# Patient Record
Sex: Female | Born: 1950 | Race: White | Hispanic: No | Marital: Married | Smoking: Former smoker
Health system: Southern US, Community
[De-identification: ages and names within clinical notes are randomized; demographics above are authoritative.]

## PROBLEM LIST (undated history)

## (undated) DIAGNOSIS — G43909 Migraine, unspecified, not intractable, without status migrainosus: Secondary | ICD-10-CM

## (undated) DIAGNOSIS — I10 Essential (primary) hypertension: Secondary | ICD-10-CM

## (undated) DIAGNOSIS — K219 Gastro-esophageal reflux disease without esophagitis: Secondary | ICD-10-CM

## (undated) DIAGNOSIS — J45909 Unspecified asthma, uncomplicated: Secondary | ICD-10-CM

## (undated) DIAGNOSIS — D229 Melanocytic nevi, unspecified: Secondary | ICD-10-CM

## (undated) DIAGNOSIS — F32A Depression, unspecified: Secondary | ICD-10-CM

## (undated) DIAGNOSIS — M1990 Unspecified osteoarthritis, unspecified site: Secondary | ICD-10-CM

## (undated) DIAGNOSIS — G4700 Insomnia, unspecified: Secondary | ICD-10-CM

## (undated) HISTORY — PX: BREAST SURGERY: SHX581

## (undated) HISTORY — DX: Essential (primary) hypertension: I10

## (undated) HISTORY — PX: BACK SURGERY: SHX140

## (undated) HISTORY — PX: ABDOMINAL HYSTERECTOMY: SHX81

## (undated) HISTORY — DX: Insomnia, unspecified: G47.00

## (undated) HISTORY — PX: NECK SURGERY: SHX720

---
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
## 1998-05-16 ENCOUNTER — Ambulatory Visit (HOSPITAL_COMMUNITY): Admission: RE | Admit: 1998-05-16 | Discharge: 1998-05-16 | Payer: Self-pay | Admitting: Obstetrics and Gynecology

## 1998-11-14 ENCOUNTER — Other Ambulatory Visit: Admission: RE | Admit: 1998-11-14 | Discharge: 1998-11-14 | Payer: Self-pay | Admitting: General Surgery

## 1999-03-15 ENCOUNTER — Encounter: Payer: Self-pay | Admitting: General Surgery

## 1999-03-15 ENCOUNTER — Ambulatory Visit (HOSPITAL_COMMUNITY): Admission: RE | Admit: 1999-03-15 | Discharge: 1999-03-15 | Payer: Self-pay | Admitting: General Surgery

## 2000-01-14 ENCOUNTER — Other Ambulatory Visit: Admission: RE | Admit: 2000-01-14 | Discharge: 2000-01-14 | Payer: Self-pay | Admitting: Obstetrics and Gynecology

## 2000-05-15 ENCOUNTER — Ambulatory Visit (HOSPITAL_COMMUNITY): Admission: RE | Admit: 2000-05-15 | Discharge: 2000-05-15 | Payer: Self-pay | Admitting: Obstetrics and Gynecology

## 2000-05-15 ENCOUNTER — Encounter: Payer: Self-pay | Admitting: Obstetrics and Gynecology

## 2000-07-02 ENCOUNTER — Emergency Department (HOSPITAL_COMMUNITY): Admission: EM | Admit: 2000-07-02 | Discharge: 2000-07-02 | Payer: Self-pay | Admitting: Emergency Medicine

## 2000-07-02 ENCOUNTER — Encounter: Payer: Self-pay | Admitting: Emergency Medicine

## 2000-08-04 HISTORY — PX: REDUCTION MAMMAPLASTY: SUR839

## 2001-06-10 ENCOUNTER — Ambulatory Visit (HOSPITAL_COMMUNITY): Admission: RE | Admit: 2001-06-10 | Discharge: 2001-06-10 | Payer: Self-pay | Admitting: Obstetrics and Gynecology

## 2001-06-10 ENCOUNTER — Encounter: Payer: Self-pay | Admitting: Obstetrics and Gynecology

## 2002-05-11 ENCOUNTER — Other Ambulatory Visit: Admission: RE | Admit: 2002-05-11 | Discharge: 2002-05-11 | Payer: Self-pay | Admitting: Obstetrics and Gynecology

## 2002-06-15 ENCOUNTER — Ambulatory Visit (HOSPITAL_COMMUNITY): Admission: RE | Admit: 2002-06-15 | Discharge: 2002-06-15 | Payer: Self-pay | Admitting: Obstetrics and Gynecology

## 2002-06-15 ENCOUNTER — Encounter: Payer: Self-pay | Admitting: Obstetrics and Gynecology

## 2003-04-21 ENCOUNTER — Ambulatory Visit (HOSPITAL_BASED_OUTPATIENT_CLINIC_OR_DEPARTMENT_OTHER): Admission: RE | Admit: 2003-04-21 | Discharge: 2003-04-21 | Payer: Self-pay | Admitting: *Deleted

## 2003-04-21 ENCOUNTER — Encounter (INDEPENDENT_AMBULATORY_CARE_PROVIDER_SITE_OTHER): Payer: Self-pay | Admitting: Specialist

## 2003-06-21 ENCOUNTER — Ambulatory Visit (HOSPITAL_COMMUNITY): Admission: RE | Admit: 2003-06-21 | Discharge: 2003-06-21 | Payer: Self-pay | Admitting: Obstetrics and Gynecology

## 2003-10-04 ENCOUNTER — Emergency Department (HOSPITAL_COMMUNITY): Admission: EM | Admit: 2003-10-04 | Discharge: 2003-10-04 | Payer: Self-pay | Admitting: Emergency Medicine

## 2003-10-21 ENCOUNTER — Emergency Department (HOSPITAL_COMMUNITY): Admission: EM | Admit: 2003-10-21 | Discharge: 2003-10-22 | Payer: Self-pay | Admitting: *Deleted

## 2004-08-12 ENCOUNTER — Other Ambulatory Visit: Admission: RE | Admit: 2004-08-12 | Discharge: 2004-08-12 | Payer: Self-pay | Admitting: Obstetrics and Gynecology

## 2006-06-16 ENCOUNTER — Encounter: Admission: RE | Admit: 2006-06-16 | Discharge: 2006-06-16 | Payer: Self-pay | Admitting: Obstetrics and Gynecology

## 2007-03-04 ENCOUNTER — Encounter: Admission: RE | Admit: 2007-03-04 | Discharge: 2007-03-04 | Payer: Self-pay | Admitting: Internal Medicine

## 2007-07-12 ENCOUNTER — Ambulatory Visit (HOSPITAL_BASED_OUTPATIENT_CLINIC_OR_DEPARTMENT_OTHER): Admission: RE | Admit: 2007-07-12 | Discharge: 2007-07-13 | Payer: Self-pay | Admitting: Specialist

## 2007-07-12 ENCOUNTER — Encounter (INDEPENDENT_AMBULATORY_CARE_PROVIDER_SITE_OTHER): Payer: Self-pay | Admitting: Specialist

## 2008-07-18 ENCOUNTER — Encounter: Admission: RE | Admit: 2008-07-18 | Discharge: 2008-07-18 | Payer: Self-pay | Admitting: Surgery

## 2008-07-19 ENCOUNTER — Ambulatory Visit (HOSPITAL_BASED_OUTPATIENT_CLINIC_OR_DEPARTMENT_OTHER): Admission: RE | Admit: 2008-07-19 | Discharge: 2008-07-19 | Payer: Self-pay | Admitting: Surgery

## 2008-07-19 ENCOUNTER — Encounter (INDEPENDENT_AMBULATORY_CARE_PROVIDER_SITE_OTHER): Payer: Self-pay | Admitting: Surgery

## 2009-05-15 ENCOUNTER — Ambulatory Visit (HOSPITAL_COMMUNITY): Admission: RE | Admit: 2009-05-15 | Discharge: 2009-05-15 | Payer: Self-pay | Admitting: Obstetrics and Gynecology

## 2009-10-21 ENCOUNTER — Emergency Department (HOSPITAL_COMMUNITY): Admission: EM | Admit: 2009-10-21 | Discharge: 2009-10-21 | Payer: Self-pay | Admitting: Emergency Medicine

## 2009-11-19 DIAGNOSIS — D229 Melanocytic nevi, unspecified: Secondary | ICD-10-CM

## 2009-11-19 HISTORY — DX: Melanocytic nevi, unspecified: D22.9

## 2010-03-14 ENCOUNTER — Encounter (INDEPENDENT_AMBULATORY_CARE_PROVIDER_SITE_OTHER): Payer: Self-pay | Admitting: *Deleted

## 2010-05-08 ENCOUNTER — Emergency Department (HOSPITAL_BASED_OUTPATIENT_CLINIC_OR_DEPARTMENT_OTHER): Admission: EM | Admit: 2010-05-08 | Discharge: 2010-05-08 | Payer: Self-pay | Admitting: Emergency Medicine

## 2010-05-08 ENCOUNTER — Ambulatory Visit: Payer: Self-pay | Admitting: Diagnostic Radiology

## 2010-05-30 ENCOUNTER — Encounter: Admission: RE | Admit: 2010-05-30 | Discharge: 2010-05-30 | Payer: Self-pay | Admitting: Internal Medicine

## 2010-06-03 ENCOUNTER — Encounter
Admission: RE | Admit: 2010-06-03 | Discharge: 2010-07-18 | Payer: Self-pay | Source: Home / Self Care | Attending: Internal Medicine | Admitting: Internal Medicine

## 2010-07-12 ENCOUNTER — Ambulatory Visit (HOSPITAL_COMMUNITY)
Admission: RE | Admit: 2010-07-12 | Discharge: 2010-07-12 | Payer: Self-pay | Source: Home / Self Care | Attending: Obstetrics and Gynecology | Admitting: Obstetrics and Gynecology

## 2010-08-24 ENCOUNTER — Encounter: Payer: Self-pay | Admitting: Obstetrics and Gynecology

## 2010-09-05 NOTE — Letter (Signed)
Summary: Colonoscopy Letter  [facility]  [address] [place], [place] [address]   Phone: [phone]  Fax: [phone]      March 14, 2010 MRN: [id number]   CASEE KNEPP  [address] [place], [place]  [address]   Dear Ms. Oguin ,   According to your medical record, it is time for you to schedule a Colonoscopy. The American Cancer Society recommends this procedure as a method to detect early colon cancer. Patients with a family history of colon cancer, or a personal history of colon polyps or inflammatory bowel disease are at increased risk.  This letter has beeen generated based on the recommendations made at the time of your procedure. If you feel that in your particular situation this may no longer apply, please contact our office.  Please call our office at [phone] to schedule this appointment or to update your records at your earliest convenience.  Thank you for cooperating with [place] to provide you with the very best care possible.   Sincerely,  Judie Petit T. Russella Dar, M.D.  [facility] [phone]

## 2010-10-28 ENCOUNTER — Other Ambulatory Visit (HOSPITAL_BASED_OUTPATIENT_CLINIC_OR_DEPARTMENT_OTHER): Payer: Self-pay | Admitting: Internal Medicine

## 2010-10-28 DIAGNOSIS — M25562 Pain in left knee: Secondary | ICD-10-CM

## 2010-10-30 ENCOUNTER — Ambulatory Visit (HOSPITAL_BASED_OUTPATIENT_CLINIC_OR_DEPARTMENT_OTHER)
Admission: RE | Admit: 2010-10-30 | Discharge: 2010-10-30 | Disposition: A | Discharge status: Home / Self Care | Payer: BC Managed Care – PPO | Source: Ambulatory Visit | Attending: Internal Medicine | Admitting: Internal Medicine

## 2010-10-30 DIAGNOSIS — M25569 Pain in unspecified knee: Secondary | ICD-10-CM

## 2010-10-30 DIAGNOSIS — M25562 Pain in left knee: Secondary | ICD-10-CM

## 2010-12-17 NOTE — Op Note (Signed)
NAME:  Kristina Mclaughlin, Kristina Mclaughlin              ACCOUNT NO.:  [id number]   MEDICAL RECORD NO.:  [id number]          PATIENT TYPE:  AMB   LOCATION:  DSC                          FACILITY:  [facility]   PHYSICIAN:  Earvin Hansen L. Trues[place], M.D.DATE OF BIRTH:  10-23-1950   DATE OF PROCEDURE:  07/12/2007  DATE OF DISCHARGE:                               OPERATIVE REPORT   INDICATIONS FOR PROCEDURE:  This 60 year old lady with severe  macromastia, back and shoulder pain, secondary to large pendulous  breasts.  A history of inter-triginous changes under both breast areas.  The right breast seems to be larger. She has more symptoms on that side  as well.  Pitting of both shoulders.  The patient also demonstrates  accessory breast tissue on the right and left side, the right side  greater than the left, in the high axillary areas, as well as the  latissimus dorsi regions, causing increased chaffing and discomfort.   PROCEDURES PLANNED:  Bilateral breast reductions using the inferior  pedicle technique and excision of accessory breast tissue.   ANESTHESIA:  General anesthesia.   SURGEON:  Yaakov Guthrie. Shon Hough, M.D.   DESCRIPTION OF PROCEDURE:  Preoperatively the patient was set up and  drawn for the inferior pedicle reduction mammoplasty.  Remarking the  nipple/areolar complex to back to 22 cm from the suprasternal notch.  She measured over 36 on the right side and only 34 on the left.  She  underwent general anesthesia and was intubated orally.  Prep was done to  the chest, breasts and abdominal areas using Hibiclens, soap and  solution, and walled off with sterile towels and drapes, so as to make a  sterile field.  The areas were injected with Xylocaine 0.25%, 1:400,000  concentration, a total of 150 mL per side.  This was allowed to set up.  The wounds were scored with a #15 blade and then the skin over the  inferior pedicle was de-epithelialized with #20 blades.  Medial and  lateral fatty dermal pedicles  were incised down to the underlying  pectoralis major fascia.  Hemostasis maintained again with the Bovie  unit on coagulation.  A new key hole area was also debulked and also  accessory breast tissue and lateral breast tissue was removed sharply  using a Bovie unit on coagulation.  Hemostasis again was maintained with  the same apparatus.  More accessory breast tissue was taken deep in the  axillary area and the latissimus dorsi  region.  Irrigation was done  after proper hemostasis.  The flaps were transposed and stayed with #3-0  Prolene.  The subcutaneous closure was done with #3-0 Monocryl x2  layers.  Then the skin edges were reapproximated with running  subcuticular stitches of #3-0 Monocryl and #5-0 Monocryl throughout the  inverted T.  At the end of the procedure the nipple/areolar complex was  re-examined and showed good suppleness and excellent return and  symmetry.  The wounds were cleansed.  Steri-Strips and a soft dressing  were applied, 4 x 4's, ABDs and Hypafix tape.   She was then taken to the recovery room in  excellent condition.  The  estimated blood loss was less than 150 mL.  Complications:  None.      Yaakov Guthrie. Shon Hough, M.D.  Electronically Signed     GLT/MEDQ  D:  07/12/2007  T:  07/12/2007  Job:  [id number]

## 2010-12-17 NOTE — Op Note (Signed)
NAME:  Kristina Mclaughlin, Kristina Mclaughlin              ACCOUNT NO.:  [id number]   MEDICAL RECORD NO.:  [id number]          PATIENT TYPE:  AMB   LOCATION:  DSC                          FACILITY:  [facility]   PHYSICIAN:  Thomas A. Cornett, M.D.DATE OF BIRTH:  09-Sep-1950   DATE OF PROCEDURE:  07/19/2008  DATE OF DISCHARGE:                               OPERATIVE REPORT   PREOPERATIVE DIAGNOSIS:  1. Mass, left mid back.  2. Mass, right lower leg.   POSTOPERATIVE DIAGNOSIS:  1. Lipoma, left back measuring 6 x 6 cm.  2. Lipoma, right leg measuring 3 x 4 cm.   PROCEDURE:  1. Excision of mass, left back.  2. Excision of mass, right leg.   SURGEON:  Maisie Fus A. Cornett, MD   ANESTHESIA:  General endotracheal anesthesia with 0.25% Sensorcaine  local.   ESTIMATED BLOOD LOSS:  20 mL.   SPECIMEN:  Tissue as stated above.   DRAINS:  None.   INDICATIONS FOR PROCEDURE:  The patient is a 60 year old female with a  history of lipoma.  She has had multiple lipoma removed, but now has one  on her left mid back, which is quite large and uncomfortable and one on  her right leg just below her knee over the medial aspect of the right  lower extremity.  Both are causing discomfort and she wished to have  them removed based on that.  She presents today for that.   DESCRIPTION OF PROCEDURE:  The patient was brought to the operating room  where she was placed supine initially.  She was intubated and placed  prone to get to the back lesion.  The left back was then prepped and  draped in sterile fashion after putting the patient prone with  appropriate padding.  She received appropriate preop antibiotics.  An  incision was made over the left mid back.  The mass actually was bigger  than what it felt on physical examination.  It was a large fatty lobular  mass and all the way down to the musculature of the left upper back.  Roughly, a 6 x 6 cm mass was removed which had the appearance of a  simple lipoma.  This was taken  all the way down to the fascia of the  latissimus dorsi muscle.  This was passed off the field.  The wound was  irrigated and found to be hemostatic.  I used a deep layer of 3-0 Vicryl  to attach the skin down to the muscle.  A 4-0 Monocryl was then used to  close the skin in a subcuticular fashion.  Dermabond was applied.  This  was allowed to dry.  She was then placed supine and placed on a  stretcher.  The right lower extremity was then appropriately padded, and  the lesion that was on the medial aspect of her right lower leg just  approximately halfway between her knee and ankle was identified.  This  was prepped and draped in a sterile fashion.  This was much smaller and  felt more to be 3 x 3 cm.  A vertical incision over it  was placed.  We  immediately got into a lobular fatty mass consisting of lipoma and I  removed this.  There, I felt around it, I felt no other mass.  Her fat  had a lobular feel to it around this, but there was no discrete mass or  encapsulated mass consistent with lipoma besides the area we took out.  This came out in a piecemeal fashion.  I did not encounter the saphenous  vein here or the saphenous nerve.  We then irrigated this area out and  closed it with a 3-0 Vicryl and subsequently 4-0 Monocryl.  Dermabond  was applied.  The specimens were sent to pathology but appeared to be  simple lipomas.  The patient was then awakened, final counts were found  to be correct, and she was then taken to recovery in satisfactory  condition.      Thomas A. Cornett, M.D.  Electronically Signed     TAC/MEDQ  D:  07/19/2008  T:  07/19/2008  Job:  [id number]

## 2010-12-20 NOTE — Op Note (Signed)
NAME:  Kristina Mclaughlin, Kristina Mclaughlin                        ACCOUNT NO.:  [id number]   MEDICAL RECORD NO.:  [id number]                   PATIENT TYPE:  AMB   LOCATION:  DSC                                  FACILITY:  [facility]   PHYSICIAN:  Kathy Breach, M.D.                   DATE OF BIRTH:  1951/05/25   DATE OF PROCEDURE:  04/21/2003  DATE OF DISCHARGE:                                 OPERATIVE REPORT   PREOPERATIVE DIAGNOSES:  1. Severe deviation of the nasal septum.  2. Compensatory hyperplasia of the inferior turbinates.   PROCEDURE:  1. Nasoseptoplasty.  2. Bilateral submucous resection of the inferior turbinates.   POSTOPERATIVE DIAGNOSES:  1. Severe deviation of the nasal septum.  2. Compensatory hyperplasia of the inferior turbinates.   SURGEON:  Kathy Breach, M.D.   ANESTHESIA:  General orotracheal.   DESCRIPTION OF PROCEDURE:  With the patient under general orotracheal  anesthesia, his nose was prepped and draped in a sterile fashion. Nasal  block anesthesia was applied with 4% Xylocaine/ephedrine solution on olive-  tipped probes applied to the sphenopalatine and anterior ethmoid nerve areas  bilaterally. Cotton pledgets soaked with a similar solution inserted along  the middle and inferior turbinates.  The columnella, nasal septum, and later  in the procedure, inferior turbinates infiltrated with 1% Xylocaine with  1:100,000 epinephrine for further vasoconstrictive effort.   Examination of the patient's nose after decongestant revealed 4+ deflection  of the maxillary crest inferior quadrilateral cartilage to the left  obstructing the visualization of the inferior turbinate extending into the  vomer ridge spur on the left further posteriorly. There was marked  thickening and widening evident of the junction of the quadrilateral  cartilage with the perpendicular plate of the ethmoid bilaterally. The  patient had marked compensatory hyperplasia of the right middle and inferior  turbinates.   A caudal incision was made inside the left nasal vestibule. Mucosal flaps  were elevated off the left side of the cartilaginous bony septum with some  difficulty in duplication. The cartilage was incised anterior to the  junction with the perpendicular ethmoid plate because of marked deviation  and mucosal flaps elevated on the opposite right side of all the nasal  septum except for the anteroseptal strut of the quadrilateral cartilage. The  posterior superior quadrilateral cartilage along with the deflected and very  widened perpendicular plate of the ethmoid was removed and inferiorly the  posterior aspect of the quadrilateral cartilage deflection and dislocated  off the maxillary crest was removed.   The sharply deflected maxillary crest to the left was taken down and  removed, and the vomer ridge spur to the left further posteriorly was  removed. This brought the nasal septum into a near midline position. The  left turbinates could then be visualized after straightening the marked  deflection and the posterior two-thirds of the inferior turbinate on the  left side were hyperplastic as well as a very large hyperplastic prominent  middle turbinate.   A stab incision was made over the anterior aspect of the left inferior  turbinate and a superior based mucosal flap elevated off the turbinate bone,  the prominence of turbinate bone more posteriorly, the lower half along with  a patched free margin and inferior meatal mucosa was excised with angled  scissors. Suction cautery was used to obtain complete hemostasis along the  bony mucosal margins. Two-thirds of the posterior extension were removed and  ablated with cautery as well.   A similar procedure was done on the right side. The remaining inferior  turbinate bones were out-fractured bilaterally. The very prominent middle  turbinates were gently out-fractured bilaterally. This gave good visible  airway bilaterally. The  incision was then closed with interrupted 4-0  chromic catgut sutures. The nose was packed with Vaseline gauze impregnated  with bacitracin ointment bilaterally. The patient's nasopharynx and oral  cavity were suctioned clear. There was no active bleeding in the  nasopharynx. Blood loss during the procedure was 100 mL as measured in the  suction cannister.   The patient tolerated the procedure well and was taken to the recovery room  in stable general condition.                                               Kathy Breach, M.D.    Venia Minks  D:  04/21/2003  T:  04/22/2003  Job:  [id number]

## 2011-04-15 ENCOUNTER — Other Ambulatory Visit (HOSPITAL_COMMUNITY): Payer: Self-pay | Admitting: Obstetrics and Gynecology

## 2011-04-15 DIAGNOSIS — Z1231 Encounter for screening mammogram for malignant neoplasm of breast: Secondary | ICD-10-CM

## 2011-05-09 LAB — BASIC METABOLIC PANEL
BUN: 14 mg/dL (ref 6–23)
CO2: 29 mEq/L (ref 19–32)
Calcium: 9.8 mg/dL (ref 8.4–10.5)
Chloride: 103 mEq/L (ref 96–112)
Creatinine, Ser: 0.72 mg/dL (ref 0.4–1.2)
GFR calc Af Amer: >60 mL/min (ref >60)
GFR calc non Af Amer: >60 mL/min (ref >60)
Glucose, Bld: 107 mg/dL — ABNORMAL HIGH (ref 70–99)
Potassium: 4 mEq/L (ref 3.5–5.1)
Sodium: 139 mEq/L (ref 135–145)

## 2011-05-09 LAB — DIFFERENTIAL
Basophils Absolute: 0 10*3/uL (ref 0.0–0.1)
Basophils Relative: 1 % (ref 0–1)
Eosinophils Absolute: 0.2 10*3/uL (ref 0.0–0.7)
Eosinophils Relative: 3 % (ref 0–5)
Lymphocytes Relative: 49 % — ABNORMAL HIGH (ref 12–46)
Lymphs Abs: 3 10*3/uL (ref 0.7–4.0)
Monocytes Absolute: 0.5 10*3/uL (ref 0.1–1.0)
Monocytes Relative: 9 % (ref 3–12)
Neutro Abs: 2.4 10*3/uL (ref 1.7–7.7)
Neutrophils Relative %: 39 % — ABNORMAL LOW (ref 43–77)

## 2011-05-09 LAB — CBC
HCT: 40.1 % (ref 36.0–46.0)
Hemoglobin: 13.7 g/dL (ref 12.0–15.0)
MCHC: 34.1 g/dL (ref 30.0–36.0)
MCV: 87.8 fL (ref 78.0–100.0)
Platelets: 188 10*3/uL (ref 150–400)
RBC: 4.57 MIL/uL (ref 3.87–5.11)
RDW: 13.8 % (ref 11.5–15.5)
WBC: 6.1 10*3/uL (ref 4.0–10.5)

## 2011-05-12 LAB — POCT HEMOGLOBIN-HEMACUE
Hemoglobin: 15.3 — ABNORMAL HIGH
Operator id: 12362

## 2011-05-12 LAB — BASIC METABOLIC PANEL
BUN: 16
CO2: 25
Calcium: 9.4
Chloride: 105
Creatinine, Ser: 0.93
GFR calc Af Amer: >60
GFR calc non Af Amer: >60
Glucose, Bld: 116 — ABNORMAL HIGH
Potassium: 3.8
Sodium: 137

## 2011-07-21 ENCOUNTER — Ambulatory Visit (HOSPITAL_COMMUNITY)
Admission: RE | Admit: 2011-07-21 | Discharge: 2011-07-21 | Disposition: A | Discharge status: Home / Self Care | Payer: BC Managed Care – PPO | Source: Ambulatory Visit | Attending: Obstetrics and Gynecology | Admitting: Obstetrics and Gynecology

## 2011-07-21 DIAGNOSIS — Z1231 Encounter for screening mammogram for malignant neoplasm of breast: Secondary | ICD-10-CM

## 2012-04-07 ENCOUNTER — Encounter: Payer: Self-pay | Admitting: Gastroenterology

## 2012-06-28 ENCOUNTER — Other Ambulatory Visit (HOSPITAL_COMMUNITY): Payer: Self-pay | Admitting: Obstetrics and Gynecology

## 2012-06-28 DIAGNOSIS — Z1231 Encounter for screening mammogram for malignant neoplasm of breast: Secondary | ICD-10-CM

## 2012-07-15 ENCOUNTER — Encounter (INDEPENDENT_AMBULATORY_CARE_PROVIDER_SITE_OTHER): Payer: Self-pay | Admitting: General Surgery

## 2012-07-20 ENCOUNTER — Encounter (INDEPENDENT_AMBULATORY_CARE_PROVIDER_SITE_OTHER): Payer: Self-pay | Admitting: General Surgery

## 2012-07-22 ENCOUNTER — Encounter (INDEPENDENT_AMBULATORY_CARE_PROVIDER_SITE_OTHER): Payer: Self-pay | Admitting: General Surgery

## 2012-07-22 ENCOUNTER — Ambulatory Visit (INDEPENDENT_AMBULATORY_CARE_PROVIDER_SITE_OTHER): Payer: BC Managed Care – PPO | Admitting: General Surgery

## 2012-07-22 VITALS — BP 124/78 | HR 72 | Temp 97.3°F | Resp 16 | Ht 69.5 in | Wt 226.2 lb

## 2012-07-22 DIAGNOSIS — K648 Other hemorrhoids: Secondary | ICD-10-CM

## 2012-07-22 DIAGNOSIS — K644 Residual hemorrhoidal skin tags: Secondary | ICD-10-CM

## 2012-07-22 NOTE — Patient Instructions (Signed)
Call our office to schedule surgery [phone]  GETTING TO GOOD BOWEL HEALTH. Irregular bowel habits such as constipation and diarrhea can lead to many problems over time.  Having one soft bowel movement a day is the most important way to prevent further problems.  The anorectal canal is designed to handle stretching and feces to safely manage our ability to get rid of solid waste (feces, poop, stool) out of our body.  BUT, hard constipated stools can act like ripping concrete bricks and diarrhea can be a burning fire to this very sensitive area of our body, causing inflamed hemorrhoids, anal fissures, increasing risk is perirectal abscesses, abdominal pain/bloating, an making irritable bowel worse.     The goal: ONE SOFT BOWEL MOVEMENT A DAY!  To have soft, regular bowel movements:    Drink at least 8 tall glasses of water a day.     Take plenty of fiber.  Fiber is the undigested part of plant food that passes into the colon, acting s "natures broom" to encourage bowel motility and movement.  Fiber can absorb and hold large amounts of water. This results in a larger, bulkier stool, which is soft and easier to pass. Work gradually over several weeks up to 6 servings a day of fiber (25g a day even more if needed) in the form of: o Vegetables -- Root (potatoes, carrots, turnips), leafy green (lettuce, salad greens, celery, spinach), or cooked high residue (cabbage, broccoli, etc) o Fruit -- Fresh (unpeeled skin & pulp), Dried (prunes, apricots, cherries, etc ),  or stewed ( applesauce)  o Whole grain breads, pasta, etc (whole wheat)  o Bran cereals    Bulking Agents -- This type of water-retaining fiber generally is easily obtained each day by one of the following:  o Psyllium bran -- The psyllium plant is remarkable because its ground seeds can retain so much water. This product is available as Metamucil, Konsyl, Effersyllium, Per Diem Fiber, or the less expensive generic preparation in drug and health  food stores. Although labeled a laxative, it really is not a laxative.  o Methylcellulose -- This is another fiber derived from wood which also retains water. It is available as Citrucel. o Polyethylene Glycol - and "artificial" fiber commonly called Miralax or Glycolax.  It is helpful for people with gassy or bloated feelings with regular fiber o Flax Seed - a less gassy fiber than psyllium   No reading or other relaxing activity while on the toilet. If bowel movements take longer than 5 minutes, you are too constipated   AVOID CONSTIPATION.  High fiber and water intake usually takes care of this.  Sometimes a laxative is needed to stimulate more frequent bowel movements, but    Laxatives are not a good long-term solution as it can wear the colon out. o Osmotics (Milk of Magnesia, Fleets phosphosoda, Magnesium citrate, MiraLax, GoLytely) are safer than  o Stimulants (Senokot, Castor Oil, Dulcolax, Ex Lax)    o Do not take laxatives for more than 7days in a row.    IF SEVERELY CONSTIPATED, try a Bowel Retraining Program: o Do not use laxatives.  o Eat a diet high in roughage, such as bran cereals and leafy vegetables.  o Drink six (6) ounces of prune or apricot juice each morning.  o Eat two (2) large servings of stewed fruit each day.  o Take one (1) heaping tablespoon of a psyllium-based bulking agent twice a day. Use sugar-free sweetener when possible to avoid excessive calories.  o Eat a normal breakfast.  o Set aside 15 minutes after breakfast to sit on the toilet, but do not strain to have a bowel movement.  o If you do not have a bowel movement by the third day, use an enema and repeat the above steps.    Controlling diarrhea o Switch to liquids and simpler foods for a few days to avoid stressing your intestines further. o Avoid dairy products (especially milk & ice cream) for a short time.  The intestines often can lose the ability to digest lactose when stressed. o Avoid foods that  cause gassiness or bloating.  Typical foods include beans and other legumes, cabbage, broccoli, and dairy foods.  Every person has some sensitivity to other foods, so listen to our body and avoid those foods that trigger problems for you. o Adding fiber (Citrucel, Metamucil, psyllium, Miralax) gradually can help thicken stools by absorbing excess fluid and retrain the intestines to act more normally.  Slowly increase the dose over a few weeks.  Too much fiber too soon can backfire and cause cramping & bloating. o Probiotics (such as active yogurt, Align, etc) may help repopulate the intestines and colon with normal bacteria and calm down a sensitive digestive tract.  Most studies show it to be of mild help, though, and such products can be costly. o Medicines:   Bismuth subsalicylate (ex. Kayopectate, Pepto Bismol) every 30 minutes for up to 6 doses can help control diarrhea.  Avoid if pregnant.   Loperamide (Immodium) can slow down diarrhea.  Start with two tablets (4mg  total) first and then try one tablet every 6 hours.  Avoid if you are having fevers or severe pain.  If you are not better or start feeling worse, stop all medicines and call your doctor for advice o Call your doctor if you are getting worse or not better.  Sometimes further testing (cultures, endoscopy, X-ray studies, bloodwork, etc) may be needed to help diagnose and treat the cause of the diarrhea. o

## 2012-07-22 NOTE — Progress Notes (Signed)
Patient ID: Kristina Mclaughlin, female   DOB: October 27, 1950, 61 y.o.   MRN: [id number]  Chief Complaint  Patient presents with  . Hemorrhoids    HPI Kristina Mclaughlin is a 61 y.o. female.   HPI 61 yo WF referred by Dr Bosie Clos for evaluation of hemorrhoids. The patient states that she has had problems on and off with hemorrhoids for several years however over the past couple months she has been having more frequent problems. She states that her problems mainly consists of bleeding as well as painful hemorrhoids. She states that she will have bouts of hemorrhoidal flares. She states that she generally has a bowel movement at least every 2 days. She sits on the commode for 5-10 minutes at a time. She does do some straining. She drinks about 6 glasses of water a day. She does not take any supplemental fiber. She denies any incontinence. She states that about twice a month to 3 times a month and she has a bowel movement it'll feel like a hot poker. She will have bleeding on the toilet tissue. She had a colonoscopy around 2009 which she reports was normal. She has tried sitz baths, preparation H., and Tucks pads without much relief. She states that the left side seems to be more problematic than the right. She states that the hemorrhoid on the left will generally come out but it'll spontaneously reduce. No past medical history on file.  Past Surgical History  Procedure Date  . Breast surgery     biopsy x2; reduction  . Abdominal hysterectomy   . Back surgery     Family History  Problem Relation Age of Onset  . Bladder Cancer Mother     Social History History  Substance Use Topics  . Smoking status: Former Games developer  . Smokeless tobacco: Not on file  . Alcohol Use: No    Allergies  Allergen Reactions  . Quinolones Hives and Shortness Of Breath    Current Outpatient Prescriptions  Medication Sig Dispense Refill  . bisoprolol (ZEBETA) 5 MG tablet       . Calcium Carbonate-Vitamin D (CALCIUM +  D PO) Take by mouth.      . DEXILANT 60 MG capsule       . fish oil-omega-3 fatty acids 1000 MG capsule Take 2 g by mouth daily.      . Multiple Vitamin (MULTI-VITAMIN PO) Take by mouth.      Marland Kitchen NEXIUM 40 MG capsule       . Probiotic Product (PROBIOTIC DAILY PO) Take by mouth.      . temazepam (RESTORIL) 15 MG capsule       . valsartan-hydrochlorothiazide (DIOVAN-HCT) 320-25 MG per tablet         Review of Systems Review of Systems  Constitutional: Negative for fever, chills and unexpected weight change.  HENT: Negative for hearing loss, congestion, sore throat, trouble swallowing and voice change.   Eyes: Negative for visual disturbance.  Respiratory: Negative for cough and wheezing.   Cardiovascular: Negative for chest pain, palpitations and leg swelling.       Denies CP, SOB, PND  Gastrointestinal: Negative for nausea, abdominal pain, diarrhea, abdominal distention and anal bleeding.  Genitourinary: Negative for hematuria, vaginal bleeding and difficulty urinating.  Musculoskeletal: Negative for arthralgias.  Skin: Negative for rash and wound.  Neurological: Negative for seizures, syncope and headaches.  Hematological: Negative for adenopathy. Does not bruise/bleed easily.  Psychiatric/Behavioral: Negative for confusion.    Blood pressure 124/78, pulse  72, temperature 97.3 F (36.3 C), resp. rate 16, height 5' 9.5" (1.765 m), weight 226 lb 3.2 oz (102.604 kg).  Physical Exam Physical Exam  Vitals reviewed. Constitutional: She is oriented to person, place, and time. She appears well-developed and well-nourished. No distress.       overweight  HENT:  Head: Normocephalic and atraumatic.  Right Ear: External ear normal.  Left Ear: External ear normal.  Eyes: Conjunctivae normal are normal. No scleral icterus.  Neck: Neck supple. No tracheal deviation present. No thyromegaly present.       Well healed Kocher incision  Cardiovascular: Normal rate, regular rhythm and normal heart  sounds.   Pulmonary/Chest: Effort normal and breath sounds normal. No respiratory distress. She has no wheezes.  Abdominal: Soft. Bowel sounds are normal. She exhibits no distension. There is no tenderness.  Genitourinary: Rectal exam shows external hemorrhoid and internal hemorrhoid. Rectal exam shows no fissure and anal tone normal.       Left and right lateral int/ext hemorrhoids. Anoscopy - small rt ant int hemorrhoid; left/righ lateral int/ext hemorrhoid.   Musculoskeletal: She exhibits no edema.       Lower back vertical incision  Neurological: She is alert and oriented to person, place, and time. She exhibits normal muscle tone.  Skin: Skin is warm and dry. No rash noted. She is not diaphoretic. No erythema.  Psychiatric: She has a normal mood and affect. Her behavior is normal. Judgment and thought content normal.    Data Reviewed None available  Assessment    2 column internal/external hemorrhoids Right anterior internal hemorrhoid    Plan    We discussed the etiology of hemorrhoids. The patient was given educational material as well as diagrams. We discussed nonoperative and operative management of hemorrhoidal disease.  We discussed the importance of having a daily soft bowel movement and avoiding constipation. We also discussed good bowel habits such as not reading in the bathroom, not straining, and drinking 6-8 glasses of water per day. We also discussed the importance of a high fiber diet. We discussed foods that were high in fiber as well as fiber supplements. We discussed the importance of trying to get 25-30 g of fiber per day in their diet. We discussed the need to start with a low dose of fiber and then gradually increasing their daily fiber dose over several weeks in order to avoid bloating and cramping.  We then discussed different surgical techniques for hemorrhoids, specifically hemorrhoidal banding and excisional hemorrhoidectomy.  PLAN:  Exam under anesthesia,  excisional hemorrhoidectomy, possible hemorrhoidal banding  I discussed the procedure in detail.  The patient was given Agricultural engineer.  We discussed the risks and benefits of surgery including, but not limited to bleeding, infection, blood clot formation, anesthesia risk, urinary retention, hemorrhoid recurrence, injury to the sphincters resulting in incontinence, and the rare possibility of anal canal narrowing. I explained that the likelihood of improvement of their symptoms is good  We discussed the typical postoperative course.  I stressed the importance of not becoming constipated after surgery.  The patient was encouraged to limit pain medication if possible as this increases the likelihood of becoming constipated. The patient was advised to take stool softners & drink 8-10 glasses of non-carbonated, non-alcoholic beverages per day and to eat a high fiber diet.  I also encouraged soaking in a water warm bath for 15 minutes at a time several times a day and after a bowel movement.  The patient was advised to take  laxatives such as milk of magnesia or Miralax if no bowel movement three days after surgery.  The patient was advised to expect some blood tinged drainage as well as some blood in their bowel movements.   Mary Sella. Andrey Campanile, MD, FACS General, Bariatric, & Minimally Invasive Surgery [facility] Surgery, [place]         [facility] M 07/22/2012, 11:07 AM

## 2012-07-23 ENCOUNTER — Ambulatory Visit (HOSPITAL_COMMUNITY): Payer: BC Managed Care – PPO

## 2012-07-30 ENCOUNTER — Ambulatory Visit (HOSPITAL_COMMUNITY)
Admission: RE | Admit: 2012-07-30 | Discharge: 2012-07-30 | Disposition: A | Discharge status: Home / Self Care | Payer: BC Managed Care – PPO | Source: Ambulatory Visit | Attending: Obstetrics and Gynecology | Admitting: Obstetrics and Gynecology

## 2012-07-30 DIAGNOSIS — Z1231 Encounter for screening mammogram for malignant neoplasm of breast: Secondary | ICD-10-CM

## 2012-08-25 ENCOUNTER — Encounter: Payer: Self-pay | Admitting: Family Medicine

## 2012-08-27 ENCOUNTER — Other Ambulatory Visit (INDEPENDENT_AMBULATORY_CARE_PROVIDER_SITE_OTHER): Payer: Self-pay | Admitting: General Surgery

## 2012-08-27 DIAGNOSIS — K644 Residual hemorrhoidal skin tags: Secondary | ICD-10-CM

## 2012-08-27 DIAGNOSIS — K648 Other hemorrhoids: Secondary | ICD-10-CM

## 2012-08-27 HISTORY — PX: EXAMINATION UNDER ANESTHESIA: SHX1540

## 2012-09-03 ENCOUNTER — Telehealth (INDEPENDENT_AMBULATORY_CARE_PROVIDER_SITE_OTHER): Payer: Self-pay

## 2012-09-03 NOTE — Telephone Encounter (Signed)
Pt called wanting to know how long she can expect to have bloody spotting and soreness with BM. Pt advised it can take 2-3 weeks for spotting with BM to resolve. Pt advised soreness will gradually improve but is to also be expected for at least 2-3 weeks maybe longer. Pt encouraged to drink plenty of water and keep stools soft and regular. Pt to call with concerns.

## 2012-09-06 ENCOUNTER — Telehealth (INDEPENDENT_AMBULATORY_CARE_PROVIDER_SITE_OTHER): Payer: Self-pay | Admitting: General Surgery

## 2012-09-06 ENCOUNTER — Encounter (INDEPENDENT_AMBULATORY_CARE_PROVIDER_SITE_OTHER): Payer: Self-pay | Admitting: General Surgery

## 2012-09-06 NOTE — Telephone Encounter (Signed)
Pt called to request a RTW note.  Letter written and FAXd to Attn:  Merton Border at [phone].  Confirmation received.

## 2012-09-07 ENCOUNTER — Telehealth (INDEPENDENT_AMBULATORY_CARE_PROVIDER_SITE_OTHER): Payer: Self-pay | Admitting: General Surgery

## 2012-09-07 NOTE — Telephone Encounter (Signed)
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

## 2012-09-07 NOTE — Telephone Encounter (Signed)
I advised patient usually about 3 weeks after surgery she can go back- starting with no heavy lifting. Patient asked to have a note faxed for four weeks. Note faxed to patient's gym. Confirmation received. She will call with any other questions.

## 2012-09-13 ENCOUNTER — Telehealth (INDEPENDENT_AMBULATORY_CARE_PROVIDER_SITE_OTHER): Payer: Self-pay | Admitting: General Surgery

## 2012-09-13 NOTE — Telephone Encounter (Signed)
Pt called to report she had bleeding today for the first time since her surgery on her hems.  She also has returned to work and finding it painful to sit, even on a pillow.  Reassured her that recovery from hem surgery takes a long time and to continue to use the warm tub soaks as often as she can.  She understands and will comply.

## 2012-09-23 ENCOUNTER — Encounter (INDEPENDENT_AMBULATORY_CARE_PROVIDER_SITE_OTHER): Payer: Self-pay | Admitting: General Surgery

## 2012-09-23 ENCOUNTER — Ambulatory Visit (INDEPENDENT_AMBULATORY_CARE_PROVIDER_SITE_OTHER): Payer: BC Managed Care – PPO | Admitting: General Surgery

## 2012-09-23 VITALS — BP 130/82 | HR 72 | Resp 16 | Ht 69.0 in | Wt 228.0 lb

## 2012-09-23 DIAGNOSIS — Z09 Encounter for follow-up examination after completed treatment for conditions other than malignant neoplasm: Secondary | ICD-10-CM

## 2012-09-23 NOTE — Patient Instructions (Signed)
Continue taking the fiber - you can experiment with changing it to just twice a day You can continue to take the stool softner I would just take the Miralax as needed (for times when you get constipated)

## 2012-09-23 NOTE — Progress Notes (Signed)
Subjective:     Patient ID: Kristina Mclaughlin, female   DOB: [birth year], 62 y.o.   MRN: [id number]  HPI 62 year old Caucasian female comes in today for her first postoperative appointment after undergoing exam under anesthesia, excision of a large left-sided internal-external hemorrhoid as well as excision of a right lateral internal-external hemorrhoid on January 24. She states that the first 2 days after surgery she has had pain. She had her first bowel movement 2 days after surgery. Once the long-acting local wore off she started to have some discomfort in her anal area. Her postoperative regimen included Metamucil 3 times a day, daily stool softener, as well as daily MiraLAX. She states that she has been having daily bowel movements. She did have an episode of significant pain in her rectal area when she went back to work around February 10. However she states that she no longer has any pain. She denies any bleeding. She denies any dysuria. He states that she's very happy with her outcome. She states that it no longer feels like a hot poker when she has a bowel movement like she did preoperatively  Review of Systems     Objective:   Physical Exam BP 130/82  Pulse 72  Resp 16  Ht 5\' 9"  (1.753 m)  Wt 228 lb (103.42 kg)  BMI 33.65 kg/m2 Alert, no apparent distress Rectal-visual inspection only, healing left lateral and right lateral incisions. There is no cellulitis or induration. There is no edema of surrounding hemorrhoidal tissue. Digital rectal exam and anoscopy was deferred due to recent surgery    Assessment:     Status post exam under anesthesia, excision of large left lateral internal/external hemorrhoid as well as right lateral internal-external hemorrhoid January 24     Plan:     Overall I think she is doing great. I am very happy with her progress. I encouraged her to continue on the Metamucil indefinitely. She was encouraged to drink plenty of water on daily basis. I told her she  could continue to stool softener if she likes. I recommended that she only use the MiraLAX for times that she becomes constipated. Followup in 6-8 weeks for repeat exam  Mary Sella. Andrey Campanile, MD, FACS General, Bariatric, & Minimally Invasive Surgery [facility] Surgery, [place]

## 2012-11-11 ENCOUNTER — Encounter (INDEPENDENT_AMBULATORY_CARE_PROVIDER_SITE_OTHER): Payer: BC Managed Care – PPO | Admitting: General Surgery

## 2012-11-24 ENCOUNTER — Encounter (INDEPENDENT_AMBULATORY_CARE_PROVIDER_SITE_OTHER): Payer: Self-pay | Admitting: General Surgery

## 2012-11-24 ENCOUNTER — Ambulatory Visit (INDEPENDENT_AMBULATORY_CARE_PROVIDER_SITE_OTHER): Payer: BC Managed Care – PPO | Admitting: General Surgery

## 2012-11-24 VITALS — BP 110/70 | HR 60 | Resp 16 | Ht 68.5 in | Wt 229.0 lb

## 2012-11-24 DIAGNOSIS — Z09 Encounter for follow-up examination after completed treatment for conditions other than malignant neoplasm: Secondary | ICD-10-CM

## 2012-11-24 NOTE — Patient Instructions (Signed)
Keep up the good work with the water and metamucil Avoid harsh soaps to rectal area Avoid washclothes to that area Use wet wipes to clean after a bowel movement

## 2012-11-24 NOTE — Progress Notes (Signed)
Subjective:     Patient ID: Kristina Mclaughlin, female   DOB: [birth year], 62 y.o.   MRN: [id number]  HPI 62 year old Caucasian female comes in for followup after undergoing exam under anesthesia, open excision of left lateral internal-external hemorrhoid as well as open excision of right lateral internal-external hemorrhoid on January 24. I last saw her in the office on February 20. Since that time she states that she has been doing really well. She reports daily bowel movements. She is still taking Metamucil on a daily basis. She denies any bleeding. She states occasionally she will have some soreness on the left side. Generally it'll occur at the start of a bowel movement. She denies any incontinence. The itching she had has resolved  Review of Systems     Objective:   Physical Exam BP 110/70  Pulse 60  Resp 16  Ht 5' 8.5" (1.74 m)  Wt 229 lb (103.874 kg)  BMI 34.31 kg/m2 Alert, nad Rectal - very small non-thrombosed left ant and rt ant ext hemorrhoidal tissue. Left lateral incision almost healed. Open for about 1cm long x 1mm wide and very shallow. DRE and anoscopy deferred    Assessment:     S/p EUA, left and right open int/ext hemorrhoidectomy     Plan:     Overall I am very pleased with how she is doing. I encouraged her to continue with the Metamucil and drinking plenty water forever. Her incision is almost completely healed on the left. The right side has completely healed. We discussed avoiding using toilet paper and using wet wipes instead. I also encouraged her to avoid using a washcloth in the shower. We also discussed using a mild soap such as Dove. F/u 8 weeks.   Mary Sella. Andrey Campanile, MD, FACS General, Bariatric, & Minimally Invasive Surgery [facility] Surgery, [place]

## 2012-12-15 ENCOUNTER — Other Ambulatory Visit (INDEPENDENT_AMBULATORY_CARE_PROVIDER_SITE_OTHER): Payer: Self-pay | Admitting: Otolaryngology

## 2012-12-15 DIAGNOSIS — H903 Sensorineural hearing loss, bilateral: Secondary | ICD-10-CM

## 2012-12-15 DIAGNOSIS — H905 Unspecified sensorineural hearing loss: Secondary | ICD-10-CM

## 2012-12-18 ENCOUNTER — Ambulatory Visit
Admission: RE | Admit: 2012-12-18 | Discharge: 2012-12-18 | Disposition: A | Discharge status: Home / Self Care | Payer: BC Managed Care – PPO | Source: Ambulatory Visit | Attending: Otolaryngology | Admitting: Otolaryngology

## 2012-12-18 DIAGNOSIS — H903 Sensorineural hearing loss, bilateral: Secondary | ICD-10-CM

## 2012-12-18 DIAGNOSIS — H905 Unspecified sensorineural hearing loss: Secondary | ICD-10-CM

## 2012-12-18 MED ORDER — GADOBENATE DIMEGLUMINE 529 MG/ML IV SOLN
20.0000 mL | Freq: Once | INTRAVENOUS | Status: AC | PRN
  Administered 2012-12-18: 20 mL via INTRAVENOUS

## 2012-12-20 ENCOUNTER — Other Ambulatory Visit: Payer: BC Managed Care – PPO

## 2012-12-23 ENCOUNTER — Other Ambulatory Visit: Payer: BC Managed Care – PPO

## 2013-01-26 ENCOUNTER — Encounter (INDEPENDENT_AMBULATORY_CARE_PROVIDER_SITE_OTHER): Payer: BC Managed Care – PPO | Admitting: General Surgery

## 2013-02-23 ENCOUNTER — Encounter (INDEPENDENT_AMBULATORY_CARE_PROVIDER_SITE_OTHER): Payer: Self-pay | Admitting: General Surgery

## 2013-02-23 ENCOUNTER — Ambulatory Visit (INDEPENDENT_AMBULATORY_CARE_PROVIDER_SITE_OTHER): Payer: BC Managed Care – PPO | Admitting: General Surgery

## 2013-02-23 VITALS — BP 132/76 | HR 60 | Resp 14 | Ht 69.0 in | Wt 222.2 lb

## 2013-02-23 DIAGNOSIS — Z09 Encounter for follow-up examination after completed treatment for conditions other than malignant neoplasm: Secondary | ICD-10-CM

## 2013-02-23 NOTE — Patient Instructions (Signed)
Continued to drink 6-8 glasses of water a day and takes supplemental Metamucil Use wet wipes after having a bowel movement

## 2013-02-23 NOTE — Progress Notes (Signed)
Subjective:     Patient ID: Kristina Mclaughlin, female   DOB: [birth year], 62 y.o.   MRN: [id number]  HPI 62 year old Caucasian female comes in for followup after undergoing exam under anesthesia, open excision of left lateral internal-external hemorrhoid as well as open excision of right lateral internal-external hemorrhoid on January 24. I last saw her in the office on April 24. Since that time she states that she has been doing really well. She reports daily bowel movements. She is still taking Metamucil on a daily basis. She denies any bleeding. She states occasionally she will have some itching. . She denies any incontinence. T  PMHx, PSHx, SOCHx, FAMHx, ALL reviewed and unchanged  Review of Systems 8 point review of systems performed and negative except for history of present illness    Objective:   Physical Exam BP 132/76  Pulse 60  Resp 14  Ht 5\' 9"  (1.753 m)  Wt 222 lb 3.2 oz (100.789 kg)  BMI 32.8 kg/m2 Alert, nad Nonfocal she moves all extremities No rash, no jaundice, edema Rectal - Visual inspection reveals healed incisions both on the left and right side. There is a small amount of redundant nonthrombosed, noninflamed external hemorrhoidal tissue. There is a little bit of skin excoriation but nothing significant. Rectal tone is good. Anoscopy demonstrates well-healed incisions both the left and right lateral aspects of the anal canal. No significant hemorrhoidal burden    Assessment:     S/p EUA, left and right open int/ext hemorrhoidectomy     Plan:     Overall I am very pleased with how she is doing. I encouraged her to continue with the Metamucil and drinking plenty water forever.We discussed avoiding using toilet paper and using wet wipes instead. I also encouraged her to avoid using a washcloth in the shower. We also discussed using a mild soap such as Dove. F/u prn.   Mary Sella. Andrey Campanile, MD, FACS General, Bariatric, & Minimally Invasive Surgery [facility] Surgery,  [place]

## 2013-08-01 ENCOUNTER — Other Ambulatory Visit (HOSPITAL_COMMUNITY): Payer: Self-pay | Admitting: Nurse Practitioner

## 2013-08-01 DIAGNOSIS — Z1231 Encounter for screening mammogram for malignant neoplasm of breast: Secondary | ICD-10-CM

## 2013-08-02 ENCOUNTER — Ambulatory Visit (HOSPITAL_COMMUNITY): Payer: BC Managed Care – PPO

## 2013-08-02 ENCOUNTER — Encounter: Payer: Self-pay | Admitting: Family Medicine

## 2013-08-09 ENCOUNTER — Encounter: Payer: Self-pay | Admitting: Family Medicine

## 2014-03-02 ENCOUNTER — Encounter: Payer: Self-pay | Admitting: Family Medicine

## 2014-05-01 ENCOUNTER — Telehealth: Payer: Self-pay | Admitting: Nurse Practitioner

## 2014-05-01 NOTE — Telephone Encounter (Signed)
appt offered for in the am but patient unable to come at time offered

## 2014-05-22 ENCOUNTER — Ambulatory Visit (INDEPENDENT_AMBULATORY_CARE_PROVIDER_SITE_OTHER): Payer: BC Managed Care – PPO | Admitting: Family Medicine

## 2014-05-22 ENCOUNTER — Encounter (INDEPENDENT_AMBULATORY_CARE_PROVIDER_SITE_OTHER): Payer: Self-pay

## 2014-05-22 ENCOUNTER — Encounter: Payer: Self-pay | Admitting: Family Medicine

## 2014-05-22 VITALS — BP 124/72 | HR 87 | Temp 99.0°F | Ht 69.0 in | Wt 226.0 lb

## 2014-05-22 DIAGNOSIS — J029 Acute pharyngitis, unspecified: Secondary | ICD-10-CM

## 2014-05-22 DIAGNOSIS — R52 Pain, unspecified: Secondary | ICD-10-CM

## 2014-05-22 DIAGNOSIS — R509 Fever, unspecified: Secondary | ICD-10-CM

## 2014-05-22 DIAGNOSIS — J069 Acute upper respiratory infection, unspecified: Secondary | ICD-10-CM

## 2014-05-22 DIAGNOSIS — R05 Cough: Secondary | ICD-10-CM

## 2014-05-22 DIAGNOSIS — R059 Cough, unspecified: Secondary | ICD-10-CM

## 2014-05-22 LAB — POCT RAPID STREP A (OFFICE): Rapid Strep A Screen: NEGATIVE

## 2014-05-22 LAB — POCT INFLUENZA A/B
Influenza A, POC: NEGATIVE
Influenza B, POC: NEGATIVE

## 2014-05-22 MED ORDER — AZITHROMYCIN 250 MG PO TABS: ORAL_TABLET | ORAL | Status: DC

## 2014-05-22 NOTE — Progress Notes (Signed)
   Subjective:    Patient ID: Kristina Mclaughlin, female    DOB: 1950-12-20, 63 y.o.   MRN: [id number]  HPI This 63 y.o. female presents for evaluation of URI sx's for 2 days.   Review of Systems No chest pain, SOB, HA, dizziness, vision change, N/V, diarrhea, constipation, dysuria, urinary urgency or frequency, myalgias, arthralgias or rash.     Objective:   Physical Exam Vital signs noted  Well developed well nourished female.  HEENT - Head atraumatic Normocephalic                Eyes - PERRLA, Conjuctiva - clear Sclera- Clear EOMI                Ears - EAC's Wnl TM's Wnl Gross Hearing WNL                Throat - oropharanx wnl Respiratory - Lungs CTA bilateral Cardiac - RRR S1 and S2 without murmur GI - Abdomen soft Nontender and bowel sounds active x 4 Extremities - No edema. Neuro - Grossly intact.       Assessment & Plan:  Cough - Plan: POCT Influenza A/B, azithromycin (ZITHROMAX) 250 MG tablet  Other specified fever - Plan: POCT Influenza A/B, POCT rapid strep A, azithromycin (ZITHROMAX) 250 MG tablet  Sore throat - Plan: POCT rapid strep A, azithromycin (ZITHROMAX) 250 MG tablet  Body aches - Plan: POCT Influenza A/B, POCT rapid strep A, azithromycin (ZITHROMAX) 250 MG tablet  URI (upper respiratory infection)  Lysbeth Penner FNP

## 2014-05-23 ENCOUNTER — Telehealth: Payer: Self-pay | Admitting: Family Medicine

## 2014-05-23 NOTE — Telephone Encounter (Signed)
Please advise 

## 2014-05-24 NOTE — Telephone Encounter (Signed)
Tests were negative.  Antibiotic given. Had a low grade fever.  Drink lots of fluid, take meds, if viral will have to work its course.  Can return to work if not feverish and feels well enough.  Call our office or return if worse or no improvements.

## 2014-05-26 NOTE — Telephone Encounter (Signed)
Follow up if not better. 

## 2014-05-26 NOTE — Telephone Encounter (Signed)
On the mend now, does not need a work note

## 2014-08-07 ENCOUNTER — Ambulatory Visit (INDEPENDENT_AMBULATORY_CARE_PROVIDER_SITE_OTHER): Payer: BLUE CROSS/BLUE SHIELD

## 2014-08-07 ENCOUNTER — Encounter: Payer: Self-pay | Admitting: Family Medicine

## 2014-08-07 ENCOUNTER — Ambulatory Visit (INDEPENDENT_AMBULATORY_CARE_PROVIDER_SITE_OTHER): Payer: BLUE CROSS/BLUE SHIELD | Admitting: Family Medicine

## 2014-08-07 VITALS — Temp 98.6°F | Ht 69.0 in | Wt 228.8 lb

## 2014-08-07 DIAGNOSIS — R059 Cough, unspecified: Secondary | ICD-10-CM

## 2014-08-07 DIAGNOSIS — R05 Cough: Secondary | ICD-10-CM

## 2014-08-07 DIAGNOSIS — J206 Acute bronchitis due to rhinovirus: Secondary | ICD-10-CM

## 2014-08-07 MED ORDER — ALBUTEROL SULFATE HFA 108 (90 BASE) MCG/ACT IN AERS: 2.0000 | INHALATION_SPRAY | Freq: Four times a day (QID) | RESPIRATORY_TRACT | Status: DC | PRN

## 2014-08-07 MED ORDER — METHYLPREDNISOLONE (PAK) 4 MG PO TABS: ORAL_TABLET | ORAL | Status: DC

## 2014-08-07 MED ORDER — AMOXICILLIN 875 MG PO TABS: 875.0000 mg | ORAL_TABLET | Freq: Two times a day (BID) | ORAL | Status: DC

## 2014-08-07 MED ORDER — HYDROCODONE-HOMATROPINE 5-1.5 MG/5ML PO SYRP: 5.0000 mL | ORAL_SOLUTION | Freq: Three times a day (TID) | ORAL | Status: DC | PRN

## 2014-08-07 MED ORDER — METHYLPREDNISOLONE ACETATE 80 MG/ML IJ SUSP
80.0000 mg | Freq: Once | INTRAMUSCULAR | Status: AC
  Administered 2014-08-07: 80 mg via INTRAMUSCULAR

## 2014-08-07 MED ORDER — LEVALBUTEROL HCL 1.25 MG/3ML IN NEBU
1.2500 mg | INHALATION_SOLUTION | Freq: Once | RESPIRATORY_TRACT | Status: AC
  Administered 2014-08-07: 1.25 mg via RESPIRATORY_TRACT

## 2014-08-07 MED ORDER — LEVALBUTEROL HCL 1.25 MG/0.5ML IN NEBU
1.2500 mg | INHALATION_SOLUTION | Freq: Once | RESPIRATORY_TRACT | Status: DC
Start: 2014-08-07 — End: 2014-10-10

## 2014-08-07 NOTE — Progress Notes (Signed)
   Subjective:    Patient ID: Kristina Mclaughlin, female    DOB: [birth year], 64 y.o.   MRN: [id number]  HPI Patient is here with c/o persistent cough.  She has been sick for November.  She states she has difficulty sleeping at night because the cough is so bad.  Review of Systems  Constitutional: Negative for fever.  HENT: Negative for ear pain.   Eyes: Negative for discharge.  Respiratory: Negative for cough.   Cardiovascular: Negative for chest pain.  Gastrointestinal: Negative for abdominal distention.  Endocrine: Negative for polyuria.  Genitourinary: Negative for difficulty urinating.  Musculoskeletal: Negative for gait problem and neck pain.  Skin: Negative for color change and rash.  Neurological: Negative for speech difficulty and headaches.  Psychiatric/Behavioral: Negative for agitation.      Objective:    Temp(Src) 98.6 F (37 C) (Oral)  Ht 5\' 9"  (1.753 m)  Wt 228 lb 12.8 oz (103.783 kg)  BMI 33.77 kg/m2 Physical Exam  Constitutional: She is oriented to person, place, and time. She appears well-developed and well-nourished.  HENT:  Head: Normocephalic and atraumatic.  Mouth/Throat: Oropharynx is clear and moist.  Eyes: Pupils are equal, round, and reactive to light.  Neck: Normal range of motion. Neck supple.  Cardiovascular: Normal rate and regular rhythm.   No murmur heard. Pulmonary/Chest: Effort normal and breath sounds normal.  Abdominal: Soft. Bowel sounds are normal. There is no tenderness.  Neurological: She is alert and oriented to person, place, and time.  Skin: Skin is warm and dry.  Psychiatric: She has a normal mood and affect.          Assessment & Plan:     ICD-9-CM ICD-10-CM   1. Cough 786.2 R05 DG Chest 2 View     methylPREDNIsolone (MEDROL DOSPACK) 4 MG tablet     levalbuterol (XOPENEX) nebulizer solution 1.25 mg     methylPREDNISolone acetate (DEPO-MEDROL) injection 80 mg     amoxicillin (AMOXIL) 875 MG tablet     albuterol  (PROVENTIL HFA;VENTOLIN HFA) 108 (90 BASE) MCG/ACT inhaler     HYDROcodone-homatropine (HYCODAN) 5-1.5 MG/5ML syrup     DISCONTINUED: albuterol (PROVENTIL HFA;VENTOLIN HFA) 108 (90 BASE) MCG/ACT inhaler  2. Acute bronchitis due to Rhinovirus 466.0 J20.6 methylPREDNIsolone (MEDROL DOSPACK) 4 MG tablet   079.3  levalbuterol (XOPENEX) nebulizer solution 1.25 mg     methylPREDNISolone acetate (DEPO-MEDROL) injection 80 mg     amoxicillin (AMOXIL) 875 MG tablet     albuterol (PROVENTIL HFA;VENTOLIN HFA) 108 (90 BASE) MCG/ACT inhaler     HYDROcodone-homatropine (HYCODAN) 5-1.5 MG/5ML syrup     DISCONTINUED: albuterol (PROVENTIL HFA;VENTOLIN HFA) 108 (90 BASE) MCG/ACT inhaler   Push po fluids, rest, tylenol and motrin otc prn as directed for fever, arthralgias, and myalgias.  Follow up prn if sx's continue or persist.  No Follow-up on file.  Lysbeth Penner FNP

## 2014-08-07 NOTE — Addendum Note (Signed)
Addended by: Marin Olp on: 08/07/2014 03:29 PM   Modules accepted: Orders

## 2014-08-12 ENCOUNTER — Encounter: Payer: Self-pay | Admitting: Family Medicine

## 2014-08-30 ENCOUNTER — Ambulatory Visit (INDEPENDENT_AMBULATORY_CARE_PROVIDER_SITE_OTHER): Payer: BLUE CROSS/BLUE SHIELD | Admitting: Family Medicine

## 2014-08-30 ENCOUNTER — Encounter: Payer: Self-pay | Admitting: Family Medicine

## 2014-08-30 VITALS — BP 149/83 | HR 58 | Temp 97.3°F | Ht 69.0 in | Wt 227.2 lb

## 2014-08-30 DIAGNOSIS — R05 Cough: Secondary | ICD-10-CM

## 2014-08-30 DIAGNOSIS — S29011A Strain of muscle and tendon of front wall of thorax, initial encounter: Secondary | ICD-10-CM

## 2014-08-30 DIAGNOSIS — R059 Cough, unspecified: Secondary | ICD-10-CM

## 2014-08-30 DIAGNOSIS — J206 Acute bronchitis due to rhinovirus: Secondary | ICD-10-CM

## 2014-08-30 MED ORDER — AMOXICILLIN 875 MG PO TABS: 875.0000 mg | ORAL_TABLET | Freq: Two times a day (BID) | ORAL | Status: DC

## 2014-08-30 MED ORDER — HYDROCODONE-ACETAMINOPHEN 5-325 MG PO TABS: 1.0000 | ORAL_TABLET | Freq: Four times a day (QID) | ORAL | Status: DC | PRN

## 2014-08-30 MED ORDER — NAPROXEN 500 MG PO TABS: 500.0000 mg | ORAL_TABLET | Freq: Two times a day (BID) | ORAL | Status: DC

## 2014-08-30 NOTE — Progress Notes (Signed)
   Subjective:    Patient ID: Kristina Mclaughlin, female    DOB: 1951/06/02, 64 y.o.   MRN: [id number]  HPI Patient is here for c/o left intercostal and chest wall pain after coughing yesterday and pulling a muscle in her left posterior chest according to patient. She is having URI sx's again and is coughing up green again.  She was seen a few weeks ago for URI sx's.  Review of Systems  Constitutional: Negative for fever.  HENT: Negative for ear pain.   Eyes: Negative for discharge.  Respiratory: Negative for cough.   Cardiovascular: Negative for chest pain.  Gastrointestinal: Negative for abdominal distention.  Endocrine: Negative for polyuria.  Genitourinary: Negative for difficulty urinating.  Musculoskeletal: Negative for gait problem and neck pain.  Skin: Negative for color change and rash.  Neurological: Negative for speech difficulty and headaches.  Psychiatric/Behavioral: Negative for agitation.       Objective:    BP 149/83 mmHg  Pulse 58  Temp(Src) 97.3 F (36.3 C) (Oral)  Ht 5\' 9"  (1.753 m)  Wt 227 lb 3.2 oz (103.057 kg)  BMI 33.54 kg/m2 Physical Exam  Constitutional: She is oriented to person, place, and time. She appears well-developed and well-nourished.  HENT:  Head: Normocephalic and atraumatic.  Mouth/Throat: Oropharynx is clear and moist.  Eyes: Pupils are equal, round, and reactive to light.  Neck: Normal range of motion. Neck supple.  Cardiovascular: Normal rate and regular rhythm.   No murmur heard. Pulmonary/Chest: Effort normal and breath sounds normal.  Abdominal: Soft. Bowel sounds are normal. There is no tenderness.  Neurological: She is alert and oriented to person, place, and time.  Skin: Skin is warm and dry.  Psychiatric: She has a normal mood and affect.          Assessment & Plan:     ICD-9-CM ICD-10-CM   1. Chest wall muscle strain, initial encounter 848.8 S29.011A naproxen (NAPROSYN) 500 MG tablet     HYDROcodone-acetaminophen  (NORCO) 5-325 MG per tablet  2. Cough 786.2 R05 amoxicillin (AMOXIL) 875 MG tablet  3. Acute bronchitis due to Rhinovirus 466.0 J20.6 amoxicillin (AMOXIL) 875 MG tablet   079.3       Return if symptoms worsen or fail to improve.  Lysbeth Penner FNP

## 2014-09-23 ENCOUNTER — Other Ambulatory Visit: Payer: Self-pay | Admitting: Family Medicine

## 2014-09-28 ENCOUNTER — Ambulatory Visit (INDEPENDENT_AMBULATORY_CARE_PROVIDER_SITE_OTHER): Payer: BLUE CROSS/BLUE SHIELD | Admitting: Family Medicine

## 2014-09-28 ENCOUNTER — Encounter: Payer: Self-pay | Admitting: Family Medicine

## 2014-09-28 VITALS — BP 185/84 | HR 55 | Temp 97.9°F | Wt 237.0 lb

## 2014-09-28 DIAGNOSIS — R52 Pain, unspecified: Secondary | ICD-10-CM

## 2014-09-28 DIAGNOSIS — R05 Cough: Secondary | ICD-10-CM

## 2014-09-28 DIAGNOSIS — J40 Bronchitis, not specified as acute or chronic: Secondary | ICD-10-CM

## 2014-09-28 DIAGNOSIS — R509 Fever, unspecified: Secondary | ICD-10-CM

## 2014-09-28 DIAGNOSIS — J069 Acute upper respiratory infection, unspecified: Secondary | ICD-10-CM

## 2014-09-28 DIAGNOSIS — R059 Cough, unspecified: Secondary | ICD-10-CM

## 2014-09-28 DIAGNOSIS — J209 Acute bronchitis, unspecified: Secondary | ICD-10-CM

## 2014-09-28 LAB — POCT INFLUENZA A/B
Influenza A, POC: NEGATIVE
Influenza B, POC: NEGATIVE

## 2014-09-28 MED ORDER — AMOXICILLIN-POT CLAVULANATE 875-125 MG PO TABS
1.0000 | ORAL_TABLET | Freq: Two times a day (BID) | ORAL | Status: DC
Start: 2014-09-28 — End: 2014-10-10

## 2014-09-28 NOTE — Patient Instructions (Signed)
Please drink plenty of fluids Use nasal saline gel and mist----also keep the house as cool as possible and use a cool mist humidifier in your bedroom at nighttime Take Mucinex maximum strength, blue and white in color, 1 twice daily over the counter with a large glass of water Take antibiotic as directed with food and avoid caffeine

## 2014-09-28 NOTE — Progress Notes (Signed)
   Subjective:    Patient ID: Kristina Mclaughlin, female    DOB: 1950-09-18, 64 y.o.   MRN: [id number]  HPI  Patient is here today for cough, congestion, fever, chills, body aches and headache that started on Tuesday.  She states that her cough is productive, thick green. The patient has had a previous bout of this cough and congestion back in November and this one has been going on for about 1-1/2 weeks. She thinks she picked this up from people at work. She is coughing up green sputum and feels like she's been running a low-grade fever at times.         Review of Systems  Constitutional: Positive for fever and chills.  HENT: Positive for congestion and postnasal drip.   Respiratory: Positive for cough (prod, green).   Musculoskeletal: Positive for myalgias.       Objective:   Physical Exam  Constitutional: She is oriented to person, place, and time. She appears well-developed and well-nourished.  HENT:  Right Ear: External ear normal.  Left Ear: External ear normal.  Nose: Nose normal.  Mouth/Throat: Oropharynx is clear and moist.  Eyes: Conjunctivae and EOM are normal. Pupils are equal, round, and reactive to light. Right eye exhibits no discharge. Left eye exhibits no discharge. No scleral icterus.  Neck: Normal range of motion. Neck supple. No thyromegaly present.  Cardiovascular: Normal rate, regular rhythm and normal heart sounds.   No murmur heard. Pulmonary/Chest: Effort normal. She has wheezes. She has no rales.  Dry irritated cough  Abdominal: Bowel sounds are normal. She exhibits no mass.  Musculoskeletal: Normal range of motion.  Lymphadenopathy:    She has no cervical adenopathy.  Neurological: She is alert and oriented to person, place, and time.  Skin: Skin is warm and dry. No rash noted.  Psychiatric: She has a normal mood and affect. Her behavior is normal. Judgment and thought content normal.  Nursing note and vitals reviewed.   BP 185/84 mmHg  Pulse 55   Temp(Src) 97.9 F (36.6 C) (Oral)  Wt 237 lb (107.502 kg)  Results for orders placed or performed in visit on 09/28/14  POCT Influenza A/B  Result Value Ref Range   Influenza A, POC Negative    Influenza B, POC Negative           Assessment & Plan:  1. Cough -Use Mucinex as directed - POCT Influenza A/B  2. Other specified fever -Take Tylenol for aches pains and fever - POCT Influenza A/B  3. Body aches -Take Tylenol and drink plenty of fluids - POCT Influenza A/B  4. Bronchitis with bronchospasm -Take the antibiotic as directed and Mucinex for cough and congestion - amoxicillin-clavulanate (AUGMENTIN) 875-125 MG per tablet; Take 1 tablet by mouth 2 (two) times daily.  Dispense: 20 tablet; Refill: 0  5. URI, acute -Nasal saline and nasal gel - amoxicillin-clavulanate (AUGMENTIN) 875-125 MG per tablet; Take 1 tablet by mouth 2 (two) times daily.  Dispense: 20 tablet; Refill: 0  Patient Instructions  Please drink plenty of fluids Use nasal saline gel and mist----also keep the house as cool as possible and use a cool mist humidifier in your bedroom at nighttime Take Mucinex maximum strength, blue and white in color, 1 twice daily over the counter with a large glass of water Take antibiotic as directed with food and avoid caffeine    Arrie Senate MD

## 2014-09-29 ENCOUNTER — Telehealth: Payer: Self-pay | Admitting: Family Medicine

## 2014-09-29 ENCOUNTER — Other Ambulatory Visit: Payer: BLUE CROSS/BLUE SHIELD

## 2014-09-29 LAB — POCT CBC
Granulocyte percent: 69.6 %G (ref 37–80)
HCT, POC: 45.3 % (ref 37.7–47.9)
Hemoglobin: 14.1 g/dL (ref 12.2–16.2)
Lymph, poc: 1.8 (ref 0.6–3.4)
MCH, POC: 27.8 pg (ref 27–31.2)
MCHC: 31.2 g/dL — AB (ref 31.8–35.4)
MCV: 89.3 fL (ref 80–97)
MPV: 8.1 fL (ref 0–99.8)
POC Granulocyte: 4.5 (ref 2–6.9)
POC LYMPH PERCENT: 27.9 %L (ref 10–50)
Platelet Count, POC: 220 10*3/uL (ref 142–424)
RBC: 5.07 M/uL (ref 4.04–5.48)
RDW, POC: 13.2 %
WBC: 6.4 10*3/uL (ref 4.6–10.2)

## 2014-09-29 MED ORDER — HYDROCOD POLST-CHLORPHEN POLST 10-8 MG/5ML PO LQCR: 5.0000 mL | Freq: Every evening | ORAL | Status: DC | PRN

## 2014-09-29 NOTE — Progress Notes (Signed)
Lab draw for 09-28-14

## 2014-09-29 NOTE — Addendum Note (Signed)
Addended by: Zannie Cove on: 09/29/2014 05:22 PM   Modules accepted: Orders

## 2014-09-29 NOTE — Telephone Encounter (Signed)
Pt notified to take Mucinex Verbalizes understanding

## 2014-10-02 ENCOUNTER — Encounter: Payer: Self-pay | Admitting: *Deleted

## 2014-10-02 ENCOUNTER — Other Ambulatory Visit: Payer: Self-pay | Admitting: *Deleted

## 2014-10-02 MED ORDER — PREDNISONE 10 MG PO TABS: ORAL_TABLET | ORAL | Status: DC

## 2014-10-05 ENCOUNTER — Telehealth: Payer: Self-pay | Admitting: Family Medicine

## 2014-10-05 DIAGNOSIS — R059 Cough, unspecified: Secondary | ICD-10-CM

## 2014-10-05 DIAGNOSIS — R05 Cough: Secondary | ICD-10-CM

## 2014-10-05 NOTE — Telephone Encounter (Signed)
Referral placed.

## 2014-10-05 NOTE — Telephone Encounter (Signed)
Please have patient see Dr. Luan Pulling today or tomorrow about this persistent problem that we have not been able to help

## 2014-10-05 NOTE — Telephone Encounter (Signed)
Patient continues to have productive cough and nasal congestion. Last seen 1 week ago.  She is taking Augmentin, Prednisone, Mucinex, Tussionex, and albuterol inhaler.   She is coughing to the point of gagging and it is affecting her sleep. Do you have any recommendations?

## 2014-10-06 ENCOUNTER — Emergency Department (HOSPITAL_BASED_OUTPATIENT_CLINIC_OR_DEPARTMENT_OTHER)
Admission: EM | Admit: 2014-10-06 | Discharge: 2014-10-06 | Disposition: A | Discharge status: Home / Self Care | Payer: BLUE CROSS/BLUE SHIELD | Attending: Emergency Medicine | Admitting: Emergency Medicine

## 2014-10-06 ENCOUNTER — Encounter (HOSPITAL_BASED_OUTPATIENT_CLINIC_OR_DEPARTMENT_OTHER): Payer: Self-pay

## 2014-10-06 ENCOUNTER — Emergency Department (HOSPITAL_BASED_OUTPATIENT_CLINIC_OR_DEPARTMENT_OTHER): Payer: BLUE CROSS/BLUE SHIELD

## 2014-10-06 DIAGNOSIS — J3489 Other specified disorders of nose and nasal sinuses: Secondary | ICD-10-CM | POA: Insufficient documentation

## 2014-10-06 DIAGNOSIS — I10 Essential (primary) hypertension: Secondary | ICD-10-CM | POA: Diagnosis not present

## 2014-10-06 DIAGNOSIS — S299XXA Unspecified injury of thorax, initial encounter: Secondary | ICD-10-CM | POA: Diagnosis present

## 2014-10-06 DIAGNOSIS — Z79899 Other long term (current) drug therapy: Secondary | ICD-10-CM | POA: Diagnosis not present

## 2014-10-06 DIAGNOSIS — Y9289 Other specified places as the place of occurrence of the external cause: Secondary | ICD-10-CM | POA: Insufficient documentation

## 2014-10-06 DIAGNOSIS — R5383 Other fatigue: Secondary | ICD-10-CM | POA: Diagnosis not present

## 2014-10-06 DIAGNOSIS — Y9389 Activity, other specified: Secondary | ICD-10-CM | POA: Diagnosis not present

## 2014-10-06 DIAGNOSIS — Z791 Long term (current) use of non-steroidal anti-inflammatories (NSAID): Secondary | ICD-10-CM | POA: Diagnosis not present

## 2014-10-06 DIAGNOSIS — Y998 Other external cause status: Secondary | ICD-10-CM | POA: Insufficient documentation

## 2014-10-06 DIAGNOSIS — X58XXXA Exposure to other specified factors, initial encounter: Secondary | ICD-10-CM | POA: Diagnosis not present

## 2014-10-06 DIAGNOSIS — R05 Cough: Secondary | ICD-10-CM | POA: Insufficient documentation

## 2014-10-06 DIAGNOSIS — S2232XA Fracture of one rib, left side, initial encounter for closed fracture: Secondary | ICD-10-CM | POA: Insufficient documentation

## 2014-10-06 DIAGNOSIS — Z7952 Long term (current) use of systemic steroids: Secondary | ICD-10-CM | POA: Insufficient documentation

## 2014-10-06 MED ORDER — OXYCODONE-ACETAMINOPHEN 5-325 MG PO TABS
1.0000 | ORAL_TABLET | ORAL | Status: DC | PRN
Start: 2014-10-06 — End: 2014-10-10

## 2014-10-06 NOTE — Discharge Instructions (Signed)

## 2014-10-06 NOTE — ED Notes (Signed)
Patient transported to X-ray via wheelchair per tech.

## 2014-10-06 NOTE — ED Provider Notes (Signed)
CSN: [id number]     Arrival date & time 10/06/14  1255 History   First MD Initiated Contact with Patient 10/06/14 1503     Chief Complaint  Patient presents with  . Back Pain  . Cough     (Consider location/radiation/quality/duration/timing/severity/associated sxs/prior Treatment) HPI Comments: Patient complains of rib pain. She's had intermittent cough and cold symptoms since November 2015. She states her most recent cough is been going on for the last 2-3 weeks. She's currently on Augmentin as well as a prednisone taper and Tussionex cough syrup. She states she felt like her cough is improving but this morning had a coughing episode and felt a pop in her left upper back. This is the same area where she fractured her rib in January during a coughing spell. She feels the same type of pain that she had then. The pain is sharp and worse with movement or deep breathing. She denies any shortness of breath. Her cough is productive of yellow sputum. She denies any fevers. She denies any leg pain or swelling.  Patient is a 64 y.o. female presenting with back pain and cough.  Back Pain Associated symptoms: no abdominal pain, no chest pain, no fever, no headaches, no numbness and no weakness   Cough Associated symptoms: rhinorrhea   Associated symptoms: no chest pain, no chills, no diaphoresis, no fever, no headaches, no rash and no shortness of breath     Past Medical History  Diagnosis Date  . Hypertension    Past Surgical History  Procedure Laterality Date  . Breast surgery      biopsy x2; reduction  . Abdominal hysterectomy    . Back surgery    . Examination under anesthesia  08/27/2012   Family History  Problem Relation Age of Onset  . Bladder Cancer Mother    History  Substance Use Topics  . Smoking status: Former Research scientist (life sciences)  . Smokeless tobacco: Not on file  . Alcohol Use: No   OB History    No data available     Review of Systems  Constitutional: Positive for fatigue.  Negative for fever, chills and diaphoresis.  HENT: Positive for congestion and rhinorrhea. Negative for sneezing.   Eyes: Negative.   Respiratory: Positive for cough. Negative for chest tightness and shortness of breath.   Cardiovascular: Negative for chest pain and leg swelling.  Gastrointestinal: Negative for nausea, vomiting, abdominal pain, diarrhea and blood in stool.  Genitourinary: Negative for frequency, hematuria, flank pain and difficulty urinating.  Musculoskeletal: Positive for back pain. Negative for arthralgias.  Skin: Negative for rash.  Neurological: Negative for dizziness, speech difficulty, weakness, numbness and headaches.      Allergies  Quinolones  Home Medications   Prior to Admission medications   Medication Sig Start Date End Date Taking? Authorizing Provider  amoxicillin-clavulanate (AUGMENTIN) 875-125 MG per tablet Take 1 tablet by mouth 2 (two) times daily. 09/28/14   Chipper Herb, MD  Calcium Carbonate-Vitamin D (CALCIUM + D PO) Take by mouth.    Historical Provider, MD  fish oil-omega-3 fatty acids 1000 MG capsule Take 2 g by mouth daily.    Historical Provider, MD  Multiple Vitamin (MULTI-VITAMIN PO) Take by mouth.    Historical Provider, MD  naproxen (NAPROSYN) 500 MG tablet Take 1 tablet (500 mg total) by mouth 2 (two) times daily with a meal. 08/30/14   Lysbeth Penner, FNP  omeprazole (PRILOSEC) 40 MG capsule Take 40 mg by mouth daily.    Historical  Provider, MD  oxyCODONE-acetaminophen (PERCOCET) 5-325 MG per tablet Take 1-2 tablets by mouth every 4 (four) hours as needed. 10/06/14   Malvin Johns, MD  predniSONE (DELTASONE) 10 MG tablet TAPER- 1 tab by mouth four times daily for 2 days, 1 tab by mouth three times daily for 2 days, 1 tab by mouth twice a day for 2 days, then 1 tab by mouth daily for 2 days, then stop. 10/02/14   Chipper Herb, MD  PROAIR HFA 108 (90 BASE) MCG/ACT inhaler INHALE 2 PUFFS INTO THE LUNGS EVERY 6 (SIX) HOURS AS NEEDED FOR  WHEEZING OR SHORTNESS OF BREATH. 09/25/14   Chipper Herb, MD  Probiotic Product (PROBIOTIC DAILY PO) Take by mouth.    Historical Provider, MD  temazepam (RESTORIL) 15 MG capsule  06/09/12   Historical Provider, MD  valsartan-hydrochlorothiazide (DIOVAN-HCT) 320-25 MG per tablet  07/14/12   Historical Provider, MD   BP 161/73 mmHg  Pulse 56  Temp(Src) 98.2 F (36.8 C) (Oral)  Resp 16  Ht 5\' 9"  (1.753 m)  Wt 220 lb (99.791 kg)  BMI 32.47 kg/m2  SpO2 96% Physical Exam  Constitutional: She is oriented to person, place, and time. She appears well-developed and well-nourished.  HENT:  Head: Normocephalic and atraumatic.  Eyes: Pupils are equal, round, and reactive to light.  Neck: Normal range of motion. Neck supple.  Cardiovascular: Normal rate, regular rhythm and normal heart sounds.   Pulmonary/Chest: Effort normal and breath sounds normal. No respiratory distress. She has no wheezes. She has no rales. She exhibits tenderness (positive tenderness to the left  upper back along the posterior ribs just inferior to the scapula. no crepitus or deformity is noted).  Abdominal: Soft. Bowel sounds are normal. There is no tenderness. There is no rebound and no guarding.  Musculoskeletal: Normal range of motion. She exhibits no edema.  No calf tenderness  Lymphadenopathy:    She has no cervical adenopathy.  Neurological: She is alert and oriented to person, place, and time.  Skin: Skin is warm and dry. No rash noted.  Psychiatric: She has a normal mood and affect.    ED Course  Procedures (including critical care time) Labs Review Labs Reviewed - No data to display  Imaging Review Dg Chest 2 View  10/06/2014   CLINICAL DATA:  Cough congestion for a week.  Left rib pain.  EXAM: CHEST  2 VIEW  COMPARISON:  August 07, 2014.  FINDINGS: The heart size and mediastinal contours are within normal limits. Both lungs are clear. Small hiatal hernia is noted. No pneumothorax or pleural effusion is  noted. 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  IMPRESSION: Small hiatal hernia.  No acute cardiopulmonary abnormality seen.   Electronically Signed   By: Marijo Conception, M.D.   On: 10/06/2014 13:50     EKG Interpretation None      MDM   Final diagnoses:  Closed rib fracture, left, initial encounter    Patient has no evidence of pneumonia. There is no evidence of pneumothorax. Her symptoms are consistent with a rib fracture. She was given prescription for Percocet for pain management. She also has Naprosyn to use at home. I advised her not to use the Tussionex while she is using the Percocet for pain. She will make a follow-up appointment with her primary care physician. I advised her return here for symptoms worsen.    Malvin Johns, MD 10/06/14 [phone]

## 2014-10-06 NOTE — ED Notes (Signed)
Pt reports recently has had cough/congestion, bronchitis.  States today was coughing and felt something "pop", states hx of broken rib in January when sick with same.  Same area which is hurting now.  Denies sob but reports pain with deep inspiration.

## 2014-10-06 NOTE — ED Notes (Signed)
Pt c/o cough and "asthmatic bronchitis" diagnosis Nov/Dec 2015.  Pt has been on several rounds of antibiotics and prednisone and sent home with inhaler after initial diagnosis.  Pt states cough is not worsening, but not improving either.  Pt states she coughed so hard this morning, she "felt a pop" in her left lower side/ribs area.  Pt states extreme pain in left lower ribs since cough.

## 2014-10-09 ENCOUNTER — Telehealth: Payer: Self-pay | Admitting: Family Medicine

## 2014-10-09 NOTE — Telephone Encounter (Signed)
Triage can you work out something , it does not have to be with moore, - we did a acute visit and she was to f/u with pulmonary.

## 2014-10-09 NOTE — Telephone Encounter (Signed)
Appointment given for 3/8 with Sabra Heck.

## 2014-10-10 ENCOUNTER — Telehealth: Payer: Self-pay | Admitting: Family Medicine

## 2014-10-10 ENCOUNTER — Encounter: Payer: Self-pay | Admitting: Family Medicine

## 2014-10-10 ENCOUNTER — Ambulatory Visit (INDEPENDENT_AMBULATORY_CARE_PROVIDER_SITE_OTHER): Payer: BLUE CROSS/BLUE SHIELD

## 2014-10-10 ENCOUNTER — Ambulatory Visit (INDEPENDENT_AMBULATORY_CARE_PROVIDER_SITE_OTHER): Payer: BLUE CROSS/BLUE SHIELD | Admitting: Family Medicine

## 2014-10-10 VITALS — BP 143/86 | HR 69 | Temp 97.9°F | Ht 69.0 in | Wt 225.0 lb

## 2014-10-10 DIAGNOSIS — R0781 Pleurodynia: Secondary | ICD-10-CM | POA: Diagnosis not present

## 2014-10-10 MED ORDER — OXYCODONE-ACETAMINOPHEN 5-325 MG PO TABS: 1.0000 | ORAL_TABLET | Freq: Three times a day (TID) | ORAL | Status: DC | PRN

## 2014-10-10 NOTE — Progress Notes (Signed)
   Subjective:    Patient ID: Kristina Mclaughlin, female    DOB: Oct 02, 1950, 64 y.o.   MRN: [id number]  HPI 64 year old female here to follow-up emergency room visit for possible rib fracture. She was actually told by the physician emergency room that she had a rib fracture but x-ray report generated that day does not comment on any rib fracture. She was given oxycodone for cough and pain.  There are no active problems to display for this patient.  Outpatient Encounter Prescriptions as of 10/10/2014  Medication Sig  . Calcium Carbonate-Vitamin D (CALCIUM + D PO) Take by mouth.  . fish oil-omega-3 fatty acids 1000 MG capsule Take 2 g by mouth daily.  Marland Kitchen guaiFENesin (MUCINEX) 600 MG 12 hr tablet Take 600 mg by mouth 2 (two) times daily.  . Multiple Vitamin (MULTI-VITAMIN PO) Take by mouth.  Marland Kitchen omeprazole (PRILOSEC) 40 MG capsule Take 40 mg by mouth daily.  Marland Kitchen oxyCODONE-acetaminophen (PERCOCET) 5-325 MG per tablet Take 1-2 tablets by mouth every 4 (four) hours as needed.  Marland Kitchen PROAIR HFA 108 (90 BASE) MCG/ACT inhaler INHALE 2 PUFFS INTO THE LUNGS EVERY 6 (SIX) HOURS AS NEEDED FOR WHEEZING OR SHORTNESS OF BREATH.  . Probiotic Product (PROBIOTIC DAILY PO) Take by mouth.  . temazepam (RESTORIL) 15 MG capsule   . valsartan-hydrochlorothiazide (DIOVAN-HCT) 320-25 MG per tablet   . [DISCONTINUED] amoxicillin-clavulanate (AUGMENTIN) 875-125 MG per tablet Take 1 tablet by mouth 2 (two) times daily.  . [DISCONTINUED] naproxen (NAPROSYN) 500 MG tablet Take 1 tablet (500 mg total) by mouth 2 (two) times daily with a meal.  . [DISCONTINUED] predniSONE (DELTASONE) 10 MG tablet TAPER- 1 tab by mouth four times daily for 2 days, 1 tab by mouth three times daily for 2 days, 1 tab by mouth twice a day for 2 days, then 1 tab by mouth daily for 2 days, then stop.  . [DISCONTINUED] levalbuterol (XOPENEX) nebulizer solution 1.25 mg       Review of Systems  Respiratory: Positive for cough.   Cardiovascular: Positive for  chest pain.       Pain is left posterior       Objective:   Physical Exam  Constitutional: She appears well-developed.  Pulmonary/Chest: Effort normal and breath sounds normal. She exhibits tenderness.    BP 143/86 mmHg  Pulse 69  Temp(Src) 97.9 F (36.6 C) (Oral)  Ht 5\' 9"  (1.753 m)  Wt 225 lb (102.059 kg)  BMI 33.21 kg/m2      Assessment & Plan:  1. Rib pain on left side Clinically, patient would seem to have a rib fracture but this cannot be confirmed by x-ray. There was no trauma on the cough. I believe this is should probably be diagnosis chest wall pain related to excessive coughing  Wardell Honour MD - DG Ribs Unilateral W/Chest Left; Future

## 2014-10-10 NOTE — Addendum Note (Signed)
Addended by: Ilean China on: 10/10/2014 11:22 AM   Modules accepted: Orders

## 2014-10-10 NOTE — Telephone Encounter (Signed)
Pt given appt with Dr.Moore 3/15 at 11:45, pt had originally been seen by Dr.Moore.

## 2014-10-13 ENCOUNTER — Other Ambulatory Visit: Payer: Self-pay | Admitting: Family Medicine

## 2014-10-13 DIAGNOSIS — Z0289 Encounter for other administrative examinations: Secondary | ICD-10-CM

## 2014-10-13 NOTE — Telephone Encounter (Signed)
Shouldn't need more antibiotics- cough may last several weeks- just treat cough with OTC cough meds and cough drops

## 2014-10-16 ENCOUNTER — Telehealth: Payer: Self-pay | Admitting: *Deleted

## 2014-10-16 NOTE — Telephone Encounter (Signed)
Woke up this morning with facial swelling and states that her face feels weird.  Denies throat or tongue swelling. She isn't having any more difficulty breathing than normal. She did take a new type of Mucinex before bed last night. No new foods.  She is at home by herself and hasn't taken anything for the swelling. Suggested Benadryl 50mg . Advised that this will make her drowsy. There are no available appointments today. Advised to seek care at Urgent Care or to call 911 if she develops any difficulty breathing or tongue/throat swelling.  Patient stated understanding and agreement to plan.

## 2014-10-16 NOTE — Telephone Encounter (Signed)
Detailed message left for patient.

## 2014-10-17 ENCOUNTER — Ambulatory Visit (INDEPENDENT_AMBULATORY_CARE_PROVIDER_SITE_OTHER): Payer: BLUE CROSS/BLUE SHIELD | Admitting: Family Medicine

## 2014-10-17 ENCOUNTER — Telehealth: Payer: Self-pay | Admitting: *Deleted

## 2014-10-17 ENCOUNTER — Encounter: Payer: Self-pay | Admitting: Family Medicine

## 2014-10-17 VITALS — BP 137/85 | HR 63 | Temp 98.4°F | Ht 69.0 in | Wt 227.0 lb

## 2014-10-17 DIAGNOSIS — R05 Cough: Secondary | ICD-10-CM | POA: Diagnosis not present

## 2014-10-17 DIAGNOSIS — J40 Bronchitis, not specified as acute or chronic: Secondary | ICD-10-CM

## 2014-10-17 DIAGNOSIS — R059 Cough, unspecified: Secondary | ICD-10-CM

## 2014-10-17 DIAGNOSIS — J209 Acute bronchitis, unspecified: Secondary | ICD-10-CM

## 2014-10-17 NOTE — Patient Instructions (Signed)
The patient should continue with drinking lots of fluids and keeping the house as cool as possible She should use a cool mist humidifier regularly She should remain out of work until she sees the pulmonologist which we hope we can get an earlier appointment this week She can try plain Mucinex maximum strength, blue and white in color one twice daily for cough and congestion with a large glass of water She is currently on prednisone for an allergic reaction and she should continue and finish this.

## 2014-10-17 NOTE — Progress Notes (Signed)
Subjective:    Patient ID: Kristina Mclaughlin, female    DOB: Jun 07, 1951, 64 y.o.   MRN: [id number]  HPI Patient here today for follow up from cough and congestion. She also was seen yesterday at [facility] urgent for allergic reaction to possibly [employer] Mucinex. There she was given prednisone. Patient has been to the office on several occasions and continues to have cough and congestion and side pain. She subsequently went to the emergency room and it was thought she had some fractured ribs. This was not observed on the chest x-ray we did. She has a future appointment with a pulmonologist. But continues to have problems with cough and congestion. She doesn't have an appointment with the pulmonary specialist until April 6.        There are no active problems to display for this patient.  Outpatient Encounter Prescriptions as of 10/17/2014  Medication Sig  . Calcium Carbonate-Vitamin D (CALCIUM + D PO) Take by mouth.  . fish oil-omega-3 fatty acids 1000 MG capsule Take 2 g by mouth daily.  Marland Kitchen guaiFENesin (MUCINEX) 600 MG 12 hr tablet Take 600 mg by mouth 2 (two) times daily.  . Multiple Vitamin (MULTI-VITAMIN PO) Take by mouth.  Marland Kitchen omeprazole (PRILOSEC) 40 MG capsule Take 40 mg by mouth daily.  Marland Kitchen oxyCODONE-acetaminophen (PERCOCET) 5-325 MG per tablet Take 1 tablet by mouth 3 (three) times daily as needed.  . predniSONE (DELTASONE) 10 MG tablet   . PROAIR HFA 108 (90 BASE) MCG/ACT inhaler INHALE 2 PUFFS INTO THE LUNGS EVERY 6 (SIX) HOURS AS NEEDED FOR WHEEZING OR SHORTNESS OF BREATH.  . Probiotic Product (PROBIOTIC DAILY PO) Take by mouth.  . temazepam (RESTORIL) 15 MG capsule   . valsartan-hydrochlorothiazide (DIOVAN-HCT) 320-25 MG per tablet     Review of Systems  Constitutional: Negative.   HENT: Positive for congestion (yellow-tint).   Eyes: Negative.   Respiratory: Positive for cough.   Cardiovascular: Negative.   Gastrointestinal: Negative.   Endocrine: Negative.   Genitourinary:  Negative.   Musculoskeletal: Negative.   Skin: Negative.   Allergic/Immunologic: Negative.   Neurological: Negative.   Hematological: Negative.   Psychiatric/Behavioral: Negative.        Objective:   Physical Exam  Constitutional: She is oriented to person, place, and time. She appears well-developed and well-nourished. She appears distressed.  The patient is distressed because of the persistent irritative cough that is minimally productive of slightly yellow sputum.  HENT:  Head: Normocephalic and atraumatic.  Right Ear: External ear normal.  Left Ear: External ear normal.  Mouth/Throat: Oropharynx is clear and moist.  There is nasal congestion bilaterally  Eyes: Conjunctivae and EOM are normal. Pupils are equal, round, and reactive to light. Right eye exhibits no discharge. Left eye exhibits no discharge. No scleral icterus.  Neck: Normal range of motion. Neck supple. No thyromegaly present.  Cardiovascular: Normal rate, regular rhythm and normal heart sounds.   No murmur heard. Pulmonary/Chest: Effort normal and breath sounds normal. She has no wheezes. She has no rales.  The patient has a persistent dry cough that is aggravated by inhalation with talking or taking deep breaths, there are no rales.  Musculoskeletal: Normal range of motion. She exhibits no edema.  Lymphadenopathy:    She has no cervical adenopathy.  Neurological: She is alert and oriented to person, place, and time.  Skin: Skin is warm and dry. No rash noted.  Psychiatric: She has a normal mood and affect. Her behavior is normal. Thought content  normal.  Nursing note and vitals reviewed.  BP 137/85 mmHg  Pulse 63  Temp(Src) 98.4 F (36.9 C) (Oral)  Ht 5\' 9"  (1.753 m)  Wt 227 lb (102.967 kg)  BMI 33.51 kg/m2        Assessment & Plan:  1. Bronchitis with bronchospasm -Referral to pulmonologist as soon as possible for further evaluation to expedite the patient's recovery from this severe bronchitis and  bronchospasm that she has had  2. Cough -Continue drinking plenty of fluids and using plain Mucinex with a large glass of water -Continue Coumadin as humidification  Patient Instructions  The patient should continue with drinking lots of fluids and keeping the house as cool as possible She should use a cool mist humidifier regularly She should remain out of work until she sees the pulmonologist which we hope we can get an earlier appointment this week She can try plain Mucinex maximum strength, blue and white in color one twice daily for cough and congestion with a large glass of water She is currently on prednisone for an allergic reaction and she should continue and finish this.   Arrie Senate MD

## 2014-10-17 NOTE — Telephone Encounter (Signed)
-----   Message from Tanda Rockers, MD sent at 10/17/2014  1:22 PM EDT ----- Add her on by weekend with all meds in hand

## 2014-10-19 NOTE — Telephone Encounter (Signed)
Pt is calling back states they were told they have an appt on Friday at 12:30  [phone]

## 2014-10-19 NOTE — Telephone Encounter (Signed)
Spoke with pt and advised that Dr Melvyn Novas can see her tomorrow at 1:30.  Pt verbalized understanding and will bring all meds in hand.

## 2014-10-20 ENCOUNTER — Encounter: Payer: Self-pay | Admitting: Internal Medicine

## 2014-10-20 ENCOUNTER — Ambulatory Visit (INDEPENDENT_AMBULATORY_CARE_PROVIDER_SITE_OTHER): Payer: BLUE CROSS/BLUE SHIELD | Admitting: Internal Medicine

## 2014-10-20 VITALS — BP 156/90 | HR 69 | Ht 69.0 in | Wt 226.0 lb

## 2014-10-20 DIAGNOSIS — R058 Other specified cough: Secondary | ICD-10-CM | POA: Insufficient documentation

## 2014-10-20 DIAGNOSIS — R05 Cough: Secondary | ICD-10-CM

## 2014-10-20 MED ORDER — PANTOPRAZOLE SODIUM 40 MG PO TBEC: 40.0000 mg | DELAYED_RELEASE_TABLET | Freq: Every day | ORAL | Status: DC

## 2014-10-20 MED ORDER — OXYCODONE-ACETAMINOPHEN 5-325 MG PO TABS: 1.0000 | ORAL_TABLET | ORAL | Status: DC | PRN

## 2014-10-20 NOTE — Patient Instructions (Addendum)
Please see patient coordinator before you leave today  to schedule sinus CT and we will call you with results   The key to effective treatment for your cough is eliminating the non-stop cycle of cough you're stuck in long enough to let your airway heal completely and then see if there is anything still making you cough once you stop the cough suppression, but this should take no more than 5 days to figure out  First take delsym two tsp every 12 hours and supplement if needed with  Percocet  up to 2 every 4 hours to suppress the urge to cough at all or even clear your throat. Swallowing water or using ice chips/non mint and menthol containing candies (such as lifesavers or sugarless jolly ranchers) are also effective.  You should rest your voice and avoid activities that you know make you cough.  Once you have eliminated the cough for 3 straight days try reducing the percocet   then the delsym as tolerated.    Prednisone  Taper off as you plan  Protonix (pantoprazole) Take 30-60 min before first meal of the day and take prilosec 30 min before supper and zantac 150 one bedtime plus chlorpheniramine 4 mg x 2 at bedtime (both available over the counter)  until cough is completely gone for at least a week without the need for cough suppression  GERD (REFLUX)  is an extremely common cause of respiratory symptoms, many times with no significant heartburn at all.    It can be treated with medication, but also with lifestyle changes including avoidance of late meals, excessive alcohol, smoking cessation, and avoid fatty foods, chocolate, peppermint, colas, red wine, and acidic juices such as orange juice.  NO MINT OR MENTHOL PRODUCTS SO NO COUGH DROPS  USE HARD CANDY INSTEAD (jolley ranchers or Stover's or Lifesavers (all available in sugarless versions) NO OIL BASED VITAMINS - use powdered substitutes.  If not better return after the Easter holidays with all meds in hand to regroup

## 2014-10-20 NOTE — Progress Notes (Signed)
Subjective:    Patient ID: Kristina Mclaughlin, female    DOB: [birth year],    MRN: [id number]  HPI  31 yowf light smoker quit early 20's with onset also about the same time  of rhinitis sping > fall rhinitis freq ov's never prednisone / mostly antihistamines some better over the years but tendency to bad colds not related to seasons rx with abx / cough med then better within a couple of weeks to a month before better for a year but starting Nov 2015 onset of head cold while on vacation in [place]  And coughing ever since so referred by Dr Morrie Sheldon to pulmonary office 10/20/14    . 10/20/2014 1st [facility] office visit/ Kristina Mclaughlin   Chief Complaint  Patient presents with  . Pulmonary Consult    Referred by Dr. Laurance Flatten. Pt c/o cough on and off since Nov 2015. She states cough is worse at night and is occ prod with minimal clear sputum. Sometimes feels as if she can not take in a good, deep breath.   cough worse p supper also can't lie down due to cough and has lots of am drainage but nothing purulent Has had reflux for years maintained on ppi    No obvious other patterns in day to day or daytime variabilty or assoc sob unless coughing  or cp or chest tightness, subjective wheeze overt sinus or hb symptoms. No unusual exp hx or h/o childhood pna/ asthma or knowledge of premature birth.  Sleeping ok without nocturnal  or early am exacerbation  of respiratory  c/o's or need for noct saba. Also denies any obvious fluctuation of symptoms with weather or environmental changes or other aggravating or alleviating factors except as outlined above   Current Medications, Allergies, Complete Past Medical History, Past Surgical History, Family History, and Social History were reviewed in [employer] record.                 Review of Systems  Constitutional: Negative for fever, chills and unexpected weight change.  HENT: Positive for congestion and sneezing. Negative for  dental problem, ear pain, nosebleeds, postnasal drip, rhinorrhea, sinus pressure, sore throat, trouble swallowing and voice change.   Eyes: Negative for visual disturbance.  Respiratory: Positive for cough and shortness of breath. Negative for choking.   Cardiovascular: Negative for chest pain and leg swelling.  Gastrointestinal: Negative for vomiting, abdominal pain and diarrhea.  Genitourinary: Negative for difficulty urinating.  Musculoskeletal: Negative for arthralgias.  Skin: Negative for rash.  Neurological: Positive for headaches. Negative for tremors and syncope.  Hematological: Does not bruise/bleed easily.       Objective:   Physical Exam  Wt Readings from Last 3 Encounters:  10/20/14 226 lb (102.513 kg)  10/17/14 227 lb (102.967 kg)  10/10/14 225 lb (102.059 kg)    Vital signs reviewed   HEENT: nl dentition, turbinates, and orophanx. Nl external ear canals without cough reflex   NECK :  without JVD/Nodes/TM/ nl carotid upstrokes bilaterally   LUNGS: no acc muscle use, clear to A and P bilaterally without cough on insp or exp maneuvers   CV:  RRR  no s3 or murmur or increase in P2, no edema   ABD:  soft and nontender with nl excursion in the supine position. No bruits or organomegaly, bowel sounds nl  MS:  warm without deformities, calf tenderness, cyanosis or clubbing  SKIN: warm and dry without lesions    NEURO:  alert,  approp, no deficits      I personally reviewed images and agree with radiology impression as follows:  CXR:  10/06/14 Small hiatal hernia. No acute cardiopulmonary abnormality seen.     Assessment & Plan:

## 2014-10-21 ENCOUNTER — Encounter: Payer: Self-pay | Admitting: Internal Medicine

## 2014-10-21 NOTE — Assessment & Plan Note (Signed)
The most common causes of chronic cough in immunocompetent adults include the following: upper airway cough syndrome (UACS), previously referred to as postnasal drip syndrome (PNDS), which is caused by variety of rhinosinus conditions; (2) asthma; (3) GERD; (4) chronic bronchitis from cigarette smoking or other inhaled environmental irritants; (5) nonasthmatic eosinophilic bronchitis; and (6) bronchiectasis.   These conditions, singly or in combination, have accounted for up to 94% of the causes of chronic cough in prospective studies.   Other conditions have constituted no >6% of the causes in prospective studies These have included bronchogenic carcinoma, chronic interstitial pneumonia, sarcoidosis, left ventricular failure, ACEI-induced cough, and aspiration from a condition associated with pharyngeal dysfunction.    Chronic cough is often simultaneously caused by more than one condition. A single cause has been found from 38 to 82% of the time, multiple causes from 18 to 62%. Multiply caused cough has been the result of three diseases up to 42% of the time.       Based on hx and exam, this is most likely:  Classic Upper airway cough syndrome, so named because it's frequently impossible to sort out how much is  CR/sinusitis with freq throat clearing (which can be related to primary GERD)   vs  causing  secondary (" extra esophageal")  GERD from wide swings in gastric pressure that occur with throat clearing, often  promoting self use of mint and menthol lozenges that reduce the lower esophageal sphincter tone and exacerbate the problem further in a cyclical fashion.   These are the same pts (now being labeled as having "irritable larynx syndrome" by some cough centers) who not infrequently have a history of having failed to tolerate ace inhibitors,  dry powder inhalers or biphosphonates or report having atypical reflux symptoms that don't respond to standard doses of PPI , and are easily confused as  having aecopd or asthma flares by even experienced allergists/ pulmonologists.   The first step is to maximize acid suppression and eliminate cyclical coughing and eliminated chronic /acute sinus dz from ddx with sinus ct  Have also not excluded cough variant asthma and may needs methacholine challenge test for this purpose   Discussed with pt:  The standardized cough guidelines published in Chest by Lissa Morales in 2006 are still the best available and consist of a multiple step process (up to 12!) , not a single office visit,  and are intended  to address this problem logically,  with an alogrithm dependent on response to empiric treatment at  each progressive step  to determine a specific diagnosis with  minimal addtional testing needed. Therefore if adherence is an issue or can't be accurately verified,  it's very unlikely the standard evaluation and treatment will be successful here.    Furthermore, response to therapy (other than acute cough suppression, which should only be used short term with avoidance of narcotic containing cough syrups if possible), can be a gradual process for which the patient may perceive immediate benefit.  Unlike going to an eye doctor where the best perscription is almost always the first one and is immediately effective, this is almost never the case in the management of chronic cough syndromes. Therefore the patient needs to commit up front to consistently adhere to recommendations  for up to 6 weeks of therapy directed at the likely underlying problem(s) before the response can be reasonably evaluated.   See instructions for specific recommendations which were reviewed directly with the patient who was given a copy with  highlighter outlining the key components.

## 2014-10-23 ENCOUNTER — Institutional Professional Consult (permissible substitution): Payer: BLUE CROSS/BLUE SHIELD | Admitting: Internal Medicine

## 2014-10-23 ENCOUNTER — Telehealth: Payer: Self-pay | Admitting: Internal Medicine

## 2014-10-23 NOTE — Telephone Encounter (Signed)
Patient called to check on work release forms.  Magda Paganini, do you have these forms?

## 2014-10-24 NOTE — Telephone Encounter (Signed)
Form has been received. It has been completed and just waiting for MW signature. lmtcb x1.

## 2014-10-24 NOTE — Telephone Encounter (Signed)
Pt has not heard from [place] and is discouraged. She needs these papers today.   [phone]

## 2014-10-25 NOTE — Telephone Encounter (Signed)
Called and spoke to pt. Informed pt of the status. Pt aware once forms are signed by MW then we will send them. Pt verbalized understanding and denied any further questions or concerns at this time.

## 2014-11-01 ENCOUNTER — Telehealth: Payer: Self-pay | Admitting: Internal Medicine

## 2014-11-01 NOTE — Telephone Encounter (Signed)
Pt reports that cough was some better and had stopped Percocet but cough increased again and restarted Percocet yesterday.  Pt reports that cough has not stopped for 3 consecutive days.  Instructed pt to continue Delsym and Percocet prn through the weekend and also pt had not started chlorphenarimine.  Instructed pt to start this along with other regimen.  Continue with sinus CT on Monday and we will call with results and re evaluate cough then.  Pt verbalized understanding.

## 2014-11-06 ENCOUNTER — Ambulatory Visit (INDEPENDENT_AMBULATORY_CARE_PROVIDER_SITE_OTHER)
Admission: RE | Admit: 2014-11-06 | Discharge: 2014-11-06 | Disposition: A | Discharge status: Home / Self Care | Payer: BLUE CROSS/BLUE SHIELD | Source: Ambulatory Visit | Attending: Internal Medicine | Admitting: Internal Medicine

## 2014-11-06 DIAGNOSIS — R05 Cough: Secondary | ICD-10-CM | POA: Diagnosis not present

## 2014-11-06 DIAGNOSIS — R058 Other specified cough: Secondary | ICD-10-CM

## 2014-11-07 ENCOUNTER — Telehealth: Payer: Self-pay | Admitting: Internal Medicine

## 2014-11-07 MED ORDER — AMOXICILLIN-POT CLAVULANATE 875-125 MG PO TABS: 1.0000 | ORAL_TABLET | Freq: Two times a day (BID) | ORAL | Status: DC

## 2014-11-07 NOTE — Telephone Encounter (Signed)
Call patient : Study is c/w acute and chronic sinusitis > Augmentin 875 mg take one pill twice daily X 10 days - take at breakfast and supper with large glass of water. It would help reduce the usual side effects (diarrhea and yeast infections) if you ate cultured yogurt at lunch.  Spoke with pt and notified of results per Dr. Melvyn Novas. Pt verbalized understanding and denied any questions. Rx was sent to pharm   The pt tells me that she meant to mention to MW that sometimes when she chews gum (non mint/menthol) she gets choked "goes down the wrong way" and sometimes coughs a lot when she is eating.  She wants further recs for this. Please advise, thanks!

## 2014-11-07 NOTE — Telephone Encounter (Signed)
No medication can treat this - main thing is avoidance and use hard rock candy instead as per gerd diet guidelines

## 2014-11-07 NOTE — Progress Notes (Signed)
Quick Note:  Spoke with pt and notified of results per Dr. Wert. Pt verbalized understanding and denied any questions.  ______ 

## 2014-11-07 NOTE — Telephone Encounter (Signed)
Pt is aware of recs from MW-nothing more needed at this time.

## 2014-11-08 ENCOUNTER — Institutional Professional Consult (permissible substitution): Payer: BLUE CROSS/BLUE SHIELD | Admitting: Internal Medicine

## 2014-11-10 ENCOUNTER — Telehealth: Payer: Self-pay | Admitting: Internal Medicine

## 2014-11-10 NOTE — Telephone Encounter (Signed)
Pt returned call - [phone]

## 2014-11-10 NOTE — Telephone Encounter (Signed)
LMTCB

## 2014-11-10 NOTE — Telephone Encounter (Signed)
Left message to call back  

## 2014-11-13 NOTE — Telephone Encounter (Signed)
Patient says that she will call her ENT (Dr. Benjamine Mola) and see if he wants to see her.  She said that she is taking all the medications exactly the way that she was told to.  She will have Dr. Benjamine Mola send his notes to Dr. Melvyn Novas.  Nothing further needed.

## 2014-11-13 NOTE — Telephone Encounter (Signed)
Most ent's treat with 3 weeks of abx then regroup and it looks like she's only been on them 7 days so I'm happy to send her on to ENT now or offer alternative regroup with tammy asap with all meds in hand because she's on so many different meds that I'm concerned we may not be 100% on the same page as to how/when to take them because we should be able to completely eliminate the cough.  The standardized cough guidelines published in Chest by Lissa Morales in 2006 are still the best available and consist of a multiple step process (up to 12!) , not a single office visi

## 2014-11-13 NOTE — Telephone Encounter (Signed)
Spoke with pt, she is wanting to know if she needs to continue taking the oxycodone.  Pt states that she is still coughing, cough has actually worsened over the weekend.  Pt's cough is productive with some clear/green thick mucus.  Still taking augmentin as well.  Pt states she is wanting to know if an ENT would have a different perspective on her problem.  MW please advise.  Thanks!

## 2014-11-17 ENCOUNTER — Ambulatory Visit (INDEPENDENT_AMBULATORY_CARE_PROVIDER_SITE_OTHER): Payer: BLUE CROSS/BLUE SHIELD | Admitting: Internal Medicine

## 2014-11-17 ENCOUNTER — Telehealth: Payer: Self-pay | Admitting: Internal Medicine

## 2014-11-17 ENCOUNTER — Encounter: Payer: Self-pay | Admitting: Internal Medicine

## 2014-11-17 VITALS — BP 148/84 | HR 58 | Temp 98.2°F | Ht 69.0 in | Wt 230.2 lb

## 2014-11-17 DIAGNOSIS — R058 Other specified cough: Secondary | ICD-10-CM

## 2014-11-17 DIAGNOSIS — R05 Cough: Secondary | ICD-10-CM

## 2014-11-17 MED ORDER — PREDNISONE 10 MG PO TABS: ORAL_TABLET | ORAL | Status: DC

## 2014-11-17 MED ORDER — OXYCODONE-ACETAMINOPHEN 5-325 MG PO TABS: 1.0000 | ORAL_TABLET | ORAL | Status: DC | PRN

## 2014-11-17 MED ORDER — AZITHROMYCIN 250 MG PO TABS: ORAL_TABLET | ORAL | Status: DC

## 2014-11-17 MED ORDER — PANTOPRAZOLE SODIUM 40 MG PO TBEC: 40.0000 mg | DELAYED_RELEASE_TABLET | Freq: Every day | ORAL | Status: DC

## 2014-11-17 MED ORDER — FLUTTER DEVI: Status: DC

## 2014-11-17 NOTE — Telephone Encounter (Signed)
Pt has been scheduled to see MW this morning at 10am. Nothing further was needed.

## 2014-11-17 NOTE — Telephone Encounter (Signed)
See if we can add her on this am with all meds in hand

## 2014-11-17 NOTE — Patient Instructions (Addendum)
zpak Prednisone 10 mg take  4 each am x 2 days,   2 each am x 2 days,  1 each am x 2 days and stop  Take delsym (or mucinex dm 600 mg)  two tsp every 12 hours and supplement if needed with  Percocet  up to 2 every 4 hours to suppress the urge to cough. Swallowing water or using ice chips/non mint and menthol containing candies (such as lifesavers or sugarless jolly ranchers) are also effective.  You should rest your voice and avoid activities that you know make you cough.  Once you have eliminated the cough for 3 straight days try reducing the percocet first,  then the delsym as tolerated.    Use flutter valve whenever you feel need to cough   Continue  Pantoprazole (protonix) 40 mg   Take 30-60 min before first meal of the day and zantac 150  one bedtime until return to office - this is the best way to tell whether stomach acid is contributing to your problem.    Keep appt with Benjamine Mola and return here with all meds in hand if not satisfied after you finish with Dr Benjamine Mola

## 2014-11-17 NOTE — Progress Notes (Signed)
Subjective:    Patient ID: Kristina Mclaughlin, female    DOB: [birth year],    MRN: [id number]    Brief patient profile:  76 yowf light smoker quit early 20's with onset also about the same time  of rhinitis sping > fall rhinitis freq ov's never prednisone / mostly antihistamines some better over the years but tendency to bad colds not related to seasons rx with abx / cough med then better within a couple of weeks to a month before better for a year but starting Nov 2015 onset of head cold while on vacation in [place]  And coughing ever since so referred by Dr Morrie Sheldon to pulmonary office 10/20/14     History of Present Illness  10/20/2014 1st  Pulmonary office visit/ Elisa Sorlie   Chief Complaint  Patient presents with  . Pulmonary Consult    Referred by Dr. Laurance Flatten. Pt c/o cough on and off since Nov 2015. She states cough is worse at night and is occ prod with minimal clear sputum. Sometimes feels as if she can not take in a good, deep breath.   cough worse p supper also can't lie down due to cough and has lots of am drainage but nothing purulent Has had reflux for years maintained on ppi  rec Please see patient coordinator before you leave today  to schedule sinus CT and we will call you with results  First take delsym two tsp every 12 hours and supplement if needed with  Percocet  up to 2 every 4 hours to suppress the urge to cough at all or even clear your throat. Swallowing water or using ice chips/non mint and menthol containing candies (such as lifesavers or sugarless jolly ranchers) are also effective.  You should rest your voice and avoid activities that you know make you cough. Once you have eliminated the cough for 3 straight days try reducing the percocet   then the delsym as tolerated.   Prednisone  Taper off as you plan Protonix (pantoprazole) Take 30-60 min before first meal of the day and take prilosec 30 min before supper and zantac 150 one bedtime plus chlorpheniramine 4 mg x  2 at bedtime (both available over the counter)  until cough is completely gone for at least a week without the need for cough suppression GERD diet  - sinus CT 11/06/2014 >>>  Acute/ chronic sinusitis > augmentin x 20 days     11/17/2014 f/u ov/Sherlonda Flater re: chronic cough "I've made 10 ov's  and no one has helped me" Chief Complaint  Patient presents with  . Acute Visit    Pt states having increased cough for the past wk. In the am she is coughing up minimal green sputum.   cough never resolved but did not use percocet consistently yet ough was some better until 4/11 then 4/12 while on still on augmentin flared again same pattern as before with barking harsh quality 24/7   No obvious day to day or daytime variabilty or assocsob unless coughing  or cp or chest tightness, subjective wheeze overt   hb symptoms. No unusual exp hx or h/o childhood pna/ asthma or knowledge of premature birth.  Sleeping ok without nocturnal  or early am exacerbation  of respiratory  c/o's or need for noct saba. Also denies any obvious fluctuation of symptoms with weather or environmental changes or other aggravating or alleviating factors except as outlined above   Current Medications, Allergies, Complete Past Medical History, Past Surgical History,  Family History, and Social History were reviewed in [employer] record.  ROS  The following are not active complaints unless bolded sore throat, dysphagia, dental problems, itching, sneezing,  nasal congestion or excess/ purulent secretions, ear ache,   fever, chills, sweats, unintended wt loss, pleuritic or exertional cp, hemoptysis,  orthopnea pnd or leg swelling, presyncope, palpitations, heartburn, abdominal pain, anorexia, nausea, vomiting, diarrhea  or change in bowel or urinary habits, change in stools or urine, dysuria,hematuria,  rash, arthralgias, visual complaints, headache, numbness weakness or ataxia or problems with walking or coordination,   change in mood/affect or memory.             Objective:   Physical Exam  Amb wf with hopeless affect in nad   11/17/2014        230  Wt Readings from Last 3 Encounters:  10/20/14 226 lb (102.513 kg)  10/17/14 227 lb (102.967 kg)  10/10/14 225 lb (102.059 kg)    Vital signs reviewed   HEENT: nl dentition, turbinates, and orophanx. Nl external ear canals without cough reflex   NECK :  without JVD/Nodes/TM/ nl carotid upstrokes bilaterally   LUNGS: no acc muscle use, clear to A and P bilaterally without cough on insp or exp maneuvers   CV:  RRR  no s3 or murmur or increase in P2, no edema   ABD:  soft and nontender with nl excursion in the supine position. No bruits or organomegaly, bowel sounds nl  MS:  warm without deformities, calf tenderness, cyanosis or clubbing  SKIN: warm and dry without lesions    NEURO:  alert, approp, no deficits      I personally reviewed images and agree with radiology impression as follows:  CXR:  10/06/14 Small hiatal hernia. No acute cardiopulmonary abnormality seen.     Assessment & Plan:

## 2014-11-17 NOTE — Telephone Encounter (Signed)
Spoke with pt. States that her cough has gotten worse. Reports taking Delsym and Percocet as prescribed. Wants to know what to do from here. Only has 4 tablets of Percocet left.  MW - please advise. Thanks.

## 2014-11-19 ENCOUNTER — Encounter: Payer: Self-pay | Admitting: Internal Medicine

## 2014-11-19 NOTE — Assessment & Plan Note (Signed)
-   sinus CT 11/06/2014 >>>  Acute/ chronic sinusitis > augmentin x 20 days   While still on augmentin relapsed and allergic to all fluroquinolones now requesting which i doubt will help but can't hurt plus will add pred x 6 days  I had an extended discussion with the patient reviewing all relevant studies completed to date and  lasting 15 to 20 minutes of a 25 minute visit on the following ongoing concerns:   The standardized cough guidelines published in Chest by Lissa Morales in 2006 are still the best available and consist of a multiple step process (up to 12!) , not a single office visit,  and are intended  to address this problem logically,  with an alogrithm dependent on response to empiric treatment at  each progressive step  to determine a specific diagnosis with  minimal addtional testing needed. Therefore if adherence is an issue or can't be accurately verified,  it's very unlikely the standard evaluation and treatment will be successful here.    Furthermore, response to therapy (other than acute cough suppression, which should only be used short term with avoidance of narcotic containing cough syrups if possible), can be a gradual process for which the patient may perceive immediate benefit.  Unlike going to an eye doctor where the best perscription is almost always the first one and is immediately effective, this is almost never the case in the management of chronic cough syndromes. Therefore the patient needs to commit up front to consistently adhere to recommendations  for up to 6 weeks of therapy directed at the likely underlying problem(s) before the response can be reasonably evaluated.   Would like her to return here after she completes rx per Dr Benjamine Mola planned.

## 2014-12-04 ENCOUNTER — Institutional Professional Consult (permissible substitution): Payer: BLUE CROSS/BLUE SHIELD | Admitting: Pulmonary Disease

## 2014-12-14 ENCOUNTER — Telehealth: Payer: Self-pay | Admitting: Nurse Practitioner

## 2014-12-14 MED ORDER — NITROFURANTOIN MONOHYD MACRO 100 MG PO CAPS: 100.0000 mg | ORAL_CAPSULE | Freq: Two times a day (BID) | ORAL | Status: DC

## 2014-12-14 NOTE — Telephone Encounter (Signed)
macrobid rx sent to pharmacy

## 2015-01-04 ENCOUNTER — Telehealth: Payer: Self-pay | Admitting: Internal Medicine

## 2015-01-04 NOTE — Telephone Encounter (Signed)
Pt is aware of recs below. She did not want to make appt yet. Nothing further needed

## 2015-01-04 NOTE — Telephone Encounter (Signed)
LMTC x 1  

## 2015-01-04 NOTE — Telephone Encounter (Signed)
Pt states that she took Delsym on morning off 12/31/14 and 1 hr later she noticed facial and lip swelling and had to take Benadryl.  This is the first time this has happened with Delsym but has had similar reactions with Avelox and Quinolones.  Pt wants to know if she needs to see an allergist to have this evaluated.   Also still having issues with cough.  Still taking Protonix and Zantac.  Out of Percocet.  Please advise.

## 2015-01-04 NOTE — Telephone Encounter (Signed)
Plan was Keep appt with [facility] and return here with all meds in hand if not satisfied after you finish with Dr Benjamine Mola  I'd be happy to refer to allergist but they aren't likely going to help with issues of medication intolerance/allergy.  (if she wants to see one instead of return here then that's fine send her to [facility])

## 2015-01-08 ENCOUNTER — Ambulatory Visit: Payer: BLUE CROSS/BLUE SHIELD | Admitting: Internal Medicine

## 2015-02-15 ENCOUNTER — Telehealth: Payer: Self-pay | Admitting: Internal Medicine

## 2015-02-15 NOTE — Telephone Encounter (Signed)
Spoke with pt. She forgot her morning dose of Gabapentin. Wants to know if this will harm her. Advised her that missing one dose should not be an issue but she shouldn't make it a habit because this medication can't be stopped abruptly. She verbalized understanding. Nothing further was needed.

## 2015-02-27 ENCOUNTER — Other Ambulatory Visit (HOSPITAL_COMMUNITY): Payer: Self-pay | Admitting: Obstetrics and Gynecology

## 2015-02-27 DIAGNOSIS — Z1231 Encounter for screening mammogram for malignant neoplasm of breast: Secondary | ICD-10-CM

## 2015-03-08 ENCOUNTER — Ambulatory Visit (HOSPITAL_COMMUNITY)
Admission: RE | Admit: 2015-03-08 | Discharge: 2015-03-08 | Disposition: A | Discharge status: Home / Self Care | Payer: BLUE CROSS/BLUE SHIELD | Source: Ambulatory Visit | Attending: Obstetrics and Gynecology | Admitting: Obstetrics and Gynecology

## 2015-03-08 DIAGNOSIS — Z1231 Encounter for screening mammogram for malignant neoplasm of breast: Secondary | ICD-10-CM | POA: Diagnosis not present

## 2015-06-08 ENCOUNTER — Encounter: Payer: Self-pay | Admitting: Nurse Practitioner

## 2015-06-08 ENCOUNTER — Ambulatory Visit (INDEPENDENT_AMBULATORY_CARE_PROVIDER_SITE_OTHER): Payer: BLUE CROSS/BLUE SHIELD | Admitting: Nurse Practitioner

## 2015-06-08 VITALS — BP 144/88 | HR 73 | Temp 97.1°F | Ht 69.0 in | Wt 234.0 lb

## 2015-06-08 DIAGNOSIS — R079 Chest pain, unspecified: Secondary | ICD-10-CM

## 2015-06-08 NOTE — Progress Notes (Signed)
   Subjective:    Patient ID: Kristina Mclaughlin, female    DOB: May 01, 1951, 64 y.o.   MRN: [id number]  HPI Patient comes in today with 2 compliants: - Had allergy shots yesterday at [place] and left arm is very sore and has knot on it- usually does not do this. - Pain under left breast and wondered down left arm last night. Pain was achy pain and rates 2/3 10- lasted at least an hour- would increase if she layed on left side. Also happened again early this morning- denies SOB or dizziness.  * Just saw PCP in  on Wednesday of this week   Review of Systems  Constitutional: Negative.   HENT: Negative.   Respiratory: Negative.   Cardiovascular: Positive for chest pain.  Genitourinary: Negative.   Musculoskeletal: Negative.   Neurological: Negative.   Psychiatric/Behavioral: Negative.   All other systems reviewed and are negative.      Objective:   Physical Exam  Constitutional: She is oriented to person, place, and time. She appears well-developed and well-nourished.  Cardiovascular: Normal rate and regular rhythm.   Murmur (2//6 systolic) heard. Pulmonary/Chest: Effort normal and breath sounds normal.  Neurological: She is alert and oriented to person, place, and time.  Skin: Skin is warm and dry.  echymosis left upper arm from injection- no pain on palpation  Psychiatric: She has a normal mood and affect. Her behavior is normal. Judgment and thought content normal.   BP 144/88 mmHg  Pulse 73  Temp(Src) 97.1 F (36.2 C) (Oral)  Ht 5\' 9"  (1.753 m)  Wt 234 lb (106.142 kg)  BMI 34.54 kg/m2   EKG- Kerry Hough, FNP      Assessment & Plan:  1. Chest pain, unspecified chest pain type No strenous activity If continues need to go to ER - EKG 12-Lead   Mary-Margaret Hassell Done, FNP

## 2015-06-08 NOTE — Patient Instructions (Signed)

## 2015-09-13 ENCOUNTER — Ambulatory Visit (INDEPENDENT_AMBULATORY_CARE_PROVIDER_SITE_OTHER): Payer: BLUE CROSS/BLUE SHIELD | Admitting: Family Medicine

## 2015-09-13 ENCOUNTER — Encounter: Payer: Self-pay | Admitting: Family Medicine

## 2015-09-13 VITALS — BP 152/85 | HR 66 | Temp 98.4°F | Ht 69.0 in | Wt 230.0 lb

## 2015-09-13 DIAGNOSIS — J4531 Mild persistent asthma with (acute) exacerbation: Secondary | ICD-10-CM

## 2015-09-13 MED ORDER — DOXYCYCLINE HYCLATE 100 MG PO TABS: 100.0000 mg | ORAL_TABLET | Freq: Two times a day (BID) | ORAL | Status: DC

## 2015-09-13 MED ORDER — PREDNISONE 20 MG PO TABS: ORAL_TABLET | ORAL | Status: DC

## 2015-09-13 NOTE — Progress Notes (Signed)
BP 152/85 mmHg  Pulse 66  Temp(Src) 98.4 F (36.9 C) (Oral)  Ht 5\' 9"  (1.753 m)  Wt 230 lb (104.327 kg)  BMI 33.95 kg/m2   Subjective:    Patient ID: Kristina Mclaughlin, female    DOB: May 30, 1951, 65 y.o.   MRN: [id number]  HPI: Kristina Mclaughlin is a 65 y.o. female presenting on 09/13/2015 for Cough and achey   HPI Cough and congestion and chest congestion and achiness Patient has been having cough and congestion and achiness and sinus congestion and pressure for the past 2 weeks. She was given azithromycin which she finished a week and a half ago. She has been having a lot of coughing spells especially worse at night. She is using Mucinex and her Jeanette Caprice for her asthma and a nasal steroid spray. These are helping some but it has gotten worse over the past couple days and she feels like it is going down into her chest. She is especially having a lot of coughing spells that are worse at night. She is currently being treated for severe allergies with shots but has stopped it while she is going through this current illness. She denies any wheezing or shortness of breath or fevers or chills  Relevant past medical, surgical, family and social history reviewed and updated as indicated. Interim medical history since our last visit reviewed. Allergies and medications reviewed and updated.  Review of Systems  Constitutional: Negative for fever and chills.  HENT: Positive for congestion, postnasal drip, rhinorrhea, sinus pressure and sore throat. Negative for ear discharge, ear pain and sneezing.   Eyes: Negative for pain, redness and visual disturbance.  Respiratory: Positive for cough. Negative for chest tightness and shortness of breath.   Cardiovascular: Negative for chest pain and leg swelling.  Genitourinary: Negative for dysuria and difficulty urinating.  Musculoskeletal: Negative for back pain and gait problem.  Skin: Negative for rash.  Neurological: Negative for light-headedness and  headaches.  Psychiatric/Behavioral: Negative for behavioral problems and agitation.  All other systems reviewed and are negative.   Per HPI unless specifically indicated above     Medication List       This list is accurate as of: 09/13/15  5:34 PM.  Always use your most recent med list.               BIOTIN PO  Take 1 capsule by mouth daily.     BREO ELLIPTA 200-25 MCG/INH Aepb  Generic drug:  Fluticasone Furoate-Vilanterol  USE 1 PUFF ONCE A DAY     CALCIUM + D PO  Take by mouth.     CINNAMON PO  Take 1 capsule by mouth daily.     DEXILANT 60 MG capsule  Generic drug:  dexlansoprazole  Take 1 capsule by mouth daily.     doxycycline 100 MG tablet  Commonly known as:  VIBRA-TABS  Take 1 tablet (100 mg total) by mouth 2 (two) times daily.     GLUCOSAMINE 1500 COMPLEX PO  Take 1 tablet by mouth daily.     levocetirizine 5 MG tablet  Commonly known as:  XYZAL  TAKE 1 TABLET ONCE A DAY IN THE EVENING     MULTI-VITAMIN PO  Take 1 tablet by mouth daily.     predniSONE 20 MG tablet  Commonly known as:  DELTASONE  2 po at same time daily for 5 days     PROAIR HFA 108 (90 Base) MCG/ACT inhaler  Generic drug:  albuterol  INHALE 2 PUFFS INTO THE LUNGS EVERY 6 (SIX) HOURS AS NEEDED FOR WHEEZING OR SHORTNESS OF BREATH.     PROBIOTIC DAILY PO  Take 1 tablet by mouth daily.     temazepam 15 MG capsule  Commonly known as:  RESTORIL  Take 15 mg by mouth at bedtime as needed.     valsartan-hydrochlorothiazide 320-25 MG tablet  Commonly known as:  DIOVAN-HCT  Take 1 tablet by mouth daily.           Objective:    BP 152/85 mmHg  Pulse 66  Temp(Src) 98.4 F (36.9 C) (Oral)  Ht 5\' 9"  (1.753 m)  Wt 230 lb (104.327 kg)  BMI 33.95 kg/m2  Wt Readings from Last 3 Encounters:  09/13/15 230 lb (104.327 kg)  06/08/15 234 lb (106.142 kg)  11/17/14 230 lb 3.2 oz (104.418 kg)    Physical Exam  Constitutional: She is oriented to person, place, and time. She appears  well-developed and well-nourished. No distress.  HENT:  Right Ear: Tympanic membrane, external ear and ear canal normal.  Left Ear: Tympanic membrane, external ear and ear canal normal.  Nose: Mucosal edema and rhinorrhea present. No epistaxis. Right sinus exhibits no maxillary sinus tenderness and no frontal sinus tenderness. Left sinus exhibits no maxillary sinus tenderness and no frontal sinus tenderness.  Mouth/Throat: Uvula is midline and mucous membranes are normal. Posterior oropharyngeal edema and posterior oropharyngeal erythema present. No oropharyngeal exudate or tonsillar abscesses.  Eyes: Conjunctivae and EOM are normal.  Neck: Neck supple. No thyromegaly present.  Cardiovascular: Normal rate, regular rhythm, normal heart sounds and intact distal pulses.   No murmur heard. Pulmonary/Chest: Effort normal. No respiratory distress. She has no decreased breath sounds. She has no wheezes. She has rhonchi in the right upper field and the left upper field.  Musculoskeletal: Normal range of motion. She exhibits no edema or tenderness.  Lymphadenopathy:    She has no cervical adenopathy.  Neurological: She is alert and oriented to person, place, and time. Coordination normal.  Skin: Skin is warm and dry. No rash noted. She is not diaphoretic.  Psychiatric: She has a normal mood and affect. Her behavior is normal.  Nursing note and vitals reviewed.     Assessment & Plan:   Problem List Items Addressed This Visit    None    Visit Diagnoses    Asthma with acute exacerbation, mild persistent    -  Primary    Relevant Medications    doxycycline (VIBRA-TABS) 100 MG tablet    predniSONE (DELTASONE) 20 MG tablet        Follow up plan: Return if symptoms worsen or fail to improve.  Counseling provided for all of the vaccine components No orders of the defined types were placed in this encounter.    Caryl Pina, MD [facility] 09/13/2015, 5:34  PM

## 2015-09-25 ENCOUNTER — Telehealth: Payer: Self-pay | Admitting: Family Medicine

## 2015-09-25 NOTE — Telephone Encounter (Signed)
Patient advised of recommendations.  

## 2015-09-25 NOTE — Telephone Encounter (Signed)
Pt was treated with antibiotic and prednisone. Cough may last for several weeks. No more antibiotics at this time. Continue with mucinex and Breo. If continues pt needs to make follow up with Dr. Warrick Parisian.

## 2015-09-25 NOTE — Telephone Encounter (Signed)
Patient aware.

## 2015-09-27 ENCOUNTER — Telehealth: Payer: Self-pay | Admitting: Family Medicine

## 2015-09-27 MED ORDER — OSELTAMIVIR PHOSPHATE 75 MG PO CAPS: 75.0000 mg | ORAL_CAPSULE | Freq: Two times a day (BID) | ORAL | Status: DC

## 2015-09-27 NOTE — Telephone Encounter (Signed)
Go ahead and send her Tamiflu 75 mg twice a day for 5 days

## 2015-09-27 NOTE — Telephone Encounter (Signed)
Pt's husband was seen with you yesterday in office flu was negative but you rx'd tamiflu, pt wants to know if she needs tamiflu? She hasn't had any fevers but is very congested.

## 2015-10-29 ENCOUNTER — Encounter: Payer: Self-pay | Admitting: Pediatrics

## 2015-10-29 ENCOUNTER — Ambulatory Visit (INDEPENDENT_AMBULATORY_CARE_PROVIDER_SITE_OTHER): Payer: BLUE CROSS/BLUE SHIELD | Admitting: Pediatrics

## 2015-10-29 VITALS — BP 119/75 | HR 80 | Temp 97.4°F | Ht 69.0 in | Wt 225.6 lb

## 2015-10-29 DIAGNOSIS — J309 Allergic rhinitis, unspecified: Secondary | ICD-10-CM | POA: Insufficient documentation

## 2015-10-29 DIAGNOSIS — I10 Essential (primary) hypertension: Secondary | ICD-10-CM | POA: Diagnosis not present

## 2015-10-29 DIAGNOSIS — R05 Cough: Secondary | ICD-10-CM | POA: Diagnosis not present

## 2015-10-29 DIAGNOSIS — R053 Chronic cough: Secondary | ICD-10-CM

## 2015-10-29 HISTORY — DX: Essential (primary) hypertension: I10

## 2015-10-29 MED ORDER — FLUTICASONE PROPIONATE 50 MCG/ACT NA SUSP: 2.0000 | Freq: Every day | NASAL | Status: DC

## 2015-10-29 NOTE — Progress Notes (Signed)
    Subjective:    Patient ID: Kristina Mclaughlin, female    DOB: [birth year], 65 y.o.   MRN: [id number]  CC: Generalized Body Aches; Fatigue; Nasal Congestion; and Shortness of Breath   HPI: Kristina Mclaughlin is a 65 y.o. female presenting for Generalized Body Aches; Fatigue; Nasal Congestion; and Shortness of Breath  Recently switched from Breo to Advair by allergy. Started on cefdinir for sinus infection last week, plan for 10 days treatment about halfway through Pt isnt sure if it is helping or not Has had ongoing cough Sinus symptoms and cough symptoms have been coming and going for past 1.5 years.  Now present for apprx 3-4 weeks. Decreased energy, poor appetite for last 4 days Starts with breakfast, not hungry Since antibiotic started appetite has been decreased Seen by pulm one year ago, then followed by ENT. She thinks gabapentin taper at one point did help with cough Not regularly doing sinus rinses No fevers  Breathing: has needed rescue inhaler a couple times the past week. Not sure if she is wheezing Cough gets worse at night H/o asthma and bad allergies  Relevant past medical, surgical, family and social history reviewed and updated as indicated. Interim medical history since our last visit reviewed. Allergies and medications reviewed and updated.    ROS: Per HPI unless specifically indicated above  History  Smoking status  . Never Smoker   Smokeless tobacco  . Never Used        Objective:    BP 119/75 mmHg  Pulse 80  Temp(Src) 97.4 F (36.3 C) (Oral)  Ht 5\' 9"  (1.753 m)  Wt 225 lb 9.6 oz (102.331 kg)  BMI 33.30 kg/m2  SpO2 96%  Wt Readings from Last 3 Encounters:  10/29/15 225 lb 9.6 oz (102.331 kg)  09/13/15 230 lb (104.327 kg)  06/08/15 234 lb (106.142 kg)     Gen: NAD, alert, cooperative with exam, NCAT, congested EYES: EOMI, no scleral injection or icterus ENT:  TMs dull gray b/l, slightly splayed LR b/l, OP without erythema. Red, irritated  intranasal septum R side LYMPH: no cervical LAD CV: NRRR, normal S1/S2, no murmur, distal pulses 2+ b/l Resp: CTABL, no wheezes, normal WOB Abd: +BS, soft, NTND.  Ext: No edema, warm Neuro: Alert and oriented     Assessment & Plan:    Yuliza was seen today for ongoing sinus symptoms, chronic cough. No fevers. On cefdinir past few days with minimal improvement per pt. Continue abx, start flonase, sinus rinses at least BID, f/u with Dr. Benjamine Mola with ENT.  Diagnoses and all orders for this visit:  Allergic rhinitis, unspecified allergic rhinitis type -     fluticasone (FLONASE) 50 MCG/ACT nasal spray; Place 2 sprays into both nostrils daily.  Chronic cough  HTN Stable, continue meds  Follow up plan: Return if symptoms worsen or fail to improve.  Assunta Found, MD [facility] 10/29/2015, 2:23 PM

## 2015-10-29 NOTE — Patient Instructions (Signed)
neti pot twice a day Flonase, one spray each side after netipot use Continue antibiotic Call for appt with Dr. Benjamine Mola, let us know if you need help scheduling

## 2016-01-10 ENCOUNTER — Encounter: Payer: Self-pay | Admitting: Family Medicine

## 2016-03-25 ENCOUNTER — Other Ambulatory Visit: Payer: Self-pay

## 2016-03-25 DIAGNOSIS — J309 Allergic rhinitis, unspecified: Secondary | ICD-10-CM

## 2016-03-25 MED ORDER — FLUTICASONE PROPIONATE 50 MCG/ACT NA SUSP: 2.0000 | Freq: Every day | NASAL | 0 refills | Status: DC

## 2016-04-03 ENCOUNTER — Encounter: Payer: Self-pay | Admitting: Family Medicine

## 2016-05-05 ENCOUNTER — Other Ambulatory Visit: Payer: Self-pay | Admitting: Registered Nurse

## 2016-05-05 DIAGNOSIS — Z9889 Other specified postprocedural states: Secondary | ICD-10-CM

## 2016-05-05 DIAGNOSIS — N644 Mastodynia: Secondary | ICD-10-CM

## 2016-05-07 ENCOUNTER — Other Ambulatory Visit: Payer: Self-pay | Admitting: Internal Medicine

## 2016-05-07 DIAGNOSIS — N644 Mastodynia: Secondary | ICD-10-CM

## 2016-05-07 DIAGNOSIS — N63 Unspecified lump in unspecified breast: Secondary | ICD-10-CM

## 2016-05-07 DIAGNOSIS — Z9889 Other specified postprocedural states: Secondary | ICD-10-CM

## 2016-05-12 ENCOUNTER — Other Ambulatory Visit: Payer: BLUE CROSS/BLUE SHIELD

## 2016-05-15 ENCOUNTER — Ambulatory Visit
Admission: RE | Admit: 2016-05-15 | Discharge: 2016-05-15 | Disposition: A | Discharge status: Home / Self Care | Payer: BLUE CROSS/BLUE SHIELD | Source: Ambulatory Visit | Attending: Internal Medicine | Admitting: Internal Medicine

## 2016-05-15 DIAGNOSIS — Z9889 Other specified postprocedural states: Secondary | ICD-10-CM

## 2016-05-15 DIAGNOSIS — N644 Mastodynia: Secondary | ICD-10-CM

## 2016-05-15 DIAGNOSIS — N63 Unspecified lump in unspecified breast: Secondary | ICD-10-CM

## 2016-05-22 ENCOUNTER — Other Ambulatory Visit: Payer: Self-pay | Admitting: Pediatrics

## 2016-05-22 DIAGNOSIS — J309 Allergic rhinitis, unspecified: Secondary | ICD-10-CM

## 2016-07-15 ENCOUNTER — Encounter: Payer: Self-pay | Admitting: Family Medicine

## 2016-07-15 ENCOUNTER — Ambulatory Visit (INDEPENDENT_AMBULATORY_CARE_PROVIDER_SITE_OTHER): Payer: BLUE CROSS/BLUE SHIELD | Admitting: Family Medicine

## 2016-07-15 VITALS — BP 130/83 | HR 65 | Temp 97.0°F | Ht 69.0 in | Wt 237.0 lb

## 2016-07-15 DIAGNOSIS — M778 Other enthesopathies, not elsewhere classified: Secondary | ICD-10-CM | POA: Diagnosis not present

## 2016-07-15 MED ORDER — PREDNISONE 20 MG PO TABS: ORAL_TABLET | ORAL | 0 refills | Status: DC

## 2016-07-15 NOTE — Progress Notes (Signed)
BP 130/83   Pulse 65   Temp 97 F (36.1 C) (Oral)   Ht 5\' 9"  (1.753 m)   Wt 237 lb (107.5 kg)   BMI 35.00 kg/m    Subjective:    Patient ID: Kristina Mclaughlin, female    DOB: [birth year], 65 y.o.   MRN: [id number]  HPI: Kristina Mclaughlin is a 65 y.o. female presenting on 07/15/2016 for Left wrist pain (no known injury)   HPI Left wrist pain Patient is coming in for left wrist pain that's been going on intermittently for quite some time but is really worsened over the past 3 days. She is having inflammation and pain on that lateral aspect of her wrist and started wearing a splint so that she could not move it. It mainly hurts when she moves it. She denies any fevers or chills or overlying redness or warmth. The pain is worse with flexion of the wrist and ulnar flexion as well. She denies any numbness or weakness.  Relevant past medical, surgical, family and social history reviewed and updated as indicated. Interim medical history since our last visit reviewed. Allergies and medications reviewed and updated.  Review of Systems  Constitutional: Negative for chills and fever.  HENT: Negative for congestion, ear discharge and ear pain.   Respiratory: Negative for chest tightness and shortness of breath.   Cardiovascular: Negative for chest pain and leg swelling.  Genitourinary: Negative for difficulty urinating and dysuria.  Musculoskeletal: Positive for arthralgias and joint swelling. Negative for back pain and gait problem.  Skin: Negative for color change and rash.  Neurological: Negative for light-headedness and headaches.  Psychiatric/Behavioral: Negative for agitation and behavioral problems.  All other systems reviewed and are negative.   Per HPI unless specifically indicated above      Objective:    BP 130/83   Pulse 65   Temp 97 F (36.1 C) (Oral)   Ht 5\' 9"  (1.753 m)   Wt 237 lb (107.5 kg)   BMI 35.00 kg/m   Wt Readings from Last 3 Encounters:  07/15/16 237 lb  (107.5 kg)  10/29/15 225 lb 9.6 oz (102.3 kg)  09/13/15 230 lb (104.3 kg)    Physical Exam  Constitutional: She is oriented to person, place, and time. She appears well-developed and well-nourished. No distress.  Eyes: Conjunctivae are normal.  Cardiovascular: Normal rate, regular rhythm, normal heart sounds and intact distal pulses.   No murmur heard. Pulmonary/Chest: Effort normal and breath sounds normal. No respiratory distress. She has no wheezes.  Musculoskeletal: Normal range of motion. She exhibits no edema.       Left wrist: She exhibits tenderness (Medial wrist tenderness and swelling, no erythema or warmth, worse with ulnar flexion and wrist flexion) and swelling. She exhibits normal range of motion, no bony tenderness, no deformity and no laceration.  Neurological: She is alert and oriented to person, place, and time. Coordination normal.  Skin: Skin is warm and dry. No rash noted. She is not diaphoretic.  Psychiatric: She has a normal mood and affect. Her behavior is normal.  Nursing note and vitals reviewed.     Assessment & Plan:   Problem List Items Addressed This Visit    None    Visit Diagnoses    Tendinitis of left wrist    -  Primary   Left medial tendinitis, we'll do prednisone and Ace bandage and gentle stretching and after prednisone NSAIDs, return if worsens or does not improve   Relevant  Medications   predniSONE (DELTASONE) 20 MG tablet       Follow up plan: Return if symptoms worsen or fail to improve.  Counseling provided for all of the vaccine components No orders of the defined types were placed in this encounter.   Caryl Pina, MD [facility] 07/15/2016, 4:41 PM

## 2016-07-31 ENCOUNTER — Other Ambulatory Visit: Payer: Self-pay | Admitting: Dermatology

## 2016-12-08 ENCOUNTER — Other Ambulatory Visit: Payer: Self-pay | Admitting: Registered Nurse

## 2016-12-08 DIAGNOSIS — Z1231 Encounter for screening mammogram for malignant neoplasm of breast: Secondary | ICD-10-CM

## 2016-12-26 ENCOUNTER — Ambulatory Visit: Payer: BLUE CROSS/BLUE SHIELD

## 2017-01-06 ENCOUNTER — Ambulatory Visit: Payer: BLUE CROSS/BLUE SHIELD

## 2017-03-26 ENCOUNTER — Other Ambulatory Visit: Payer: Self-pay | Admitting: Registered Nurse

## 2017-03-26 DIAGNOSIS — Z9889 Other specified postprocedural states: Secondary | ICD-10-CM

## 2017-03-26 DIAGNOSIS — N6459 Other signs and symptoms in breast: Secondary | ICD-10-CM

## 2017-03-31 ENCOUNTER — Other Ambulatory Visit: Payer: Self-pay | Admitting: Registered Nurse

## 2017-03-31 DIAGNOSIS — R922 Inconclusive mammogram: Secondary | ICD-10-CM

## 2017-03-31 DIAGNOSIS — Z9889 Other specified postprocedural states: Secondary | ICD-10-CM

## 2017-04-03 ENCOUNTER — Encounter: Payer: Self-pay | Admitting: Family Medicine

## 2017-04-03 ENCOUNTER — Ambulatory Visit
Admission: RE | Admit: 2017-04-03 | Discharge: 2017-04-03 | Disposition: A | Discharge status: Home / Self Care | Payer: BLUE CROSS/BLUE SHIELD | Source: Ambulatory Visit | Attending: Registered Nurse | Admitting: Registered Nurse

## 2017-04-03 ENCOUNTER — Ambulatory Visit: Payer: BLUE CROSS/BLUE SHIELD

## 2017-04-03 DIAGNOSIS — R922 Inconclusive mammogram: Secondary | ICD-10-CM

## 2017-04-03 DIAGNOSIS — Z9889 Other specified postprocedural states: Secondary | ICD-10-CM

## 2017-09-18 ENCOUNTER — Encounter: Payer: Self-pay | Admitting: Family Medicine

## 2018-01-22 ENCOUNTER — Encounter: Payer: Self-pay | Admitting: Family Medicine

## 2018-01-22 ENCOUNTER — Ambulatory Visit (INDEPENDENT_AMBULATORY_CARE_PROVIDER_SITE_OTHER): Payer: No Typology Code available for payment source

## 2018-01-22 ENCOUNTER — Ambulatory Visit (INDEPENDENT_AMBULATORY_CARE_PROVIDER_SITE_OTHER): Payer: No Typology Code available for payment source | Admitting: Family Medicine

## 2018-01-22 VITALS — BP 152/92 | HR 78 | Temp 97.6°F | Ht 69.0 in | Wt 225.0 lb

## 2018-01-22 DIAGNOSIS — M25562 Pain in left knee: Secondary | ICD-10-CM

## 2018-01-22 DIAGNOSIS — G8929 Other chronic pain: Secondary | ICD-10-CM | POA: Diagnosis not present

## 2018-01-22 DIAGNOSIS — K649 Unspecified hemorrhoids: Secondary | ICD-10-CM | POA: Insufficient documentation

## 2018-01-22 MED ORDER — MELOXICAM 15 MG PO TABS: 7.5000 mg | ORAL_TABLET | Freq: Every day | ORAL | 0 refills | Status: DC

## 2018-01-22 NOTE — Patient Instructions (Addendum)
I suspect that you are having discomfort related to meniscal injury and subsequent Baker's cyst.  See below for further information about Baker's cyst.  You had an x-ray of your knee performed today which demonstrated no substantial degenerative changes or abnormalities.  I have referred you to orthopedics for further evaluation and management.  In the meantime, I have prescribed you meloxicam to help with pain and inflammation.   Please follow up with Dr Maudie Mercury for your blood pressure and other healthcare needs.  You have prescribed a nonsteroidal anti-inflammatory drug (NSAID) today, Meloxicam. This will help with your pain and inflammation. Please do not take any other NSAIDs (ibuprofen/Motrin/Advil, naproxen/Aleve, meloxicam/Mobic, Voltaren/diclofenac). Please make sure to eat a meal when taking this medication.   Caution:  If you have a history of acid reflux/indigestion, I recommend that you take an antacid (such as Prilosec, Prevacid) daily while on the NSAID.  If you have a history of bleeding disorder, gastric ulcer, are on a blood thinner (like warfarin/Coumadin, Xarelto, Eliquis, etc) please do not take NSAID.  If you have ever had a heart attack, you should not take NSAIDs.   Baker Cyst A Baker cyst, also called a popliteal cyst, is a sac-like growth that forms at the back of the knee. The cyst forms when the fluid-filled sac (bursa) that cushions the knee joint becomes enlarged. The bursa that becomes a Baker cyst is located at the back of the knee joint. What are the causes? In most cases, a Baker cyst results from another knee problem that causes swelling inside the knee. This makes the fluid inside the knee joint (synovial fluid) flow into the bursa behind the knee, causing the bursa to enlarge. What increases the risk? You may be more likely to develop a Baker cyst if you already have a knee problem, such as:  A tear in cartilage that cushions the knee joint (meniscal  tear).  A tear in the tissues that connect the bones of the knee joint (ligament tear).  Knee swelling from osteoarthritis, rheumatoid arthritis, or gout.  What are the signs or symptoms? A Baker cyst does not always cause symptoms. A lump behind the knee may be the only sign of the condition. The lump may be painful, especially when the knee is straightened. If the lump is painful, the pain may come and go. The knee may also be stiff. Symptoms may quickly get more severe if the cyst breaks open (ruptures). If your cyst ruptures, signs and symptoms may affect the knee and the back of the lower leg (calf) and may include:  Sudden or worsening pain.  Swelling.  Bruising.  How is this diagnosed? This condition may be diagnosed based on your symptoms and medical history. Your health care provider will also do a physical exam. This may include:  Feeling the cyst to check whether it is tender.  Checking your knee for signs of another knee condition that causes swelling.  You may have imaging tests, such as:  X-rays.  MRI.  Ultrasound.  How is this treated? A Baker cyst that is not painful may go away without treatment. If the cyst gets large or painful, it will likely get better if the underlying knee problem is treated. Treatment for a Baker cyst may include:  Resting.  Keeping weight off of the knee. This means not leaning on the knee to support your body weight.  NSAIDs to reduce pain and swelling.  A procedure to drain the fluid from the cyst  with a needle (aspiration). You may also get an injection of a medicine that reduces swelling (steroid).  Surgery. This may be needed if other treatments do not work. This usually involves correcting knee damage and removing the cyst.  Follow these instructions at home:  Take over-the-counter and prescription medicines only as told by your health care provider.  Rest and return to your normal activities as told by your health care  provider. Avoid activities that make pain or swelling worse. Ask your health care provider what activities are safe for you.  Keep all follow-up visits as told by your health care provider. This is important. Contact a health care provider if:  You have knee pain, stiffness, or swelling that does not get better. Get help right away if:  You have sudden or worsening pain and swelling in your calf area. This information is not intended to replace advice given to you by your health care provider. Make sure you discuss any questions you have with your health care provider. Document Released: 07/21/2005 Document Revised: 04/10/2016 Document Reviewed: 04/10/2016 Elsevier Interactive Patient Education  2018 [employer].

## 2018-01-22 NOTE — Progress Notes (Signed)
Subjective: CC: knee pain PCP: Jani Gravel, MD EPP:IRJJOAC TERIA KHACHATRYAN is a 67 y.o. female presenting to clinic today for:  1. Knee pain Patient reports an acute flare of her chronic left-sided knee pain.  She notes that she sustained a mechanical fall 25 years ago where she fell directly onto bilateral knees on concrete.  She had x-rays obtained at that time which revealed no acute fractures.  She had no repeated imaging since that time.  She notes that she has been taking ibuprofen 800 mg nightly intermittently for the last couple of weeks during the flare.  Sometimes she forgets and the pain becomes more prominent.  She reports that activity such as climbing up stairs or walking up an incline seem to worsen the pain.  She points to the posterior aspect of the left knee as the source of most of pain.  She feels that the knee appears to be more swollen compared to the right.  Denies any lower extreme any weakness, numbness, tingling, discoloration.  No history of renal disease.  She does have a history of hypertension.  She has never seen orthopedic for this issue but she has had a corticosteroid injection into the knee in the past.   ROS: Per HPI  Allergies  Allergen Reactions  . Avelox Abc [Moxifloxacin] Anaphylaxis  . Lovenox [Enoxaparin Sodium] Anaphylaxis  . Quinolones Hives and Shortness Of Breath  . Mucinex Sinus-Max [Phenylephrine-Apap-Guaifenesin] Swelling   Past Medical History:  Diagnosis Date  . Hypertension     Current Outpatient Medications:  .  amLODipine (NORVASC) 2.5 MG tablet, Take 2.5 mg by mouth daily., Disp: , Rfl:  .  BIOTIN PO, Take 1 capsule by mouth daily., Disp: , Rfl:  .  Calcium Carbonate-Vitamin D (CALCIUM + D PO), Take by mouth., Disp: , Rfl:  .  CINNAMON PO, Take 1 capsule by mouth daily., Disp: , Rfl:  .  DEXILANT 60 MG capsule, Take 1 capsule by mouth daily., Disp: , Rfl: 2 .  fluticasone (FLONASE) 50 MCG/ACT nasal spray, PLACE 2 SPRAYS INTO BOTH  NOSTRILS DAILY., Disp: 16 g, Rfl: 0 .  Glucosamine-Chondroit-Vit C-Mn (GLUCOSAMINE 1500 COMPLEX PO), Take 1 tablet by mouth daily., Disp: , Rfl:  .  levocetirizine (XYZAL) 5 MG tablet, TAKE 1 TABLET ONCE A DAY IN THE EVENING, Disp: , Rfl: 3 .  Multiple Vitamin (MULTI-VITAMIN PO), Take 1 tablet by mouth daily. , Disp: , Rfl:  .  predniSONE (DELTASONE) 20 MG tablet, 2 po at same time daily for 5 days, Disp: 10 tablet, Rfl: 0 .  PROAIR HFA 108 (90 BASE) MCG/ACT inhaler, INHALE 2 PUFFS INTO THE LUNGS EVERY 6 (SIX) HOURS AS NEEDED FOR WHEEZING OR SHORTNESS OF BREATH., Disp: 8.5 Inhaler, Rfl: 2 .  Probiotic Product (PROBIOTIC DAILY PO), Take 1 tablet by mouth daily. , Disp: , Rfl:  .  temazepam (RESTORIL) 15 MG capsule, Take 15 mg by mouth at bedtime as needed. , Disp: , Rfl:  .  valsartan-hydrochlorothiazide (DIOVAN-HCT) 320-25 MG per tablet, Take 1 tablet by mouth daily. , Disp: , Rfl:    Social History   Socioeconomic History  . Marital status: Married    Spouse name: Not on file  . Number of children: Not on file  . Years of education: Not on file  . Highest education level: Not on file  Occupational History  . Occupation: Garment/textile technologist Needs  . Financial resource strain: Not on file  . Food insecurity:  Worry: Not on file    Inability: Not on file  . Transportation needs:    Medical: Not on file    Non-medical: Not on file  Tobacco Use  . Smoking status: Never Smoker  . Smokeless tobacco: Never Used  Substance and Sexual Activity  . Alcohol use: No    Alcohol/week: 0.0 oz  . Drug use: No  . Sexual activity: Not on file  Lifestyle  . Physical activity:    Days per week: Not on file    Minutes per session: Not on file  . Stress: Not on file  Relationships  . Social connections:    Talks on phone: Not on file    Gets together: Not on file    Attends religious service: Not on file    Active member of club or organization: Not on file    Attends meetings of clubs or  organizations: Not on file    Relationship status: Not on file  . Intimate partner violence:    Fear of current or ex partner: Not on file    Emotionally abused: Not on file    Physically abused: Not on file    Forced sexual activity: Not on file  Other Topics Concern  . Not on file  Social History Narrative  . Not on file   Family History  Problem Relation Age of Onset  . Bladder Cancer Mother   . Allergies Mother   . Allergies Sister     Objective: Office vital signs reviewed. BP (!) 155/96   Pulse 78   Temp 97.6 F (36.4 C) (Oral)   Ht 5\' 9"  (1.753 m)   Wt 225 lb (102.1 kg)   BMI 33.23 kg/m   Physical Examination:  General: Awake, alert, obese, No acute distress Extremities: warm, well perfused, No edema, cyanosis or clubbing; +2 pulses bilaterally MSK: normal gait and normal station  Right knee: Patient has full active range of motion.  No visible or palpable abnormalities.  Left knee: Patient has full active range of motion.  Mild swelling of the left knee appreciated.  No associated erythema or increased warmth.  No tenderness to palpation to the patellar tendon, quad tendon or patella.  No tenderness to palpation to the joint line or anterior tibial tuberosity.  She does have mild discomfort with palpation to the posterior popliteal fossa.  There is a palpable fullness here as well.  Positive Thessaly. Skin: dry; intact; no rashes or lesions Neuro: 5/5 LE Strength and light touch sensation grossly intact  No results found.  Assessment/ Plan: 67 y.o. female   1. Chronic pain of left knee Physical exam was remarkable for mild swelling of the left knee.  No evidence of septic joint.  I suspect that mild swelling is related to meniscal injury with subsequent Baker's cyst formation given the fullness appreciated in the posterior popliteal fossa.  She does have a positive Thessaly.  X-rays were obtained today and demonstrated no significant degenerative changes or joint  space narrowing upon personal review.  Formal review by radiologist still pending.  I reviewed home care instructions with the patient.  Meloxicam 7.5 to 50 mg daily with food and plenty of water prescribed as needed.  Referral to orthopedic surgery per patient's request has been placed.  Would consider eval under ultrasound and could consider corticosteroid injection versus aspiration pending findings.  Will cc chart to PCP, Dr Maudie Mercury at [facility] in [place]. - Ambulatory referral to Orthopedic Surgery - DG  Knee 1-2 Views Left; Future   Orders Placed This Encounter  Procedures  . DG Knee 1-2 Views Left    Standing Status:   Future    Standing Expiration Date:   03/24/2019    Order Specific Question:   Reason for Exam (SYMPTOM  OR DIAGNOSIS REQUIRED)    Answer:   acute on chronic left knee pain; hx fall 25 years ago    Order Specific Question:   Preferred imaging location?    Answer:   Internal  . Ambulatory referral to Orthopedic Surgery    Referral Priority:   Routine    Referral Type:   Surgical    Referral Reason:   Specialty Services Required    Requested Specialty:   Orthopedic Surgery    Number of Visits Requested:   1   Meds ordered this encounter  Medications  . meloxicam (MOBIC) 15 MG tablet    Sig: Take 0.5-1 tablets (7.5-15 mg total) by mouth daily. If needed for knee pain    Dispense:  30 tablet    Refill:  0     Courtny Bennison Windell Moulding, DO [place] [phone]

## 2018-02-22 ENCOUNTER — Ambulatory Visit (INDEPENDENT_AMBULATORY_CARE_PROVIDER_SITE_OTHER): Payer: No Typology Code available for payment source | Admitting: Orthopedic Surgery

## 2018-02-22 ENCOUNTER — Encounter (INDEPENDENT_AMBULATORY_CARE_PROVIDER_SITE_OTHER): Payer: Self-pay | Admitting: Orthopedic Surgery

## 2018-02-22 DIAGNOSIS — M25562 Pain in left knee: Secondary | ICD-10-CM

## 2018-02-22 DIAGNOSIS — G8929 Other chronic pain: Secondary | ICD-10-CM

## 2018-02-22 MED ORDER — METHYLPREDNISOLONE ACETATE 40 MG/ML IJ SUSP
40.0000 mg | INTRAMUSCULAR | Status: AC | PRN
  Administered 2018-02-22: 40 mg via INTRA_ARTICULAR

## 2018-02-22 MED ORDER — MELOXICAM 7.5 MG PO TABS: 7.5000 mg | ORAL_TABLET | Freq: Every day | ORAL | 3 refills | Status: DC

## 2018-02-22 MED ORDER — LIDOCAINE HCL 1 % IJ SOLN
5.0000 mL | INTRAMUSCULAR | Status: AC | PRN
  Administered 2018-02-22: 5 mL

## 2018-02-22 NOTE — Progress Notes (Signed)
Office Visit Note   Patient: Kristina Mclaughlin           Date of Birth: 11/02/1950           MRN: [id number] Visit Date: 02/22/2018              Requested by: Janora Norlander, DO [place], [place] [address] PCP: Jani Gravel, MD  Chief Complaint  Patient presents with  . Left Knee - Pain      HPI: Is a 68 year old woman who presents with left knee pain worse laterally and in the popliteal fossa.  Patient states that the pain hurts her worse at night she has been using meloxicam 7.5 mg nightly for pain.  Patient states that her knee gives out on her pain radiates behind the knee and down her leg she states the pain level varies in its getting worse.  Patient is active in the gym with exercise.  Assessment & Plan: Visit Diagnoses:  1. Chronic pain of left knee     Plan: The left knee was injected from the anterior medial portal follow-up in 4 weeks to reevaluate may require an additional steroid injection may require hyaluronic acid injection or possible MRI scan.  Follow-Up Instructions: Return in about 1 month (around 03/22/2018).   Ortho Exam  Patient is alert, oriented, no adenopathy, well-dressed, normal affect, normal respiratory effort. Examination patient has an antalgic gait there is a valgus alignment to her left knee collaterals and cruciates are stable she has a mild effusion she is tender to palpation over the lateral joint line.  Review of her previous radiographs that were obtained last month shows no specific joint space narrowing.  No subcondylar cysts or sclerosis.  Imaging: No results found. No images are attached to the encounter.  Labs: No results found for: HGBA1C, ESRSEDRATE, CRP, LABURIC, REPTSTATUS, GRAMSTAIN, CULT, LABORGA   No results found for: ALBUMIN, PREALBUMIN, LABURIC  There is no height or weight on file to calculate BMI.  Orders:  No orders of the defined types were placed in this encounter.  Meds ordered this encounter    Medications  . meloxicam (MOBIC) 7.5 MG tablet    Sig: Take 1 tablet (7.5 mg total) by mouth daily.    Dispense:  30 tablet    Refill:  3     Procedures: Large Joint Inj: L knee on 02/22/2018 3:26 PM Indications: pain and diagnostic evaluation Details: 22 G 1.5 in needle, anteromedial approach  Arthrogram: No  Medications: 5 mL lidocaine 1 %; 40 mg methylPREDNISolone acetate 40 MG/ML Outcome: tolerated well, no immediate complications Procedure, treatment alternatives, risks and benefits explained, specific risks discussed. Consent was given by the patient. Immediately prior to procedure a time out was called to verify the correct patient, procedure, equipment, support staff and site/side marked as required. Patient was prepped and draped in the usual sterile fashion.      Clinical Data: No additional findings.  ROS:  All other systems negative, except as noted in the HPI. Review of Systems  Objective: Vital Signs: There were no vitals taken for this visit.  Specialty Comments:  No specialty comments available.  PMFS History: Patient Active Problem List   Diagnosis Date Noted  . Hypertension 01/22/2018  . Hemorrhoids 01/22/2018  . Allergic rhinitis 10/29/2015  . Essential hypertension 10/29/2015  . Upper airway cough syndrome 10/20/2014   Past Medical History:  Diagnosis Date  . Hypertension     Family History  Problem Relation Age of Onset  . Bladder Cancer Mother   . Allergies Mother   . Allergies Sister     Past Surgical History:  Procedure Laterality Date  . ABDOMINAL HYSTERECTOMY    . BACK SURGERY    . BREAST SURGERY     biopsy x2; reduction  . EXAMINATION UNDER ANESTHESIA  08/27/2012  . REDUCTION MAMMAPLASTY Bilateral 2002   Social History   Occupational History  . Occupation: Geologist, engineering   Tobacco Use  . Smoking status: Never Smoker  . Smokeless tobacco: Never Used  Substance and Sexual Activity  . Alcohol use: No    Alcohol/week: 0.0 oz   . Drug use: No  . Sexual activity: Not on file

## 2018-02-23 ENCOUNTER — Telehealth (INDEPENDENT_AMBULATORY_CARE_PROVIDER_SITE_OTHER): Payer: Self-pay | Admitting: Orthopedic Surgery

## 2018-02-23 NOTE — Telephone Encounter (Signed)
Talked with patient concerning Rx change for Meloxicam.  Advised patient to give [place] a call before she refills Rx, to make sure that the Rx can be changed form 7.5mg  to 15mg .  Patient understands.

## 2018-02-23 NOTE — Telephone Encounter (Signed)
Patient is requesting that the meloxicam rx be changed to 15 mg instead of 7.5 mg. Patients # [phone]

## 2018-02-23 NOTE — Telephone Encounter (Signed)
Ok change, that's just what was in the rx module

## 2018-02-23 NOTE — Telephone Encounter (Signed)
Is this okay for patient? Please advise.  Thank you.

## 2018-03-11 ENCOUNTER — Telehealth (INDEPENDENT_AMBULATORY_CARE_PROVIDER_SITE_OTHER): Payer: Self-pay | Admitting: Orthopedic Surgery

## 2018-03-11 NOTE — Telephone Encounter (Signed)
Patient called to let Dr. Sharol Given know that she has finished the Meloxicam and needs a refill called into [facility].  CB#[phone].  Thank you

## 2018-03-12 ENCOUNTER — Other Ambulatory Visit (INDEPENDENT_AMBULATORY_CARE_PROVIDER_SITE_OTHER): Payer: Self-pay

## 2018-03-12 MED ORDER — MELOXICAM 7.5 MG PO TABS: 7.5000 mg | ORAL_TABLET | Freq: Every day | ORAL | 3 refills | Status: DC

## 2018-03-12 NOTE — Telephone Encounter (Signed)
Patient said prescription was called in again for the wrong dosage (7.5) she is wondering if a 15 mg dose can be called in after she finishes this rx in 2 weeks. She has to double up on the 7.5 mg for it to work. Patients # [phone]

## 2018-03-12 NOTE — Telephone Encounter (Signed)
Ok per Dr. Sharol Given refill rx.

## 2018-03-15 NOTE — Telephone Encounter (Signed)
Fax received from pharm this was corrected and faxed back.

## 2018-03-16 NOTE — Telephone Encounter (Signed)
Can you send to number below? I just gave this to you this morning. I am sorry.Marland KitchenMarland Kitchen

## 2018-03-16 NOTE — Telephone Encounter (Signed)
Faxed at 9:54am and I just refaxed [phone]

## 2018-03-16 NOTE — Telephone Encounter (Signed)
Stew at [facility] states he has not received updated fax in order to dispense 15 mg meloxicam for patient. He is requesting another fax be sent to fax # [phone] or call him back for verbal # [phone]

## 2018-03-25 ENCOUNTER — Ambulatory Visit (INDEPENDENT_AMBULATORY_CARE_PROVIDER_SITE_OTHER): Payer: No Typology Code available for payment source | Admitting: Orthopedic Surgery

## 2018-04-06 ENCOUNTER — Ambulatory Visit (INDEPENDENT_AMBULATORY_CARE_PROVIDER_SITE_OTHER): Payer: Medicare HMO | Admitting: Physician Assistant

## 2018-04-06 ENCOUNTER — Other Ambulatory Visit (INDEPENDENT_AMBULATORY_CARE_PROVIDER_SITE_OTHER): Payer: Self-pay

## 2018-04-06 ENCOUNTER — Encounter (INDEPENDENT_AMBULATORY_CARE_PROVIDER_SITE_OTHER): Payer: Self-pay | Admitting: Physician Assistant

## 2018-04-06 DIAGNOSIS — J3089 Other allergic rhinitis: Secondary | ICD-10-CM | POA: Diagnosis not present

## 2018-04-06 DIAGNOSIS — M25562 Pain in left knee: Secondary | ICD-10-CM | POA: Diagnosis not present

## 2018-04-06 DIAGNOSIS — G8929 Other chronic pain: Secondary | ICD-10-CM

## 2018-04-06 DIAGNOSIS — J301 Allergic rhinitis due to pollen: Secondary | ICD-10-CM | POA: Diagnosis not present

## 2018-04-06 DIAGNOSIS — J3081 Allergic rhinitis due to animal (cat) (dog) hair and dander: Secondary | ICD-10-CM | POA: Diagnosis not present

## 2018-04-06 NOTE — Progress Notes (Signed)
Office Visit Note   Patient: Kristina Mclaughlin           Date of Birth: 1950-09-23           MRN: [id number] Visit Date: 04/06/2018              Requested by: Jani Gravel, MD [address] [address] [place], [place] [address] PCP: Jani Gravel, MD   Assessment & Plan: Visit Diagnoses:  1. Chronic pain of left knee     Plan: Patient would like to proceed with MRI scan and will work on approval for left knee MRI rule out meniscal injury.  She is going to continue on her meloxicam 15 mg daily and a prescription will be sent to her pharmacy for refill.  She will follow-up following the MRI scan.  Follow-Up Instructions: Return in about 2 weeks (around 04/20/2018) for follow up after MRI of keft knee.   Orders:  No orders of the defined types were placed in this encounter.  No orders of the defined types were placed in this encounter.     Procedures: No procedures performed   Clinical Data: No additional findings.   Subjective: Chief Complaint  Patient presents with  . Left Knee - Follow-up    HPI Patient is a 67 year old female who underwent a left knee steroid injection in July.  She reports that initially after about 2 or 3 weeks she was able to resume going to the gym almost every day and started bicycling and treadmill laying but then her pain recurred over the past week.  Is primarily over the medial knee.  Hurts more with bending and with stairs or when walking walking longer distances on and even terrain.  She is also starting to have some pain down the posterior calf on the left and over the left thigh.  She is taking meloxicam 15 mg daily and reports that this helps some but she is feeling significantly impaired with her overall mobility and it is impairing her ability to be fully active in the gym.  She denies any locking and popping.  She states that she is hurts more with bending activities as noted previously. Review of Systems   Objective: Vital Signs: There  were no vitals taken for this visit.  Physical Exam Patient is a well-nourished well-developed female who is ambulatory without an assistive device.  She is well-groomed. Ortho Exam Examination of the left knee shows good range of motion with full extension and 110 degrees of flexion.  She has pain over the medial joint line to palpation and perhaps some mild effusion.  There is no knee instability. Specialty Comments:  No specialty comments available.  Imaging: No results found.   PMFS History: Patient Active Problem List   Diagnosis Date Noted  . Hypertension 01/22/2018  . Hemorrhoids 01/22/2018  . Allergic rhinitis 10/29/2015  . Essential hypertension 10/29/2015  . Upper airway cough syndrome 10/20/2014   Past Medical History:  Diagnosis Date  . Hypertension     Family History  Problem Relation Age of Onset  . Bladder Cancer Mother   . Allergies Mother   . Allergies Sister     Past Surgical History:  Procedure Laterality Date  . ABDOMINAL HYSTERECTOMY    . BACK SURGERY    . BREAST SURGERY     biopsy x2; reduction  . EXAMINATION UNDER ANESTHESIA  08/27/2012  . REDUCTION MAMMAPLASTY Bilateral 2002   Social History   Occupational History  . Occupation:  Purchasing   Tobacco Use  . Smoking status: Never Smoker  . Smokeless tobacco: Never Used  Substance and Sexual Activity  . Alcohol use: No    Alcohol/week: 0.0 standard drinks  . Drug use: No  . Sexual activity: Not on file

## 2018-04-07 DIAGNOSIS — R69 Illness, unspecified: Secondary | ICD-10-CM | POA: Diagnosis not present

## 2018-04-16 DIAGNOSIS — J3081 Allergic rhinitis due to animal (cat) (dog) hair and dander: Secondary | ICD-10-CM | POA: Diagnosis not present

## 2018-04-16 DIAGNOSIS — J3089 Other allergic rhinitis: Secondary | ICD-10-CM | POA: Diagnosis not present

## 2018-04-16 DIAGNOSIS — J301 Allergic rhinitis due to pollen: Secondary | ICD-10-CM | POA: Diagnosis not present

## 2018-04-17 ENCOUNTER — Ambulatory Visit
Admission: RE | Admit: 2018-04-17 | Discharge: 2018-04-17 | Disposition: A | Discharge status: Home / Self Care | Payer: Medicare HMO | Source: Ambulatory Visit | Attending: Physician Assistant | Admitting: Physician Assistant

## 2018-04-17 DIAGNOSIS — G8929 Other chronic pain: Secondary | ICD-10-CM

## 2018-04-17 DIAGNOSIS — M23351 Other meniscus derangements, posterior horn of lateral meniscus, right knee: Secondary | ICD-10-CM | POA: Diagnosis not present

## 2018-04-17 DIAGNOSIS — M25562 Pain in left knee: Principal | ICD-10-CM

## 2018-04-22 DIAGNOSIS — J3081 Allergic rhinitis due to animal (cat) (dog) hair and dander: Secondary | ICD-10-CM | POA: Diagnosis not present

## 2018-04-22 DIAGNOSIS — J301 Allergic rhinitis due to pollen: Secondary | ICD-10-CM | POA: Diagnosis not present

## 2018-04-22 DIAGNOSIS — J3089 Other allergic rhinitis: Secondary | ICD-10-CM | POA: Diagnosis not present

## 2018-05-04 ENCOUNTER — Ambulatory Visit (INDEPENDENT_AMBULATORY_CARE_PROVIDER_SITE_OTHER): Payer: Medicare HMO | Admitting: Physician Assistant

## 2018-05-04 ENCOUNTER — Encounter (INDEPENDENT_AMBULATORY_CARE_PROVIDER_SITE_OTHER): Payer: Self-pay | Admitting: Orthopedic Surgery

## 2018-05-04 VITALS — Ht 69.0 in | Wt 225.0 lb

## 2018-05-04 DIAGNOSIS — J3089 Other allergic rhinitis: Secondary | ICD-10-CM | POA: Diagnosis not present

## 2018-05-04 DIAGNOSIS — M1712 Unilateral primary osteoarthritis, left knee: Secondary | ICD-10-CM

## 2018-05-04 DIAGNOSIS — J3081 Allergic rhinitis due to animal (cat) (dog) hair and dander: Secondary | ICD-10-CM | POA: Diagnosis not present

## 2018-05-04 DIAGNOSIS — J301 Allergic rhinitis due to pollen: Secondary | ICD-10-CM | POA: Diagnosis not present

## 2018-05-04 MED ORDER — LIDOCAINE HCL 1 % IJ SOLN
5.0000 mL | INTRAMUSCULAR | Status: AC | PRN
  Administered 2018-05-04: 5 mL

## 2018-05-04 MED ORDER — METHYLPREDNISOLONE ACETATE 40 MG/ML IJ SUSP
40.0000 mg | INTRAMUSCULAR | Status: AC | PRN
  Administered 2018-05-04: 40 mg via INTRA_ARTICULAR

## 2018-05-04 NOTE — Progress Notes (Signed)
Office Visit Note   Patient: Kristina Mclaughlin           Date of Birth: 05-Nov-1950           MRN: [id number] Visit Date: 05/04/2018              Requested by: Jani Gravel, [place], [place] [address] PCP: Patient, No Pcp Per  Chief Complaint  Patient presents with  . Left Knee - Follow-up      HPI: Patient is a 67 year old female who was seen for follow-up following a left knee MRI scan.  The MRI showed medial and lateral meniscal tears, intact ligamentous structures and no acute bony findings, but tricompartmental degenerative changes.  She did have some thickening and inflammation of the quadriceps fat pad.  A benign-appearing ganglion cyst in the posterior epicondylar region was also noted.  The patient reports that she got about 2 weeks of improvement following her last steroid injection last visit.  She feels like the Mobic is helping some at nighttime but then her knee hurts again by the morning.  She hurts more with twisting and turning activities and going downstairs.  She notes problems with the treadmill except in a very flat position and problems with utilizing elliptical equipment.  She states that she feels like her knee is almost unstable sometimes and feels like it might locked up.   Assessment & Plan: Visit Diagnoses:  1. Unilateral primary osteoarthritis, left knee     Plan: She underwent a repeat steroid injection and tolerated this well to the left knee.  We did discuss that arthroscopic debridement of the knee might or might not provide relief as she does have tricompartmental degenerative changes.  She reports that her mother had to have bilateral knee replacements.  She is interested in trying hyaluronic gel injections to the knee to try to hold off having to have a knee replacement as long as she possibly can and were going to submit for insurance approval.  Follow-Up Instructions: Return in about 4 weeks (around 06/01/2018).   Ortho  Exam  Patient is alert, oriented, no adenopathy, well-dressed, normal affect, normal respiratory effort. The left knee has mild effusion.  There is tenderness along the joint line.  She is got tenderness posteriorly as well.  She is got full knee extension and 110 degrees of flexion.  There is no locking.  No instability.  Valgus alignment.  Imaging: No results found. No images are attached to the encounter.  Labs: No results found for: HGBA1C, ESRSEDRATE, CRP, LABURIC, REPTSTATUS, GRAMSTAIN, CULT, LABORGA   No results found for: ALBUMIN, PREALBUMIN, LABURIC  Body mass index is 33.23 kg/m.  Orders:  Orders Placed This Encounter  Procedures  . Large Joint Inj: L knee   No orders of the defined types were placed in this encounter.    Procedures: Large Joint Inj: L knee on 05/04/2018 1:49 PM Indications: pain and diagnostic evaluation Details: 22 G 1.5 in needle, anteromedial approach  Arthrogram: No  Medications: 5 mL lidocaine 1 %; 40 mg methylPREDNISolone acetate 40 MG/ML Outcome: tolerated well, no immediate complications Procedure, treatment alternatives, risks and benefits explained, specific risks discussed. Consent was given by the patient. Immediately prior to procedure a time out was called to verify the correct patient, procedure, equipment, support staff and site/side marked as required. Patient was prepped and draped in the usual sterile fashion.      Clinical Data: No additional findings.  ROS:  All other systems negative, except as noted in the HPI. Review of Systems  Objective: Vital Signs: Ht 5\' 9"  (1.753 m)   Wt 225 lb (102.1 kg)   BMI 33.23 kg/m   Specialty Comments:  No specialty comments available.  PMFS History: Patient Active Problem List   Diagnosis Date Noted  . Hypertension 01/22/2018  . Hemorrhoids 01/22/2018  . Allergic rhinitis 10/29/2015  . Essential hypertension 10/29/2015  . Upper airway cough syndrome 10/20/2014   Past  Medical History:  Diagnosis Date  . Hypertension     Family History  Problem Relation Age of Onset  . Bladder Cancer Mother   . Allergies Mother   . Allergies Sister     Past Surgical History:  Procedure Laterality Date  . ABDOMINAL HYSTERECTOMY    . BACK SURGERY    . BREAST SURGERY     biopsy x2; reduction  . EXAMINATION UNDER ANESTHESIA  08/27/2012  . REDUCTION MAMMAPLASTY Bilateral 2002   Social History   Occupational History  . Occupation: Geologist, engineering   Tobacco Use  . Smoking status: Never Smoker  . Smokeless tobacco: Never Used  Substance and Sexual Activity  . Alcohol use: No    Alcohol/week: 0.0 standard drinks  . Drug use: No  . Sexual activity: Not on file

## 2018-05-05 ENCOUNTER — Telehealth (INDEPENDENT_AMBULATORY_CARE_PROVIDER_SITE_OTHER): Payer: Self-pay | Admitting: Orthopedic Surgery

## 2018-05-05 NOTE — Telephone Encounter (Signed)
I called pt and advised that it is not uncommon for the symptoms to flare after an injection for a few days. She states that tylenol does help. Pt made an appt for the end of the month to come in and discuss if she should proceed with a total knee replacement sooner rather than later and she is afraid that the older that she get the harder it will be to rehab.

## 2018-05-05 NOTE — Telephone Encounter (Signed)
Patient called wanting to know if it is normal to experience some discomfort after the injection.  She stated that it bothered her more in her sleep but has eased off some with a couple of tylenol.   CB#[phone].  Thank you.

## 2018-05-08 DIAGNOSIS — R69 Illness, unspecified: Secondary | ICD-10-CM | POA: Diagnosis not present

## 2018-05-11 ENCOUNTER — Encounter: Payer: Self-pay | Admitting: Family Medicine

## 2018-05-11 ENCOUNTER — Ambulatory Visit (INDEPENDENT_AMBULATORY_CARE_PROVIDER_SITE_OTHER): Payer: Medicare HMO | Admitting: Family Medicine

## 2018-05-11 ENCOUNTER — Telehealth (INDEPENDENT_AMBULATORY_CARE_PROVIDER_SITE_OTHER): Payer: Self-pay

## 2018-05-11 VITALS — BP 160/94 | HR 76 | Temp 97.9°F | Ht 69.0 in | Wt 234.0 lb

## 2018-05-11 DIAGNOSIS — J309 Allergic rhinitis, unspecified: Secondary | ICD-10-CM

## 2018-05-11 DIAGNOSIS — I10 Essential (primary) hypertension: Secondary | ICD-10-CM

## 2018-05-11 DIAGNOSIS — R69 Illness, unspecified: Secondary | ICD-10-CM | POA: Diagnosis not present

## 2018-05-11 DIAGNOSIS — F5101 Primary insomnia: Secondary | ICD-10-CM

## 2018-05-11 DIAGNOSIS — Z7689 Persons encountering health services in other specified circumstances: Secondary | ICD-10-CM

## 2018-05-11 DIAGNOSIS — Z0000 Encounter for general adult medical examination without abnormal findings: Secondary | ICD-10-CM | POA: Diagnosis not present

## 2018-05-11 DIAGNOSIS — M1712 Unilateral primary osteoarthritis, left knee: Secondary | ICD-10-CM

## 2018-05-11 DIAGNOSIS — Z1239 Encounter for other screening for malignant neoplasm of breast: Secondary | ICD-10-CM | POA: Diagnosis not present

## 2018-05-11 NOTE — Progress Notes (Signed)
Kristina Mclaughlin is a 66 y.o. female presents to office today for annual physical exam examination.    Concerns today include: 1. Insomnia Patient with long-standing history of insomnia.  She typically has difficulty falling asleep.  She has used both Ambien and Lunesta in the past and neither medications have helped.  She actually had adverse side effects from [place].  She was started on temazepam 15 mg nightly many years ago by her PCP and states that initially it helped quite well with insomnia.  As of late, she feels like sometimes it helps and sometimes it takes her several hours to fall asleep despite taking the medication.  She is not used melatonin, Belsomra or any other sleep aids that she can remember.  She was told by her previous PCP that temazepam was not addictive and that is why she stayed on it.  2. Hypertension Patient reports long-standing history of hypertension.  She has been very controlled on her regimen of valsartan/hydrochlorothiazide 320 mg over 25 mg and Norvasc 2.5 mg daily.  She notes only recently has it started increasing.  She thinks this may be related to use of meloxicam for her knee pain.  Denies any chest pain, shortness of breath.  3.  Knee pain Patient was seen here in June for left knee pain.  She was referred to orthopedics and was seen by Dr. Sharol Given who recently obtain an MRI of the knee which demonstrated medial and lateral meniscal tears and a benign-appearing ganglion cyst of the posterior epicondyle region.  At this time, conservative nonsurgical treatment has been discussed with the patient.  She is wondering if she should go ahead and have a knee replacement, however, because she was told that ultimately she will require one.  She wants to get this done while she is still young and can recover well.  She wants to have a second opinion and is interested in seeing Dr. Maureen Ralphs.  She is currently being treated with meloxicam 15 mg daily.  She reports good control  of symptoms in the evening but this often wears off during the daytime.  She is on omeprazole for stomach acid.  Denies any history of GI bleed or ulcers.  Marital status: married, Substance use: none Last eye exam: Sees regularly.  Wears glasses Last colonoscopy: UTD, last 06/26/2014. Last mammogram: due soon.  Last 04/03/2017.  Has had biopsies in past.  Goes to [facility]. Last pap smear: >5 years ago? Sees [facility].  Hx partial hysterectomy Refills needed today: none Immunizations needed: ?PNA.  She had PNA shot w/ previous PCP at age 9.  She thinks PNA 13.  Past Medical History:  Diagnosis Date  . Hypertension    Social History   Socioeconomic History  . Marital status: Married    Spouse name: Not on file  . Number of children: Not on file  . Years of education: Not on file  . Highest education level: Not on file  Occupational History  . Occupation: Garment/textile technologist Needs  . Financial resource strain: Not on file  . Food insecurity:    Worry: Not on file    Inability: Not on file  . Transportation needs:    Medical: Not on file    Non-medical: Not on file  Tobacco Use  . Smoking status: Never Smoker  . Smokeless tobacco: Never Used  Substance and Sexual Activity  . Alcohol use: No    Alcohol/week: 0.0 standard drinks  . Drug  use: No  . Sexual activity: Not on file  Lifestyle  . Physical activity:    Days per week: Not on file    Minutes per session: Not on file  . Stress: Not on file  Relationships  . Social connections:    Talks on phone: Not on file    Gets together: Not on file    Attends religious service: Not on file    Active member of club or organization: Not on file    Attends meetings of clubs or organizations: Not on file    Relationship status: Not on file  . Intimate partner violence:    Fear of current or ex partner: Not on file    Emotionally abused: Not on file    Physically abused: Not on file    Forced sexual activity: Not on  file  Other Topics Concern  . Not on file  Social History Narrative  . Not on file   Past Surgical History:  Procedure Laterality Date  . ABDOMINAL HYSTERECTOMY    . BACK SURGERY    . BREAST SURGERY     biopsy x2; reduction  . EXAMINATION UNDER ANESTHESIA  08/27/2012  . REDUCTION MAMMAPLASTY Bilateral 2002   Family History  Problem Relation Age of Onset  . Bladder Cancer Mother   . Allergies Mother   . Heart disease Mother   . Allergies Sister   . Alzheimer's disease Father     Current Outpatient Medications:  .  amLODipine (NORVASC) 2.5 MG tablet, Take 2.5 mg by mouth daily., Disp: , Rfl:  .  BIOTIN PO, Take 1 capsule by mouth daily., Disp: , Rfl:  .  Calcium Carbonate-Vitamin D (CALCIUM + D PO), Take by mouth., Disp: , Rfl:  .  DEXILANT 60 MG capsule, Take 1 capsule by mouth daily., Disp: , Rfl: 2 .  fluticasone (FLONASE) 50 MCG/ACT nasal spray, PLACE 2 SPRAYS INTO BOTH NOSTRILS DAILY., Disp: 16 g, Rfl: 0 .  Glucosamine-Chondroit-Vit C-Mn (GLUCOSAMINE 1500 COMPLEX PO), Take 1 tablet by mouth daily., Disp: , Rfl:  .  levocetirizine (XYZAL) 5 MG tablet, TAKE 1 TABLET ONCE A DAY IN THE EVENING, Disp: , Rfl: 3 .  meloxicam (MOBIC) 7.5 MG tablet, Take 1 tablet (7.5 mg total) by mouth daily., Disp: 30 tablet, Rfl: 3 .  Multiple Vitamin (MULTI-VITAMIN PO), Take 1 tablet by mouth daily. , Disp: , Rfl:  .  Probiotic Product (PROBIOTIC DAILY PO), Take 1 tablet by mouth daily. , Disp: , Rfl:  .  temazepam (RESTORIL) 15 MG capsule, Take 15 mg by mouth at bedtime as needed. , Disp: , Rfl:  .  valsartan-hydrochlorothiazide (DIOVAN-HCT) 320-25 MG per tablet, Take 1 tablet by mouth daily. , Disp: , Rfl:  .  PROAIR HFA 108 (90 BASE) MCG/ACT inhaler, INHALE 2 PUFFS INTO THE LUNGS EVERY 6 (SIX) HOURS AS NEEDED FOR WHEEZING OR SHORTNESS OF BREATH. (Patient not taking: Reported on 05/11/2018), Disp: 8.5 Inhaler, Rfl: 2  Allergies  Allergen Reactions  . Avelox Abc [Moxifloxacin] Anaphylaxis  .  Lovenox [Enoxaparin Sodium] Anaphylaxis  . Quinolones Hives and Shortness Of Breath  . Mucinex Sinus-Max [Phenylephrine-Apap-Guaifenesin] Swelling     ROS: Review of Systems Constitutional: negative Eyes: positive for contacts/glasses Ears, nose, mouth, throat, and face: positive for rhinorrhea, morning cough Respiratory: negative Cardiovascular: negative Gastrointestinal: negative Genitourinary:negative Integument/breast: "fatty tumors" followed by Dermatology, Dr Denna Haggard Hematologic/lymphatic: negative Musculoskeletal:positive for knee pain.  Neurological: negative Behavioral/Psych: positive for sleep disturbance Endocrine: negative Allergic/Immunologic: positive for  seasonal allergies    Physical exam BP (!) 160/94   Pulse 76   Temp 97.9 F (36.6 C) (Oral)   Ht 5\' 9"  (1.753 m)   Wt 234 lb (106.1 kg)   BMI 34.56 kg/m  General appearance: alert, cooperative, appears stated age, no distress and mildly obese Head: Normocephalic, without obvious abnormality, atraumatic Eyes: negative findings: lids and lashes normal, conjunctivae and sclerae normal, corneas clear and pupils equal, round, reactive to light and accomodation Ears: normal TM's and external ear canals both ears Nose: Nares normal. Septum midline. Mucosa normal. No drainage or sinus tenderness.small hemostatic bleed along right inner nare noted. Throat: lips, mucosa, and tongue normal; teeth and gums normal Neck: no adenopathy, no carotid bruit, supple, symmetrical, trachea midline, thyroid not enlarged, symmetric, no tenderness/mass/nodules and well healed horizontal scar at the base of the neck (hx thyroid tumor resection) Back: symmetric, no curvature. ROM normal. No CVA tenderness. Lungs: clear to auscultation bilaterally Heart: regular rate and rhythm, S1, S2 normal, no murmur, click, rub or gallop Abdomen: soft, non-tender; bowel sounds normal; no masses,  no organomegaly and multiple soft tissue fatty masses at  inferior border of rib cage Extremities: extremities normal, atraumatic, no cyanosis or edema  Pulses: 2+ and symmetric Skin: Multiple seborrheic keratosis along the chest and back.  She has multiple pigmented nevi as well.  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  She has similar fatty tumors along the inferior aspect of the rib cage on abdominal palpation. Lymph nodes: Cervical, supraclavicular, and axillary nodes normal. Neurologic: Alert and oriented X 3, normal strength and tone. Normal symmetric reflexes. Normal coordination and gait  Psych: mood stable, speech normal, affect appropriate, pleasant Depression screen [facility] 2/9 05/11/2018 01/22/2018 07/15/2016  Decreased Interest 0 0 0  Down, Depressed, Hopeless 0 0 0  PHQ - 2 Score 0 0 0    Assessment/ Plan: Kristina Mclaughlin here for annual physical exam.   1. Annual physical exam Sees Dr. Denna Haggard for dermatology.  Has upcoming appointment.  Mammography has been scheduled.  She is up-to-date on colonoscopy.  Will need to discuss bone density screening at some point.  Will obtain records from previous PCP to verify vaccination status.  May need pneumococcal vaccine.  May also need hepatitis C, HIV screening.  Has history of hysterectomy.  Release of information form completed to obtain records from OB/GYN outlining gynecologic history. - MM DIGITAL SCREENING BILATERAL; Future  2. Establishing care with new doctor, encounter for  3. Primary insomnia Has refills of Restoril for now.  We discussed that this is considered a potentially addictive medication given that it is a benzodiazepine.  We discussed alternative options.  Patient is interested in pursuing other nonaddictive medications if possible.  She will start with over-the-counter melatonin.  She will follow-up with me in the next 4 to 8 weeks.  If having persistent issues with sleep, can consider switching medication to [place].  4. Essential  hypertension Not at goal.  Likely related to use of NSAID medication.  I have asked that she follow-up in 2 weeks for recheck with RN.  If persistently above goal, will consider increasing Norvasc to 5 mg daily while on oral NSAID.  5. Allergic rhinitis, unspecified seasonality, unspecified trigger Small bleed appreciated in the right inner nare.  Continue antihistamines as directed.  Follow-up PRN.  6. Screening for breast cancer - MM DIGITAL SCREENING BILATERAL; Future  7. Unilateral primary osteoarthritis, left  knee Referral to Dr. Maureen Ralphs has been placed. - Ambulatory referral to Orthopedic Surgery   Noelle Hoogland M. Lajuana Ripple, DO

## 2018-05-11 NOTE — Telephone Encounter (Signed)
Submitted VOB for Monovisc, left knee. 

## 2018-05-13 ENCOUNTER — Other Ambulatory Visit: Payer: Self-pay | Admitting: Family Medicine

## 2018-05-13 DIAGNOSIS — J3089 Other allergic rhinitis: Secondary | ICD-10-CM | POA: Diagnosis not present

## 2018-05-13 DIAGNOSIS — J3081 Allergic rhinitis due to animal (cat) (dog) hair and dander: Secondary | ICD-10-CM | POA: Diagnosis not present

## 2018-05-13 DIAGNOSIS — J301 Allergic rhinitis due to pollen: Secondary | ICD-10-CM | POA: Diagnosis not present

## 2018-05-13 DIAGNOSIS — Z1239 Encounter for other screening for malignant neoplasm of breast: Secondary | ICD-10-CM

## 2018-05-18 DIAGNOSIS — J3089 Other allergic rhinitis: Secondary | ICD-10-CM | POA: Diagnosis not present

## 2018-05-18 DIAGNOSIS — J301 Allergic rhinitis due to pollen: Secondary | ICD-10-CM | POA: Diagnosis not present

## 2018-05-18 DIAGNOSIS — J3081 Allergic rhinitis due to animal (cat) (dog) hair and dander: Secondary | ICD-10-CM | POA: Diagnosis not present

## 2018-05-21 ENCOUNTER — Telehealth (INDEPENDENT_AMBULATORY_CARE_PROVIDER_SITE_OTHER): Payer: Self-pay

## 2018-05-21 NOTE — Telephone Encounter (Signed)
PA required for Monovisc, left knee. Faxed completed PA form to [employer] at [phone]. 

## 2018-05-25 ENCOUNTER — Ambulatory Visit: Payer: Medicare HMO | Admitting: *Deleted

## 2018-05-25 VITALS — BP 149/87 | HR 70

## 2018-05-25 DIAGNOSIS — Z0130 Encounter for examination of blood pressure without abnormal findings: Secondary | ICD-10-CM

## 2018-05-25 DIAGNOSIS — D229 Melanocytic nevi, unspecified: Secondary | ICD-10-CM | POA: Diagnosis not present

## 2018-05-25 DIAGNOSIS — L821 Other seborrheic keratosis: Secondary | ICD-10-CM | POA: Diagnosis not present

## 2018-05-25 NOTE — Progress Notes (Signed)
Pt here for BP ck BP 159 93 P 74  BP 149 87 P 70

## 2018-05-27 DIAGNOSIS — S83242A Other tear of medial meniscus, current injury, left knee, initial encounter: Secondary | ICD-10-CM | POA: Diagnosis not present

## 2018-05-27 DIAGNOSIS — M25561 Pain in right knee: Secondary | ICD-10-CM | POA: Insufficient documentation

## 2018-05-27 DIAGNOSIS — J3081 Allergic rhinitis due to animal (cat) (dog) hair and dander: Secondary | ICD-10-CM | POA: Diagnosis not present

## 2018-05-27 DIAGNOSIS — J3089 Other allergic rhinitis: Secondary | ICD-10-CM | POA: Diagnosis not present

## 2018-05-27 DIAGNOSIS — J301 Allergic rhinitis due to pollen: Secondary | ICD-10-CM | POA: Diagnosis not present

## 2018-05-28 ENCOUNTER — Telehealth (INDEPENDENT_AMBULATORY_CARE_PROVIDER_SITE_OTHER): Payer: Self-pay

## 2018-05-28 NOTE — Telephone Encounter (Signed)
Patient is approved for Monovisc, left knee. [place] Patient will be responsible for 20% OOP. PA required PA Approval# [id number] Valid 06/03/2018- 09/03/2018 Co-pay $25.00  Appt. 06/03/2018

## 2018-05-31 DIAGNOSIS — J3089 Other allergic rhinitis: Secondary | ICD-10-CM | POA: Diagnosis not present

## 2018-05-31 DIAGNOSIS — J301 Allergic rhinitis due to pollen: Secondary | ICD-10-CM | POA: Diagnosis not present

## 2018-05-31 DIAGNOSIS — J3081 Allergic rhinitis due to animal (cat) (dog) hair and dander: Secondary | ICD-10-CM | POA: Diagnosis not present

## 2018-06-03 ENCOUNTER — Ambulatory Visit (INDEPENDENT_AMBULATORY_CARE_PROVIDER_SITE_OTHER): Payer: Medicare HMO | Admitting: Orthopedic Surgery

## 2018-06-04 ENCOUNTER — Ambulatory Visit
Admission: RE | Admit: 2018-06-04 | Discharge: 2018-06-04 | Disposition: A | Discharge status: Home / Self Care | Payer: Medicare HMO | Source: Ambulatory Visit | Attending: Family Medicine | Admitting: Family Medicine

## 2018-06-04 DIAGNOSIS — Z1239 Encounter for other screening for malignant neoplasm of breast: Secondary | ICD-10-CM

## 2018-06-04 DIAGNOSIS — Z1231 Encounter for screening mammogram for malignant neoplasm of breast: Secondary | ICD-10-CM | POA: Diagnosis not present

## 2018-06-08 DIAGNOSIS — J3081 Allergic rhinitis due to animal (cat) (dog) hair and dander: Secondary | ICD-10-CM | POA: Diagnosis not present

## 2018-06-08 DIAGNOSIS — J301 Allergic rhinitis due to pollen: Secondary | ICD-10-CM | POA: Diagnosis not present

## 2018-06-08 DIAGNOSIS — J3089 Other allergic rhinitis: Secondary | ICD-10-CM | POA: Diagnosis not present

## 2018-06-09 ENCOUNTER — Ambulatory Visit (INDEPENDENT_AMBULATORY_CARE_PROVIDER_SITE_OTHER): Payer: Medicare HMO | Admitting: *Deleted

## 2018-06-09 VITALS — BP 156/80 | HR 68

## 2018-06-09 DIAGNOSIS — Z0130 Encounter for examination of blood pressure without abnormal findings: Secondary | ICD-10-CM

## 2018-06-09 DIAGNOSIS — Z23 Encounter for immunization: Secondary | ICD-10-CM | POA: Diagnosis not present

## 2018-06-09 MED ORDER — DICLOFENAC SODIUM 1 % TD GEL: 2.0000 g | Freq: Four times a day (QID) | TRANSDERMAL | 3 refills | Status: DC

## 2018-06-09 MED ORDER — AMLODIPINE BESYLATE 5 MG PO TABS: 5.0000 mg | ORAL_TABLET | Freq: Every day | ORAL | 3 refills | Status: DC

## 2018-06-09 NOTE — Progress Notes (Signed)
Pt here for BP ck BP 156 94 P 72  rck BP BP 156 80 P 68  Per Dr Lajuana Ripple increase Amlodipine to 5mg  daily rck BP in 1 week RX sent into pharmacy  Pt also given Pneumovax23 vaccine Tolerated well

## 2018-06-10 ENCOUNTER — Telehealth: Payer: Self-pay | Admitting: Family Medicine

## 2018-06-10 ENCOUNTER — Other Ambulatory Visit: Payer: Self-pay

## 2018-06-10 MED ORDER — AMLODIPINE BESYLATE 5 MG PO TABS: 5.0000 mg | ORAL_TABLET | Freq: Every day | ORAL | 3 refills | Status: DC

## 2018-06-10 MED ORDER — DICLOFENAC SODIUM 1 % TD GEL: 2.0000 g | Freq: Four times a day (QID) | TRANSDERMAL | 3 refills | Status: AC

## 2018-06-10 NOTE — Progress Notes (Signed)
[facility]ent [facility]ine [facility], [facility] was on [facility] for 20 mins

## 2018-06-16 DIAGNOSIS — J3089 Other allergic rhinitis: Secondary | ICD-10-CM | POA: Diagnosis not present

## 2018-06-16 DIAGNOSIS — J3081 Allergic rhinitis due to animal (cat) (dog) hair and dander: Secondary | ICD-10-CM | POA: Diagnosis not present

## 2018-06-16 DIAGNOSIS — J301 Allergic rhinitis due to pollen: Secondary | ICD-10-CM | POA: Diagnosis not present

## 2018-06-17 ENCOUNTER — Ambulatory Visit: Payer: Medicare HMO

## 2018-06-25 DIAGNOSIS — J301 Allergic rhinitis due to pollen: Secondary | ICD-10-CM | POA: Diagnosis not present

## 2018-06-25 DIAGNOSIS — J3089 Other allergic rhinitis: Secondary | ICD-10-CM | POA: Diagnosis not present

## 2018-06-25 DIAGNOSIS — J3081 Allergic rhinitis due to animal (cat) (dog) hair and dander: Secondary | ICD-10-CM | POA: Diagnosis not present

## 2018-06-28 DIAGNOSIS — J301 Allergic rhinitis due to pollen: Secondary | ICD-10-CM | POA: Diagnosis not present

## 2018-06-28 DIAGNOSIS — J3089 Other allergic rhinitis: Secondary | ICD-10-CM | POA: Diagnosis not present

## 2018-06-28 DIAGNOSIS — J3081 Allergic rhinitis due to animal (cat) (dog) hair and dander: Secondary | ICD-10-CM | POA: Diagnosis not present

## 2018-06-29 DIAGNOSIS — G8918 Other acute postprocedural pain: Secondary | ICD-10-CM | POA: Diagnosis not present

## 2018-06-29 DIAGNOSIS — M23322 Other meniscus derangements, posterior horn of medial meniscus, left knee: Secondary | ICD-10-CM | POA: Diagnosis not present

## 2018-06-29 DIAGNOSIS — S83242A Other tear of medial meniscus, current injury, left knee, initial encounter: Secondary | ICD-10-CM | POA: Diagnosis not present

## 2018-07-02 ENCOUNTER — Other Ambulatory Visit: Payer: Self-pay | Admitting: *Deleted

## 2018-07-02 NOTE — Telephone Encounter (Signed)
Last seen 05/11/18, doesn't look like we have rx'd this before. Ok to refill?

## 2018-07-05 MED ORDER — TEMAZEPAM 15 MG PO CAPS: 15.0000 mg | ORAL_CAPSULE | Freq: Every evening | ORAL | 0 refills | Status: DC | PRN

## 2018-07-05 NOTE — Telephone Encounter (Signed)
She was supposed to see me 4-8 weeks following our establish care visit in Oct.  I will send a small quantity in but she will need OV to have refills since it is controlled.

## 2018-07-05 NOTE — Telephone Encounter (Signed)
Pt aware and f/u scheduled 07/21/18.

## 2018-07-06 DIAGNOSIS — J3089 Other allergic rhinitis: Secondary | ICD-10-CM | POA: Diagnosis not present

## 2018-07-06 DIAGNOSIS — J3081 Allergic rhinitis due to animal (cat) (dog) hair and dander: Secondary | ICD-10-CM | POA: Diagnosis not present

## 2018-07-06 DIAGNOSIS — J301 Allergic rhinitis due to pollen: Secondary | ICD-10-CM | POA: Diagnosis not present

## 2018-07-09 DIAGNOSIS — J301 Allergic rhinitis due to pollen: Secondary | ICD-10-CM | POA: Diagnosis not present

## 2018-07-09 DIAGNOSIS — J3081 Allergic rhinitis due to animal (cat) (dog) hair and dander: Secondary | ICD-10-CM | POA: Diagnosis not present

## 2018-07-12 DIAGNOSIS — J3089 Other allergic rhinitis: Secondary | ICD-10-CM | POA: Diagnosis not present

## 2018-07-16 DIAGNOSIS — J3081 Allergic rhinitis due to animal (cat) (dog) hair and dander: Secondary | ICD-10-CM | POA: Diagnosis not present

## 2018-07-16 DIAGNOSIS — J3089 Other allergic rhinitis: Secondary | ICD-10-CM | POA: Diagnosis not present

## 2018-07-16 DIAGNOSIS — J301 Allergic rhinitis due to pollen: Secondary | ICD-10-CM | POA: Diagnosis not present

## 2018-07-19 DIAGNOSIS — H524 Presbyopia: Secondary | ICD-10-CM | POA: Diagnosis not present

## 2018-07-19 DIAGNOSIS — H2513 Age-related nuclear cataract, bilateral: Secondary | ICD-10-CM | POA: Diagnosis not present

## 2018-07-19 DIAGNOSIS — E7800 Pure hypercholesterolemia, unspecified: Secondary | ICD-10-CM | POA: Diagnosis not present

## 2018-07-19 DIAGNOSIS — I10 Essential (primary) hypertension: Secondary | ICD-10-CM | POA: Diagnosis not present

## 2018-07-21 ENCOUNTER — Encounter: Payer: Self-pay | Admitting: Family Medicine

## 2018-07-21 ENCOUNTER — Ambulatory Visit (INDEPENDENT_AMBULATORY_CARE_PROVIDER_SITE_OTHER): Payer: Medicare HMO | Admitting: Family Medicine

## 2018-07-21 VITALS — BP 127/79 | HR 78 | Temp 98.2°F | Ht 69.0 in | Wt 238.0 lb

## 2018-07-21 DIAGNOSIS — I10 Essential (primary) hypertension: Secondary | ICD-10-CM

## 2018-07-21 DIAGNOSIS — F5101 Primary insomnia: Secondary | ICD-10-CM | POA: Diagnosis not present

## 2018-07-21 DIAGNOSIS — R69 Illness, unspecified: Secondary | ICD-10-CM | POA: Diagnosis not present

## 2018-07-21 MED ORDER — VALSARTAN-HYDROCHLOROTHIAZIDE 320-25 MG PO TABS: 1.0000 | ORAL_TABLET | Freq: Every day | ORAL | 3 refills | Status: DC

## 2018-07-21 MED ORDER — TEMAZEPAM 15 MG PO CAPS: ORAL_CAPSULE | ORAL | 0 refills | Status: DC

## 2018-07-21 NOTE — Progress Notes (Signed)
Subjective: CC: HTN follow up PCP: Janora Norlander, DO JEH:[id number] Kristina Mclaughlin is a 67 y.o. female presenting to clinic today for:  1. HTN Patient reports compliance with Norvasc 5 mg and Diovan 320-25.  No chest pain, shortness of breath, dizziness.  Blood pressure has been nicely controlled in the 120s/70-80s.  She denies any lower extremity edema.  She does have mild swelling in the knee but notes that she just had surgery on that knee.  She is getting around better after surgery.  She has follow-up with her orthopedist in January.  2.  Sleep disorder Patient reports longstanding history of sleep disorder.  She has had difficulty falling asleep but generally is able to stay asleep without any problems.  Denies any anxiety or depressive components.  She typically allows quiet time with reading prior to bed.  She has been using temazepam for some time now but is interested in coming off of it because of its addictive nature.  Additionally, she does not find it as effective as it was when she first started it.  She has been on Lunesta in the past which gave her a bad taste in her mouth.  She also was on Ambien in the past but this was discontinued by her previous doctors because there was concern for ongoing use of Ambien.  She is tried melatonin along with the temazepam and has found that it has helped with sleep some.  She has not tried the melatonin away from temazepam to see if this would alone help her sleep.  She currently is taking 5 mg.  She reports about every day use of temazepam currently.  No falls.  No problems with breathing.   ROS: Per HPI  Allergies  Allergen Reactions  . Avelox Abc [Moxifloxacin] Anaphylaxis  . Lovenox [Enoxaparin Sodium] Anaphylaxis  . Quinolones Hives and Shortness Of Breath  . Mucinex Sinus-Max [Phenylephrine-Apap-Guaifenesin] Swelling   Past Medical History:  Diagnosis Date  . Essential hypertension 10/29/2015  . Hypertension     Current Outpatient  Medications:  .  amLODipine (NORVASC) 5 MG tablet, Take 1 tablet (5 mg total) by mouth daily., Disp: 90 tablet, Rfl: 3 .  BIOTIN PO, Take 1 capsule by mouth daily., Disp: , Rfl:  .  Calcium Carbonate-Vitamin D (CALCIUM + D PO), Take by mouth., Disp: , Rfl:  .  DEXILANT 60 MG capsule, Take 1 capsule by mouth daily., Disp: , Rfl: 2 .  fluticasone (FLONASE) 50 MCG/ACT nasal spray, PLACE 2 SPRAYS INTO BOTH NOSTRILS DAILY., Disp: 16 g, Rfl: 0 .  Glucosamine-Chondroit-Vit C-Mn (GLUCOSAMINE 1500 COMPLEX PO), Take 1 tablet by mouth daily., Disp: , Rfl:  .  levocetirizine (XYZAL) 5 MG tablet, TAKE 1 TABLET ONCE A DAY IN THE EVENING, Disp: , Rfl: 3 .  meloxicam (MOBIC) 7.5 MG tablet, Take 1 tablet (7.5 mg total) by mouth daily., Disp: 30 tablet, Rfl: 3 .  Multiple Vitamin (MULTI-VITAMIN PO), Take 1 tablet by mouth daily. , Disp: , Rfl:  .  PROAIR HFA 108 (90 BASE) MCG/ACT inhaler, INHALE 2 PUFFS INTO THE LUNGS EVERY 6 (SIX) HOURS AS NEEDED FOR WHEEZING OR SHORTNESS OF BREATH. (Patient not taking: Reported on 05/11/2018), Disp: 8.5 Inhaler, Rfl: 2 .  Probiotic Product (PROBIOTIC DAILY PO), Take 1 tablet by mouth daily. , Disp: , Rfl:  .  temazepam (RESTORIL) 15 MG capsule, Take 1 capsule (15 mg total) by mouth at bedtime as needed. Needs OV, Disp: 30 capsule, Rfl: 0 .  valsartan-hydrochlorothiazide (DIOVAN-HCT) 320-25 MG per tablet, Take 1 tablet by mouth daily. , Disp: , Rfl:  Social History   Socioeconomic History  . Marital status: Married    Spouse name: Not on file  . Number of children: Not on file  . Years of education: Not on file  . Highest education level: Not on file  Occupational History  . Occupation: Garment/textile technologist Needs  . Financial resource strain: Not on file  . Food insecurity:    Worry: Not on file    Inability: Not on file  . Transportation needs:    Medical: Not on file    Non-medical: Not on file  Tobacco Use  . Smoking status: Never Smoker  . Smokeless tobacco: Never  Used  Substance and Sexual Activity  . Alcohol use: No    Alcohol/week: 0.0 standard drinks  . Drug use: No  . Sexual activity: Not on file  Lifestyle  . Physical activity:    Days per week: Not on file    Minutes per session: Not on file  . Stress: Not on file  Relationships  . Social connections:    Talks on phone: Not on file    Gets together: Not on file    Attends religious service: Not on file    Active member of club or organization: Not on file    Attends meetings of clubs or organizations: Not on file    Relationship status: Not on file  . Intimate partner violence:    Fear of current or ex partner: Not on file    Emotionally abused: Not on file    Physically abused: Not on file    Forced sexual activity: Not on file  Other Topics Concern  . Not on file  Social History Narrative  . Not on file   Family History  Problem Relation Age of Onset  . Bladder Cancer Mother   . Allergies Mother   . Heart disease Mother   . Allergies Sister   . Alzheimer's disease Father   . Breast cancer Neg Hx     Objective: Office vital signs reviewed. BP 127/79   Pulse 78   Temp 98.2 F (36.8 C) (Oral)   Ht 5\' 9"  (1.753 m)   Wt 238 lb (108 kg)   BMI 35.15 kg/m   Physical Examination:  General: Awake, alert, well nourished, No acute distress Cardio: regular rate and rhythm, S1S2 heard, no murmurs appreciated Pulm: clear to auscultation bilaterally, no wheezes, rhonchi or rales; normal work of breathing on room air Psych: Mood stable, speech normal, affect appropriate, pleasant and interactive. Depression screen [facility] 2/9 07/21/2018 05/11/2018 01/22/2018  Decreased Interest 0 0 0  Down, Depressed, Hopeless 0 0 0  PHQ - 2 Score 0 0 0  Altered sleeping 0 - -  Tired, decreased energy 0 - -  Change in appetite 0 - -  Feeling bad or failure about yourself  0 - -  Trouble concentrating 0 - -  Moving slowly or fidgety/restless 0 - -  Suicidal thoughts 0 - -  PHQ-9 Score 0 - -    Difficult doing work/chores Not difficult at all - -    Assessment/ Plan: 67 y.o. female   1. Essential hypertension Under excellent control with triple therapy.  Continue increased dose of Norvasc.  Refills have been sent on her Diovan/HCTZ.   2. Primary insomnia We discussed spacing temazepam out to every other night at bedtime.  She will continue  to take melatonin 5 to 10 mg nightly for sleep.  We will plan for wean off of temazepam.  There is no underlying anxiety disorder so I think that we should try an alternative therapy.  We discussed use of Rozerem versus melatonin versus Ambien versus Belsomra.  She will follow-up in the next month or 2 for recheck. The Narcotic Database has been reviewed.  Last fill Temazepam 07/05/18. There were no red flags.    No orders of the defined types were placed in this encounter.  Meds ordered this encounter  Medications  . temazepam (RESTORIL) 15 MG capsule    Sig: Take 1 capsule every other night at bedtime as needed for sleep.    Dispense:  30 capsule    Refill:  0    May fill 30 days after last fill.  Please put on file for now.  . valsartan-hydrochlorothiazide (DIOVAN-HCT) 320-25 MG tablet    Sig: Take 1 tablet by mouth daily.    Dispense:  90 tablet    Refill:  [place], [place] [phone]

## 2018-07-22 DIAGNOSIS — J301 Allergic rhinitis due to pollen: Secondary | ICD-10-CM | POA: Diagnosis not present

## 2018-07-22 DIAGNOSIS — J3081 Allergic rhinitis due to animal (cat) (dog) hair and dander: Secondary | ICD-10-CM | POA: Diagnosis not present

## 2018-07-22 DIAGNOSIS — J3089 Other allergic rhinitis: Secondary | ICD-10-CM | POA: Diagnosis not present

## 2018-07-26 DIAGNOSIS — J3089 Other allergic rhinitis: Secondary | ICD-10-CM | POA: Diagnosis not present

## 2018-07-26 DIAGNOSIS — J301 Allergic rhinitis due to pollen: Secondary | ICD-10-CM | POA: Diagnosis not present

## 2018-07-26 DIAGNOSIS — J3081 Allergic rhinitis due to animal (cat) (dog) hair and dander: Secondary | ICD-10-CM | POA: Diagnosis not present

## 2018-08-06 DIAGNOSIS — J3081 Allergic rhinitis due to animal (cat) (dog) hair and dander: Secondary | ICD-10-CM | POA: Diagnosis not present

## 2018-08-06 DIAGNOSIS — J301 Allergic rhinitis due to pollen: Secondary | ICD-10-CM | POA: Diagnosis not present

## 2018-08-06 DIAGNOSIS — J3089 Other allergic rhinitis: Secondary | ICD-10-CM | POA: Diagnosis not present

## 2018-08-11 ENCOUNTER — Ambulatory Visit: Payer: Medicare HMO | Admitting: Family Medicine

## 2018-08-12 DIAGNOSIS — J45991 Cough variant asthma: Secondary | ICD-10-CM | POA: Diagnosis not present

## 2018-08-12 DIAGNOSIS — J3081 Allergic rhinitis due to animal (cat) (dog) hair and dander: Secondary | ICD-10-CM | POA: Diagnosis not present

## 2018-08-12 DIAGNOSIS — J301 Allergic rhinitis due to pollen: Secondary | ICD-10-CM | POA: Diagnosis not present

## 2018-08-12 DIAGNOSIS — J3089 Other allergic rhinitis: Secondary | ICD-10-CM | POA: Diagnosis not present

## 2018-08-18 DIAGNOSIS — J3081 Allergic rhinitis due to animal (cat) (dog) hair and dander: Secondary | ICD-10-CM | POA: Diagnosis not present

## 2018-08-18 DIAGNOSIS — J301 Allergic rhinitis due to pollen: Secondary | ICD-10-CM | POA: Diagnosis not present

## 2018-08-18 DIAGNOSIS — J3089 Other allergic rhinitis: Secondary | ICD-10-CM | POA: Diagnosis not present

## 2018-08-27 DIAGNOSIS — J3081 Allergic rhinitis due to animal (cat) (dog) hair and dander: Secondary | ICD-10-CM | POA: Diagnosis not present

## 2018-08-27 DIAGNOSIS — J3089 Other allergic rhinitis: Secondary | ICD-10-CM | POA: Diagnosis not present

## 2018-08-27 DIAGNOSIS — J301 Allergic rhinitis due to pollen: Secondary | ICD-10-CM | POA: Diagnosis not present

## 2018-08-31 DIAGNOSIS — J3081 Allergic rhinitis due to animal (cat) (dog) hair and dander: Secondary | ICD-10-CM | POA: Diagnosis not present

## 2018-08-31 DIAGNOSIS — J301 Allergic rhinitis due to pollen: Secondary | ICD-10-CM | POA: Diagnosis not present

## 2018-08-31 DIAGNOSIS — J3089 Other allergic rhinitis: Secondary | ICD-10-CM | POA: Diagnosis not present

## 2018-09-03 DIAGNOSIS — J3081 Allergic rhinitis due to animal (cat) (dog) hair and dander: Secondary | ICD-10-CM | POA: Diagnosis not present

## 2018-09-03 DIAGNOSIS — J3089 Other allergic rhinitis: Secondary | ICD-10-CM | POA: Diagnosis not present

## 2018-09-03 DIAGNOSIS — J301 Allergic rhinitis due to pollen: Secondary | ICD-10-CM | POA: Diagnosis not present

## 2018-09-06 DIAGNOSIS — S83249A Other tear of medial meniscus, current injury, unspecified knee, initial encounter: Secondary | ICD-10-CM

## 2018-09-06 HISTORY — DX: Other tear of medial meniscus, current injury, unspecified knee, initial encounter: S83.249A

## 2018-09-17 DIAGNOSIS — J3081 Allergic rhinitis due to animal (cat) (dog) hair and dander: Secondary | ICD-10-CM | POA: Diagnosis not present

## 2018-09-17 DIAGNOSIS — J3089 Other allergic rhinitis: Secondary | ICD-10-CM | POA: Diagnosis not present

## 2018-09-17 DIAGNOSIS — J301 Allergic rhinitis due to pollen: Secondary | ICD-10-CM | POA: Diagnosis not present

## 2018-09-21 ENCOUNTER — Ambulatory Visit (INDEPENDENT_AMBULATORY_CARE_PROVIDER_SITE_OTHER): Payer: Medicare HMO | Admitting: Family Medicine

## 2018-09-21 VITALS — BP 133/73 | HR 82 | Temp 98.1°F | Ht 69.0 in | Wt 240.0 lb

## 2018-09-21 DIAGNOSIS — F5101 Primary insomnia: Secondary | ICD-10-CM

## 2018-09-21 DIAGNOSIS — R69 Illness, unspecified: Secondary | ICD-10-CM | POA: Diagnosis not present

## 2018-09-21 MED ORDER — SUVOREXANT 10 MG PO TABS: 10.0000 mg | ORAL_TABLET | Freq: Every day | ORAL | 0 refills | Status: DC

## 2018-09-21 MED ORDER — SUVOREXANT 20 MG PO TABS: 20.0000 mg | ORAL_TABLET | Freq: Every day | ORAL | 0 refills | Status: DC

## 2018-09-21 MED ORDER — SUVOREXANT 15 MG PO TABS: 15.0000 mg | ORAL_TABLET | Freq: Every day | ORAL | 0 refills | Status: DC

## 2018-09-21 NOTE — Patient Instructions (Signed)
I have prescribed you Belsomra, which is a sleep agent.  I have given you 3 different doses.  Please start with the 10 mg every night at bedtime.  Use this for 1 week before transitioning to the 15 mg.  If after 1 week, the 15 mg is not effective, you may increase to 20 mg.  Plan to see me back in the next month for recheck.  Additionally, we discussed tapering off of the temazepam.  I would like you to take it every other night at bedtime alternating with the Belsomra.  Do not take both of these medications at the same time.

## 2018-09-21 NOTE — Progress Notes (Signed)
Subjective: CC: insomnia follow up PCP: Janora Norlander, DO HXT:[id number] Kristina Mclaughlin is a 68 y.o. female presenting to clinic today for:  1.  Sleep disorder Patient was last seen in December for sleep disorder.  At that time, we discussed tapering off of temazepam which she had been on for quite some time and felt that it was not as effective as when she initiated the medicine.  She was worried about the addictive potential.  We discussed transitioning over to melatonin and she notes having maxed out the OTC dose of melatonin with little improvement in sleep.  She is resumed daily use of temazepam 50 mg.  Again, she has had intolerance/ineffectiveness from [place].  Previously treated with Ambien and this was helpful but she was transitioned over to temazepam because she had been on Ambien for so long.  ROS: Per HPI  Allergies  Allergen Reactions  . Avelox Abc [Moxifloxacin] Anaphylaxis  . Lovenox [Enoxaparin Sodium] Anaphylaxis  . Quinolones Hives and Shortness Of Breath  . Mucinex Sinus-Max [Phenylephrine-Apap-Guaifenesin] Swelling   Past Medical History:  Diagnosis Date  . Essential hypertension 10/29/2015  . Hypertension     Current Outpatient Medications:  .  amLODipine (NORVASC) 5 MG tablet, Take 1 tablet (5 mg total) by mouth daily., Disp: 90 tablet, Rfl: 3 .  BIOTIN PO, Take 1 capsule by mouth daily., Disp: , Rfl:  .  Calcium Carbonate-Vitamin D (CALCIUM + D PO), Take by mouth., Disp: , Rfl:  .  DEXILANT 60 MG capsule, Take 1 capsule by mouth daily., Disp: , Rfl: 2 .  fluticasone (FLONASE) 50 MCG/ACT nasal spray, PLACE 2 SPRAYS INTO BOTH NOSTRILS DAILY., Disp: 16 g, Rfl: 0 .  Glucosamine-Chondroit-Vit C-Mn (GLUCOSAMINE 1500 COMPLEX PO), Take 1 tablet by mouth daily., Disp: , Rfl:  .  levocetirizine (XYZAL) 5 MG tablet, TAKE 1 TABLET ONCE A DAY IN THE EVENING, Disp: , Rfl: 3 .  Multiple Vitamin (MULTI-VITAMIN PO), Take 1 tablet by mouth daily. , Disp: , Rfl:  .  PROAIR HFA  108 (90 BASE) MCG/ACT inhaler, INHALE 2 PUFFS INTO THE LUNGS EVERY 6 (SIX) HOURS AS NEEDED FOR WHEEZING OR SHORTNESS OF BREATH., Disp: 8.5 Inhaler, Rfl: 2 .  Probiotic Product (PROBIOTIC DAILY PO), Take 1 tablet by mouth daily. , Disp: , Rfl:  .  temazepam (RESTORIL) 15 MG capsule, Take 1 capsule every other night at bedtime as needed for sleep., Disp: 30 capsule, Rfl: 0 .  valsartan-hydrochlorothiazide (DIOVAN-HCT) 320-25 MG tablet, Take 1 tablet by mouth daily., Disp: 90 tablet, Rfl: 3 Social History   Socioeconomic History  . Marital status: Married    Spouse name: Not on file  . Number of children: Not on file  . Years of education: Not on file  . Highest education level: Not on file  Occupational History  . Occupation: Garment/textile technologist Needs  . Financial resource strain: Not on file  . Food insecurity:    Worry: Not on file    Inability: Not on file  . Transportation needs:    Medical: Not on file    Non-medical: Not on file  Tobacco Use  . Smoking status: Never Smoker  . Smokeless tobacco: Never Used  Substance and Sexual Activity  . Alcohol use: No    Alcohol/week: 0.0 standard drinks  . Drug use: No  . Sexual activity: Not on file  Lifestyle  . Physical activity:    Days per week: Not on file    Minutes  per session: Not on file  . Stress: Not on file  Relationships  . Social connections:    Talks on phone: Not on file    Gets together: Not on file    Attends religious service: Not on file    Active member of club or organization: Not on file    Attends meetings of clubs or organizations: Not on file    Relationship status: Not on file  . Intimate partner violence:    Fear of current or ex partner: Not on file    Emotionally abused: Not on file    Physically abused: Not on file    Forced sexual activity: Not on file  Other Topics Concern  . Not on file  Social History Narrative  . Not on file   Family History  Problem Relation Age of Onset  . Bladder  Cancer Mother   . Allergies Mother   . Heart disease Mother   . Allergies Sister   . Alzheimer's disease Father   . Breast cancer Neg Hx     Objective: Office vital signs reviewed. BP 133/73   Pulse 82   Temp 98.1 F (36.7 C) (Oral)   Ht 5\' 9"  (1.753 m)   Wt 240 lb (108.9 kg)   BMI 35.44 kg/m   Physical Examination:  General: Awake, alert, well nourished, No acute distress Cardio: regular rate Pulm: normal work of breathing on room air Psych: Mood stable, speech normal, affect appropriate, pleasant and interactive. Depression screen [facility] 2/9 09/21/2018 07/21/2018 05/11/2018  Decreased Interest 0 0 0  Down, Depressed, Hopeless 0 0 0  PHQ - 2 Score 0 0 0  Altered sleeping 0 0 -  Tired, decreased energy 0 0 -  Change in appetite 0 0 -  Feeling bad or failure about yourself  0 0 -  Trouble concentrating 0 0 -  Moving slowly or fidgety/restless 0 0 -  Suicidal thoughts 0 0 -  PHQ-9 Score 0 0 -  Difficult doing work/chores - Not difficult at all -   No flowsheet data found.  Assessment/ Plan: 68 y.o. female   1. Primary insomnia Refractory to melatonin.  Has failed Lunesta in the past.  May be a candidate to get back onto Ambien instead of temazepam.  We discussed consideration for Belsomra and I have given her 3 separate prescriptions for 3 separate trial doses.  Instructions for taper up discussed.  If her insurance is not can allow her to take this medicine, may need to consider retrial of Ambien.  We discussed taper off of temazepam and to never use temazepam along with the Belsomra.  She was good understanding.  Written instruction provided and reviewed with patient.  She will follow-up in 1 month, sooner if needed.   No orders of the defined types were placed in this encounter.  Meds ordered this encounter  Medications  . Suvorexant (BELSOMRA) 10 MG TABS    Sig: Take 10 mg by mouth at bedtime.    Dispense:  10 tablet    Refill:  0  . Suvorexant (BELSOMRA) 15 MG  TABS    Sig: Take 15 mg by mouth at bedtime.    Dispense:  10 tablet    Refill:  0  . Suvorexant (BELSOMRA) 20 MG TABS    Sig: Take 20 mg by mouth at bedtime.    Dispense:  10 tablet    Refill:  0   The Narcotic Database has been reviewed.  There were  no red flags.  Last fill temazepam 08/27/2018  Janora Norlander, DO [place] [phone]

## 2018-10-01 DIAGNOSIS — J301 Allergic rhinitis due to pollen: Secondary | ICD-10-CM | POA: Diagnosis not present

## 2018-10-01 DIAGNOSIS — J3089 Other allergic rhinitis: Secondary | ICD-10-CM | POA: Diagnosis not present

## 2018-10-01 DIAGNOSIS — J3081 Allergic rhinitis due to animal (cat) (dog) hair and dander: Secondary | ICD-10-CM | POA: Diagnosis not present

## 2018-10-05 ENCOUNTER — Other Ambulatory Visit: Payer: Self-pay | Admitting: Family Medicine

## 2018-10-06 ENCOUNTER — Other Ambulatory Visit: Payer: Self-pay | Admitting: *Deleted

## 2018-10-06 MED ORDER — OMEPRAZOLE 40 MG PO CPDR: 40.0000 mg | DELAYED_RELEASE_CAPSULE | Freq: Every day | ORAL | 3 refills | Status: DC

## 2018-10-07 ENCOUNTER — Other Ambulatory Visit: Payer: Self-pay | Admitting: Family Medicine

## 2018-10-15 ENCOUNTER — Other Ambulatory Visit: Payer: Self-pay | Admitting: Family Medicine

## 2018-10-15 DIAGNOSIS — J301 Allergic rhinitis due to pollen: Secondary | ICD-10-CM | POA: Diagnosis not present

## 2018-10-15 DIAGNOSIS — J3089 Other allergic rhinitis: Secondary | ICD-10-CM | POA: Diagnosis not present

## 2018-10-15 DIAGNOSIS — J3081 Allergic rhinitis due to animal (cat) (dog) hair and dander: Secondary | ICD-10-CM | POA: Diagnosis not present

## 2018-10-15 NOTE — Telephone Encounter (Signed)
Last seen 09/21/2018

## 2018-10-20 ENCOUNTER — Other Ambulatory Visit: Payer: Self-pay

## 2018-10-20 ENCOUNTER — Ambulatory Visit (INDEPENDENT_AMBULATORY_CARE_PROVIDER_SITE_OTHER): Payer: Medicare HMO | Admitting: Family Medicine

## 2018-10-20 ENCOUNTER — Encounter: Payer: Self-pay | Admitting: Family Medicine

## 2018-10-20 VITALS — BP 146/79 | HR 76 | Temp 97.5°F | Ht 69.0 in | Wt 241.0 lb

## 2018-10-20 DIAGNOSIS — F5101 Primary insomnia: Secondary | ICD-10-CM

## 2018-10-20 DIAGNOSIS — Z130 Encounter for screening for diseases of the blood and blood-forming organs and certain disorders involving the immune mechanism: Secondary | ICD-10-CM | POA: Diagnosis not present

## 2018-10-20 DIAGNOSIS — Z1159 Encounter for screening for other viral diseases: Secondary | ICD-10-CM

## 2018-10-20 DIAGNOSIS — R058 Other specified cough: Secondary | ICD-10-CM

## 2018-10-20 DIAGNOSIS — I10 Essential (primary) hypertension: Secondary | ICD-10-CM | POA: Diagnosis not present

## 2018-10-20 DIAGNOSIS — R69 Illness, unspecified: Secondary | ICD-10-CM | POA: Diagnosis not present

## 2018-10-20 DIAGNOSIS — R05 Cough: Secondary | ICD-10-CM | POA: Diagnosis not present

## 2018-10-20 MED ORDER — ALBUTEROL SULFATE HFA 108 (90 BASE) MCG/ACT IN AERS: INHALATION_SPRAY | RESPIRATORY_TRACT | 2 refills | Status: DC

## 2018-10-20 MED ORDER — BENZONATATE 100 MG PO CAPS: 100.0000 mg | ORAL_CAPSULE | Freq: Three times a day (TID) | ORAL | 0 refills | Status: DC | PRN

## 2018-10-20 NOTE — Progress Notes (Signed)
Subjective: CC: insomnia follow up PCP: Janora Norlander, DO JYN:[id number] Kristina Mclaughlin is a 68 y.o. female presenting to clinic today for:  1.  Sleep disorder History: Had been treated with Ambien for many years and then ultimately transitioned to temazepam.  She tried to transition to melatonin but this did not improve sleep.  Lunesta that was ineffective.  Patient was last seen in February for sleep disorder.  We started a trial of Belsomra in efforts to taper off of benzodiazepine.  She is currently taking the 15 mg and states that she is only taken 1 tablet thus far.  She did have a good night sleep after that tablet however.  She is alternating with the temazepam and thinks that this dose is probably going to work for her but it is too soon to tell.  She denies any excessive daytime sleepiness following the medication.  No falls.  No respiratory depression.  2.  Cough Patient with known history of asthma.  She notes that after she went to a friend's house who had many cats and was a smoker she started having increased coughing and wheezing.  She has been using her albuterol inhaler in efforts to improve symptoms and this does help but she still has a productive cough at nighttime.  She is not using any over-the-counter products including Mucinex.  She does report compliance with Xyzal and Flonase.  Denies any hemoptysis but does note some bloody drainage from the nose.  No fevers.  No shortness of breath.  3.  Hypertension Patient compliant with home medications.  No chest pain.  No shortness of breath.  ROS: Per HPI  Allergies  Allergen Reactions  . Avelox Abc [Moxifloxacin] Anaphylaxis  . Lovenox [Enoxaparin Sodium] Anaphylaxis  . Quinolones Hives and Shortness Of Breath  . Mucinex Sinus-Max [Phenylephrine-Apap-Guaifenesin] Swelling   Past Medical History:  Diagnosis Date  . Essential hypertension 10/29/2015  . Hypertension     Current Outpatient Medications:  .  amLODipine  (NORVASC) 5 MG tablet, Take 1 tablet (5 mg total) by mouth daily., Disp: 90 tablet, Rfl: 3 .  BIOTIN PO, Take 1 capsule by mouth daily., Disp: , Rfl:  .  Calcium Carbonate-Vitamin D (CALCIUM + D PO), Take by mouth., Disp: , Rfl:  .  DEXILANT 60 MG capsule, Take 1 capsule by mouth daily., Disp: , Rfl: 2 .  fluticasone (FLONASE) 50 MCG/ACT nasal spray, PLACE 2 SPRAYS INTO BOTH NOSTRILS DAILY., Disp: 16 g, Rfl: 0 .  Glucosamine-Chondroit-Vit C-Mn (GLUCOSAMINE 1500 COMPLEX PO), Take 1 tablet by mouth daily., Disp: , Rfl:  .  levocetirizine (XYZAL) 5 MG tablet, TAKE 1 TABLET ONCE A DAY IN THE EVENING, Disp: , Rfl: 3 .  Multiple Vitamin (MULTI-VITAMIN PO), Take 1 tablet by mouth daily. , Disp: , Rfl:  .  omeprazole (PRILOSEC) 40 MG capsule, Take 1 capsule (40 mg total) by mouth daily., Disp: 90 capsule, Rfl: 3 .  PROAIR HFA 108 (90 BASE) MCG/ACT inhaler, INHALE 2 PUFFS INTO THE LUNGS EVERY 6 (SIX) HOURS AS NEEDED FOR WHEEZING OR SHORTNESS OF BREATH., Disp: 8.5 Inhaler, Rfl: 2 .  Probiotic Product (PROBIOTIC DAILY PO), Take 1 tablet by mouth daily. , Disp: , Rfl:  .  Suvorexant (BELSOMRA) 10 MG TABS, Take 10 mg by mouth at bedtime., Disp: 10 tablet, Rfl: 0 .  Suvorexant (BELSOMRA) 15 MG TABS, Take 15 mg by mouth at bedtime., Disp: 10 tablet, Rfl: 0 .  Suvorexant (BELSOMRA) 20 MG TABS, Take 20  mg by mouth at bedtime., Disp: 10 tablet, Rfl: 0 .  temazepam (RESTORIL) 15 MG capsule, TAKE 1 CAPSULE EVERY OTHER NIGHT AT BEDTIME AS NEEDED FOR SLEEP., Disp: 30 capsule, Rfl: 2 .  valsartan-hydrochlorothiazide (DIOVAN-HCT) 320-25 MG tablet, Take 1 tablet by mouth daily., Disp: 90 tablet, Rfl: 3 Social History   Socioeconomic History  . Marital status: Married    Spouse name: Not on file  . Number of children: Not on file  . Years of education: Not on file  . Highest education level: Not on file  Occupational History  . Occupation: Garment/textile technologist Needs  . Financial resource strain: Not on file  . Food  insecurity:    Worry: Not on file    Inability: Not on file  . Transportation needs:    Medical: Not on file    Non-medical: Not on file  Tobacco Use  . Smoking status: Never Smoker  . Smokeless tobacco: Never Used  Substance and Sexual Activity  . Alcohol use: No    Alcohol/week: 0.0 standard drinks  . Drug use: No  . Sexual activity: Not on file  Lifestyle  . Physical activity:    Days per week: Not on file    Minutes per session: Not on file  . Stress: Not on file  Relationships  . Social connections:    Talks on phone: Not on file    Gets together: Not on file    Attends religious service: Not on file    Active member of club or organization: Not on file    Attends meetings of clubs or organizations: Not on file    Relationship status: Not on file  . Intimate partner violence:    Fear of current or ex partner: Not on file    Emotionally abused: Not on file    Physically abused: Not on file    Forced sexual activity: Not on file  Other Topics Concern  . Not on file  Social History Narrative  . Not on file   Family History  Problem Relation Age of Onset  . Bladder Cancer Mother   . Allergies Mother   . Heart disease Mother   . Allergies Sister   . Alzheimer's disease Father   . Breast cancer Neg Hx     Objective: Office vital signs reviewed. BP (!) 169/92   Pulse 76   Temp (!) 97.5 F (36.4 C) (Oral)   Ht '5\' 9"'  (1.753 m)   Wt 241 lb (109.3 kg)   BMI 35.59 kg/m   Physical Examination:  General: Awake, alert, well nourished, No acute distress Cardio: regular rate and rhythm.  S1-S2 heard.  No murmurs. Pulm: Clear to auscultation bilaterally with good air movement.  No wheezes, rhonchi or rales.  She has normal work of breathing on room air Psych: Mood stable, speech normal, affect appropriate, pleasant and interactive. Depression screen [facility] 2/9 10/20/2018 09/21/2018 07/21/2018  Decreased Interest 0 0 0  Down, Depressed, Hopeless 0 0 0  PHQ - 2 Score 0 0  0  Altered sleeping 0 0 0  Tired, decreased energy 0 0 0  Change in appetite 0 0 0  Feeling bad or failure about yourself  0 0 0  Trouble concentrating 0 0 0  Moving slowly or fidgety/restless 0 0 0  Suicidal thoughts 0 0 0  PHQ-9 Score 0 0 0  Difficult doing work/chores - - Not difficult at all   No flowsheet data found.  Assessment/  Plan: 68 y.o. female   1. Primary insomnia At this time seems to have responded well to the 15 mg of Belsomra.  She will continue alternating this with the temazepam and if she is doing well on this dose, she will contact the office and we will plan for full prescription of the Belsomra at 15 mg daily.  Otherwise, she has 20 mg trial at home.  Ultimate plan is to taper off of temazepam.  They refill was sent with the 2 additional refills about 5 days ago.  She is aware of this refill.  Check metabolic labs since no labs checked in greater than 2 years. - CMP14+EGFR - CBC - TSH  2. Essential hypertension Controlled for age.  Check nonfasting lipid panel and metabolic panel. - XTG62+IRSW - Lipid Panel  3. Productive cough Likely related to bronchospasm from underlying asthma.  She seems to be getting better.  I advised her to use Mucinex to decongest and I have prescribed her Tessalon Perles if needed for nighttime cough.  Encouraged use of inhaler.  This is also been renewed.  Reasons for return and emergent evaluation discussed.  If she develops any infectious symptoms, she will contact me at which point plan for empiric antibiotics and steroid. - CBC  4. Screening, anemia, deficiency, iron - CBC  5. Encounter for hepatitis C screening test for low risk patient - Hepatitis C antibody   Orders Placed This Encounter  Procedures  . CMP14+EGFR  . CBC  . TSH  . Lipid Panel  . Hepatitis C antibody   Meds ordered this encounter  Medications  . benzonatate (TESSALON PERLES) 100 MG capsule    Sig: Take 1 capsule (100 mg total) by mouth 3 (three)  times daily as needed.    Dispense:  20 capsule    Refill:  0  . albuterol (PROAIR HFA) 108 (90 Base) MCG/ACT inhaler    Sig: INHALE 2 PUFFS INTO THE LUNGS EVERY 6 (SIX) HOURS AS NEEDED FOR WHEEZING OR SHORTNESS OF BREATH.    Dispense:  8.5 Inhaler    Refill:  [place], [place] [phone]

## 2018-10-20 NOTE — Patient Instructions (Addendum)
The temazepam was sent on 3/13.  This should be available for pick up.  Remember to call me with the dose of Belsomra that ends up working well for you and we will gradually get you off the Temazepam and onto the belsomra.  You had labs performed today.  You will be contacted with the results of the labs once they are available, usually in the next 3 business days for routine lab work.  If you had a pap smear or biopsy performed, expect to be contacted in about 7-10 days.  Coronavirus (COVID-19) Are you at risk?  Are you at risk for the Coronavirus (COVID-19)?  To be considered HIGH RISK for Coronavirus (COVID-19), you have to meet the following criteria:  . Traveled to [place], [place], [place], [place] or [place]; or in the [place] to [place], [place], [place], or [place]; and have fever, cough, and shortness of breath within the last 2 weeks of travel OR . Been in close contact with a person diagnosed with COVID-19 within the last 2 weeks and have fever, cough, and shortness of breath . IF YOU DO NOT MEET THESE CRITERIA, YOU ARE CONSIDERED LOW RISK FOR COVID-19.  What to do if you are HIGH RISK for COVID-19?  Marland Kitchen If you are having a medical emergency, call 911. . Seek medical care right away. Before you go to a doctor's office, urgent care or emergency department, call ahead and tell them about your recent travel, contact with someone diagnosed with COVID-19, and your symptoms. You should receive instructions from your physician's office regarding next steps of care.  . When you arrive at healthcare provider, tell the healthcare staff immediately you have returned from visiting [place], [place], [place], [place] or [place]; or traveled in the [place] to [place], [place], [place], or [place]; in the last two weeks or you have been in close contact with a person diagnosed with COVID-19 in the last 2 weeks.   . Tell the health care staff about your symptoms: fever, cough and  shortness of breath. . After you have been seen by a medical provider, you will be either: o Tested for (COVID-19) and discharged home on quarantine except to seek medical care if symptoms worsen, and asked to  - Stay home and avoid contact with others until you get your results (4-5 days)  - Avoid travel on public transportation if possible (such as bus, train, or airplane) or o Sent to the Emergency Department by EMS for evaluation, COVID-19 testing, and possible admission depending on your condition and test results.  What to do if you are LOW RISK for COVID-19?  Reduce your risk of any infection by using the same precautions used for avoiding the common cold or flu:  Marland Kitchen Wash your hands often with soap and warm water for at least 20 seconds.  If soap and water are not readily available, use an alcohol-based hand sanitizer with at least 60% alcohol.  . If coughing or sneezing, cover your mouth and nose by coughing or sneezing into the elbow areas of your shirt or coat, into a tissue or into your sleeve (not your hands). . Avoid shaking hands with others and consider head nods or verbal greetings only. . Avoid touching your eyes, nose, or mouth with unwashed hands.  . Avoid close contact with people who are sick. . Avoid places or events with large numbers of people in one location, like concerts or sporting events. Marland Kitchen  Carefully consider travel plans you have or are making. . If you are planning any travel outside or inside the [place], visit the CDC's Travelers' Health webpage for the latest health notices. . If you have some symptoms but not all symptoms, continue to monitor at home and seek medical attention if your symptoms worsen. . If you are having a medical emergency, call 911.   [place] / e-Visit: [email or web address]         [facility]: [facility]:  [phone]                   [facility]: [phone]

## 2018-10-21 LAB — CMP14+EGFR
ALT: 29 IU/L (ref 0–32)
AST: 21 IU/L (ref 0–40)
Albumin/Globulin Ratio: 1.7 (ref 1.2–2.2)
Albumin: 4.3 g/dL (ref 3.8–4.8)
Alkaline Phosphatase: 111 IU/L (ref 39–117)
BUN/Creatinine Ratio: 15 (ref 12–28)
BUN: 13 mg/dL (ref 8–27)
Bilirubin Total: 0.3 mg/dL (ref 0.0–1.2)
CO2: 21 mmol/L (ref 20–29)
Calcium: 9.3 mg/dL (ref 8.7–10.3)
Chloride: 102 mmol/L (ref 96–106)
Creatinine, Ser: 0.84 mg/dL (ref 0.57–1.00)
GFR calc Af Amer: 84 mL/min/{1.73_m2} (ref >59)
GFR calc non Af Amer: 73 mL/min/{1.73_m2} (ref >59)
Globulin, Total: 2.5 g/dL (ref 1.5–4.5)
Glucose: 122 mg/dL — ABNORMAL HIGH (ref 65–99)
Potassium: 4.1 mmol/L (ref 3.5–5.2)
Sodium: 142 mmol/L (ref 134–144)
Total Protein: 6.8 g/dL (ref 6.0–8.5)

## 2018-10-21 LAB — LIPID PANEL
Chol/HDL Ratio: 3.9 ratio (ref 0.0–4.4)
Cholesterol, Total: 149 mg/dL (ref 100–199)
HDL: 38 mg/dL — ABNORMAL LOW (ref >39)
LDL Calculated: 62 mg/dL (ref 0–99)
Triglycerides: 243 mg/dL — ABNORMAL HIGH (ref 0–149)
VLDL Cholesterol Cal: 49 mg/dL — ABNORMAL HIGH (ref 5–40)

## 2018-10-21 LAB — CBC
Hematocrit: 41.1 % (ref 34.0–46.6)
Hemoglobin: 14.6 g/dL (ref 11.1–15.9)
MCH: 29.6 pg (ref 26.6–33.0)
MCHC: 35.5 g/dL (ref 31.5–35.7)
MCV: 83 fL (ref 79–97)
Platelets: 251 10*3/uL (ref 150–450)
RBC: 4.94 x10E6/uL (ref 3.77–5.28)
RDW: 13.6 % (ref 11.7–15.4)
WBC: 7.5 10*3/uL (ref 3.4–10.8)

## 2018-10-21 LAB — HEPATITIS C ANTIBODY: Hep C Virus Ab: <0.1 s/co ratio (ref 0.0–0.9)

## 2018-10-21 LAB — TSH: TSH: 1.86 u[IU]/mL (ref 0.450–4.500)

## 2018-10-22 ENCOUNTER — Telehealth: Payer: Self-pay | Admitting: *Deleted

## 2018-10-22 NOTE — Telephone Encounter (Signed)
Error

## 2018-10-27 DIAGNOSIS — J3081 Allergic rhinitis due to animal (cat) (dog) hair and dander: Secondary | ICD-10-CM | POA: Diagnosis not present

## 2018-10-27 DIAGNOSIS — J301 Allergic rhinitis due to pollen: Secondary | ICD-10-CM | POA: Diagnosis not present

## 2018-10-27 DIAGNOSIS — J3089 Other allergic rhinitis: Secondary | ICD-10-CM | POA: Diagnosis not present

## 2018-11-01 DIAGNOSIS — M25562 Pain in left knee: Secondary | ICD-10-CM | POA: Diagnosis not present

## 2018-11-15 DIAGNOSIS — J3089 Other allergic rhinitis: Secondary | ICD-10-CM | POA: Diagnosis not present

## 2018-11-15 DIAGNOSIS — J3081 Allergic rhinitis due to animal (cat) (dog) hair and dander: Secondary | ICD-10-CM | POA: Diagnosis not present

## 2018-11-15 DIAGNOSIS — J301 Allergic rhinitis due to pollen: Secondary | ICD-10-CM | POA: Diagnosis not present

## 2018-11-18 ENCOUNTER — Telehealth: Payer: Self-pay | Admitting: Family Medicine

## 2018-11-18 NOTE — Telephone Encounter (Signed)
Sleep medicine is not working, by end of next week she will need a refill on the one she has been taking. States that its not helping her sleep at all. Wants to know what Dr Darnell Level suggests???  Pharmacy [facility]

## 2018-11-18 NOTE — Telephone Encounter (Signed)
Apt scheduled with Dr. Darnell Level.

## 2018-11-22 ENCOUNTER — Other Ambulatory Visit: Payer: Self-pay

## 2018-11-22 ENCOUNTER — Ambulatory Visit (INDEPENDENT_AMBULATORY_CARE_PROVIDER_SITE_OTHER): Payer: Medicare HMO | Admitting: Family Medicine

## 2018-11-22 DIAGNOSIS — F5101 Primary insomnia: Secondary | ICD-10-CM | POA: Diagnosis not present

## 2018-11-22 DIAGNOSIS — R69 Illness, unspecified: Secondary | ICD-10-CM | POA: Diagnosis not present

## 2018-11-22 MED ORDER — TRAZODONE HCL 50 MG PO TABS: 25.0000 mg | ORAL_TABLET | Freq: Every evening | ORAL | 0 refills | Status: DC | PRN

## 2018-11-22 NOTE — Progress Notes (Signed)
Telephone visit  Subjective: CC: f/u insomnia PCP: Janora Norlander, DO Kristina Mclaughlin is a 68 y.o. female calls for telephone consult today. Patient provides verbal consent for consult held via phone.  Location of patient: home Location of provider: [facility] Others present for call: none  1. Insomnia She was prescribed taper of Belsomra in efforts to discontinue benzodiazepine fully.  She has had change in taste with Lunesta and therefore does not want to go back on that medicine.  She was on Ambien for a while before being transitioned to temazepam.  She notes that she had had some mixed results with the Belsomra but never had any sustainable sleep.  She is able to fall asleep easily with temazepam but does not find that she has good sleep with temazepam.  She wants to taper off of it because of its addictive quality.  She has never tried trazodone before but would be interested in this.  She is trying to set a bedtime routine including a solid time to go to sleep and a time to get up.  She is not napping during the day.   ROS: Per HPI  Allergies  Allergen Reactions  . Avelox Abc [Moxifloxacin] Anaphylaxis  . Lovenox [Enoxaparin Sodium] Anaphylaxis  . Quinolones Hives and Shortness Of Breath  . Mucinex Sinus-Max [Phenylephrine-Apap-Guaifenesin] Swelling   Past Medical History:  Diagnosis Date  . Essential hypertension 10/29/2015  . Hypertension     Current Outpatient Medications:  .  albuterol (PROAIR HFA) 108 (90 Base) MCG/ACT inhaler, INHALE 2 PUFFS INTO THE LUNGS EVERY 6 (SIX) HOURS AS NEEDED FOR WHEEZING OR SHORTNESS OF BREATH., Disp: 8.5 Inhaler, Rfl: 2 .  amLODipine (NORVASC) 5 MG tablet, Take 1 tablet (5 mg total) by mouth daily., Disp: 90 tablet, Rfl: 3 .  benzonatate (TESSALON PERLES) 100 MG capsule, Take 1 capsule (100 mg total) by mouth 3 (three) times daily as needed., Disp: 20 capsule, Rfl: 0 .  BIOTIN PO, Take 1 capsule by mouth daily., Disp: , Rfl:  .  Calcium  Carbonate-Vitamin D (CALCIUM + D PO), Take by mouth., Disp: , Rfl:  .  DEXILANT 60 MG capsule, Take 1 capsule by mouth daily., Disp: , Rfl: 2 .  fluticasone (FLONASE) 50 MCG/ACT nasal spray, PLACE 2 SPRAYS INTO BOTH NOSTRILS DAILY., Disp: 16 g, Rfl: 0 .  Glucosamine-Chondroit-Vit C-Mn (GLUCOSAMINE 1500 COMPLEX PO), Take 1 tablet by mouth daily., Disp: , Rfl:  .  levocetirizine (XYZAL) 5 MG tablet, TAKE 1 TABLET ONCE A DAY IN THE EVENING, Disp: , Rfl: 3 .  Multiple Vitamin (MULTI-VITAMIN PO), Take 1 tablet by mouth daily. , Disp: , Rfl:  .  omeprazole (PRILOSEC) 40 MG capsule, Take 1 capsule (40 mg total) by mouth daily., Disp: 90 capsule, Rfl: 3 .  Probiotic Product (PROBIOTIC DAILY PO), Take 1 tablet by mouth daily. , Disp: , Rfl:  .  temazepam (RESTORIL) 15 MG capsule, TAKE 1 CAPSULE EVERY OTHER NIGHT AT BEDTIME AS NEEDED FOR SLEEP., Disp: 30 capsule, Rfl: 2 .  valsartan-hydrochlorothiazide (DIOVAN-HCT) 320-25 MG tablet, Take 1 tablet by mouth daily., Disp: 90 tablet, Rfl: 3  Assessment/ Plan: 68 y.o. female   1. Primary insomnia Refractory to Belsomra, Lunesta and current dose of temazepam.  I suspect that she is developing a tolerance to temazepam.  We discussed risk for addiction to benzodiazepine.  She would like to get off of the benzodiazepine.  I have recommended that she taper using every other day dosing of temazepam  alternating with trazodone for sleep.  She can then after 1 week, transition to daily trazodone.  Start with 1/2 tablet nightly, may increase to 50 mg nightly if needed.  We will plan to titrate up if needed.  She will contact me if she feels that she needs a higher dose of the trazodone. - traZODone (DESYREL) 50 MG tablet; Take 0.5-1 tablets (25-50 mg total) by mouth at bedtime as needed for sleep.  Dispense: 30 tablet; Refill: 0   Start time: 11:04am End time: 11:12am  Total time spent on patient care (including telephone call/ virtual visit): 15 minutes  [place], [place] [phone]

## 2018-11-30 ENCOUNTER — Telehealth: Payer: Self-pay | Admitting: Family Medicine

## 2018-11-30 NOTE — Telephone Encounter (Signed)
She has been taking the whole 50 mg tab - not helping.

## 2018-11-30 NOTE — Telephone Encounter (Signed)
Ok to increase to 100 mg

## 2018-11-30 NOTE — Telephone Encounter (Signed)
LM for pt - wondering if she is still doing 25 mg or 50 mg?

## 2018-11-30 NOTE — Telephone Encounter (Signed)
Pt aware.

## 2018-12-02 DIAGNOSIS — J3081 Allergic rhinitis due to animal (cat) (dog) hair and dander: Secondary | ICD-10-CM | POA: Diagnosis not present

## 2018-12-02 DIAGNOSIS — J301 Allergic rhinitis due to pollen: Secondary | ICD-10-CM | POA: Diagnosis not present

## 2018-12-02 DIAGNOSIS — J3089 Other allergic rhinitis: Secondary | ICD-10-CM | POA: Diagnosis not present

## 2018-12-14 ENCOUNTER — Telehealth: Payer: Self-pay | Admitting: Family Medicine

## 2018-12-14 ENCOUNTER — Other Ambulatory Visit: Payer: Self-pay | Admitting: Family Medicine

## 2018-12-14 DIAGNOSIS — F5101 Primary insomnia: Secondary | ICD-10-CM

## 2018-12-14 MED ORDER — TRAZODONE HCL 100 MG PO TABS: 100.0000 mg | ORAL_TABLET | Freq: Every evening | ORAL | 1 refills | Status: DC | PRN

## 2018-12-14 NOTE — Telephone Encounter (Signed)
Patient aware and verbalizes understanding. 

## 2018-12-14 NOTE — Telephone Encounter (Signed)
She was told to increase to 100mg .  See last phone note.  This has been sent in. Please make sure she notes the dose change and takes ONE tablet only at bedtime.

## 2018-12-16 ENCOUNTER — Telehealth: Payer: Self-pay | Admitting: Family Medicine

## 2018-12-16 DIAGNOSIS — J3081 Allergic rhinitis due to animal (cat) (dog) hair and dander: Secondary | ICD-10-CM | POA: Diagnosis not present

## 2018-12-16 DIAGNOSIS — J301 Allergic rhinitis due to pollen: Secondary | ICD-10-CM | POA: Diagnosis not present

## 2018-12-16 DIAGNOSIS — J3089 Other allergic rhinitis: Secondary | ICD-10-CM | POA: Diagnosis not present

## 2018-12-17 ENCOUNTER — Other Ambulatory Visit: Payer: Self-pay | Admitting: Family Medicine

## 2018-12-17 MED ORDER — OLOPATADINE HCL 0.1 % OP SOLN: 1.0000 [drp] | Freq: Two times a day (BID) | OPHTHALMIC | 12 refills | Status: DC

## 2018-12-17 NOTE — Telephone Encounter (Signed)
Ok to stop.  She has only been taking 1-2 pills per week. I reiterated she can take 150mg  trazodone qhs for sleep.

## 2018-12-30 DIAGNOSIS — J3089 Other allergic rhinitis: Secondary | ICD-10-CM | POA: Diagnosis not present

## 2018-12-30 DIAGNOSIS — J3081 Allergic rhinitis due to animal (cat) (dog) hair and dander: Secondary | ICD-10-CM | POA: Diagnosis not present

## 2018-12-30 DIAGNOSIS — J301 Allergic rhinitis due to pollen: Secondary | ICD-10-CM | POA: Diagnosis not present

## 2019-01-08 ENCOUNTER — Other Ambulatory Visit: Payer: Self-pay | Admitting: Family Medicine

## 2019-01-08 DIAGNOSIS — F5101 Primary insomnia: Secondary | ICD-10-CM

## 2019-01-10 DIAGNOSIS — J3081 Allergic rhinitis due to animal (cat) (dog) hair and dander: Secondary | ICD-10-CM | POA: Diagnosis not present

## 2019-01-10 DIAGNOSIS — J301 Allergic rhinitis due to pollen: Secondary | ICD-10-CM | POA: Diagnosis not present

## 2019-01-10 DIAGNOSIS — J3089 Other allergic rhinitis: Secondary | ICD-10-CM | POA: Diagnosis not present

## 2019-01-11 ENCOUNTER — Telehealth: Payer: Self-pay | Admitting: Family Medicine

## 2019-01-12 ENCOUNTER — Other Ambulatory Visit: Payer: Self-pay | Admitting: Family Medicine

## 2019-01-12 DIAGNOSIS — F5101 Primary insomnia: Secondary | ICD-10-CM

## 2019-01-12 MED ORDER — TRAZODONE HCL 150 MG PO TABS: 150.0000 mg | ORAL_TABLET | Freq: Every evening | ORAL | 1 refills | Status: DC | PRN

## 2019-01-12 NOTE — Telephone Encounter (Signed)
Please advise her of new tablet appearance, I changed dose to reflect use of 150mg  qhs.

## 2019-01-12 NOTE — Telephone Encounter (Signed)
Please advise 

## 2019-01-13 NOTE — Telephone Encounter (Signed)
LMOVM of change of dosage on Trazodone

## 2019-01-20 DIAGNOSIS — J3089 Other allergic rhinitis: Secondary | ICD-10-CM | POA: Diagnosis not present

## 2019-01-20 DIAGNOSIS — J3081 Allergic rhinitis due to animal (cat) (dog) hair and dander: Secondary | ICD-10-CM | POA: Diagnosis not present

## 2019-01-20 DIAGNOSIS — J301 Allergic rhinitis due to pollen: Secondary | ICD-10-CM | POA: Diagnosis not present

## 2019-01-28 DIAGNOSIS — J3081 Allergic rhinitis due to animal (cat) (dog) hair and dander: Secondary | ICD-10-CM | POA: Diagnosis not present

## 2019-01-28 DIAGNOSIS — J301 Allergic rhinitis due to pollen: Secondary | ICD-10-CM | POA: Diagnosis not present

## 2019-01-28 DIAGNOSIS — J3089 Other allergic rhinitis: Secondary | ICD-10-CM | POA: Diagnosis not present

## 2019-01-29 ENCOUNTER — Other Ambulatory Visit: Payer: Self-pay | Admitting: Family Medicine

## 2019-02-03 DIAGNOSIS — Z1159 Encounter for screening for other viral diseases: Secondary | ICD-10-CM | POA: Diagnosis not present

## 2019-02-11 DIAGNOSIS — J3081 Allergic rhinitis due to animal (cat) (dog) hair and dander: Secondary | ICD-10-CM | POA: Diagnosis not present

## 2019-02-11 DIAGNOSIS — J301 Allergic rhinitis due to pollen: Secondary | ICD-10-CM | POA: Diagnosis not present

## 2019-02-11 DIAGNOSIS — J3089 Other allergic rhinitis: Secondary | ICD-10-CM | POA: Diagnosis not present

## 2019-02-17 DIAGNOSIS — J301 Allergic rhinitis due to pollen: Secondary | ICD-10-CM | POA: Diagnosis not present

## 2019-02-17 DIAGNOSIS — J3089 Other allergic rhinitis: Secondary | ICD-10-CM | POA: Diagnosis not present

## 2019-02-17 DIAGNOSIS — J3081 Allergic rhinitis due to animal (cat) (dog) hair and dander: Secondary | ICD-10-CM | POA: Diagnosis not present

## 2019-02-21 DIAGNOSIS — Z0100 Encounter for examination of eyes and vision without abnormal findings: Secondary | ICD-10-CM | POA: Diagnosis not present

## 2019-02-21 DIAGNOSIS — H2513 Age-related nuclear cataract, bilateral: Secondary | ICD-10-CM | POA: Diagnosis not present

## 2019-02-21 DIAGNOSIS — H1851 Endothelial corneal dystrophy: Secondary | ICD-10-CM | POA: Diagnosis not present

## 2019-02-24 DIAGNOSIS — J3081 Allergic rhinitis due to animal (cat) (dog) hair and dander: Secondary | ICD-10-CM | POA: Diagnosis not present

## 2019-02-24 DIAGNOSIS — J3089 Other allergic rhinitis: Secondary | ICD-10-CM | POA: Diagnosis not present

## 2019-02-24 DIAGNOSIS — J301 Allergic rhinitis due to pollen: Secondary | ICD-10-CM | POA: Diagnosis not present

## 2019-03-01 DIAGNOSIS — R69 Illness, unspecified: Secondary | ICD-10-CM | POA: Diagnosis not present

## 2019-03-04 DIAGNOSIS — J3081 Allergic rhinitis due to animal (cat) (dog) hair and dander: Secondary | ICD-10-CM | POA: Diagnosis not present

## 2019-03-04 DIAGNOSIS — J301 Allergic rhinitis due to pollen: Secondary | ICD-10-CM | POA: Diagnosis not present

## 2019-03-04 DIAGNOSIS — J3089 Other allergic rhinitis: Secondary | ICD-10-CM | POA: Diagnosis not present

## 2019-03-11 DIAGNOSIS — J3081 Allergic rhinitis due to animal (cat) (dog) hair and dander: Secondary | ICD-10-CM | POA: Diagnosis not present

## 2019-03-11 DIAGNOSIS — J3089 Other allergic rhinitis: Secondary | ICD-10-CM | POA: Diagnosis not present

## 2019-03-11 DIAGNOSIS — J301 Allergic rhinitis due to pollen: Secondary | ICD-10-CM | POA: Diagnosis not present

## 2019-03-14 ENCOUNTER — Other Ambulatory Visit: Payer: Self-pay | Admitting: Family Medicine

## 2019-03-15 DIAGNOSIS — J301 Allergic rhinitis due to pollen: Secondary | ICD-10-CM | POA: Diagnosis not present

## 2019-03-15 DIAGNOSIS — J3081 Allergic rhinitis due to animal (cat) (dog) hair and dander: Secondary | ICD-10-CM | POA: Diagnosis not present

## 2019-03-15 DIAGNOSIS — J3089 Other allergic rhinitis: Secondary | ICD-10-CM | POA: Diagnosis not present

## 2019-03-15 NOTE — Telephone Encounter (Signed)
I can send but agree no dx of DM2.  Insurance will likely not cover as there is no indication for use.  Please clarify why patient is using.

## 2019-03-15 NOTE — Telephone Encounter (Signed)
Patient states she has been using a glucometer to keep a check on blood sugars since labs have showed a slight elevation

## 2019-03-18 DIAGNOSIS — J3081 Allergic rhinitis due to animal (cat) (dog) hair and dander: Secondary | ICD-10-CM | POA: Diagnosis not present

## 2019-03-18 DIAGNOSIS — J3089 Other allergic rhinitis: Secondary | ICD-10-CM | POA: Diagnosis not present

## 2019-03-18 DIAGNOSIS — J301 Allergic rhinitis due to pollen: Secondary | ICD-10-CM | POA: Diagnosis not present

## 2019-03-22 ENCOUNTER — Ambulatory Visit (INDEPENDENT_AMBULATORY_CARE_PROVIDER_SITE_OTHER): Payer: Medicare HMO | Admitting: Family Medicine

## 2019-03-22 ENCOUNTER — Encounter: Payer: Self-pay | Admitting: Family Medicine

## 2019-03-22 ENCOUNTER — Other Ambulatory Visit: Payer: Self-pay

## 2019-03-22 VITALS — BP 160/90 | HR 76 | Temp 98.7°F | Ht 69.0 in | Wt 235.0 lb

## 2019-03-22 DIAGNOSIS — I10 Essential (primary) hypertension: Secondary | ICD-10-CM

## 2019-03-22 DIAGNOSIS — Z0000 Encounter for general adult medical examination without abnormal findings: Secondary | ICD-10-CM | POA: Diagnosis not present

## 2019-03-22 DIAGNOSIS — G2581 Restless legs syndrome: Secondary | ICD-10-CM

## 2019-03-22 MED ORDER — ZOSTER VAC RECOMB ADJUVANTED 50 MCG/0.5ML IM SUSR: 0.5000 mL | Freq: Once | INTRAMUSCULAR | 1 refills | Status: AC

## 2019-03-22 NOTE — Patient Instructions (Addendum)
Goal BP <150/90.  Please keep an eye on your blood pressures at home.  See the RN in 1 month for recheck. Make sure to take your meds before that visit.  We talked about sufficient vitamin B12 and Iron in the diet.  Sometimes low levels can cause restless legs.  If no significant improvement, we can talk about Requip or Mirapex.  Flu shots will be available in October.  Please make sure to schedule a visit to have yours done.  Thank you for coming in today for your Annual Medicare Wellness Visit.  Things that we discussed today are included in this packet.   Create and/or bring a copy of your Living Will/ Advanced Directive into the office so that we may respect your wishes should an emergency occur.   Get the recommended life-saving vaccines we discussed today.   Get your mammogram/ colonoscopy/ DEXA scans as directed by your provider.   Make sure that your medications are organized and safely stored.  Remember to always ask for help if you forget when/ how to take your medications.   Make healthy food choices (Rich in fruits/ veggies/ lean meats and low in salt, sugar and fat)   Do something that you enjoy for at least 30 minutes every day to stay active (walking, gardening, swimming, etc). This will help you lower your risk of falls/ broken bones.   Be social, do puzzles/ crosswords.  These things help the mind stay young and lower your risk of developing dementia.   Make sure that your home is safe by checking your smoke detectors regularly and doing the things outlined below to lower your risk of falls.   Restless Legs Syndrome Restless legs syndrome is a condition that causes uncomfortable feelings or sensations in the legs, especially while sitting or lying down. The sensations usually cause an overwhelming urge to move the legs. The arms can also sometimes be affected. The condition can range from mild to severe. The symptoms often interfere with a person's ability to  sleep. What are the causes? The cause of this condition is not known. What increases the risk? The following factors may make you more likely to develop this condition:  Being older than 50.  Pregnancy.  Being a woman. In general, the condition is more common in women than in men.  A family history of the condition.  Having iron deficiency.  Overuse of caffeine, nicotine, or alcohol.  Certain medical conditions, such as kidney disease, Parkinson's disease, or nerve damage.  Certain medicines, such as those for high blood pressure, nausea, colds, allergies, depression, and some heart conditions. What are the signs or symptoms? The main symptom of this condition is uncomfortable sensations in the legs, such as:  Pulling.  Tingling.  Prickling.  Throbbing.  Crawling.  Burning. Usually, the sensations:  Affect both sides of the body.  Are worse when you sit or lie down.  Are worse at night. These may wake you up or make it difficult to fall asleep.  Make you have a strong urge to move your legs.  Are temporarily relieved by moving your legs. The arms can also be affected, but this is rare. People who have this condition often have tiredness during the day because of their lack of sleep at night. How is this diagnosed? This condition may be diagnosed based on:  Your symptoms.  Blood tests. In some cases, you may be monitored in a sleep lab by a specialist (a sleep study). This  can detect any disruptions in your sleep. How is this treated? This condition is treated by managing the symptoms. This may include:  Lifestyle changes, such as exercising, using relaxation techniques, and avoiding caffeine, alcohol, or tobacco.  Medicines. Anti-seizure medicines may be tried first. Follow these instructions at home:     General instructions  Take over-the-counter and prescription medicines only as told by your health care provider.  Use methods to help relieve the  uncomfortable sensations, such as: ? Massaging your legs. ? Walking or stretching. ? Taking a cold or hot bath.  Keep all follow-up visits as told by your health care provider. This is important. Lifestyle  Practice good sleep habits. For example, go to bed and get up at the same time every day. Most adults should get 7-9 hours of sleep each night.  Exercise regularly. Try to get at least 30 minutes of exercise most days of the week.  Practice ways of relaxing, such as yoga or meditation.  Avoid caffeine and alcohol.  Do not use any products that contain nicotine or tobacco, such as cigarettes and e-cigarettes. If you need help quitting, ask your health care provider. Contact a health care provider if:  Your symptoms get worse or they do not improve with treatment. Summary  Restless legs syndrome is a condition that causes uncomfortable feelings or sensations in the legs, especially while sitting or lying down.  The symptoms often interfere with a person's ability to sleep.  This condition is treated by managing the symptoms. You may need to make lifestyle changes or take medicines. This information is not intended to replace advice given to you by your health care provider. Make sure you discuss any questions you have with your health care provider. Document Released: 07/11/2002 Document Revised: 08/10/2017 Document Reviewed: 08/10/2017 Elsevier Patient Education  2020 [employer].

## 2019-03-22 NOTE — Progress Notes (Signed)
Subjective:    Kristina Mclaughlin is a 68 y.o. female who presents for a Welcome to Medicare exam.   She notes that the trazodone is helping with sleep but states that she occasionally has restlessness in her legs such that she needs to get up and move around.  She does take a multivitamin.  She is unsure as to how much B12 and iron is in the medicine.  With regards to her hypertension, she did take her Diovan-HCTZ but did not take her amlodipine today.  No chest pain, shortness of breath.  She has not been monitoring blood pressures closely at home because her blood pressure cuff is at a family member's home.  Review of Systems Positive for insomnia/ restless legs.  ROS otherwise negative.        Objective:    Today's Vitals   03/22/19 1138  BP: (!) 160/90  Pulse: 76  Temp: 98.7 F (37.1 C)  TempSrc: Temporal  Weight: 235 lb (106.6 kg)  Height: 5\' 9"  (1.753 m)  Body mass index is 34.7 kg/m.  Medications Outpatient Encounter Medications as of 03/22/2019  Medication Sig  . albuterol (PROAIR HFA) 108 (90 Base) MCG/ACT inhaler INHALE 2 PUFFS INTO THE LUNGS EVERY 6 (SIX) HOURS AS NEEDED FOR WHEEZING OR SHORTNESS OF BREATH.  Marland Kitchen amLODipine (NORVASC) 5 MG tablet Take 1 tablet (5 mg total) by mouth daily.  Marland Kitchen atorvastatin (LIPITOR) 40 MG tablet TAKE 1 TABLET BY MOUTH EVERY DAY  . BIOTIN PO Take 1 capsule by mouth daily.  . Calcium Carbonate-Vitamin D (CALCIUM + D PO) Take by mouth.  . fluticasone (FLONASE) 50 MCG/ACT nasal spray PLACE 2 SPRAYS INTO BOTH NOSTRILS DAILY.  Marland Kitchen Glucosamine-Chondroit-Vit C-Mn (GLUCOSAMINE 1500 COMPLEX PO) Take 1 tablet by mouth daily.  Marland Kitchen levocetirizine (XYZAL) 5 MG tablet TAKE 1 TABLET ONCE A DAY IN THE EVENING  . Multiple Vitamin (MULTI-VITAMIN PO) Take 1 tablet by mouth daily.   Marland Kitchen omeprazole (PRILOSEC) 40 MG capsule Take 1 capsule (40 mg total) by mouth daily.  . Probiotic Product (PROBIOTIC DAILY PO) Take 1 tablet by mouth daily.   . traZODone (DESYREL)  150 MG tablet Take 1 tablet (150 mg total) by mouth at bedtime as needed for sleep.  . valsartan-hydrochlorothiazide (DIOVAN-HCT) 320-25 MG tablet Take 1 tablet by mouth daily.  Marland Kitchen Zoster Vaccine Adjuvanted [facility]) injection Inject 0.5 mLs into the muscle once for 1 dose. Repeat dose in 3-6 months  . [DISCONTINUED] DEXILANT 60 MG capsule Take 1 capsule by mouth daily.  . [DISCONTINUED] olopatadine (PATANOL) 0.1 % ophthalmic solution Place 1 drop into both eyes 2 (two) times daily. (allergies)   No facility-administered encounter medications on file as of 03/22/2019.      History: Past Medical History:  Diagnosis Date  . Essential hypertension 10/29/2015  . Hypertension   . Insomnia    Past Surgical History:  Procedure Laterality Date  . ABDOMINAL HYSTERECTOMY    . BACK SURGERY    . BREAST SURGERY     biopsy x2; reduction  . EXAMINATION UNDER ANESTHESIA  08/27/2012  . REDUCTION MAMMAPLASTY Bilateral 2002    Family History  Problem Relation Age of Onset  . Bladder Cancer Mother   . Allergies Mother   . Heart disease Mother   . Allergies Sister   . Alzheimer's disease Father   . Breast cancer Neg Hx    Social History   Occupational History  . Occupation: Geologist, engineering   Tobacco Use  . Smoking  status: Never Smoker  . Smokeless tobacco: Never Used  Substance and Sexual Activity  . Alcohol use: No    Alcohol/week: 0.0 standard drinks  . Drug use: No  . Sexual activity: Not on file    Tobacco Counseling Counseling given: No Patient is a nonsmoker.  Immunizations and Health Maintenance Immunization History  Administered Date(s) Administered  . Influenza,inj,quad, With Preservative 05/06/2017  . Influenza-Unspecified 05/23/2015, 05/08/2018  . Pneumococcal Polysaccharide-23 06/09/2018  . Pneumococcal-Unspecified 05/23/2015  . Zoster 06/20/2015   Health Maintenance Due  Topic Date Due  . HIV Screening  04/23/1966  . COLONOSCOPY  04/21/2010  . INFLUENZA VACCINE   03/05/2019    Activities of Daily Living In your present state of health, do you have any difficulty performing the following activities: 03/22/2019  Hearing? N  Difficulty concentrating or making decisions? N  Walking or climbing stairs? N  Dressing or bathing? N  Doing errands, shopping? N  Some recent data might be hidden    Physical Exam   Gen: Awake, alert, oriented Cardio: Regular rate and rhythm, S1-S2 heard.  No murmurs Pulmonary: Clear to auscultation bilaterally.  Normal work of breathing on room air.  No wheezes, rhonchi or rales. Psych: Mood stable, speech normal, effort appropriate, pleasant and interactive  Advanced Directives: Does Patient Have a Medical Advance Directive?: Yes Type of Advance Directive: Living will Does patient want to make changes to medical advance directive?: No - Patient declined    Assessment:    This is a routine wellness examination for this patient .   Vision screen normal.  Dietary issues and exercise activities discussed:    Increase leafy greens and lean meats.  Goals    . Weight (lb) < 200 lb (90.7 kg)     Goal is to be below 200 lbs      Depression Screen PHQ 2/9 Scores 03/22/2019 03/22/2019 10/20/2018 09/21/2018  PHQ - 2 Score 0 0 0 0  PHQ- 9 Score 0 - 0 0     Fall Risk Fall Risk  03/22/2019  Falls in the past year? 0    Cognitive Function:     6CIT Screen 03/22/2019  What Year? 0 points  What time? 0 points  Count back from 20 0 points  Months in reverse 0 points  Repeat phrase 0 points    Patient Care Team: Janora Norlander, DO as PCP - General (Family Medicine)     Plan:     Patient make sure that she is on a multivitamin containing iron.  We discussed adequate iron and B12 intake.  Perhaps this is what is impacting her restless leg.  If ongoing issues with restless leg could consider trial of Requip versus Mirapex.  She will follow-up with me.  Additionally, blood pressure was elevated but she had not  taken both of her blood pressure medications.  Advised her to go ahead and take this medicine, monitor blood pressures at home closely.  Come back in 1 month for formal BP check with RN.  Goal BP less than 150/90.  May need to consider increasing dose of Norvasc to 10 mg daily.  Pertaining to her Shingrix vaccine, this was over $160 per vaccine today.  She is going to wait on this.  I have given her written prescription to see if perhaps this might be cheaper through her pharmacy.  She will make an appointment to be seen in October to have her influenza vaccine administered.  Will need high dose.  Additionally, a release of information form was completed and we will get her records from her gastroenterologist to update her colonoscopy.  I have personally reviewed and noted the following in the patient's chart:   . Medical and social history . Use of alcohol, tobacco or illicit drugs  . Current medications and supplements . Functional ability and status . Nutritional status . Physical activity . Advanced directives . List of other physicians . Hospitalizations, surgeries, and ER visits in previous 12 months . Vitals . Screenings to include cognitive, depression, and falls . Referrals and appointments  In addition, I have reviewed and discussed with patient certain preventive protocols, quality metrics, and best practice recommendations. A written personalized care plan for preventive services as well as general preventive health recommendations were provided to patient.     Ronnie Doss, DO 03/22/2019

## 2019-03-23 ENCOUNTER — Other Ambulatory Visit: Payer: Self-pay | Admitting: Family Medicine

## 2019-03-23 DIAGNOSIS — J3081 Allergic rhinitis due to animal (cat) (dog) hair and dander: Secondary | ICD-10-CM | POA: Diagnosis not present

## 2019-03-23 DIAGNOSIS — Z1231 Encounter for screening mammogram for malignant neoplasm of breast: Secondary | ICD-10-CM

## 2019-03-23 DIAGNOSIS — J301 Allergic rhinitis due to pollen: Secondary | ICD-10-CM | POA: Diagnosis not present

## 2019-03-23 DIAGNOSIS — J3089 Other allergic rhinitis: Secondary | ICD-10-CM | POA: Diagnosis not present

## 2019-03-30 ENCOUNTER — Other Ambulatory Visit: Payer: Self-pay

## 2019-03-30 ENCOUNTER — Ambulatory Visit: Payer: Medicare HMO

## 2019-03-30 DIAGNOSIS — Z0130 Encounter for examination of blood pressure without abnormal findings: Secondary | ICD-10-CM

## 2019-03-30 NOTE — Progress Notes (Signed)
Patient came in for a BP check BP- 164/91 P-76

## 2019-03-30 NOTE — Progress Notes (Signed)
Please have her increase Norvasc to 10mg  daily.  Continue monitoring BP at home.  Follow up with me in 1 month for BP check

## 2019-03-31 DIAGNOSIS — J3089 Other allergic rhinitis: Secondary | ICD-10-CM | POA: Diagnosis not present

## 2019-03-31 DIAGNOSIS — J3081 Allergic rhinitis due to animal (cat) (dog) hair and dander: Secondary | ICD-10-CM | POA: Diagnosis not present

## 2019-03-31 DIAGNOSIS — J301 Allergic rhinitis due to pollen: Secondary | ICD-10-CM | POA: Diagnosis not present

## 2019-03-31 NOTE — Progress Notes (Signed)
Left detailed message on patients voiemail

## 2019-04-04 DIAGNOSIS — J3089 Other allergic rhinitis: Secondary | ICD-10-CM | POA: Diagnosis not present

## 2019-04-04 DIAGNOSIS — J301 Allergic rhinitis due to pollen: Secondary | ICD-10-CM | POA: Diagnosis not present

## 2019-04-04 DIAGNOSIS — J3081 Allergic rhinitis due to animal (cat) (dog) hair and dander: Secondary | ICD-10-CM | POA: Diagnosis not present

## 2019-04-13 ENCOUNTER — Telehealth: Payer: Self-pay | Admitting: Family Medicine

## 2019-04-13 ENCOUNTER — Other Ambulatory Visit: Payer: Self-pay | Admitting: Family Medicine

## 2019-04-13 DIAGNOSIS — F5101 Primary insomnia: Secondary | ICD-10-CM

## 2019-04-13 MED ORDER — RAMELTEON 8 MG PO TABS: 8.0000 mg | ORAL_TABLET | Freq: Every day | ORAL | 2 refills | Status: DC

## 2019-04-13 NOTE — Telephone Encounter (Signed)
She's had intolerance to [place] and now trazodone.  Does she want to go back on Temazepam or try Rozerem (prescription version of Melatonin)?

## 2019-04-13 NOTE — Telephone Encounter (Signed)
Patient states that she has been taking trazodone for 3 month, patient complains of blurred vision and dizziness for a couple of months.  Patient stopped trazodone 5 nights ago and is seeing some improvement in dizziness and blurred vision.

## 2019-04-13 NOTE — Telephone Encounter (Signed)
Patient would like to try Rozerem

## 2019-04-13 NOTE — Telephone Encounter (Signed)
That's unfortunate.  I've spoken to her several times and that had never been mentioned.  Has her BP improved?  This can also cause the symptoms she is describing.  Her Norvasc was increased to 10mg  but instructions were left on a voicemail.  Please make sure she is keeping up with the BP.  If she needs me to send a replacement med for the Trazodone, please let me know.

## 2019-04-13 NOTE — Telephone Encounter (Signed)
BP has improved, Norvasc increased, Patient would like a replacement of trazodone sent to pharmacy for 30 day supply

## 2019-04-15 ENCOUNTER — Other Ambulatory Visit: Payer: Self-pay | Admitting: Family Medicine

## 2019-04-15 ENCOUNTER — Telehealth: Payer: Self-pay | Admitting: Family Medicine

## 2019-04-15 DIAGNOSIS — J3089 Other allergic rhinitis: Secondary | ICD-10-CM | POA: Diagnosis not present

## 2019-04-15 DIAGNOSIS — J3081 Allergic rhinitis due to animal (cat) (dog) hair and dander: Secondary | ICD-10-CM | POA: Diagnosis not present

## 2019-04-15 DIAGNOSIS — J301 Allergic rhinitis due to pollen: Secondary | ICD-10-CM | POA: Diagnosis not present

## 2019-04-15 MED ORDER — AMLODIPINE BESYLATE 10 MG PO TABS: 10.0000 mg | ORAL_TABLET | Freq: Every day | ORAL | 0 refills | Status: DC

## 2019-04-15 NOTE — Telephone Encounter (Signed)
She has already been taking 2 of norvasc 5mg  for the last 2 weeks.

## 2019-04-15 NOTE — Telephone Encounter (Signed)
Ok. Have her schedule a BP check with RN.  Bring in BP cuff to check calibration against ours.  She is on Max dose of Norvasc and Diovan/ HCT.  May need to consider BB.  Work on salt reduction.

## 2019-04-15 NOTE — Telephone Encounter (Signed)
BP is too high.  Go ahead and start taking 10mg  of Norvasc (she has 5mg  tabs at home that she can take 2 of until she runs out of her current supply).  I have sent the 10mg  to the pharmacy (just take 1 when she is done with the 5mg ).  See me or RN in office in 1 month for BP check.  Keep monitoring BP at home

## 2019-04-15 NOTE — Telephone Encounter (Signed)
appt made with triage nurse for 04/19/19

## 2019-04-18 ENCOUNTER — Telehealth: Payer: Self-pay | Admitting: Family Medicine

## 2019-04-18 ENCOUNTER — Other Ambulatory Visit: Payer: Self-pay

## 2019-04-18 NOTE — Telephone Encounter (Signed)
Pt cleared for appt

## 2019-04-19 ENCOUNTER — Ambulatory Visit: Payer: Medicare HMO | Admitting: *Deleted

## 2019-04-19 ENCOUNTER — Other Ambulatory Visit: Payer: Self-pay | Admitting: Family Medicine

## 2019-04-19 DIAGNOSIS — I10 Essential (primary) hypertension: Secondary | ICD-10-CM

## 2019-04-19 DIAGNOSIS — J3089 Other allergic rhinitis: Secondary | ICD-10-CM | POA: Diagnosis not present

## 2019-04-19 DIAGNOSIS — J3081 Allergic rhinitis due to animal (cat) (dog) hair and dander: Secondary | ICD-10-CM | POA: Diagnosis not present

## 2019-04-19 DIAGNOSIS — J301 Allergic rhinitis due to pollen: Secondary | ICD-10-CM | POA: Diagnosis not present

## 2019-04-19 MED ORDER — METOPROLOL SUCCINATE ER 25 MG PO TB24: 25.0000 mg | ORAL_TABLET | Freq: Every day | ORAL | 1 refills | Status: DC

## 2019-04-19 NOTE — Progress Notes (Signed)
Pt here for BP check BP 181 87 P 98  On pt's machine BP 155 89 P 97  Per pt reading this AM at home  BP 139 74

## 2019-04-19 NOTE — Progress Notes (Signed)
She is on max dose CCB, ARB and Diuretic.  Add betablocker, Toprol XL 25mg .  Follow up in 4 weeks for BP check.

## 2019-04-26 DIAGNOSIS — J301 Allergic rhinitis due to pollen: Secondary | ICD-10-CM | POA: Diagnosis not present

## 2019-04-26 DIAGNOSIS — J3081 Allergic rhinitis due to animal (cat) (dog) hair and dander: Secondary | ICD-10-CM | POA: Diagnosis not present

## 2019-04-26 DIAGNOSIS — J3089 Other allergic rhinitis: Secondary | ICD-10-CM | POA: Diagnosis not present

## 2019-04-27 ENCOUNTER — Other Ambulatory Visit: Payer: Self-pay | Admitting: Family Medicine

## 2019-04-29 ENCOUNTER — Telehealth: Payer: Self-pay | Admitting: Family Medicine

## 2019-04-29 DIAGNOSIS — Z20828 Contact with and (suspected) exposure to other viral communicable diseases: Secondary | ICD-10-CM

## 2019-04-29 DIAGNOSIS — Z20822 Contact with and (suspected) exposure to covid-19: Secondary | ICD-10-CM

## 2019-04-29 NOTE — Telephone Encounter (Signed)
**  [place] After Hours/ Emergency Line Call**  Patient: Kristina Mclaughlin  PCP: Janora Norlander, DO  Endorsing loss of smell and headache x1 day.  Her husband was recently diagnosed with COVID19 and she would like testing.  No SOB, cough, fevers reported.   Mauro Arps M. Lajuana Ripple, DO

## 2019-05-02 ENCOUNTER — Other Ambulatory Visit: Payer: Self-pay

## 2019-05-02 DIAGNOSIS — Z20822 Contact with and (suspected) exposure to covid-19: Secondary | ICD-10-CM

## 2019-05-02 DIAGNOSIS — R6889 Other general symptoms and signs: Secondary | ICD-10-CM | POA: Diagnosis not present

## 2019-05-04 LAB — NOVEL CORONAVIRUS, NAA: SARS-CoV-2, NAA: DETECTED — AB

## 2019-05-05 ENCOUNTER — Ambulatory Visit (INDEPENDENT_AMBULATORY_CARE_PROVIDER_SITE_OTHER): Payer: Medicare HMO | Admitting: Family Medicine

## 2019-05-05 ENCOUNTER — Encounter: Payer: Self-pay | Admitting: Family Medicine

## 2019-05-05 ENCOUNTER — Telehealth: Payer: Self-pay | Admitting: *Deleted

## 2019-05-05 DIAGNOSIS — U071 COVID-19: Secondary | ICD-10-CM

## 2019-05-05 NOTE — Telephone Encounter (Signed)
Reviewed positive covid19 results with patient. Reviewed isolation end date and requirements to be met in order to d/c isolation. Stay home, wash hands frequently and disinfect common household areas often. She has fatigue, sinus congestion and cough at night mostly. Encouraged fluids and rest. Call primary physician with any shortness of breath. Stated she understood all discussed. [facility] notified by other.  

## 2019-05-05 NOTE — Progress Notes (Signed)
Virtual Visit via telephone Note Due to COVID-19 pandemic this visit was conducted virtually. This visit type was conducted due to national recommendations for restrictions regarding the COVID-19 Pandemic (e.g. social distancing, sheltering in place) in an effort to limit this patient's exposure and mitigate transmission in our community. All issues noted in this document were discussed and addressed.  A physical exam was not performed with this format.   I connected with Kristina Mclaughlin on 05/05/19 at 1150 by telephone and verified that I am speaking with the correct person using two identifiers. Kristina Mclaughlin is currently located at home and family is currently with them during visit. The provider, Monia Pouch, FNP is located in their office at time of visit.  I discussed the limitations, risks, security and privacy concerns of performing an evaluation and management service by telephone and the availability of in person appointments. I also discussed with the patient that there may be a patient responsible charge related to this service. The patient expressed understanding and agreed to proceed.  Subjective:  Patient ID: Kristina Mclaughlin, female    DOB: 05-16-1951, 68 y.o.   MRN: [id number]  Chief Complaint:  URI   HPI: Kristina Mclaughlin is a 68 y.o. female presenting on 05/05/2019 for URI   Known COVID-19 infection. Pt has been taking ASA, zinc, and antihistamines for symptom control. Pt states she continues to have a cough at night. She denies shortness of breath. States she has tried Mucinex but has not tried anything for the cough. She denies diarrhea.     Relevant past medical, surgical, family, and social history reviewed and updated as indicated.  Allergies and medications reviewed and updated.   Past Medical History:  Diagnosis Date  . Essential hypertension 10/29/2015  . Hypertension   . Insomnia     Past Surgical History:  Procedure Laterality Date  . ABDOMINAL  HYSTERECTOMY    . BACK SURGERY    . BREAST SURGERY     biopsy x2; reduction  . EXAMINATION UNDER ANESTHESIA  08/27/2012  . REDUCTION MAMMAPLASTY Bilateral 2002    Social History   Socioeconomic History  . Marital status: Married    Spouse name: Not on file  . Number of children: 2  . Years of education: Not on file  . Highest education level: Not on file  Occupational History  . Occupation: Garment/textile technologist Needs  . Financial resource strain: Not on file  . Food insecurity    Worry: Not on file    Inability: Not on file  . Transportation needs    Medical: Not on file    Non-medical: Not on file  Tobacco Use  . Smoking status: Never Smoker  . Smokeless tobacco: Never Used  Substance and Sexual Activity  . Alcohol use: No    Alcohol/week: 0.0 standard drinks  . Drug use: No  . Sexual activity: Not on file  Lifestyle  . Physical activity    Days per week: Not on file    Minutes per session: Not on file  . Stress: Not on file  Relationships  . Social Herbalist on phone: Not on file    Gets together: Not on file    Attends religious service: Not on file    Active member of club or organization: Not on file    Attends meetings of clubs or organizations: Not on file    Relationship status: Not on file  . Intimate partner  violence    Fear of current or ex partner: Not on file    Emotionally abused: Not on file    Physically abused: Not on file    Forced sexual activity: Not on file  Other Topics Concern  . 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  She tries to stay active.    Outpatient Encounter Medications as of 05/05/2019  Medication Sig  . albuterol (PROAIR HFA) 108 (90 Base) MCG/ACT inhaler INHALE 2 PUFFS INTO THE LUNGS EVERY 6 (SIX) HOURS AS NEEDED FOR WHEEZING OR SHORTNESS OF BREATH.  Marland Kitchen amLODipine (NORVASC) 10 MG tablet  Take 1 tablet (10 mg total) by mouth daily.  Marland Kitchen atorvastatin (LIPITOR) 40 MG tablet TAKE 1 TABLET BY MOUTH EVERY DAY  . BIOTIN PO Take 1 capsule by mouth daily.  . Calcium Carbonate-Vitamin D (CALCIUM + D PO) Take by mouth.  . fluticasone (FLONASE) 50 MCG/ACT nasal spray PLACE 2 SPRAYS INTO BOTH NOSTRILS DAILY.  Marland Kitchen Glucosamine-Chondroit-Vit C-Mn (GLUCOSAMINE 1500 COMPLEX PO) Take 1 tablet by mouth daily.  Marland Kitchen levocetirizine (XYZAL) 5 MG tablet TAKE 1 TABLET ONCE A DAY IN THE EVENING  . metoprolol succinate (TOPROL-XL) 25 MG 24 hr tablet Take 1 tablet (25 mg total) by mouth daily.  . Multiple Vitamin (MULTI-VITAMIN PO) Take 1 tablet by mouth daily.   Marland Kitchen omeprazole (PRILOSEC) 40 MG capsule Take 1 capsule (40 mg total) by mouth daily.  . Probiotic Product (PROBIOTIC DAILY PO) Take 1 tablet by mouth daily.   . ramelteon (ROZEREM) 8 MG tablet Take 1 tablet (8 mg total) by mouth at bedtime.  . traZODone (DESYREL) 150 MG tablet Take 1 tablet (150 mg total) by mouth at bedtime as needed for sleep.  . valsartan-hydrochlorothiazide (DIOVAN-HCT) 320-25 MG tablet Take 1 tablet by mouth daily.   No facility-administered encounter medications on file as of 05/05/2019.     Allergies  Allergen Reactions  . Avelox Abc [Moxifloxacin] Anaphylaxis  . Lovenox [Enoxaparin Sodium] Anaphylaxis  . Quinolones Hives and Shortness Of Breath  . Mucinex Sinus-Max [Phenylephrine-Apap-Guaifenesin] Swelling    Review of Systems  Constitutional: Positive for chills, fatigue and fever. Negative for activity change, appetite change, diaphoresis and unexpected weight change.  HENT: Positive for congestion, postnasal drip, rhinorrhea and sore throat.   Eyes: Negative.  Negative for photophobia and visual disturbance.  Respiratory: Positive for cough and wheezing. Negative for chest tightness and shortness of breath.   Cardiovascular: Negative for chest pain, palpitations and leg swelling.  Gastrointestinal: Negative for  abdominal pain, blood in stool, constipation, diarrhea, nausea and vomiting.  Endocrine: Negative.   Genitourinary: Negative for decreased urine volume, difficulty urinating, dysuria, frequency and urgency.  Musculoskeletal: Negative for arthralgias and myalgias.  Skin: Negative.  Negative for color change and pallor.  Allergic/Immunologic: Negative.   Neurological: Negative for dizziness, weakness and headaches.  Hematological: Negative.   Psychiatric/Behavioral: Negative for confusion, hallucinations, sleep disturbance and suicidal ideas.  All other systems reviewed and are negative.        Observations/Objective: No vital signs or physical exam, this was a telephone or virtual health encounter.  Pt alert and oriented, answers all questions appropriately, and able to speak in full sentences.    Assessment and Plan: Kristina Mclaughlin was seen today for uri.  Diagnoses and all orders for this visit:  COVID-19 virus infection Known COVID-19 infection. Has ongoing  cough and congestion. Symptomatic care discussed in detail. Stay adequately hydrated. Decongestants and antitussives as needed for cough. Coricidin HBP suggested. If symptoms persist or worsen, may need to be evaluated in the ED or UC.     Follow Up Instructions: Return if symptoms worsen or fail to improve.    I discussed the assessment and treatment plan with the patient. The patient was provided an opportunity to ask questions and all were answered. The patient agreed with the plan and demonstrated an understanding of the instructions.   The patient was advised to call back or seek an in-person evaluation if the symptoms worsen or if the condition fails to improve as anticipated.  The above assessment and management plan was discussed with the patient. The patient verbalized understanding of and has agreed to the management plan. Patient is aware to call the clinic if they develop any new symptoms or if symptoms persist or  worsen. Patient is aware when to return to the clinic for a follow-up visit. Patient educated on when it is appropriate to go to the emergency department.    I provided 15 minutes of non-face-to-face time during this encounter. The call started at 1150. The call ended at 1205. The other time was used for coordination of care.    Monia Pouch, FNP-C [facility] [address] [place], [place] [address] [phone] 05/05/19

## 2019-05-13 ENCOUNTER — Other Ambulatory Visit: Payer: Self-pay | Admitting: Family Medicine

## 2019-05-13 ENCOUNTER — Telehealth: Payer: Self-pay | Admitting: Family Medicine

## 2019-05-13 DIAGNOSIS — I10 Essential (primary) hypertension: Secondary | ICD-10-CM

## 2019-05-13 MED ORDER — GABAPENTIN 100 MG PO CAPS: 100.0000 mg | ORAL_CAPSULE | Freq: Every day | ORAL | 0 refills | Status: DC

## 2019-05-13 NOTE — Telephone Encounter (Signed)
If she is still symptomatic then she should continue to quarantine.  I recommend quarantining until she has been without symptoms for 3 consecutive days without medications.  I be glad to send in gabapentin for chronic cough though given that this is likely related to infection unsure if this will actually work.  Gabapentin is typically for chronic insidious cough.

## 2019-05-13 NOTE — Telephone Encounter (Signed)
Spoke to patient she states that she will continue self quarantine.  States that she is feeling better and that cough is worse at night.  Patient understands that Gabapentin may not work if related to infection but would still like to try as it has helped her in the past and she has had the cough since earlier in the year before she had COVID.

## 2019-05-13 NOTE — Telephone Encounter (Signed)
No problem.  It was already sent earlier and hopefully is ready for pick up

## 2019-05-13 NOTE — Telephone Encounter (Signed)
Patient notified. MPulliam, CMA/RT(R)  

## 2019-05-20 ENCOUNTER — Other Ambulatory Visit: Payer: Self-pay

## 2019-05-20 DIAGNOSIS — J3081 Allergic rhinitis due to animal (cat) (dog) hair and dander: Secondary | ICD-10-CM | POA: Diagnosis not present

## 2019-05-20 DIAGNOSIS — J301 Allergic rhinitis due to pollen: Secondary | ICD-10-CM | POA: Diagnosis not present

## 2019-05-20 DIAGNOSIS — J3089 Other allergic rhinitis: Secondary | ICD-10-CM | POA: Diagnosis not present

## 2019-05-23 ENCOUNTER — Ambulatory Visit (INDEPENDENT_AMBULATORY_CARE_PROVIDER_SITE_OTHER): Payer: Medicare HMO

## 2019-05-23 ENCOUNTER — Other Ambulatory Visit: Payer: Self-pay

## 2019-05-23 DIAGNOSIS — Z23 Encounter for immunization: Secondary | ICD-10-CM

## 2019-05-25 DIAGNOSIS — J3089 Other allergic rhinitis: Secondary | ICD-10-CM | POA: Diagnosis not present

## 2019-05-25 DIAGNOSIS — J301 Allergic rhinitis due to pollen: Secondary | ICD-10-CM | POA: Diagnosis not present

## 2019-05-25 DIAGNOSIS — J3081 Allergic rhinitis due to animal (cat) (dog) hair and dander: Secondary | ICD-10-CM | POA: Diagnosis not present

## 2019-06-07 ENCOUNTER — Ambulatory Visit
Admission: RE | Admit: 2019-06-07 | Discharge: 2019-06-07 | Disposition: A | Discharge status: Home / Self Care | Payer: Medicare HMO | Source: Ambulatory Visit | Attending: Family Medicine | Admitting: Family Medicine

## 2019-06-07 ENCOUNTER — Other Ambulatory Visit: Payer: Self-pay

## 2019-06-07 DIAGNOSIS — Z1231 Encounter for screening mammogram for malignant neoplasm of breast: Secondary | ICD-10-CM | POA: Diagnosis not present

## 2019-06-10 DIAGNOSIS — J301 Allergic rhinitis due to pollen: Secondary | ICD-10-CM | POA: Diagnosis not present

## 2019-06-10 DIAGNOSIS — J3089 Other allergic rhinitis: Secondary | ICD-10-CM | POA: Diagnosis not present

## 2019-06-15 DIAGNOSIS — J3089 Other allergic rhinitis: Secondary | ICD-10-CM | POA: Diagnosis not present

## 2019-06-15 DIAGNOSIS — J301 Allergic rhinitis due to pollen: Secondary | ICD-10-CM | POA: Diagnosis not present

## 2019-06-15 DIAGNOSIS — J3081 Allergic rhinitis due to animal (cat) (dog) hair and dander: Secondary | ICD-10-CM | POA: Diagnosis not present

## 2019-06-29 DIAGNOSIS — J3081 Allergic rhinitis due to animal (cat) (dog) hair and dander: Secondary | ICD-10-CM | POA: Diagnosis not present

## 2019-06-29 DIAGNOSIS — J3089 Other allergic rhinitis: Secondary | ICD-10-CM | POA: Diagnosis not present

## 2019-06-29 DIAGNOSIS — J301 Allergic rhinitis due to pollen: Secondary | ICD-10-CM | POA: Diagnosis not present

## 2019-07-14 DIAGNOSIS — J301 Allergic rhinitis due to pollen: Secondary | ICD-10-CM | POA: Diagnosis not present

## 2019-07-14 DIAGNOSIS — J3089 Other allergic rhinitis: Secondary | ICD-10-CM | POA: Diagnosis not present

## 2019-07-14 DIAGNOSIS — J3081 Allergic rhinitis due to animal (cat) (dog) hair and dander: Secondary | ICD-10-CM | POA: Diagnosis not present

## 2019-07-16 ENCOUNTER — Other Ambulatory Visit: Payer: Self-pay | Admitting: Family Medicine

## 2019-07-21 ENCOUNTER — Other Ambulatory Visit: Payer: Self-pay | Admitting: Family Medicine

## 2019-07-22 ENCOUNTER — Other Ambulatory Visit: Payer: Self-pay | Admitting: Family Medicine

## 2019-07-22 DIAGNOSIS — F5101 Primary insomnia: Secondary | ICD-10-CM

## 2019-07-23 ENCOUNTER — Other Ambulatory Visit: Payer: Self-pay | Admitting: Family Medicine

## 2019-07-27 DIAGNOSIS — J3089 Other allergic rhinitis: Secondary | ICD-10-CM | POA: Diagnosis not present

## 2019-07-27 DIAGNOSIS — J301 Allergic rhinitis due to pollen: Secondary | ICD-10-CM | POA: Diagnosis not present

## 2019-07-27 DIAGNOSIS — J3081 Allergic rhinitis due to animal (cat) (dog) hair and dander: Secondary | ICD-10-CM | POA: Diagnosis not present

## 2019-08-08 ENCOUNTER — Other Ambulatory Visit: Payer: Self-pay | Admitting: Family Medicine

## 2019-08-08 DIAGNOSIS — I10 Essential (primary) hypertension: Secondary | ICD-10-CM

## 2019-08-11 DIAGNOSIS — J301 Allergic rhinitis due to pollen: Secondary | ICD-10-CM | POA: Diagnosis not present

## 2019-08-11 DIAGNOSIS — J3089 Other allergic rhinitis: Secondary | ICD-10-CM | POA: Diagnosis not present

## 2019-08-11 DIAGNOSIS — J3081 Allergic rhinitis due to animal (cat) (dog) hair and dander: Secondary | ICD-10-CM | POA: Diagnosis not present

## 2019-08-24 DIAGNOSIS — J3089 Other allergic rhinitis: Secondary | ICD-10-CM | POA: Diagnosis not present

## 2019-08-24 DIAGNOSIS — J3081 Allergic rhinitis due to animal (cat) (dog) hair and dander: Secondary | ICD-10-CM | POA: Diagnosis not present

## 2019-08-24 DIAGNOSIS — J301 Allergic rhinitis due to pollen: Secondary | ICD-10-CM | POA: Diagnosis not present

## 2019-09-05 ENCOUNTER — Telehealth: Payer: Self-pay | Admitting: *Deleted

## 2019-09-05 ENCOUNTER — Ambulatory Visit (INDEPENDENT_AMBULATORY_CARE_PROVIDER_SITE_OTHER): Payer: Medicare HMO | Admitting: *Deleted

## 2019-09-05 ENCOUNTER — Other Ambulatory Visit: Payer: Self-pay

## 2019-09-05 DIAGNOSIS — Z23 Encounter for immunization: Secondary | ICD-10-CM

## 2019-09-05 NOTE — Telephone Encounter (Signed)
Pt request voltaren gel refill

## 2019-09-06 ENCOUNTER — Other Ambulatory Visit: Payer: Self-pay | Admitting: Family Medicine

## 2019-09-06 MED ORDER — DICLOFENAC SODIUM 1 % EX GEL: 4.0000 g | Freq: Four times a day (QID) | CUTANEOUS | 99 refills | Status: DC

## 2019-09-08 DIAGNOSIS — J3089 Other allergic rhinitis: Secondary | ICD-10-CM | POA: Diagnosis not present

## 2019-09-08 DIAGNOSIS — J301 Allergic rhinitis due to pollen: Secondary | ICD-10-CM | POA: Diagnosis not present

## 2019-09-08 DIAGNOSIS — J3081 Allergic rhinitis due to animal (cat) (dog) hair and dander: Secondary | ICD-10-CM | POA: Diagnosis not present

## 2019-09-19 DIAGNOSIS — J3089 Other allergic rhinitis: Secondary | ICD-10-CM | POA: Diagnosis not present

## 2019-09-19 DIAGNOSIS — J301 Allergic rhinitis due to pollen: Secondary | ICD-10-CM | POA: Diagnosis not present

## 2019-09-19 DIAGNOSIS — J3081 Allergic rhinitis due to animal (cat) (dog) hair and dander: Secondary | ICD-10-CM | POA: Diagnosis not present

## 2019-09-21 ENCOUNTER — Telehealth: Payer: Self-pay | Admitting: Family Medicine

## 2019-09-21 NOTE — Telephone Encounter (Signed)
Advised patient to wait 45 days between shingles vaccine and covid vaccine

## 2019-10-03 DIAGNOSIS — J3081 Allergic rhinitis due to animal (cat) (dog) hair and dander: Secondary | ICD-10-CM | POA: Diagnosis not present

## 2019-10-03 DIAGNOSIS — J3089 Other allergic rhinitis: Secondary | ICD-10-CM | POA: Diagnosis not present

## 2019-10-03 DIAGNOSIS — J301 Allergic rhinitis due to pollen: Secondary | ICD-10-CM | POA: Diagnosis not present

## 2019-10-16 ENCOUNTER — Other Ambulatory Visit: Payer: Self-pay | Admitting: Family Medicine

## 2019-10-17 DIAGNOSIS — G894 Chronic pain syndrome: Secondary | ICD-10-CM | POA: Diagnosis not present

## 2019-10-17 DIAGNOSIS — R69 Illness, unspecified: Secondary | ICD-10-CM | POA: Diagnosis not present

## 2019-10-17 DIAGNOSIS — E11621 Type 2 diabetes mellitus with foot ulcer: Secondary | ICD-10-CM | POA: Diagnosis not present

## 2019-10-20 DIAGNOSIS — J3081 Allergic rhinitis due to animal (cat) (dog) hair and dander: Secondary | ICD-10-CM | POA: Diagnosis not present

## 2019-10-20 DIAGNOSIS — J45991 Cough variant asthma: Secondary | ICD-10-CM | POA: Diagnosis not present

## 2019-10-20 DIAGNOSIS — J301 Allergic rhinitis due to pollen: Secondary | ICD-10-CM | POA: Diagnosis not present

## 2019-10-20 DIAGNOSIS — J3089 Other allergic rhinitis: Secondary | ICD-10-CM | POA: Diagnosis not present

## 2019-10-27 ENCOUNTER — Other Ambulatory Visit: Payer: Self-pay | Admitting: Family Medicine

## 2019-11-03 ENCOUNTER — Other Ambulatory Visit: Payer: Self-pay | Admitting: Family Medicine

## 2019-11-03 DIAGNOSIS — J301 Allergic rhinitis due to pollen: Secondary | ICD-10-CM | POA: Diagnosis not present

## 2019-11-03 DIAGNOSIS — J3089 Other allergic rhinitis: Secondary | ICD-10-CM | POA: Diagnosis not present

## 2019-11-03 DIAGNOSIS — I10 Essential (primary) hypertension: Secondary | ICD-10-CM

## 2019-11-03 DIAGNOSIS — J3081 Allergic rhinitis due to animal (cat) (dog) hair and dander: Secondary | ICD-10-CM | POA: Diagnosis not present

## 2019-11-08 ENCOUNTER — Ambulatory Visit: Payer: Medicare HMO

## 2019-11-09 ENCOUNTER — Other Ambulatory Visit: Payer: Self-pay | Admitting: Family Medicine

## 2019-11-18 DIAGNOSIS — J3089 Other allergic rhinitis: Secondary | ICD-10-CM | POA: Diagnosis not present

## 2019-11-18 DIAGNOSIS — J301 Allergic rhinitis due to pollen: Secondary | ICD-10-CM | POA: Diagnosis not present

## 2019-11-18 DIAGNOSIS — J3081 Allergic rhinitis due to animal (cat) (dog) hair and dander: Secondary | ICD-10-CM | POA: Diagnosis not present

## 2019-11-22 ENCOUNTER — Other Ambulatory Visit: Payer: Self-pay

## 2019-11-22 ENCOUNTER — Encounter: Payer: Self-pay | Admitting: Family Medicine

## 2019-11-22 ENCOUNTER — Ambulatory Visit (INDEPENDENT_AMBULATORY_CARE_PROVIDER_SITE_OTHER): Payer: Medicare HMO | Admitting: Family Medicine

## 2019-11-22 VITALS — BP 137/75 | HR 75 | Temp 97.5°F | Ht 69.0 in | Wt 245.0 lb

## 2019-11-22 DIAGNOSIS — R69 Illness, unspecified: Secondary | ICD-10-CM | POA: Diagnosis not present

## 2019-11-22 DIAGNOSIS — R739 Hyperglycemia, unspecified: Secondary | ICD-10-CM

## 2019-11-22 DIAGNOSIS — I10 Essential (primary) hypertension: Secondary | ICD-10-CM

## 2019-11-22 DIAGNOSIS — Z0000 Encounter for general adult medical examination without abnormal findings: Secondary | ICD-10-CM

## 2019-11-22 DIAGNOSIS — E781 Pure hyperglyceridemia: Secondary | ICD-10-CM | POA: Diagnosis not present

## 2019-11-22 DIAGNOSIS — F5101 Primary insomnia: Secondary | ICD-10-CM | POA: Diagnosis not present

## 2019-11-22 DIAGNOSIS — Z130 Encounter for screening for diseases of the blood and blood-forming organs and certain disorders involving the immune mechanism: Secondary | ICD-10-CM

## 2019-11-22 DIAGNOSIS — Z0001 Encounter for general adult medical examination with abnormal findings: Secondary | ICD-10-CM | POA: Diagnosis not present

## 2019-11-22 DIAGNOSIS — D179 Benign lipomatous neoplasm, unspecified: Secondary | ICD-10-CM | POA: Diagnosis not present

## 2019-11-22 LAB — BAYER DCA HB A1C WAIVED: HB A1C (BAYER DCA - WAIVED): 6.4 % (ref <7.0)

## 2019-11-22 MED ORDER — METOPROLOL SUCCINATE ER 25 MG PO TB24: 25.0000 mg | ORAL_TABLET | Freq: Every day | ORAL | 2 refills | Status: DC

## 2019-11-22 MED ORDER — OMEPRAZOLE 40 MG PO CPDR: 40.0000 mg | DELAYED_RELEASE_CAPSULE | Freq: Every day | ORAL | 3 refills | Status: DC

## 2019-11-22 MED ORDER — VALSARTAN-HYDROCHLOROTHIAZIDE 320-25 MG PO TABS: 1.0000 | ORAL_TABLET | Freq: Every day | ORAL | 3 refills | Status: DC

## 2019-11-22 MED ORDER — TRAZODONE HCL 150 MG PO TABS: 150.0000 mg | ORAL_TABLET | Freq: Every evening | ORAL | 3 refills | Status: DC | PRN

## 2019-11-22 MED ORDER — ALBUTEROL SULFATE HFA 108 (90 BASE) MCG/ACT IN AERS: INHALATION_SPRAY | RESPIRATORY_TRACT | 2 refills | Status: DC

## 2019-11-22 MED ORDER — ATORVASTATIN CALCIUM 40 MG PO TABS: 40.0000 mg | ORAL_TABLET | Freq: Every day | ORAL | 3 refills | Status: DC

## 2019-11-22 NOTE — Patient Instructions (Signed)
You had labs performed today.  You will be contacted with the results of the labs once they are available, usually in the next 3 business days for routine lab work.  If you have an active my chart account, they will be released to your MyChart.  If you prefer to have these labs released to you via telephone, please let us know.  If you had a pap smear or biopsy performed, expect to be contacted in about 7-10 days.   Preventive Care 65 Years and Older, Female Preventive care refers to lifestyle choices and visits with your health care provider that can promote health and wellness. This includes:  A yearly physical exam. This is also called an annual well check.  Regular dental and eye exams.  Immunizations.  Screening for certain conditions.  Healthy lifestyle choices, such as diet and exercise. What can I expect for my preventive care visit? Physical exam Your health care provider will check:  Height and weight. These may be used to calculate body mass index (BMI), which is a measurement that tells if you are at a healthy weight.  Heart rate and blood pressure.  Your skin for abnormal spots. Counseling Your health care provider may ask you questions about:  Alcohol, tobacco, and drug use.  Emotional well-being.  Home and relationship well-being.  Sexual activity.  Eating habits.  History of falls.  Memory and ability to understand (cognition).  Work and work environment.  Pregnancy and menstrual history. What immunizations do I need?  Influenza (flu) vaccine  This is recommended every year. Tetanus, diphtheria, and pertussis (Tdap) vaccine  You may need a Td booster every 10 years. Varicella (chickenpox) vaccine  You may need this vaccine if you have not already been vaccinated. Zoster (shingles) vaccine  You may need this after age 60. Pneumococcal conjugate (PCV13) vaccine  One dose is recommended after age 65. Pneumococcal polysaccharide (PPSV23)  vaccine  One dose is recommended after age 65. Measles, mumps, and rubella (MMR) vaccine  You may need at least one dose of MMR if you were born in 1957 or later. You may also need a second dose. Meningococcal conjugate (MenACWY) vaccine  You may need this if you have certain conditions. Hepatitis A vaccine  You may need this if you have certain conditions or if you travel or work in places where you may be exposed to hepatitis A. Hepatitis B vaccine  You may need this if you have certain conditions or if you travel or work in places where you may be exposed to hepatitis B. Haemophilus influenzae type b (Hib) vaccine  You may need this if you have certain conditions. You may receive vaccines as individual doses or as more than one vaccine together in one shot (combination vaccines). Talk with your health care provider about the risks and benefits of combination vaccines. What tests do I need? Blood tests  Lipid and cholesterol levels. These may be checked every 5 years, or more frequently depending on your overall health.  Hepatitis C test.  Hepatitis B test. Screening  Lung cancer screening. You may have this screening every year starting at age 55 if you have a 30-pack-year history of smoking and currently smoke or have quit within the past 15 years.  Colorectal cancer screening. All adults should have this screening starting at age 50 and continuing until age 75. Your health care provider may recommend screening at age 45 if you are at increased risk. You will have tests every 1-10   years, depending on your results and the type of screening test.  Diabetes screening. This is done by checking your blood sugar (glucose) after you have not eaten for a while (fasting). You may have this done every 1-3 years.  Mammogram. This may be done every 1-2 years. Talk with your health care provider about how often you should have regular mammograms.  BRCA-related cancer screening. This may  be done if you have a family history of breast, ovarian, tubal, or peritoneal cancers. Other tests  Sexually transmitted disease (STD) testing.  Bone density scan. This is done to screen for osteoporosis. You may have this done starting at age 65. Follow these instructions at home: Eating and drinking  Eat a diet that includes fresh fruits and vegetables, whole grains, lean protein, and low-fat dairy products. Limit your intake of foods with high amounts of sugar, saturated fats, and salt.  Take vitamin and mineral supplements as recommended by your health care provider.  Do not drink alcohol if your health care provider tells you not to drink.  If you drink alcohol: ? Limit how much you have to 0-1 drink a day. ? Be aware of how much alcohol is in your drink. In the [place] one drink equals one 12 oz bottle of beer (355 mL), one 5 oz glass of wine (148 mL), or one 1 oz glass of hard liquor (44 mL). Lifestyle  Take daily care of your teeth and gums.  Stay active. Exercise for at least 30 minutes on 5 or more days each week.  Do not use any products that contain nicotine or tobacco, such as cigarettes, e-cigarettes, and chewing tobacco. If you need help quitting, ask your health care provider.  If you are sexually active, practice safe sex. Use a condom or other form of protection in order to prevent STIs (sexually transmitted infections).  Talk with your health care provider about taking a low-dose aspirin or statin. What's next?  Go to your health care provider once a year for a well check visit.  Ask your health care provider how often you should have your eyes and teeth checked.  Stay up to date on all vaccines. This information is not intended to replace advice given to you by your health care provider. Make sure you discuss any questions you have with your health care provider. Document Revised: 07/15/2018 Document Reviewed: 07/15/2018 Elsevier Patient Education  2020  [employer]  

## 2019-11-22 NOTE — Progress Notes (Signed)
Kristina Mclaughlin is a 68 y.o. female presents to office today for annual physical exam examination.    Concerns today include: 1.  Low sex drive Patient reports that she has a low sex drive.  Her husband is interested but has physical issues that make it difficult for them to have intercourse.  She does not report any abnormal vaginal pain or discharge.  2.  Lipomas Patient reports various lipomas along the thighs, abdomen and ankle.  She had 1 of these removed from the right inner knee by Dr. Denna Haggard.  She would like to have these others removed.  She would like referral to surgery to have these looked at.  Was previously seen at [facility] in [place] but does not wish to return there as the surgeon that she like has since retired.  Marital status: married, Substance use: none Diet: fair, Exercise: started new exercise regimen 4-5 days per week at gym Last eye exam: UTD Last dental exam: UTD Last colonoscopy: UTD 2015, Dr Michail Sermon Last mammogram: UTD Refills needed today: all Immunizations needed: UTD  Past Medical History:  Diagnosis Date  . 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 Essential hypertension 10/29/2015  . Hypertension   . Insomnia    Social History   Socioeconomic History  . Marital status: Married    Spouse name: Not on file  . Number of children: 2  . Years of education: Not on file  . Highest education level: Not on file  Occupational History  . Occupation: Geologist, engineering   Tobacco Use  . Smoking status: Never Smoker  . Smokeless tobacco: Never Used  Substance and Sexual Activity  . Alcohol use: No    Alcohol/week: 0.0 standard drinks  . Drug use: No  . Sexual activity: Not on file  Other Topics Concern  . 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  She tries to stay active.   Social  Determinants of Health   Financial Resource Strain:   . Difficulty of Paying Living Expenses:   Food Insecurity:   . Worried About Charity fundraiser in the Last Year:   . Arboriculturist in the Last Year:   Transportation Needs:   . Film/video editor (Medical):   Marland Kitchen Lack of Transportation (Non-Medical):   Physical Activity:   . Days of Exercise per Week:   . Minutes of Exercise per Session:   Stress:   . Feeling of Stress :   Social Connections:   . Frequency of Communication with Friends and Family:   . Frequency of Social Gatherings with Friends and Family:   . Attends Religious Services:   . Active Member of Clubs or Organizations:   . Attends Archivist Meetings:   Marland Kitchen Marital Status:   Intimate Partner Violence:   . Fear of Current or Ex-Partner:   . Emotionally Abused:   Marland Kitchen Physically Abused:   . Sexually Abused:    Past Surgical History:  Procedure Laterality Date  . ABDOMINAL HYSTERECTOMY    . BACK SURGERY    . BREAST SURGERY     biopsy x2; reduction  . EXAMINATION UNDER ANESTHESIA  08/27/2012  . REDUCTION MAMMAPLASTY Bilateral 2002   Family History  Problem Relation Age of Onset  . Bladder Cancer Mother   . Allergies Mother   .  Heart disease Mother   . Allergies Sister   . Alzheimer's disease Father   . Breast cancer Neg Hx     Current Outpatient Medications:  .  albuterol (PROAIR HFA) 108 (90 Base) MCG/ACT inhaler, INHALE 2 PUFFS INTO THE LUNGS EVERY 6 (SIX) HOURS AS NEEDED FOR WHEEZING OR SHORTNESS OF BREATH., Disp: 8.5 Inhaler, Rfl: 2 .  amLODipine (NORVASC) 10 MG tablet, TAKE 1 TABLET BY MOUTH EVERY DAY, Disp: 90 tablet, Rfl: 0 .  atorvastatin (LIPITOR) 40 MG tablet, TAKE 1 TABLET (40 MG TOTAL) BY MOUTH DAILY. (NEEDS TO BE SEEN BEFORE NEXT REFILL), Disp: 30 tablet, Rfl: 0 .  BIOTIN PO, Take 1 capsule by mouth daily., Disp: , Rfl:  .  Calcium Carbonate-Vitamin D (CALCIUM + D PO), Take by mouth., Disp: , Rfl:  .  diclofenac Sodium (VOLTAREN)  1 % GEL, Apply 4 g topically 4 (four) times daily., Disp: 200 g, Rfl: prn .  fluticasone (FLONASE) 50 MCG/ACT nasal spray, PLACE 2 SPRAYS INTO BOTH NOSTRILS DAILY., Disp: 16 g, Rfl: 0 .  gabapentin (NEURONTIN) 100 MG capsule, Take 1 capsule (100 mg total) by mouth at bedtime., Disp: 30 capsule, Rfl: 0 .  Glucosamine-Chondroit-Vit C-Mn (GLUCOSAMINE 1500 COMPLEX PO), Take 1 tablet by mouth daily., Disp: , Rfl:  .  levocetirizine (XYZAL) 5 MG tablet, TAKE 1 TABLET ONCE A DAY IN THE EVENING, Disp: , Rfl: 3 .  metoprolol succinate (TOPROL-XL) 25 MG 24 hr tablet, TAKE 1 TABLET BY MOUTH EVERY DAY, Disp: 90 tablet, Rfl: 0 .  Multiple Vitamin (MULTI-VITAMIN PO), Take 1 tablet by mouth daily. , Disp: , Rfl:  .  omeprazole (PRILOSEC) 40 MG capsule, TAKE 1 CAPSULE BY MOUTH EVERY DAY, Disp: 90 capsule, Rfl: 0 .  Probiotic Product (PROBIOTIC DAILY PO), Take 1 tablet by mouth daily. , Disp: , Rfl:  .  ramelteon (ROZEREM) 8 MG tablet, Take 1 tablet (8 mg total) by mouth at bedtime., Disp: 30 tablet, Rfl: 2 .  traZODone (DESYREL) 150 MG tablet, TAKE 1 TABLET (150 MG TOTAL) BY MOUTH AT BEDTIME AS NEEDED FOR SLEEP., Disp: 90 tablet, Rfl: 1 .  valsartan-hydrochlorothiazide (DIOVAN-HCT) 320-25 MG tablet, TAKE 1 TABLET BY MOUTH EVERY DAY, Disp: 90 tablet, Rfl: 3  Allergies  Allergen Reactions  . Avelox Abc [Moxifloxacin] Anaphylaxis  . Lovenox [Enoxaparin Sodium] Anaphylaxis  . Quinolones Hives and Shortness Of Breath  . Mucinex Sinus-Max [Phenylephrine-Apap-Guaifenesin] Swelling     ROS: Review of Systems Pertinent items noted in HPI and remainder of comprehensive ROS otherwise negative.    Physical exam BP 137/75   Pulse 75   Temp (!) 97.5 F (36.4 C) (Temporal)   Ht '5\' 9"'  (1.753 m)   Wt 245 lb (111.1 kg)   SpO2 98%   BMI 36.18 kg/m  General appearance: alert, cooperative, appears stated age and no distress Head: Normocephalic, without obvious abnormality, atraumatic Eyes: negative findings: lids  and lashes normal, conjunctivae and sclerae normal, corneas clear and pupils equal, round, reactive to light and accomodation Ears: normal TM's and external ear canals both ears Nose: Nares normal. Septum midline. Mucosa normal. No drainage or sinus tenderness. Throat: lips, mucosa, and tongue normal; teeth and gums normal Neck: no adenopathy, no carotid bruit, supple, symmetrical, trachea midline and thyroid not enlarged, symmetric, no tenderness/mass/nodules Back: symmetric, no curvature. ROM normal. No CVA tenderness. Lungs: clear to auscultation bilaterally; no wheezes, rhonchi or rales.;  Normal work of breathing on room air Heart: regular rate and rhythm, S1, S2  normal, no murmur, click, rub or gallop Abdomen: Soft, nontender, nondistended.  She has several palpable soft tissue masses noted within the upper abdomen.  One on the right upper quadrant that is approximately gumball sized, 1 just over the xiphoid process that is approximately marble sized and one in the left upper quadrant that again is marble sized.  These seem rubbery in texture and are mobile.  No visible punctum within the skin. Extremities: extremities normal, atraumatic, no cyanosis or edema; similar gumball sized rubbery soft tissue mass noted along the right lateral ankle Pulses: 2+ and symmetric Skin: Skin color, texture, turgor normal. No rashes or lesions Lymph nodes: Cervical, supraclavicular, and axillary nodes normal. Neurologic: Alert and oriented X 3, normal strength and tone. Normal symmetric reflexes. Normal coordination and gait Psych: Mood stable, speech normal, affect appropriate, pleasant and interactive   Assessment/ Plan: Sibbie Flammia Daudelin here for annual physical exam.   1. Annual physical exam  2. Essential hypertension Controlled.  Continue current regimen - CMP14+EGFR  3. Primary insomnia Stable. - TSH - traZODone (DESYREL) 150 MG tablet; Take 1 tablet (150 mg total) by mouth at  bedtime as needed for sleep.  Dispense: 90 tablet; Refill: 3  4. Hypertriglyceridemia Check nonfasting labs - CMP14+EGFR - Lipid Panel - TSH  5. Elevated serum glucose - Bayer DCA Hb A1c Waived  6. Screening, anemia, deficiency, iron - CBC  7. Multiple lipomas Referral placed to general surgery - Ambulatory referral to General Surgery  Meds ordered this encounter  Medications  . albuterol (PROAIR HFA) 108 (90 Base) MCG/ACT inhaler    Sig: INHALE 2 PUFFS INTO THE LUNGS EVERY 6 (SIX) HOURS AS NEEDED FOR WHEEZING OR SHORTNESS OF BREATH.    Dispense:  8 g    Refill:  2  . atorvastatin (LIPITOR) 40 MG tablet    Sig: Take 1 tablet (40 mg total) by mouth daily.    Dispense:  90 tablet    Refill:  3  . metoprolol succinate (TOPROL-XL) 25 MG 24 hr tablet    Sig: Take 1 tablet (25 mg total) by mouth daily.    Dispense:  90 tablet    Refill:  2  . omeprazole (PRILOSEC) 40 MG capsule    Sig: Take 1 capsule (40 mg total) by mouth daily.    Dispense:  90 capsule    Refill:  3  . valsartan-hydrochlorothiazide (DIOVAN-HCT) 320-25 MG tablet    Sig: Take 1 tablet by mouth daily.    Dispense:  90 tablet    Refill:  3  . traZODone (DESYREL) 150 MG tablet    Sig: Take 1 tablet (150 mg total) by mouth at bedtime as needed for sleep.    Dispense:  90 tablet    Refill:  3    Counseled on healthy lifestyle choices, including diet (rich in fruits, vegetables and lean meats and low in salt and simple carbohydrates) and exercise (at least 30 minutes of moderate physical activity daily).  Patient to follow up in 1 year for annual exam or sooner if needed.  Chasya Zenz M. Lajuana Ripple, DO

## 2019-11-23 LAB — CMP14+EGFR
ALT: 29 IU/L (ref 0–32)
AST: 18 IU/L (ref 0–40)
Albumin/Globulin Ratio: 2 (ref 1.2–2.2)
Albumin: 4.3 g/dL (ref 3.8–4.8)
Alkaline Phosphatase: 98 IU/L (ref 39–117)
BUN/Creatinine Ratio: 13 (ref 12–28)
BUN: 10 mg/dL (ref 8–27)
Bilirubin Total: 0.3 mg/dL (ref 0.0–1.2)
CO2: 23 mmol/L (ref 20–29)
Calcium: 9.5 mg/dL (ref 8.7–10.3)
Chloride: 101 mmol/L (ref 96–106)
Creatinine, Ser: 0.8 mg/dL (ref 0.57–1.00)
GFR calc Af Amer: 89 mL/min/{1.73_m2} (ref >59)
GFR calc non Af Amer: 77 mL/min/{1.73_m2} (ref >59)
Globulin, Total: 2.1 g/dL (ref 1.5–4.5)
Glucose: 184 mg/dL — ABNORMAL HIGH (ref 65–99)
Potassium: 3.8 mmol/L (ref 3.5–5.2)
Sodium: 139 mmol/L (ref 134–144)
Total Protein: 6.4 g/dL (ref 6.0–8.5)

## 2019-11-23 LAB — CBC
Hematocrit: 43.8 % (ref 34.0–46.6)
Hemoglobin: 15.2 g/dL (ref 11.1–15.9)
MCH: 30.2 pg (ref 26.6–33.0)
MCHC: 34.7 g/dL (ref 31.5–35.7)
MCV: 87 fL (ref 79–97)
Platelets: 207 10*3/uL (ref 150–450)
RBC: 5.04 x10E6/uL (ref 3.77–5.28)
RDW: 13 % (ref 11.7–15.4)
WBC: 6.7 10*3/uL (ref 3.4–10.8)

## 2019-11-23 LAB — LIPID PANEL
Chol/HDL Ratio: 3.6 ratio (ref 0.0–4.4)
Cholesterol, Total: 141 mg/dL (ref 100–199)
HDL: 39 mg/dL — ABNORMAL LOW (ref >39)
LDL Chol Calc (NIH): 74 mg/dL (ref 0–99)
Triglycerides: 163 mg/dL — ABNORMAL HIGH (ref 0–149)
VLDL Cholesterol Cal: 28 mg/dL (ref 5–40)

## 2019-11-23 LAB — TSH: TSH: 3.38 u[IU]/mL (ref 0.450–4.500)

## 2019-11-25 ENCOUNTER — Telehealth: Payer: Self-pay | Admitting: Family Medicine

## 2019-11-25 NOTE — Telephone Encounter (Signed)
Pt wants to know if there is anything Dr Lajuana Ripple can recommend for her to take OTC or prescribe to patient for frequent urge to use the bathroom throughout the night.

## 2019-11-25 NOTE — Telephone Encounter (Signed)
Patient has a follow up appointment scheduled. 

## 2019-11-28 ENCOUNTER — Ambulatory Visit (INDEPENDENT_AMBULATORY_CARE_PROVIDER_SITE_OTHER): Payer: Medicare HMO

## 2019-11-28 ENCOUNTER — Other Ambulatory Visit: Payer: Self-pay

## 2019-11-28 DIAGNOSIS — Z23 Encounter for immunization: Secondary | ICD-10-CM

## 2019-11-29 ENCOUNTER — Ambulatory Visit (INDEPENDENT_AMBULATORY_CARE_PROVIDER_SITE_OTHER): Payer: Medicare HMO | Admitting: Family Medicine

## 2019-11-29 ENCOUNTER — Encounter: Payer: Self-pay | Admitting: Family Medicine

## 2019-11-29 VITALS — BP 129/76 | HR 71 | Temp 97.6°F | Ht 69.0 in | Wt 246.0 lb

## 2019-11-29 DIAGNOSIS — R350 Frequency of micturition: Secondary | ICD-10-CM

## 2019-11-29 DIAGNOSIS — J329 Chronic sinusitis, unspecified: Secondary | ICD-10-CM

## 2019-11-29 DIAGNOSIS — N3281 Overactive bladder: Secondary | ICD-10-CM

## 2019-11-29 DIAGNOSIS — J310 Chronic rhinitis: Secondary | ICD-10-CM

## 2019-11-29 LAB — URINALYSIS, COMPLETE
Bilirubin, UA: NEGATIVE
Glucose, UA: NEGATIVE
Ketones, UA: NEGATIVE
Nitrite, UA: NEGATIVE
Protein,UA: NEGATIVE
Specific Gravity, UA: 1.015 (ref 1.005–1.030)
Urobilinogen, Ur: 0.2 mg/dL (ref 0.2–1.0)
pH, UA: 6.5 (ref 5.0–7.5)

## 2019-11-29 LAB — MICROSCOPIC EXAMINATION

## 2019-11-29 MED ORDER — MAGIC MOUTHWASH W/LIDOCAINE: ORAL | 0 refills | Status: DC

## 2019-11-29 MED ORDER — MIRABEGRON ER 25 MG PO TB24: 25.0000 mg | ORAL_TABLET | Freq: Every day | ORAL | 2 refills | Status: DC

## 2019-11-29 MED ORDER — CEFDINIR 300 MG PO CAPS: 300.0000 mg | ORAL_CAPSULE | Freq: Two times a day (BID) | ORAL | 0 refills | Status: DC

## 2019-11-29 NOTE — Progress Notes (Signed)
Subjective: CC: Overactive bladder PCP: Janora Norlander, DO [id number] Kristina Mclaughlin is a 69 y.o. female presenting to clinic today for:  1.  Overactive bladder Patient reports she has had several month history of 4-5 times a night urination.  She denies any dysuria, hematuria, bladder pain.   2.  Sore throat, headache Patient reports onset of sore throat with spots to the back of her throat, headache and myalgia about 4 to 5 days ago.  She has had both of her Covid vaccinations but the symptoms onset before the 2-week period after second vaccination.  Does not report any fevers, new cough, hemoptysis.  She uses her allergy medicines as prescribed.  Symptoms seem to be worse at nighttime.   ROS: Per HPI  Allergies  Allergen Reactions  . Avelox Abc [Moxifloxacin] Anaphylaxis  . Lovenox [Enoxaparin Sodium] Anaphylaxis  . Quinolones Hives and Shortness Of Breath  . Mucinex Sinus-Max [Phenylephrine-Apap-Guaifenesin] Swelling   Past Medical History:  Diagnosis Date  . 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 Essential hypertension 10/29/2015  . Hypertension   . Insomnia     Current Outpatient Medications:  .  albuterol (PROAIR HFA) 108 (90 Base) MCG/ACT inhaler, INHALE 2 PUFFS INTO THE LUNGS EVERY 6 (SIX) HOURS AS NEEDED FOR WHEEZING OR SHORTNESS OF BREATH., Disp: 8 g, Rfl: 2 .  atorvastatin (LIPITOR) 40 MG tablet, Take 1 tablet (40 mg total) by mouth daily., Disp: 90 tablet, Rfl: 3 .  BIOTIN PO, Take 1 capsule by mouth daily., Disp: , Rfl:  .  calcium carbonate (OSCAL) 1500 (600 Ca) MG TABS tablet, Take by mouth., Disp: , Rfl:  .  ciclopirox (PENLAC) 8 % solution, ciclopirox 8 % topical solution  APPLY TO AFFECTED NAILS ONCE DAILY, Disp: , Rfl:  .  diclofenac Sodium (VOLTAREN) 1 % GEL, Apply 4 g topically 4 (four) times daily., Disp: 200 g, Rfl: prn .  EPINEPHrine 0.3 mg/0.3 mL IJ SOAJ injection, See admin instructions., Disp: , Rfl:  .   fluticasone (FLONASE) 50 MCG/ACT nasal spray, PLACE 2 SPRAYS INTO BOTH NOSTRILS DAILY., Disp: 16 g, Rfl: 0 .  levocetirizine (XYZAL) 5 MG tablet, TAKE 1 TABLET ONCE A DAY IN THE EVENING, Disp: , Rfl: 3 .  metoprolol succinate (TOPROL-XL) 25 MG 24 hr tablet, Take 1 tablet (25 mg total) by mouth daily., Disp: 90 tablet, Rfl: 2 .  Multiple Vitamin (MULTI-VITAMIN PO), Take 1 tablet by mouth daily. , Disp: , Rfl:  .  omeprazole (PRILOSEC) 40 MG capsule, Take 1 capsule (40 mg total) by mouth daily., Disp: 90 capsule, Rfl: 3 .  Probiotic Product (PROBIOTIC DAILY PO), Take 1 tablet by mouth daily. , Disp: , Rfl:  .  psyllium (METAMUCIL) 58.6 % packet, Take by mouth., Disp: , Rfl:  .  SYMBICORT 80-4.5 MCG/ACT inhaler, SMARTSIG:2 Puff(s) By Mouth Twice Daily, Disp: , Rfl:  .  traZODone (DESYREL) 150 MG tablet, Take 1 tablet (150 mg total) by mouth at bedtime as needed for sleep., Disp: 90 tablet, Rfl: 3 .  valsartan-hydrochlorothiazide (DIOVAN-HCT) 320-25 MG tablet, Take 1 tablet by mouth daily., Disp: 90 tablet, Rfl: 3 Social History   Socioeconomic History  . Marital status: Married    Spouse name: Not on file  . Number of children: 2  . Years of education: Not on file  . Highest education level: Not on file  Occupational History  . Occupation: Geologist, engineering   Tobacco Use  . Smoking status:  Never Smoker  . Smokeless tobacco: Never Used  Substance and Sexual Activity  . Alcohol use: No    Alcohol/week: 0.0 standard drinks  . Drug use: No  . Sexual activity: Not on file  Other Topics Concern  . 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  She tries to stay active.   Social Determinants of Health   Financial Resource Strain:   . Difficulty of Paying Living Expenses:   Food Insecurity:   . Worried About Charity fundraiser in the Last Year:   . Arboriculturist in the Last  Year:   Transportation Needs:   . Film/video editor (Medical):   Marland Kitchen Lack of Transportation (Non-Medical):   Physical Activity:   . Days of Exercise per Week:   . Minutes of Exercise per Session:   Stress:   . Feeling of Stress :   Social Connections:   . Frequency of Communication with Friends and Family:   . Frequency of Social Gatherings with Friends and Family:   . Attends Religious Services:   . Active Member of Clubs or Organizations:   . Attends Archivist Meetings:   Marland Kitchen Marital Status:   Intimate Partner Violence:   . Fear of Current or Ex-Partner:   . Emotionally Abused:   Marland Kitchen Physically Abused:   . Sexually Abused:    Family History  Problem Relation Age of Onset  . Bladder Cancer Mother   . Allergies Mother   . Heart disease Mother   . Allergies Sister   . Alzheimer's disease Father   . Breast cancer Neg Hx     Objective: Office vital signs reviewed. BP 129/76   Pulse 71   Temp 97.6 F (36.4 C)   Ht 5\' 9"  (1.753 m)   Wt 246 lb (111.6 kg)   SpO2 94%   BMI 36.33 kg/m   Physical Examination:  General: Awake, alert, well nourished, No acute distress HEENT: Normal    Neck: No masses palpated. No lymphadenopathy    Ears: Tympanic membranes intact, normal light reflex, no erythema, no bulging    Eyes: PERRLA, extraocular membranes intact, sclera white    Nose: nasal turbinates moist, purulent nasal discharge    Throat: moist mucus membranes, no erythema, vesicular lesions noted at the oropharynx on the right superior hard palate. Cardio: regular rate and rhythm, S1S2 heard, no murmurs appreciated Pulm: clear to auscultation bilaterally, no wheezes, rhonchi or rales; normal work of breathing on room air  Assessment/ Plan: 69 y.o. female   1. Overactive bladder Trial of Myrbetriq. - mirabegron ER (MYRBETRIQ) 25 MG TB24 tablet; Take 1 tablet (25 mg total) by mouth daily.  Dispense: 30 tablet; Refill: 2  2. Urinary frequency Likely due to the  above.  Her urine did show few bacteria and therefore we will proceed with Omnicef to treat both presumed sinus infection and possible UTI. - Urinalysis, Complete - cefdinir (OMNICEF) 300 MG capsule; Take 1 capsule (300 mg total) by mouth 2 (two) times daily. 1 po BID  Dispense: 20 capsule; Refill: 0  3. Rhinosinusitis Discussed there still is a remote possibility of Covid infection since she started developing symptoms before the 2-week completion.  Of the vaccine.  We will empirically treat with oral antibiotics.  Low threshold to have testing done if symptoms do not improve. - cefdinir (  OMNICEF) 300 MG capsule; Take 1 capsule (300 mg total) by mouth 2 (two) times daily. 1 po BID  Dispense: 20 capsule; Refill: 0 - magic mouthwash w/lidocaine SOLN; Gargle and spit 38mL every 6 hours as needed for sore throat  Dispense: 240 mL; Refill: 0   No orders of the defined types were placed in this encounter.  No orders of the defined types were placed in this encounter.    Janora Norlander, DO [place] [phone]

## 2019-11-30 ENCOUNTER — Telehealth: Payer: Self-pay | Admitting: *Deleted

## 2019-11-30 ENCOUNTER — Telehealth: Payer: Self-pay | Admitting: Family Medicine

## 2019-11-30 NOTE — Telephone Encounter (Signed)
Both are on the BEER's criteria and not recommended in age >67 yo.  I'd like to do a PA if possible.  I've also reached out to [place] to see if she has any ideas.

## 2019-11-30 NOTE — Telephone Encounter (Signed)
Pt made aware that we do not have samples or rebate cards at this time. She has spoken with her insurance and they are going to lower the tier but med is still expensive. Is there another medication she can try?

## 2019-11-30 NOTE — Telephone Encounter (Signed)
We don't have any samples right now.  Let me see if Almyra Free has any ideas.

## 2019-11-30 NOTE — Telephone Encounter (Signed)
MYRETRIQ Tablet ER 24HR-non preferred by insurance  Formulary alternatives include: OXYBUTYNIN CHLORIDE TABLET, SYRUP OR ER TABLET TROSPIUM CHLORIDE TABLET TROSPIUM CHLORIDE ER CAPSULE

## 2019-12-01 ENCOUNTER — Other Ambulatory Visit: Payer: Self-pay | Admitting: Family Medicine

## 2019-12-01 ENCOUNTER — Ambulatory Visit: Payer: Medicare HMO | Admitting: Family Medicine

## 2019-12-01 DIAGNOSIS — N3281 Overactive bladder: Secondary | ICD-10-CM

## 2019-12-01 DIAGNOSIS — R350 Frequency of micturition: Secondary | ICD-10-CM

## 2019-12-01 MED ORDER — TROSPIUM CHLORIDE 20 MG PO TABS: 20.0000 mg | ORAL_TABLET | Freq: Two times a day (BID) | ORAL | 0 refills | Status: DC

## 2019-12-01 NOTE — Telephone Encounter (Signed)
Hey! Yes, unfortunately there is no patient assistance for this medication (unless insurance DOES NOT cover, interesting I know, but that is the rule for that program) We are working to request samples.  All of the other medications are antimuscarinic (oxybutynin, vesicare, tovaiz, etc).  They would be cheaper, but more potential side effects.  Tier reduction should help for patient.  I can reach out if needed  Thanks! Almyra Free

## 2019-12-01 NOTE — Telephone Encounter (Signed)
Please inform her that I sent in [place], one of the medications at her insurance covers. Unfortunately, as basically any of the other ones that were listed as covered medications this medication has the risk of dry mouth, rash, constipation and in severe cases angioedema.

## 2019-12-01 NOTE — Telephone Encounter (Signed)
Per MD: Please inform her that I sent in [place], one of the medications at her insurance covers. Unfortunately, as basically any of the other ones that were listed as covered medications this medication has the risk of dry mouth, rash, constipation and in severe cases angioedema.

## 2019-12-01 NOTE — Telephone Encounter (Signed)
Left detailed message on patients voicemail that new med was called in and to contact the office with any questions

## 2019-12-02 DIAGNOSIS — J3089 Other allergic rhinitis: Secondary | ICD-10-CM | POA: Diagnosis not present

## 2019-12-02 DIAGNOSIS — J301 Allergic rhinitis due to pollen: Secondary | ICD-10-CM | POA: Diagnosis not present

## 2019-12-02 DIAGNOSIS — J3081 Allergic rhinitis due to animal (cat) (dog) hair and dander: Secondary | ICD-10-CM | POA: Diagnosis not present

## 2019-12-05 DIAGNOSIS — J301 Allergic rhinitis due to pollen: Secondary | ICD-10-CM | POA: Diagnosis not present

## 2019-12-05 DIAGNOSIS — J3089 Other allergic rhinitis: Secondary | ICD-10-CM | POA: Diagnosis not present

## 2019-12-05 DIAGNOSIS — J3081 Allergic rhinitis due to animal (cat) (dog) hair and dander: Secondary | ICD-10-CM | POA: Diagnosis not present

## 2019-12-05 NOTE — Telephone Encounter (Signed)
lmtcb

## 2019-12-06 ENCOUNTER — Ambulatory Visit: Payer: Medicare HMO | Admitting: General Surgery

## 2019-12-08 DIAGNOSIS — I10 Essential (primary) hypertension: Secondary | ICD-10-CM | POA: Diagnosis not present

## 2019-12-08 DIAGNOSIS — Z008 Encounter for other general examination: Secondary | ICD-10-CM | POA: Diagnosis not present

## 2019-12-08 DIAGNOSIS — K219 Gastro-esophageal reflux disease without esophagitis: Secondary | ICD-10-CM | POA: Diagnosis not present

## 2019-12-08 DIAGNOSIS — G4700 Insomnia, unspecified: Secondary | ICD-10-CM | POA: Diagnosis not present

## 2019-12-08 DIAGNOSIS — R32 Unspecified urinary incontinence: Secondary | ICD-10-CM | POA: Diagnosis not present

## 2019-12-08 DIAGNOSIS — K5900 Constipation, unspecified: Secondary | ICD-10-CM | POA: Diagnosis not present

## 2019-12-08 DIAGNOSIS — G8929 Other chronic pain: Secondary | ICD-10-CM | POA: Diagnosis not present

## 2019-12-08 DIAGNOSIS — E785 Hyperlipidemia, unspecified: Secondary | ICD-10-CM | POA: Diagnosis not present

## 2019-12-08 DIAGNOSIS — M1990 Unspecified osteoarthritis, unspecified site: Secondary | ICD-10-CM | POA: Diagnosis not present

## 2019-12-08 DIAGNOSIS — J45909 Unspecified asthma, uncomplicated: Secondary | ICD-10-CM | POA: Diagnosis not present

## 2019-12-14 ENCOUNTER — Telehealth: Payer: Self-pay | Admitting: *Deleted

## 2019-12-14 NOTE — Telephone Encounter (Signed)
PaA denied for myrbetriq. Provider changed to [facility] on 04/28. Please see call on 04/28.

## 2019-12-15 ENCOUNTER — Ambulatory Visit: Payer: Medicare HMO | Admitting: General Surgery

## 2019-12-15 ENCOUNTER — Encounter: Payer: Self-pay | Admitting: General Surgery

## 2019-12-15 ENCOUNTER — Other Ambulatory Visit: Payer: Self-pay

## 2019-12-15 VITALS — BP 147/78 | HR 63 | Temp 98.1°F | Resp 14 | Ht 69.0 in | Wt 244.0 lb

## 2019-12-15 DIAGNOSIS — D1724 Benign lipomatous neoplasm of skin and subcutaneous tissue of left leg: Secondary | ICD-10-CM | POA: Insufficient documentation

## 2019-12-15 DIAGNOSIS — D1723 Benign lipomatous neoplasm of skin and subcutaneous tissue of right leg: Secondary | ICD-10-CM | POA: Diagnosis not present

## 2019-12-15 DIAGNOSIS — D171 Benign lipomatous neoplasm of skin and subcutaneous tissue of trunk: Secondary | ICD-10-CM

## 2019-12-15 NOTE — Patient Instructions (Signed)
Excision of lipomas from breast and upper abdomen next week.  Lipoma Removal  Lipoma removal is a surgical procedure to remove a lipoma, which is a noncancerous (benign) tumor that is made up of fat cells. Most lipomas are small and painless and do not require treatment. They can form in many areas of the body but are most common under the skin of the back, arms, shoulders, buttocks, and thighs. You may need lipoma removal if you have a lipoma that is large, growing, or causing discomfort. Lipoma removal may also be done for cosmetic reasons. Tell a health care provider about:  Any allergies you have.  All medicines you are taking, including vitamins, herbs, eye drops, creams, and over-the-counter medicines.  Any problems you or family members have had with anesthetic medicines.  Any blood disorders you have.  Any surgeries you have had.  Any medical conditions you have.  Whether you are pregnant or may be pregnant. What are the risks? Generally, this is a safe procedure. However, problems may occur, including:  Infection.  Bleeding.  Scarring.  Allergic reactions to medicines.  Damage to nearby structures or organs, such as damage to nerves or blood vessels near the lipoma. What happens before the procedure? Staying hydrated Follow instructions from your health care provider about hydration, which may include:  Up to 2 hours before the procedure - you may continue to drink clear liquids, such as water, clear fruit juice, black coffee, and plain tea. Eating and drinking restrictions Follow instructions from your health care provider about eating and drinking, which may include:  8 hours before the procedure - stop eating heavy meals or foods, such as meat, fried foods, or fatty foods.  6 hours before the procedure - stop eating light meals or foods, such as toast or cereal.  6 hours before the procedure - stop drinking milk or drinks that contain milk.  2 hours before  the procedure - stop drinking clear liquids. Medicines Ask your health care provider about:  Changing or stopping your regular medicines. This is especially important if you are taking diabetes medicines or blood thinners.  Taking medicines such as aspirin and ibuprofen. These medicines can thin your blood. Do not take these medicines unless your health care provider tells you to take them.  Taking over-the-counter medicines, vitamins, herbs, and supplements. General instructions  You will have a physical exam. Your health care provider will check the size of the lipoma and whether it can be moved easily.  You may have a biopsy and imaging tests, such as X-rays, a CT scan, and an MRI.  Do not use any products that contain nicotine or tobacco for at least 4 weeks before the procedure. These products include cigarettes, e-cigarettes, and chewing tobacco. If you need help quitting, ask your health care provider.  Ask your health care provider: ? How your surgery site will be marked. ? What steps will be taken to help prevent infection. These may include:  Washing skin with a germ-killing soap.  Taking antibiotic medicine.  Plan to have someone take you home from the hospital or clinic.  If you will be going home right after the procedure, plan to have someone with you for 24 hours. What happens during the procedure?   An IV will be inserted into one of your veins.  You will be given one or more of the following: ? A medicine to help you relax (sedative). ? A medicine to numb the area (local anesthetic). ?  A medicine to make you fall asleep (general anesthetic). ? A medicine that is injected into an area of your body to numb everything below the injection site (regional anesthetic).  An incision will be made over the lipoma or very near the lipoma. The incision may be made in a natural skin line or crease.  Tissues, nerves, and blood vessels near the lipoma will be moved out of  the way.  The lipoma and the capsule that surrounds it will be separated from the surrounding tissues.  The lipoma will be removed.  The incision may be closed with stitches (sutures).  A bandage (dressing) will be placed over the incision. The procedure may vary among health care providers and hospitals. What happens after the procedure?  Your blood pressure, heart rate, breathing rate, and blood oxygen level will be monitored until you leave the hospital or clinic.  If you were prescribed an antibiotic medicine, use it as told by your health care provider. Do not stop using the antibiotic even if you start to feel better.  If you were given a sedative during the procedure, it can affect you for several hours. Do not drive or operate machinery until your health care provider says that it is safe. Summary  Before the procedure, follow instructions from your health care provider about eating and drinking, and changing or stopping your regular medicines. This is especially important if you are taking diabetes medicines or blood thinners.  After the lipoma is removed, the incision may be closed with stitches (sutures) and covered with a bandage (dressing).  If you were given a sedative during the procedure, it can affect you for several hours. Do not drive or operate machinery until your health care provider says that it is safe. This information is not intended to replace advice given to you by your health care provider. Make sure you discuss any questions you have with your health care provider. Document Revised: 03/07/2019 Document Reviewed: 03/07/2019 Elsevier Patient Education  [employer].

## 2019-12-15 NOTE — Progress Notes (Signed)
[facility] History and Physical  Reason for Referral: Lipoma Referring Physician: Janora Norlander, DO  Chief Complaint    New Patient (Initial Visit)      Kristina Mclaughlin is a 69 y.o. female.  HPI:  Kristina Mclaughlin is a very sweet 69 yo who has had multiple lipomas all over her body for some time and has had several removed by various physicians in the past. Kristina Mclaughlin says Kristina Mclaughlin notices them when Kristina Mclaughlin rubs the area or Kristina Mclaughlin notices them when Kristina Mclaughlin is hurting or aching in the area. Kristina Mclaughlin has areas over her lateral ankles that Kristina Mclaughlin reports sometimes interfere with shoes, and Kristina Mclaughlin has several running under her bra line as well as several areas in her lower pants line and upper pubis area.  Kristina Mclaughlin has an area in the left breast that Kristina Mclaughlin has known about for some time and has had imaging that confirmed it was a lipoma. This area has been in the breast for several years and imaging dates back to 2017. Her most recent mammogram was in 06/2019 and was benign.  Kristina Mclaughlin says that Kristina Mclaughlin would like to get some of the lipomas removed as Kristina Mclaughlin has had improvement in her discomfort and tenderness when Kristina Mclaughlin has them removed in the past. Kristina Mclaughlin has always had them removed in an office setting and did well with local injection and removal.    Kristina Mclaughlin denies any of these areas growing rapidly or ever becoming infected like a cyst. Kristina Mclaughlin does have a grandmother with breast cancer on her mother's side. Kristina Mclaughlin has significant history of lipomas in her family.    Past Medical History:  Diagnosis Date  . 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 Essential hypertension 10/29/2015  . Hypertension   . Insomnia     Past Surgical History:  Procedure Laterality Date  . ABDOMINAL HYSTERECTOMY    . BACK SURGERY    . BREAST SURGERY     biopsy x2; reduction  . EXAMINATION UNDER ANESTHESIA  08/27/2012  . REDUCTION MAMMAPLASTY Bilateral 2002    Family History  Problem Relation Age of Onset  . Bladder  Cancer Mother   . Allergies Mother   . Heart disease Mother   . Allergies Sister   . Alzheimer's disease Father   . Breast cancer Neg Hx     Social History   Tobacco Use  . Smoking status: Never Smoker  . Smokeless tobacco: Never Used  Substance Use Topics  . Alcohol use: No    Alcohol/week: 0.0 standard drinks  . Drug use: No    Medications: I have reviewed the patient's current medications. Allergies as of 12/15/2019      Reactions   Avelox Abc [moxifloxacin] Anaphylaxis   Lovenox [enoxaparin Sodium] Anaphylaxis   Quinolones Hives, Shortness Of Breath   Mucinex Sinus-max [phenylephrine-apap-guaifenesin] Swelling      Medication List       Accurate as of Dec 15, 2019  2:34 PM. If you have any questions, ask your nurse or doctor.        STOP taking these medications   cefdinir 300 MG capsule Commonly known as: OMNICEF Stopped by: Virl Cagey, MD     TAKE these medications   albuterol 108 (90 Base) MCG/ACT inhaler Commonly known as: ProAir HFA INHALE 2 PUFFS INTO THE LUNGS EVERY 6 (SIX) HOURS AS NEEDED FOR WHEEZING OR SHORTNESS OF BREATH.   atorvastatin 40 MG tablet Commonly known as: LIPITOR  Take 1 tablet (40 mg total) by mouth daily.   BIOTIN PO Take 1 capsule by mouth daily.   calcium carbonate 1500 (600 Ca) MG Tabs tablet Commonly known as: OSCAL Take by mouth.   ciclopirox 8 % solution Commonly known as: PENLAC ciclopirox 8 % topical solution  APPLY TO AFFECTED NAILS ONCE DAILY   diclofenac Sodium 1 % Gel Commonly known as: Voltaren Apply 4 g topically 4 (four) times daily.   EPINEPHrine 0.3 mg/0.3 mL Soaj injection Commonly known as: EPI-PEN See admin instructions.   fluticasone 50 MCG/ACT nasal spray Commonly known as: FLONASE PLACE 2 SPRAYS INTO BOTH NOSTRILS DAILY.   levocetirizine 5 MG tablet Commonly known as: XYZAL TAKE 1 TABLET ONCE A DAY IN THE EVENING   magic mouthwash w/lidocaine Soln Gargle and spit 60mL every 6 hours  as needed for sore throat   metoprolol succinate 25 MG 24 hr tablet Commonly known as: TOPROL-XL Take 1 tablet (25 mg total) by mouth daily.   MULTI-VITAMIN PO Take 1 tablet by mouth daily.   omeprazole 40 MG capsule Commonly known as: PRILOSEC Take 1 capsule (40 mg total) by mouth daily.   PROBIOTIC DAILY PO Take 1 tablet by mouth daily.   psyllium 58.6 % packet Commonly known as: METAMUCIL Take by mouth.   Symbicort 80-4.5 MCG/ACT inhaler Generic drug: budesonide-formoterol SMARTSIG:2 Puff(s) By Mouth Twice Daily   traZODone 150 MG tablet Commonly known as: DESYREL Take 1 tablet (150 mg total) by mouth at bedtime as needed for sleep.   trospium 20 MG tablet Commonly known as: SANCTURA Take 1 tablet (20 mg total) by mouth 2 (two) times daily.   valsartan-hydrochlorothiazide 320-25 MG tablet Commonly known as: DIOVAN-HCT Take 1 tablet by mouth daily.        ROS:  A comprehensive review of systems was negative except for: Ears, nose, mouth, throat, and face: positive for nasal congestion and sore throat Respiratory: positive for cough and wheezing Musculoskeletal: positive for knee pain, and pain in the area of the lipomas  Blood pressure (!) 147/78, pulse 63, temperature 98.1 F (36.7 C), resp. rate 14, height 5\' 9"  (1.753 m), weight 244 lb (110.7 kg), SpO2 93 %. Physical Exam Vitals reviewed.  Constitutional:      Appearance: Kristina Mclaughlin is normal weight.  HENT:     Head: Normocephalic and atraumatic.     Nose: Nose normal.     Mouth/Throat:     Mouth: Mucous membranes are moist.  Eyes:     Extraocular Movements: Extraocular movements intact.     Pupils: Pupils are equal, round, and reactive to light.  Cardiovascular:     Rate and Rhythm: Normal rate and regular rhythm.  Pulmonary:     Effort: Pulmonary effort is normal.     Breath sounds: Normal breath sounds.  Chest:     Breasts:        Left: Mass and tenderness present. No swelling or bleeding.      Comments: Medial left breast superficial mobile mass 1.5cm in size, identified on the [place] and appears superficial to the true breast tissue  Abdominal:     General: There is no distension.     Palpations: Abdomen is soft.     Tenderness: There is abdominal tenderness.     Comments: 6+ areas over the upper epigastric region under the bra line with 1-2cm lobules of fat that are hardened and tender, identified with the ultrasound to be superficial and contained in lobules  Genitourinary:  Comments: Upper pubic area with several harden superficial lobules of fat at and overlying the belt line 5+ areas, 1-2cm each in size Musculoskeletal:        General: Tenderness present. No swelling.     Cervical back: Normal range of motion.     Comments: Bilateral ankles on the lateral side above the malleolus large soft, superficial 3+cm areas that appear to be fat tissue on US imaging and not fluid filled   Skin:    General: Skin is warm and dry.  Neurological:     General: No focal deficit present.     Mental Status: Kristina Mclaughlin is alert and oriented to person, place, and time.  Psychiatric:        Mood and Affect: Mood normal.        Behavior: Behavior normal.        Thought Content: Thought content normal.        Judgment: Judgment normal.     Results: CLINICAL DATA:  Screening.  EXAM: DIGITAL SCREENING BILATERAL MAMMOGRAM WITH TOMO AND CAD  COMPARISON:  Previous exam(s).  ACR Breast Density Category b: There are scattered areas of fibroglandular density.  FINDINGS: There are no findings suspicious for malignancy. Images were processed with CAD.  IMPRESSION: No mammographic evidence of malignancy. A result letter of this screening mammogram will be mailed directly to the patient.  RECOMMENDATION: Screening mammogram in one year. (Code:SM-B-01Y)  BI-RADS CATEGORY  1: Negative.   Electronically Signed   By: Abelardo Diesel M.D.   On: 06/07/2019 15:54  CLINICAL DATA:   69 year old female presenting for evaluation of a palpable lump in the left breast for 1 year. Patient also describes burning pain in the lateral left breast pain for 2 weeks.  EXAM: 2D DIGITAL DIAGNOSTIC BILATERAL MAMMOGRAM WITH CAD AND ADJUNCT TOMO  LEFT BREAST ULTRASOUND  COMPARISON:  Previous exam(s).  ACR Breast Density Category b: There are scattered areas of fibroglandular density.  FINDINGS: A BB has been placed on the medial aspect of the left breast. There are no suspicious mammographic finding seen deep to the palpable marker. No suspicious calcifications, masses or areas of distortion are seen in the bilateral breasts.  Mammographic images were processed with CAD.  Ultrasound targeted to the left breast at 3 o'clock, 7 cm from the nipple in the area of pain demonstrates normal fibroglandular tissue. No masses or suspicious areas of shadowing are identified.  The ultrasound of the left breast at 9 o'clock, 6 cm from the nipple demonstrates a slightly echogenic mass measuring 2.4 x 0.8 x 2.5 cm. There is no tissue density on the mammogram in this area, and therefore this likely represents a lipoma or possibly an echogenic fat lobule.  IMPRESSION: 1. There is no mammographic or targeted sonographic correlate for the burning pain in the lateral left breast.  2. The palpable mass in the medial left breast likely represents a lipoma, or possibly a fat lobule.  3.  No mammographic evidence of malignancy in the bilateral breasts.  RECOMMENDATION: 1.  Screening mammogram in one year.(Code:SM-B-01Y)  I have discussed the findings and recommendations with the patient. Results were also provided in writing at the conclusion of the visit. If applicable, a reminder letter will be sent to the patient regarding the next appointment.  BI-RADS CATEGORY  2: Benign.   Electronically Signed   By: Ammie Ferrier M.D.   On: 05/15/2016  16:22   Assessment & Plan:  Wiktoria Hino is a 69 y.o.  female with extensive lipomas over her body. The ones that Kristina Mclaughlin would like to start with removing is the left breast and the upper abdominal areas. We discussed that we would send these to pathology to ensure these were nothing else, and that we could always find something additional on the breast like cancer but that this would be unusual given the chronicity and her 2020 mammogram.    -Discussed excision in the office with local, and starting with the breast and upper abdomen and moving to other areas in the future.  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 I will show her this when Kristina Mclaughlin returns. -Plans for excision with local in the office next week   All questions were answered to the satisfaction of the patient.   Virl Cagey 12/15/2019, 2:34 PM

## 2019-12-16 DIAGNOSIS — J3081 Allergic rhinitis due to animal (cat) (dog) hair and dander: Secondary | ICD-10-CM | POA: Diagnosis not present

## 2019-12-16 DIAGNOSIS — J3089 Other allergic rhinitis: Secondary | ICD-10-CM | POA: Diagnosis not present

## 2019-12-16 DIAGNOSIS — J301 Allergic rhinitis due to pollen: Secondary | ICD-10-CM | POA: Diagnosis not present

## 2019-12-20 ENCOUNTER — Telehealth: Payer: Self-pay | Admitting: Family Medicine

## 2019-12-20 NOTE — Telephone Encounter (Signed)
Pt informed to start taking 17g of Miralax in 8oz of water daily. If this does not help then she may increase to 2 capfuls daily. She knows to increase her water intake and eat more fruits and vegetables.

## 2019-12-22 ENCOUNTER — Other Ambulatory Visit: Payer: Self-pay

## 2019-12-22 ENCOUNTER — Encounter: Payer: Self-pay | Admitting: General Surgery

## 2019-12-22 ENCOUNTER — Ambulatory Visit (INDEPENDENT_AMBULATORY_CARE_PROVIDER_SITE_OTHER): Payer: Medicare HMO | Admitting: General Surgery

## 2019-12-22 VITALS — BP 145/79 | HR 57 | Temp 98.2°F | Ht 69.0 in | Wt 244.0 lb

## 2019-12-22 DIAGNOSIS — D171 Benign lipomatous neoplasm of skin and subcutaneous tissue of trunk: Secondary | ICD-10-CM

## 2019-12-23 ENCOUNTER — Encounter: Payer: Self-pay | Admitting: General Surgery

## 2019-12-23 ENCOUNTER — Other Ambulatory Visit: Payer: Self-pay | Admitting: General Surgery

## 2019-12-23 DIAGNOSIS — D171 Benign lipomatous neoplasm of skin and subcutaneous tissue of trunk: Secondary | ICD-10-CM | POA: Diagnosis not present

## 2019-12-23 MED ORDER — OXYCODONE HCL 5 MG PO TABS: 5.0000 mg | ORAL_TABLET | ORAL | 0 refills | Status: DC | PRN

## 2019-12-23 NOTE — Progress Notes (Signed)
[facility] Procedure Note  12/22/2019    Preoperative Diagnosis: Lipomas of the left breast, upper abdomen    Postoperative Diagnosis: Same   Procedure(s) Performed: Excision of lipoma of left breast and upper abdomen totaling > 6cm in size    Surgeon: Lanell Matar. Constance Haw, MD   Assistants: No qualified resident was available    Anesthesia: 1% Xylocaine with epinephrine ~ 30cc    Specimens: Left breast lipoma, Upper abdominal lipomas    Estimated Blood Loss: Minimal   Wound Class:Clean    Procedure Indications: Kristina Mclaughlin is a very sweet 69 yo with multiple lipomas throughout her body that have started since about middle age. She is able to find multiple in her upper abdomen, lower abdomen, on her left breast, and over her ankles laterally. She says they are uncomfortable and wants them removed. She has had prior local excisions and tolerated this without issues. She has had breast imaging demonstrating the breast lesion as a lipoma in the medial left breast.  We discussed the risk of the procedure including bleeding, recurrence, finding something on pathology unexpected and needing further surgery.   Findings: 7+ areas palpable on upper abdomen consistent with lipoma, left medial breast with superficial mass lesion    Procedure: The patient was taken to the procedure room and placed supine. The left breast was prepared and draped in the usual sterile fashion. Local anesthetic was injected. An incision was made over the palpable lesion in the left medial breast, and carried down through the dermis with sharp scalp dissection. Using a combination of sharp dissection with scissors and blunt dissection with hemostats the lipoma was excised, and measured about 1.5cm in size.  The cavity was made hemostatic. Interrupted 3-0 Vicryl sutures were placed in the dermis and a running 4-0 Monocryl subcuticular was placed to close the skin. Dermabond was placed over the skin. The  breast lipoma was sent separately to pathology.   Attention was then turned to the upper abdomen. The area was prepped and draped in the normal sterile fashion.  The upper abdomen was palpated and 7 spots were marked after palpation. These areas varied in size from 0.5cm to 1.5cm in size. Local anesthetic was injected into each of there areas. Using the same technique described above each area was incised and a combination of sharp and blunt dissection was used to remove the lipomas. The abdominal lipomas were sent in the same specimen cup.  These were superficial in nature. Hemostasis was obtained, and the skin was closed with interrupted 4-0 Monocryl sutures and Dermabond.  The total area of lipoma removed was about 5cm.  The patient tolerated the procedure well.   Final inspection revealed acceptable hemostasis. All counts were correct at the end of the case.   Plan to see the patient back in a few weeks to ensure healing and to excise additional lipomas.  PRN tylenol and ibuprofen for pain control. Roxicodone Rx sent in for patient after I spoke with her, and she was having some pain on 12/23/2019.   Future Appointments  Date Time Provider [place]  01/12/2020  3:30 PM Kristina Cagey, MD [facility] None      Curlene Labrum, MD [facility] [address] [place], [place] [address] [phone] (office)   \

## 2019-12-25 ENCOUNTER — Other Ambulatory Visit: Payer: Self-pay | Admitting: Family Medicine

## 2019-12-25 DIAGNOSIS — N3281 Overactive bladder: Secondary | ICD-10-CM

## 2019-12-25 DIAGNOSIS — R350 Frequency of micturition: Secondary | ICD-10-CM

## 2019-12-29 ENCOUNTER — Telehealth: Payer: Self-pay | Admitting: Family Medicine

## 2019-12-29 DIAGNOSIS — J301 Allergic rhinitis due to pollen: Secondary | ICD-10-CM | POA: Diagnosis not present

## 2019-12-29 DIAGNOSIS — J3089 Other allergic rhinitis: Secondary | ICD-10-CM | POA: Diagnosis not present

## 2019-12-29 DIAGNOSIS — J3081 Allergic rhinitis due to animal (cat) (dog) hair and dander: Secondary | ICD-10-CM | POA: Diagnosis not present

## 2019-12-29 NOTE — Telephone Encounter (Signed)
Patient aware her last labs here were 4/27 and she has labs with Dr. Constance Haw.  Patient states she will call Dr. Constance Haw office.

## 2020-01-09 DIAGNOSIS — R69 Illness, unspecified: Secondary | ICD-10-CM | POA: Diagnosis not present

## 2020-01-12 ENCOUNTER — Other Ambulatory Visit: Payer: Self-pay

## 2020-01-12 ENCOUNTER — Ambulatory Visit (INDEPENDENT_AMBULATORY_CARE_PROVIDER_SITE_OTHER): Payer: Medicare HMO | Admitting: General Surgery

## 2020-01-12 ENCOUNTER — Other Ambulatory Visit: Payer: Self-pay | Admitting: General Surgery

## 2020-01-12 ENCOUNTER — Encounter: Payer: Self-pay | Admitting: General Surgery

## 2020-01-12 VITALS — BP 126/78 | HR 54 | Temp 97.6°F | Resp 16 | Ht 69.0 in | Wt 245.0 lb

## 2020-01-12 DIAGNOSIS — D1724 Benign lipomatous neoplasm of skin and subcutaneous tissue of left leg: Secondary | ICD-10-CM | POA: Diagnosis not present

## 2020-01-12 DIAGNOSIS — D1723 Benign lipomatous neoplasm of skin and subcutaneous tissue of right leg: Secondary | ICD-10-CM | POA: Diagnosis not present

## 2020-01-13 ENCOUNTER — Telehealth: Payer: Self-pay | Admitting: Family Medicine

## 2020-01-13 MED ORDER — OXYCODONE HCL 5 MG PO TABS: 5.0000 mg | ORAL_TABLET | ORAL | 0 refills | Status: DC | PRN

## 2020-01-13 NOTE — Telephone Encounter (Signed)
Pt called and states that she is in a lot of pain especially on the right ankle and is requesting a refill on her pain medication. Dr. Constance Haw made aware and sent in refill along with that recommends rotating Tylenol and ibuprofen.   Patient made aware of provider recommendations and of refill.

## 2020-01-15 NOTE — Progress Notes (Signed)
[facility] Procedure Note  01/12/2020    Preoperative Diagnosis: Left and right ankle lipomas   Postoperative Diagnosis: Same   Procedure(s) Performed:  Excision of right ankle lipoma (4 cm), partial removal of left ankle lipomatous tissue    Surgeon: Lanell Matar. Constance Haw, MD   Assistants: No qualified resident was available    Anesthesia:  Lidocaine 1%    Specimens:  Right ankle lipoma    Estimated Blood Loss: Minimal   Wound Class: Clean    Findings: Globular with extensions right ankle lipoma, small individual lipomatous adipose tissue from left ankle    Procedure: The patient was taken to the procedure room and placed supine. The right ankle was prepped and draped, and lidocaine was injected over the lipomatous area. An incision was made in the longitudinal axis of the ankle, anterior to the lateral malleolous, and carried down to the adipose tissue. Using a combination of sharp and blunt dissection, a globular pieces of adipose tissue with finger extensions was excised.  There was some bleeding and this was packed off.    Attention was then turned to the left ankle which was prepped and draped and the standard fashion. Lidocaine was injected and an incision was made in the longitudinal axis posterior to the lateral malleolus, and carried down the adipose tissue.  Attempts at bluntly dissecting out a lipoma revealed more of individual small lipomatous globes of tissue, and not a larger individual piece. The tissue that could be removed was removed, but the patient was having some discomfort so this was aborted, and I did not want to get deeper than the superficial plane.  Hemostasis was confirmed, and the incision was closed with 3-0 interrupted vicryl in the deep space, and a running 4-0 Monocryl subcuticular and dermabond.   Attention was turned back to the right ankle incision, and after packing the bleeding and decreased but cautery was used to further control  the bleeding. The deep space was closed with 3-0 interrupted vicryl and the skin was closed with 4-0 Monocryl subcuticular and dermabond.  The right lipoma was sent to pathology, as the left side was minimal and in small pieces.   The patient tolerated the procedure. She was sent in pain medication to the pharmacy.     Curlene Labrum, MD [facility] [address] [place], [place] [address] [phone] (office)

## 2020-01-16 ENCOUNTER — Telehealth: Payer: Self-pay | Admitting: Family Medicine

## 2020-01-16 DIAGNOSIS — J3089 Other allergic rhinitis: Secondary | ICD-10-CM | POA: Diagnosis not present

## 2020-01-16 DIAGNOSIS — J3081 Allergic rhinitis due to animal (cat) (dog) hair and dander: Secondary | ICD-10-CM | POA: Diagnosis not present

## 2020-01-16 DIAGNOSIS — J301 Allergic rhinitis due to pollen: Secondary | ICD-10-CM | POA: Diagnosis not present

## 2020-01-16 NOTE — Telephone Encounter (Signed)
Pt called and made aware of recommendations.

## 2020-01-16 NOTE — Telephone Encounter (Signed)
-----   Message from Virl Cagey, MD sent at 01/16/2020 11:15 AM EDT ----- Cant reapply the glue, but I can check on it this week if she wants. I would keep it covered and keep area clean. Do not submerge, pat dry, keep dry guaze on it and change at least daily.  Curlene Labrum, MD [facility] [place], [place] [address] [phone] (office)   ----- Message ----- From: Alyson Locket, [place] Sent: 01/16/2020  10:47 AM EDT To: Virl Cagey, MD  Pt called stating that the glue is coming off her ankles mostly the left but some on the right . Her right ankle is also draining and I told her that was normal that she could keep it covered. I did not know if she needed to come back in to have glue re-applied?

## 2020-01-19 ENCOUNTER — Other Ambulatory Visit: Payer: Self-pay | Admitting: Family Medicine

## 2020-01-19 DIAGNOSIS — R350 Frequency of micturition: Secondary | ICD-10-CM

## 2020-01-19 DIAGNOSIS — N3281 Overactive bladder: Secondary | ICD-10-CM

## 2020-01-20 DIAGNOSIS — J3081 Allergic rhinitis due to animal (cat) (dog) hair and dander: Secondary | ICD-10-CM | POA: Diagnosis not present

## 2020-01-20 DIAGNOSIS — J301 Allergic rhinitis due to pollen: Secondary | ICD-10-CM | POA: Diagnosis not present

## 2020-01-20 DIAGNOSIS — J3089 Other allergic rhinitis: Secondary | ICD-10-CM | POA: Diagnosis not present

## 2020-01-23 ENCOUNTER — Telehealth: Payer: Self-pay | Admitting: Family Medicine

## 2020-01-23 NOTE — Telephone Encounter (Signed)
Patient has a follow up appointment scheduled. 

## 2020-01-23 NOTE — Telephone Encounter (Signed)
Pt called stating that she is having increase swelling her legs and ankles. Pt currently is taking HCTZ within her BP medication. She states that they start out in the AM just a little swelling but as the day goes by it gets worse. She states that her feet are sore on top and near surgical site. Dr. Constance Haw is out of town this week therefore I recommended she contact her PCP for an appointment so they can see how much swelling she is having. Pt verbalized understanding.

## 2020-01-23 NOTE — Telephone Encounter (Signed)
  Incoming Patient Call  01/23/2020  What symptoms do you have? Both feet are swelling and asking for Lasix. Told patient usually she has to be seen in order to get medication  How long have you been sick? One month  Have you been seen for this problem? Yes with the doctor that done her knee  If your provider decides to give you a prescription, which pharmacy would you [facility] for [facility] to be [facility] [place]   Patient informed that this information will be sent to the clinical staff for review and that they should receive a follow up call.

## 2020-01-24 ENCOUNTER — Other Ambulatory Visit: Payer: Self-pay

## 2020-01-24 ENCOUNTER — Encounter: Payer: Self-pay | Admitting: Family

## 2020-01-24 ENCOUNTER — Ambulatory Visit (INDEPENDENT_AMBULATORY_CARE_PROVIDER_SITE_OTHER): Payer: Medicare HMO | Admitting: Family

## 2020-01-24 VITALS — BP 139/81 | HR 63 | Temp 96.0°F | Ht 69.0 in | Wt 244.6 lb

## 2020-01-24 DIAGNOSIS — R609 Edema, unspecified: Secondary | ICD-10-CM | POA: Diagnosis not present

## 2020-01-24 NOTE — Progress Notes (Signed)
   Subjective:    Patient ID: Kristina Mclaughlin, female    DOB: [birth year], 69 y.o.   MRN: [id number]  Chief Complaint  Patient presents with  . Edema    feet and ankles. Just had ankle surgery 2 weeks ago     HPI PT presents to the office today with bilateral feet and ankle swelling over the last 2 1/2 weeks. She had surgery on 01/05/20 to removed lipomas on bilateral ankles. She reports intermittent aching pain of 3-4 out 10 at night. She states the swelling is worse at the end of the day when she has stood all day.   She called her surgeon and told her that she could have mild swelling, but should not be effecting her feet. She was told to follow up with her PCP.   Denies any fever.    Review of Systems  All other systems reviewed and are negative.      Objective:   Physical Exam Vitals reviewed.  Constitutional:      General: She is not in acute distress.    Appearance: She is well-developed.  HENT:     Head: Normocephalic and atraumatic.  Eyes:     Pupils: Pupils are equal, round, and reactive to light.  Neck:     Thyroid: No thyromegaly.  Cardiovascular:     Rate and Rhythm: Normal rate and regular rhythm.     Heart sounds: Normal heart sounds. No murmur heard.   Pulmonary:     Effort: Pulmonary effort is normal. No respiratory distress.     Breath sounds: Normal breath sounds. No wheezing.  Abdominal:     General: Bowel sounds are normal. There is no distension.     Palpations: Abdomen is soft.     Tenderness: There is no abdominal tenderness.  Musculoskeletal:        General: No tenderness. Normal range of motion.     Cervical back: Normal range of motion and neck supple.     Right lower leg: Edema (2+ ankle) present.     Left lower leg: Edema (2+ ankle) present.     Comments: Slight erythemas and warmth on bilateral lateral ankles   Skin:    General: Skin is warm and dry.  Neurological:     Mental Status: She is alert and oriented to person,  place, and time.     Cranial Nerves: No cranial nerve deficit.     Deep Tendon Reflexes: Reflexes are normal and symmetric.  Psychiatric:        Behavior: Behavior normal.        Thought Content: Thought content normal.        Judgment: Judgment normal.      BP 139/81   Pulse 63   Temp (!) 96 F (35.6 C) (Temporal)   Ht 5\' 9"  (1.753 m)   Wt 244 lb 9.6 oz (110.9 kg)   SpO2 92%   BMI 36.12 kg/m       Assessment & Plan:  1. Peripheral edema Compression hose daily Limit salt  Keep elevated Report any increased redness, warmth, or fevers - Compression stockings  Evelina Dun, FNP

## 2020-01-24 NOTE — Patient Instructions (Signed)

## 2020-01-25 DIAGNOSIS — J301 Allergic rhinitis due to pollen: Secondary | ICD-10-CM | POA: Diagnosis not present

## 2020-01-25 DIAGNOSIS — J3089 Other allergic rhinitis: Secondary | ICD-10-CM | POA: Diagnosis not present

## 2020-01-25 DIAGNOSIS — J3081 Allergic rhinitis due to animal (cat) (dog) hair and dander: Secondary | ICD-10-CM | POA: Diagnosis not present

## 2020-01-27 DIAGNOSIS — R69 Illness, unspecified: Secondary | ICD-10-CM | POA: Diagnosis not present

## 2020-01-30 DIAGNOSIS — J3081 Allergic rhinitis due to animal (cat) (dog) hair and dander: Secondary | ICD-10-CM | POA: Diagnosis not present

## 2020-01-30 DIAGNOSIS — J301 Allergic rhinitis due to pollen: Secondary | ICD-10-CM | POA: Diagnosis not present

## 2020-01-30 DIAGNOSIS — J3089 Other allergic rhinitis: Secondary | ICD-10-CM | POA: Diagnosis not present

## 2020-02-02 ENCOUNTER — Telehealth: Payer: Self-pay | Admitting: Family Medicine

## 2020-02-02 MED ORDER — CEPHALEXIN 500 MG PO CAPS: 500.0000 mg | ORAL_CAPSULE | Freq: Three times a day (TID) | ORAL | 0 refills | Status: DC

## 2020-02-02 NOTE — Telephone Encounter (Signed)
Patient aware and verbalized understanding. °

## 2020-02-02 NOTE — Telephone Encounter (Signed)
Keflex Prescription sent to pharmacy

## 2020-02-03 DIAGNOSIS — J3081 Allergic rhinitis due to animal (cat) (dog) hair and dander: Secondary | ICD-10-CM | POA: Diagnosis not present

## 2020-02-03 DIAGNOSIS — J3089 Other allergic rhinitis: Secondary | ICD-10-CM | POA: Diagnosis not present

## 2020-02-03 DIAGNOSIS — J301 Allergic rhinitis due to pollen: Secondary | ICD-10-CM | POA: Diagnosis not present

## 2020-02-08 DIAGNOSIS — R69 Illness, unspecified: Secondary | ICD-10-CM | POA: Diagnosis not present

## 2020-02-09 DIAGNOSIS — J3089 Other allergic rhinitis: Secondary | ICD-10-CM | POA: Diagnosis not present

## 2020-02-09 DIAGNOSIS — J301 Allergic rhinitis due to pollen: Secondary | ICD-10-CM | POA: Diagnosis not present

## 2020-02-09 DIAGNOSIS — J3081 Allergic rhinitis due to animal (cat) (dog) hair and dander: Secondary | ICD-10-CM | POA: Diagnosis not present

## 2020-02-13 DIAGNOSIS — J3081 Allergic rhinitis due to animal (cat) (dog) hair and dander: Secondary | ICD-10-CM | POA: Diagnosis not present

## 2020-02-13 DIAGNOSIS — J301 Allergic rhinitis due to pollen: Secondary | ICD-10-CM | POA: Diagnosis not present

## 2020-02-13 DIAGNOSIS — J3089 Other allergic rhinitis: Secondary | ICD-10-CM | POA: Diagnosis not present

## 2020-02-15 ENCOUNTER — Other Ambulatory Visit: Payer: Self-pay | Admitting: Family Medicine

## 2020-02-15 DIAGNOSIS — N3281 Overactive bladder: Secondary | ICD-10-CM

## 2020-02-15 DIAGNOSIS — R350 Frequency of micturition: Secondary | ICD-10-CM

## 2020-02-24 DIAGNOSIS — J301 Allergic rhinitis due to pollen: Secondary | ICD-10-CM | POA: Diagnosis not present

## 2020-02-24 DIAGNOSIS — J3089 Other allergic rhinitis: Secondary | ICD-10-CM | POA: Diagnosis not present

## 2020-02-24 DIAGNOSIS — J3081 Allergic rhinitis due to animal (cat) (dog) hair and dander: Secondary | ICD-10-CM | POA: Diagnosis not present

## 2020-02-27 ENCOUNTER — Telehealth: Payer: Self-pay | Admitting: Family Medicine

## 2020-02-27 NOTE — Telephone Encounter (Signed)
  Incoming Patient Call  02/27/2020  What symptoms do you have? Left foot is a little swollen and red, she had surgery to remove tumor about a Month ago  How long have you been sick? Redness comes and goes, she noticed it more today   Have you been seen for this problem? Yes by Alyse Low   If your provider decides to give you a prescription, which pharmacy would you like for it to be sent to? [facility]    Patient informed that this information will be sent to the clinical staff for review and that they should receive a follow up call.

## 2020-02-27 NOTE — Telephone Encounter (Signed)
Pt says she has swelling and redness and it is a little warm to the touch where she had the surgery. Offered pt appt tomorrow at 10:10 with Alyse Low but pt declined so offered her appt 02/29/20 at 11:10 with [facility] which pt accepted. Advised pt to monitor left foot and if it gets worse to call us back. Pt voiced understanding.

## 2020-02-28 ENCOUNTER — Other Ambulatory Visit: Payer: Self-pay

## 2020-02-28 ENCOUNTER — Ambulatory Visit: Payer: Medicare HMO | Admitting: Family

## 2020-02-29 ENCOUNTER — Ambulatory Visit: Payer: Medicare HMO | Admitting: Family

## 2020-03-01 ENCOUNTER — Encounter: Payer: Self-pay | Admitting: Family Medicine

## 2020-03-07 DIAGNOSIS — J3089 Other allergic rhinitis: Secondary | ICD-10-CM | POA: Diagnosis not present

## 2020-03-07 DIAGNOSIS — J301 Allergic rhinitis due to pollen: Secondary | ICD-10-CM | POA: Diagnosis not present

## 2020-03-07 DIAGNOSIS — J3081 Allergic rhinitis due to animal (cat) (dog) hair and dander: Secondary | ICD-10-CM | POA: Diagnosis not present

## 2020-03-13 ENCOUNTER — Other Ambulatory Visit: Payer: Self-pay | Admitting: Family Medicine

## 2020-03-13 DIAGNOSIS — N3281 Overactive bladder: Secondary | ICD-10-CM

## 2020-03-13 DIAGNOSIS — R350 Frequency of micturition: Secondary | ICD-10-CM

## 2020-03-13 NOTE — Telephone Encounter (Signed)
lmtcb to schedule follow up for medication refills

## 2020-03-13 NOTE — Telephone Encounter (Signed)
Gottschalk. NTBS 30 days given 02/15/20

## 2020-03-22 ENCOUNTER — Ambulatory Visit (INDEPENDENT_AMBULATORY_CARE_PROVIDER_SITE_OTHER): Payer: Medicare HMO | Admitting: Family Medicine

## 2020-03-22 ENCOUNTER — Encounter: Payer: Self-pay | Admitting: Family Medicine

## 2020-03-22 ENCOUNTER — Other Ambulatory Visit: Payer: Self-pay

## 2020-03-22 VITALS — BP 134/75 | HR 58 | Temp 97.7°F | Ht 69.0 in | Wt 241.6 lb

## 2020-03-22 DIAGNOSIS — T8130XA Disruption of wound, unspecified, initial encounter: Secondary | ICD-10-CM | POA: Diagnosis not present

## 2020-03-22 DIAGNOSIS — N3281 Overactive bladder: Secondary | ICD-10-CM | POA: Diagnosis not present

## 2020-03-22 DIAGNOSIS — L089 Local infection of the skin and subcutaneous tissue, unspecified: Secondary | ICD-10-CM | POA: Diagnosis not present

## 2020-03-22 DIAGNOSIS — R350 Frequency of micturition: Secondary | ICD-10-CM

## 2020-03-22 MED ORDER — DOXYCYCLINE HYCLATE 100 MG PO TABS: 100.0000 mg | ORAL_TABLET | Freq: Two times a day (BID) | ORAL | 0 refills | Status: AC

## 2020-03-22 MED ORDER — TROSPIUM CHLORIDE 20 MG PO TABS: 20.0000 mg | ORAL_TABLET | Freq: Two times a day (BID) | ORAL | 3 refills | Status: DC

## 2020-03-22 NOTE — Patient Instructions (Signed)
Wound Dehiscence  Wound dehiscence is when a cut from surgery (an incision) opens up and does not heal like it should. This problem usually happens 7-10 days after surgery. You may have bleeding from the cut. You may also have pain or a fever. This condition should be treated early. Follow these instructions at home: Medicines  Take over-the-counter and prescription medicines only as told by your doctor.  If you were prescribed an antibiotic medicine, take it as told by your doctor. Do not stop taking it even if you start to feel better.  Use medicines that help to stop itching as told by your doctor. The wound may feel itchy as it heals. Wound care   Follow instructions from your doctor about how to take care of your wound. Make sure you: ? Wash your hands with soap and water before you change your bandage (dressing) or wash the wound area. If you cannot use soap and water, use hand sanitizer. ? Wash your wound with mild soap and water 2 times a day, or as told. Rinse off the soap. Pat the area dry with a clean towel. Do not rub the wound. ? Change bandages as told by your doctor.  Do not pick or scratch at the wound.  Check your wound area every day for signs of infection. Check for: ? More redness, swelling, or pain. ? More fluid or blood. ? Warmth. ? Pus or a bad smell. Activity  Avoid exercises that make you sweat or could stretch your wound.  Do not lift anything that is heavier than 10 lb (4.5 kg) until the wound is healed or until your doctor says that it is safe. General instructions  Do not take baths, swim, or use a hot tub until your doctor approves. You may take showers.  Keep all follow-up visits as told by your doctor. This is important. Contact a doctor if:  Your wound does not seem to be healing right.  You have a fever. Get help right away if:  You have more redness, swelling, or pain around your wound.  You have more fluid coming from your  wound.  Your wound feels warm to the touch.  You have pus or a bad smell coming from your wound.  More of the wound breaks open.  You have red streaks spreading from your wound.  You have a lot of bleeding from your wound. Summary  Wound dehiscence is when a cut from surgery (an incision) opens up and does not heal like it should.  Follow instructions from your doctor about how to take care of your wound.  Check your wound area every day for signs of infection. These signs can be redness, swelling, pain, fluid, blood, warmth, pus, or a bad smell.  If you were prescribed an antibiotic medicine, take it as told by your doctor. Do not stop taking it even if you start to feel better.  Contact a doctor if your wound does not seem to be healing right. This information is not intended to replace advice given to you by your health care provider. Make sure you discuss any questions you have with your health care provider. Document Revised: 07/03/2017 Document Reviewed: 06/25/2016 Elsevier Patient Education  2020 [employer].

## 2020-03-22 NOTE — Progress Notes (Signed)
Subjective: CC: Wound infection PCP: Janora Norlander, DO SEG:BTDVVOH Kristina Mclaughlin is a 69 y.o. female presenting to clinic today for:  1.  Wound infection Patient reports that she had a fatty mass removed from the posterior left ankle in July.  She subsequently had dehiscence of the wound several times and each time it drained a foul-smelling fluid.  She denies any exquisite tenderness, increased heat.  She does have mild redness.  She is ambulating independently.  She has been applying Neosporin to the affected area.   ROS: Per HPI  Allergies  Allergen Reactions  . Avelox Abc [Moxifloxacin] Anaphylaxis  . Lovenox [Enoxaparin Sodium] Anaphylaxis  . Quinolones Hives and Shortness Of Breath  . Mucinex Sinus-Max [Phenylephrine-Apap-Guaifenesin] Swelling   Past Medical History:  Diagnosis Date  . 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 Essential hypertension 10/29/2015  . Hypertension   . Insomnia     Current Outpatient Medications:  .  albuterol (PROAIR HFA) 108 (90 Base) MCG/ACT inhaler, INHALE 2 PUFFS INTO THE LUNGS EVERY 6 (SIX) HOURS AS NEEDED FOR WHEEZING OR SHORTNESS OF BREATH., Disp: 8 g, Rfl: 2 .  atorvastatin (LIPITOR) 40 MG tablet, Take 1 tablet (40 mg total) by mouth daily., Disp: 90 tablet, Rfl: 3 .  BIOTIN PO, Take 1 capsule by mouth daily., Disp: , Rfl:  .  calcium carbonate (OSCAL) 1500 (600 Ca) MG TABS tablet, Take by mouth., Disp: , Rfl:  .  cephALEXin (KEFLEX) 500 MG capsule, Take 1 capsule (500 mg total) by mouth 3 (three) times daily., Disp: 21 capsule, Rfl: 0 .  ciclopirox (PENLAC) 8 % solution, ciclopirox 8 % topical solution  APPLY TO AFFECTED NAILS ONCE DAILY, Disp: , Rfl:  .  diclofenac Sodium (VOLTAREN) 1 % GEL, Apply 4 g topically 4 (four) times daily., Disp: 200 g, Rfl: prn .  EPINEPHrine 0.3 mg/0.3 mL IJ SOAJ injection, See admin instructions., Disp: , Rfl:  .  fluticasone (FLONASE) 50 MCG/ACT nasal spray,  PLACE 2 SPRAYS INTO BOTH NOSTRILS DAILY., Disp: 16 g, Rfl: 0 .  levocetirizine (XYZAL) 5 MG tablet, TAKE 1 TABLET ONCE A DAY IN THE EVENING, Disp: , Rfl: 3 .  magic mouthwash w/lidocaine SOLN, Gargle and spit 73mL every 6 hours as needed for sore throat, Disp: 240 mL, Rfl: 0 .  metoprolol succinate (TOPROL-XL) 25 MG 24 hr tablet, Take 1 tablet (25 mg total) by mouth daily., Disp: 90 tablet, Rfl: 2 .  Multiple Vitamin (MULTI-VITAMIN PO), Take 1 tablet by mouth daily. , Disp: , Rfl:  .  omeprazole (PRILOSEC) 40 MG capsule, Take 1 capsule (40 mg total) by mouth daily., Disp: 90 capsule, Rfl: 3 .  oxyCODONE (ROXICODONE) 5 MG immediate release tablet, Take 1 tablet (5 mg total) by mouth every 4 (four) hours as needed for severe pain or breakthrough pain., Disp: 15 tablet, Rfl: 0 .  Probiotic Product (PROBIOTIC DAILY PO), Take 1 tablet by mouth daily. , Disp: , Rfl:  .  psyllium (METAMUCIL) 58.6 % packet, Take by mouth., Disp: , Rfl:  .  SYMBICORT 80-4.5 MCG/ACT inhaler, SMARTSIG:2 Puff(s) By Mouth Twice Daily, Disp: , Rfl:  .  traZODone (DESYREL) 150 MG tablet, Take 1 tablet (150 mg total) by mouth at bedtime as needed for sleep., Disp: 90 tablet, Rfl: 3 .  trospium (SANCTURA) 20 MG tablet, TAKE 1 TABLET (20 MG TOTAL) BY MOUTH 2 (TWO) TIMES DAILY. NEEDS TO BE SEEN FOR FURTHER REFILLS., Disp:  60 tablet, Rfl: 0 .  valsartan-hydrochlorothiazide (DIOVAN-HCT) 320-25 MG tablet, Take 1 tablet by mouth daily., Disp: 90 tablet, Rfl: 3 .  doxycycline (VIBRA-TABS) 100 MG tablet, Take 1 tablet (100 mg total) by mouth 2 (two) times daily for 10 days., Disp: 20 tablet, Rfl: 0 Social History   Socioeconomic History  . Marital status: Married    Spouse name: Not on file  . Number of children: 2  . Years of education: Not on file  . Highest education level: Not on file  Occupational History  . Occupation: Geologist, engineering   Tobacco Use  . Smoking status: Never Smoker  . Smokeless tobacco: Never Used  Vaping Use  .  Vaping Use: Never used  Substance and Sexual Activity  . Alcohol use: No    Alcohol/week: 0.0 standard drinks  . Drug use: No  . Sexual activity: Not on file  Other Topics Concern  . 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  She tries to stay active.   Social Determinants of Health   Financial Resource Strain:   . Difficulty of Paying Living Expenses: Not on file  Food Insecurity:   . Worried About Charity fundraiser in the Last Year: Not on file  . Ran Out of Food in the Last Year: Not on file  Transportation Needs:   . Lack of Transportation (Medical): Not on file  . Lack of Transportation (Non-Medical): Not on file  Physical Activity:   . Days of Exercise per Week: Not on file  . Minutes of Exercise per Session: Not on file  Stress:   . Feeling of Stress : Not on file  Social Connections:   . Frequency of Communication with Friends and Family: Not on file  . Frequency of Social Gatherings with Friends and Family: Not on file  . Attends Religious Services: Not on file  . Active Member of Clubs or Organizations: Not on file  . Attends Archivist Meetings: Not on file  . Marital Status: Not on file  Intimate Partner Violence:   . Fear of Current or Ex-Partner: Not on file  . Emotionally Abused: Not on file  . Physically Abused: Not on file  . Sexually Abused: Not on file   Family History  Problem Relation Age of Onset  . Bladder Cancer Mother   . Allergies Mother   . Heart disease Mother   . Allergies Sister   . Alzheimer's disease Father   . Breast cancer Neg Hx     Objective: Office vital signs reviewed. BP 134/75   Pulse (!) 58   Temp 97.7 F (36.5 C)   Ht 5\' 9"  (1.753 m)   Wt 241 lb 9.6 oz (109.6 kg)   SpO2 92%   BMI 35.68 kg/m   Physical Examination:  General: Awake, alert, well nourished, No acute distress MSK:  Ambulating independently.  Normal gait and station Skin: She has a healing postsurgical site on the left posterior ankle that is vertical.  At the base of this, there is evidence of dehiscence.  There is no palpable fluid.  This is not tender to palpation.  There is mild surrounding erythema but no active drainage able to be expressed.  There is a palpable knot below area of concern.  Assessment/ Plan: 69 y.o. female   1. Wound dehiscence Empiric treatment with antibiotics given  surrounding erythema.  I have placed her on a regimen of Xeroform bandages daily.  Avoid Neosporin.  If continues to reopen and/or drain, may need to seek reevaluation with surgery - doxycycline (VIBRA-TABS) 100 MG tablet; Take 1 tablet (100 mg total) by mouth 2 (two) times daily for 10 days.  Dispense: 20 tablet; Refill: 0  2. Soft tissue infection - doxycycline (VIBRA-TABS) 100 MG tablet; Take 1 tablet (100 mg total) by mouth 2 (two) times daily for 10 days.  Dispense: 20 tablet; Refill: 0  3. Overactive bladder - trospium (SANCTURA) 20 MG tablet; Take 1 tablet (20 mg total) by mouth 2 (two) times daily.  Dispense: 180 tablet; Refill: 3  4. Urinary frequency - trospium (SANCTURA) 20 MG tablet; Take 1 tablet (20 mg total) by mouth 2 (two) times daily.  Dispense: 180 tablet; Refill: 3   No orders of the defined types were placed in this encounter.  Meds ordered this encounter  Medications  . doxycycline (VIBRA-TABS) 100 MG tablet    Sig: Take 1 tablet (100 mg total) by mouth 2 (two) times daily for 10 days.    Dispense:  20 tablet    Refill:  Ramos, DO [place] [phone]

## 2020-03-23 DIAGNOSIS — J3089 Other allergic rhinitis: Secondary | ICD-10-CM | POA: Diagnosis not present

## 2020-03-23 DIAGNOSIS — J301 Allergic rhinitis due to pollen: Secondary | ICD-10-CM | POA: Diagnosis not present

## 2020-03-23 DIAGNOSIS — J3081 Allergic rhinitis due to animal (cat) (dog) hair and dander: Secondary | ICD-10-CM | POA: Diagnosis not present

## 2020-03-26 ENCOUNTER — Telehealth: Payer: Self-pay | Admitting: Family Medicine

## 2020-03-26 NOTE — Telephone Encounter (Signed)
lmtcb

## 2020-03-26 NOTE — Telephone Encounter (Signed)
A boot would likely not help this.  She technically can use a normal bandage to cover the xeroform to keep it in place.

## 2020-03-26 NOTE — Telephone Encounter (Signed)
Patient aware and verbalized understanding. Pt states that when she moves her foot it bust open she wants to know what she can do. She is staying off of it as much as she can. What else can you suggest with her.

## 2020-03-26 NOTE — Telephone Encounter (Signed)
The only other recommendation I can give her is to see her surgeon again.  Perhaps they can place a stitch?

## 2020-03-27 NOTE — Telephone Encounter (Signed)
Pt aware of provider feedback and voiced understanding. 

## 2020-03-29 DIAGNOSIS — H524 Presbyopia: Secondary | ICD-10-CM | POA: Diagnosis not present

## 2020-03-31 DIAGNOSIS — Z0100 Encounter for examination of eyes and vision without abnormal findings: Secondary | ICD-10-CM | POA: Diagnosis not present

## 2020-04-03 ENCOUNTER — Encounter: Payer: Self-pay | Admitting: General Surgery

## 2020-04-03 ENCOUNTER — Ambulatory Visit: Payer: Medicare HMO | Admitting: General Surgery

## 2020-04-03 ENCOUNTER — Other Ambulatory Visit: Payer: Self-pay

## 2020-04-03 VITALS — BP 142/81 | HR 56 | Temp 98.5°F | Resp 16 | Ht 69.0 in | Wt 241.0 lb

## 2020-04-03 DIAGNOSIS — T8130XA Disruption of wound, unspecified, initial encounter: Secondary | ICD-10-CM | POA: Diagnosis not present

## 2020-04-03 DIAGNOSIS — T8131XA Disruption of external operation (surgical) wound, not elsewhere classified, initial encounter: Secondary | ICD-10-CM | POA: Insufficient documentation

## 2020-04-03 NOTE — Progress Notes (Signed)
[facility]   HPI:  69 y.o. Female presents to clinic for post-op follow-up evaluation after her left ankle lipoma extension in the office. Unfortunately after this she developed a dehiscence and had some drainage. I was on vacation, and she went to see her PCP and has been seen by them with appropriate management asking patient to limit movement of her foot and bandage. She also has had doxycycline.   Review of Systems:  Drainage from the left ankle ~ 10 days ago No erythema,  All other review of systems: otherwise negative   Vital Signs:  BP (!) 142/81   Pulse (!) 56   Temp 98.5 F (36.9 C) (Oral)   Resp 16   Ht 5\' 9"  (1.753 m)   Wt 241 lb (109.3 kg)   SpO2 92%   BMI 35.59 kg/m    Physical Exam:  Physical Exam Vitals reviewed.  Cardiovascular:     Rate and Rhythm: Normal rate.  Pulmonary:     Effort: Pulmonary effort is normal.  Musculoskeletal:     Comments: Pinpoint area in the inferior part of the lateral ankle incision on the left, no drainage, no tracking, silver nitrate to the skin edges, no erythema   Neurological:     Mental Status: She is alert.       Assessment:  69 y.o. yo Female with a small wound dehiscence that has drained and has had some foul odor that PCP gave antibiotics for. No signs of infection and looks like it will close soon but silver nitrate to the edges to hopefull help aid in epidermis closing.   Plan:  - Neosporin and xeroform over area, coband to help keep in place   - Minimize movement for now of the left ankle  - Will check on wound next week   Future Appointments  Date Time Provider [place]  04/12/2020  1:30 PM Virl Cagey, MD [facility] None  04/30/2020 10:15 AM Janora Norlander, DO [facility] None    Curlene Labrum, MD [facility] [address] [place], [place] [address] [phone] (office)

## 2020-04-03 NOTE — Patient Instructions (Signed)
Small dab neosporin. Cover with xeroform and coband.

## 2020-04-07 DIAGNOSIS — R52 Pain, unspecified: Secondary | ICD-10-CM | POA: Diagnosis not present

## 2020-04-07 DIAGNOSIS — R509 Fever, unspecified: Secondary | ICD-10-CM | POA: Diagnosis not present

## 2020-04-07 DIAGNOSIS — B349 Viral infection, unspecified: Secondary | ICD-10-CM | POA: Diagnosis not present

## 2020-04-07 DIAGNOSIS — R05 Cough: Secondary | ICD-10-CM | POA: Diagnosis not present

## 2020-04-12 ENCOUNTER — Ambulatory Visit: Payer: Medicare HMO | Admitting: General Surgery

## 2020-04-12 ENCOUNTER — Other Ambulatory Visit: Payer: Self-pay

## 2020-04-12 ENCOUNTER — Encounter: Payer: Self-pay | Admitting: General Surgery

## 2020-04-12 ENCOUNTER — Other Ambulatory Visit: Payer: Self-pay | Admitting: Family Medicine

## 2020-04-12 VITALS — BP 111/72 | HR 60 | Temp 98.6°F | Resp 16 | Ht 69.0 in | Wt 241.0 lb

## 2020-04-12 DIAGNOSIS — Z1231 Encounter for screening mammogram for malignant neoplasm of breast: Secondary | ICD-10-CM

## 2020-04-12 DIAGNOSIS — T8130XA Disruption of wound, unspecified, initial encounter: Secondary | ICD-10-CM

## 2020-04-12 NOTE — Patient Instructions (Signed)
Keep strip in place until peeling up on the edges then slowly remove.  Cover with bandaid daily.

## 2020-04-12 NOTE — Progress Notes (Signed)
[facility] Note   HPI:  69 y.o. Female presents to clinic for follow-up evaluation of the left lateral wound dehiscence  Patient reports less drainage. No redness or swelling. Family is worried it is infected. She had a low grade fever to 99 over weekend she reports.  Review of Systems:  Less drainage  All other review of systems: otherwise negative   Vital Signs:  BP 111/72   Pulse 60   Temp 98.6 F (37 C) (Oral)   Resp 16   Ht 5\' 9"  (1.753 m)   Wt 241 lb (109.3 kg)   SpO2 92%   BMI 35.59 kg/m    Physical Exam:  Physical Exam Musculoskeletal:     Comments: Left lateral heel, pinpoint area, silver nitrate applied, no redness or drainage, had brown dry drainage cleared from wound          Assessment:  69 y.o. yo Female with a small wound dehiscence. Improved. No signs of infection.  Plan:  Keep strip in place until peeling up on the edges then slowly remove.  Cover with bandaid daily.   Future Appointments  Date Time Provider [place]  04/19/2020  1:45 PM Virl Cagey, MD [facility] None  04/30/2020 10:15 AM Janora Norlander, DO [facility] None  06/07/2020 11:40 AM [facility] MM 2 [facility]     Curlene Labrum, MD [facility] [address] [place], [place] [address] [phone] (office)

## 2020-04-17 DIAGNOSIS — J3089 Other allergic rhinitis: Secondary | ICD-10-CM | POA: Diagnosis not present

## 2020-04-17 DIAGNOSIS — J301 Allergic rhinitis due to pollen: Secondary | ICD-10-CM | POA: Diagnosis not present

## 2020-04-17 DIAGNOSIS — J3081 Allergic rhinitis due to animal (cat) (dog) hair and dander: Secondary | ICD-10-CM | POA: Diagnosis not present

## 2020-04-19 ENCOUNTER — Ambulatory Visit (INDEPENDENT_AMBULATORY_CARE_PROVIDER_SITE_OTHER): Payer: Medicare HMO | Admitting: General Surgery

## 2020-04-19 ENCOUNTER — Other Ambulatory Visit: Payer: Self-pay

## 2020-04-19 ENCOUNTER — Encounter: Payer: Self-pay | Admitting: General Surgery

## 2020-04-19 VITALS — BP 133/83 | HR 55 | Temp 99.0°F | Resp 16 | Ht 69.0 in | Wt 241.0 lb

## 2020-04-19 DIAGNOSIS — T8130XA Disruption of wound, unspecified, initial encounter: Secondary | ICD-10-CM

## 2020-04-19 DIAGNOSIS — T8130XS Disruption of wound, unspecified, sequela: Secondary | ICD-10-CM

## 2020-04-19 NOTE — Progress Notes (Signed)
[facility] Note   HPI:  69 y.o. Female presents to clinic for follow-up evaluation of the left ankle wound that opened and was draining after lipoma excision. A steri strip has been on 1 week without any drainage. She has no complaints.   Review of Systems:  No further drainage All other review of systems: otherwise negative   Vital Signs:  BP 133/83   Pulse (!) 55   Temp 99 F (37.2 C) (Oral)   Resp 16   Ht 5\' 9"  (1.753 m)   Wt 241 lb (109.3 kg)   SpO2 94%   BMI 35.59 kg/m    Physical Exam:  Physical Exam Vitals reviewed.  Cardiovascular:     Rate and Rhythm: Normal rate.  Pulmonary:     Effort: Pulmonary effort is normal.  Musculoskeletal:     Comments: Left ankle steri strip in place over pinpoint dehiscence that had some drainage, no erythema     Assessment:  69 y.o. yo Female with a minor wound dehiscence that is healing after some silver nitrate and steri strip.  Plan:  - Remove steri strip once peeling on edges  - Follow up PRN    Curlene Labrum, MD [facility] [address] [place], [place] [address] [phone] (office)

## 2020-04-20 ENCOUNTER — Encounter: Payer: Self-pay | Admitting: General Surgery

## 2020-04-23 DIAGNOSIS — R69 Illness, unspecified: Secondary | ICD-10-CM | POA: Diagnosis not present

## 2020-04-30 ENCOUNTER — Ambulatory Visit: Payer: Medicare HMO | Admitting: Family Medicine

## 2020-05-03 DIAGNOSIS — J3089 Other allergic rhinitis: Secondary | ICD-10-CM | POA: Diagnosis not present

## 2020-05-03 DIAGNOSIS — J301 Allergic rhinitis due to pollen: Secondary | ICD-10-CM | POA: Diagnosis not present

## 2020-05-03 DIAGNOSIS — J3081 Allergic rhinitis due to animal (cat) (dog) hair and dander: Secondary | ICD-10-CM | POA: Diagnosis not present

## 2020-05-14 ENCOUNTER — Telehealth: Payer: Self-pay

## 2020-05-14 NOTE — Telephone Encounter (Signed)
Aware she is up to date.

## 2020-05-17 DIAGNOSIS — J3089 Other allergic rhinitis: Secondary | ICD-10-CM | POA: Diagnosis not present

## 2020-05-17 DIAGNOSIS — J3081 Allergic rhinitis due to animal (cat) (dog) hair and dander: Secondary | ICD-10-CM | POA: Diagnosis not present

## 2020-05-17 DIAGNOSIS — J301 Allergic rhinitis due to pollen: Secondary | ICD-10-CM | POA: Diagnosis not present

## 2020-05-29 ENCOUNTER — Ambulatory Visit: Payer: Medicare HMO

## 2020-05-30 ENCOUNTER — Ambulatory Visit (INDEPENDENT_AMBULATORY_CARE_PROVIDER_SITE_OTHER): Payer: Medicare HMO

## 2020-05-30 ENCOUNTER — Other Ambulatory Visit: Payer: Self-pay

## 2020-05-30 DIAGNOSIS — Z23 Encounter for immunization: Secondary | ICD-10-CM

## 2020-05-31 DIAGNOSIS — J3089 Other allergic rhinitis: Secondary | ICD-10-CM | POA: Diagnosis not present

## 2020-05-31 DIAGNOSIS — J301 Allergic rhinitis due to pollen: Secondary | ICD-10-CM | POA: Diagnosis not present

## 2020-05-31 DIAGNOSIS — J3081 Allergic rhinitis due to animal (cat) (dog) hair and dander: Secondary | ICD-10-CM | POA: Diagnosis not present

## 2020-06-01 ENCOUNTER — Other Ambulatory Visit: Payer: Self-pay

## 2020-06-01 ENCOUNTER — Encounter: Payer: Self-pay | Admitting: Family Medicine

## 2020-06-01 ENCOUNTER — Ambulatory Visit (INDEPENDENT_AMBULATORY_CARE_PROVIDER_SITE_OTHER): Payer: Medicare HMO | Admitting: Family Medicine

## 2020-06-01 VITALS — BP 135/65 | HR 65 | Temp 97.1°F | Ht 69.0 in | Wt 242.2 lb

## 2020-06-01 DIAGNOSIS — I10 Essential (primary) hypertension: Secondary | ICD-10-CM

## 2020-06-01 DIAGNOSIS — N3281 Overactive bladder: Secondary | ICD-10-CM

## 2020-06-01 DIAGNOSIS — K219 Gastro-esophageal reflux disease without esophagitis: Secondary | ICD-10-CM | POA: Diagnosis not present

## 2020-06-01 NOTE — Progress Notes (Signed)
Subjective: CC: Follow-up hypertension, GERD PCP: Janora Norlander, DO Kristina Mclaughlin is a 69 y.o. female presenting to clinic today for:  1.  Hypertension Patient is compliant with her antihypertensives.  No chest pain, shortness of breath, edema or falls  2.  GERD Patient reports that she has uncontrolled GERD symptoms with Prilosec.  She has previously tried Nexium, Protonix, Prevacid.  She often will have to cycle through them because they will ultimately become ineffective.  She would like to get back on Protonix if possible.  Denies any nausea, vomiting, hematochezia or melena.  3.  Overactive bladder Patient reports excellent response to [place].  She only has urination once per night now.  Does not report any incontinence episodes.   ROS: Per HPI  Allergies  Allergen Reactions  . Avelox Abc [Moxifloxacin] Anaphylaxis  . Lovenox [Enoxaparin Sodium] Anaphylaxis  . Quinolones Hives and Shortness Of Breath  . Mucinex Sinus-Max [Phenylephrine-Apap-Guaifenesin] Swelling   Past Medical History:  Diagnosis Date  . 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 Essential hypertension 10/29/2015  . Hypertension   . Insomnia     Current Outpatient Medications:  .  albuterol (PROAIR HFA) 108 (90 Base) MCG/ACT inhaler, INHALE 2 PUFFS INTO THE LUNGS EVERY 6 (SIX) HOURS AS NEEDED FOR WHEEZING OR SHORTNESS OF BREATH., Disp: 8 g, Rfl: 2 .  atorvastatin (LIPITOR) 40 MG tablet, Take 1 tablet (40 mg total) by mouth daily., Disp: 90 tablet, Rfl: 3 .  BIOTIN PO, Take 1 capsule by mouth daily., Disp: , Rfl:  .  calcium carbonate (OSCAL) 1500 (600 Ca) MG TABS tablet, Take by mouth., Disp: , Rfl:  .  ciclopirox (PENLAC) 8 % solution, ciclopirox 8 % topical solution  APPLY TO AFFECTED NAILS ONCE DAILY, Disp: , Rfl:  .  diclofenac Sodium (VOLTAREN) 1 % GEL, Apply 4 g topically 4 (four) times daily., Disp: 200 g, Rfl: prn .  EPINEPHrine 0.3 mg/0.3  mL IJ SOAJ injection, See admin instructions., Disp: , Rfl:  .  fluticasone (FLONASE) 50 MCG/ACT nasal spray, PLACE 2 SPRAYS INTO BOTH NOSTRILS DAILY., Disp: 16 g, Rfl: 0 .  levocetirizine (XYZAL) 5 MG tablet, TAKE 1 TABLET ONCE A DAY IN THE EVENING, Disp: , Rfl: 3 .  metoprolol succinate (TOPROL-XL) 25 MG 24 hr tablet, Take 1 tablet (25 mg total) by mouth daily., Disp: 90 tablet, Rfl: 2 .  Multiple Vitamin (MULTI-VITAMIN PO), Take 1 tablet by mouth daily. , Disp: , Rfl:  .  omeprazole (PRILOSEC) 40 MG capsule, Take 1 capsule (40 mg total) by mouth daily., Disp: 90 capsule, Rfl: 3 .  oxyCODONE (ROXICODONE) 5 MG immediate release tablet, Take 1 tablet (5 mg total) by mouth every 4 (four) hours as needed for severe pain or breakthrough pain., Disp: 15 tablet, Rfl: 0 .  Probiotic Product (PROBIOTIC DAILY PO), Take 1 tablet by mouth daily. , Disp: , Rfl:  .  psyllium (METAMUCIL) 58.6 % packet, Take by mouth., Disp: , Rfl:  .  SYMBICORT 80-4.5 MCG/ACT inhaler, SMARTSIG:2 Puff(s) By Mouth Twice Daily, Disp: , Rfl:  .  traZODone (DESYREL) 150 MG tablet, Take 1 tablet (150 mg total) by mouth at bedtime as needed for sleep., Disp: 90 tablet, Rfl: 3 .  trospium (SANCTURA) 20 MG tablet, Take 1 tablet (20 mg total) by mouth 2 (two) times daily., Disp: 180 tablet, Rfl: 3 .  valsartan-hydrochlorothiazide (DIOVAN-HCT) 320-25 MG tablet, Take 1 tablet by mouth daily., Disp:  90 tablet, Rfl: 3 Social History   Socioeconomic History  . Marital status: Married    Spouse name: Not on file  . Number of children: 2  . Years of education: Not on file  . Highest education level: Not on file  Occupational History  . Occupation: Geologist, engineering   Tobacco Use  . Smoking status: Never Smoker  . Smokeless tobacco: Never Used  Vaping Use  . Vaping Use: Never used  Substance and Sexual Activity  . Alcohol use: No    Alcohol/week: 0.0 standard drinks  . Drug use: No  . Sexual activity: Not on file  Other Topics Concern   . 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  She tries to stay active.   Social Determinants of Health   Financial Resource Strain:   . Difficulty of Paying Living Expenses: Not on file  Food Insecurity:   . Worried About Charity fundraiser in the Last Year: Not on file  . Ran Out of Food in the Last Year: Not on file  Transportation Needs:   . Lack of Transportation (Medical): Not on file  . Lack of Transportation (Non-Medical): Not on file  Physical Activity:   . Days of Exercise per Week: Not on file  . Minutes of Exercise per Session: Not on file  Stress:   . Feeling of Stress : Not on file  Social Connections:   . Frequency of Communication with Friends and Family: Not on file  . Frequency of Social Gatherings with Friends and Family: Not on file  . Attends Religious Services: Not on file  . Active Member of Clubs or Organizations: Not on file  . Attends Archivist Meetings: Not on file  . Marital Status: Not on file  Intimate Partner Violence:   . Fear of Current or Ex-Partner: Not on file  . Emotionally Abused: Not on file  . Physically Abused: Not on file  . Sexually Abused: Not on file   Family History  Problem Relation Age of Onset  . Bladder Cancer Mother   . Allergies Mother   . Heart disease Mother   . Allergies Sister   . Alzheimer's disease Father   . Breast cancer Neg Hx     Objective: Office vital signs reviewed. BP 135/65   Pulse 65   Temp (!) 97.1 F (36.2 C)   Ht 5\' 9"  (1.753 m)   Wt 242 lb 3.2 oz (109.9 kg)   SpO2 93%   BMI 35.77 kg/m   Physical Examination:  General: Awake, alert, well nourished, No acute distress HEENT: Normal, sclera white, MMM Cardio: regular rate and rhythm, S1S2 heard, no murmurs appreciated Pulm: clear to auscultation bilaterally, no wheezes, rhonchi or rales; normal work of  breathing on room air Extremities: warm, well perfused, No edema, cyanosis or clubbing; +2 pulses bilaterally MSK: normal gait and station  Assessment/ Plan: 69 y.o. female   Essential hypertension  Gastroesophageal reflux disease without esophagitis - Plan: pantoprazole (PROTONIX) 40 MG tablet  Overactive bladder  Blood pressure well controlled.  Continue current regimen.  I have discontinued the omeprazole and start Protonix instead.  She will contact me if this is ineffective and we could consider trial of Dexilant, which she has not tried yet.  Her overactive the latter is well controlled secure.  No refills needed at this time.  She may follow-up in 6 to 12 months for annual physical exam with fasting labs, sooner if needed  No orders of the defined types were placed in this encounter.  No orders of the defined types were placed in this encounter.    Janora Norlander, DO [place] [phone]

## 2020-06-04 MED ORDER — PANTOPRAZOLE SODIUM 40 MG PO TBEC: 40.0000 mg | DELAYED_RELEASE_TABLET | Freq: Every day | ORAL | 3 refills | Status: DC

## 2020-06-07 ENCOUNTER — Other Ambulatory Visit: Payer: Self-pay

## 2020-06-07 ENCOUNTER — Ambulatory Visit
Admission: RE | Admit: 2020-06-07 | Discharge: 2020-06-07 | Disposition: A | Discharge status: Home / Self Care | Payer: Medicare HMO | Source: Ambulatory Visit | Attending: Family Medicine | Admitting: Family Medicine

## 2020-06-07 DIAGNOSIS — Z1231 Encounter for screening mammogram for malignant neoplasm of breast: Secondary | ICD-10-CM

## 2020-06-11 DIAGNOSIS — J3089 Other allergic rhinitis: Secondary | ICD-10-CM | POA: Diagnosis not present

## 2020-06-11 DIAGNOSIS — J301 Allergic rhinitis due to pollen: Secondary | ICD-10-CM | POA: Diagnosis not present

## 2020-06-11 DIAGNOSIS — J3081 Allergic rhinitis due to animal (cat) (dog) hair and dander: Secondary | ICD-10-CM | POA: Diagnosis not present

## 2020-06-26 DIAGNOSIS — J3081 Allergic rhinitis due to animal (cat) (dog) hair and dander: Secondary | ICD-10-CM | POA: Diagnosis not present

## 2020-06-26 DIAGNOSIS — J301 Allergic rhinitis due to pollen: Secondary | ICD-10-CM | POA: Diagnosis not present

## 2020-06-26 DIAGNOSIS — J3089 Other allergic rhinitis: Secondary | ICD-10-CM | POA: Diagnosis not present

## 2020-07-12 DIAGNOSIS — J3089 Other allergic rhinitis: Secondary | ICD-10-CM | POA: Diagnosis not present

## 2020-07-12 DIAGNOSIS — J3081 Allergic rhinitis due to animal (cat) (dog) hair and dander: Secondary | ICD-10-CM | POA: Diagnosis not present

## 2020-07-12 DIAGNOSIS — J301 Allergic rhinitis due to pollen: Secondary | ICD-10-CM | POA: Diagnosis not present

## 2020-07-25 DIAGNOSIS — J3081 Allergic rhinitis due to animal (cat) (dog) hair and dander: Secondary | ICD-10-CM | POA: Diagnosis not present

## 2020-07-25 DIAGNOSIS — J301 Allergic rhinitis due to pollen: Secondary | ICD-10-CM | POA: Diagnosis not present

## 2020-07-25 DIAGNOSIS — J3089 Other allergic rhinitis: Secondary | ICD-10-CM | POA: Diagnosis not present

## 2020-08-10 DIAGNOSIS — J3081 Allergic rhinitis due to animal (cat) (dog) hair and dander: Secondary | ICD-10-CM | POA: Diagnosis not present

## 2020-08-10 DIAGNOSIS — J3089 Other allergic rhinitis: Secondary | ICD-10-CM | POA: Diagnosis not present

## 2020-08-10 DIAGNOSIS — J301 Allergic rhinitis due to pollen: Secondary | ICD-10-CM | POA: Diagnosis not present

## 2020-08-27 ENCOUNTER — Other Ambulatory Visit: Payer: Medicare HMO

## 2020-08-27 ENCOUNTER — Other Ambulatory Visit: Payer: Self-pay | Admitting: Internal Medicine

## 2020-08-27 DIAGNOSIS — Z20822 Contact with and (suspected) exposure to covid-19: Secondary | ICD-10-CM

## 2020-08-28 ENCOUNTER — Telehealth: Payer: Self-pay

## 2020-08-28 LAB — NOVEL CORONAVIRUS, NAA: SARS-CoV-2, NAA: DETECTED — AB

## 2020-08-28 LAB — SARS-COV-2, NAA 2 DAY TAT

## 2020-08-28 LAB — SPECIMEN STATUS REPORT

## 2020-08-28 NOTE — Telephone Encounter (Signed)
See COVID-19 lab note

## 2020-09-04 ENCOUNTER — Telehealth: Payer: Self-pay

## 2020-09-04 ENCOUNTER — Other Ambulatory Visit: Payer: Self-pay | Admitting: Family Medicine

## 2020-09-04 DIAGNOSIS — K2100 Gastro-esophageal reflux disease with esophagitis, without bleeding: Secondary | ICD-10-CM

## 2020-09-04 NOTE — Telephone Encounter (Signed)
Given ongoing symptoms despite use of Pepcid, Nexium and Prilosec in the past, I think she may benefit from seeing her gastroenterologist.  Is she already established with a GI doctor or she will like to refer her to 1?

## 2020-09-04 NOTE — Telephone Encounter (Signed)
Patient is agreeable to see GI.  She has seen Dr. Wilford Corner at [facility] in the past.

## 2020-09-04 NOTE — Telephone Encounter (Signed)
Have her go up on protonix to twice daily for now (if she is not already doing that) until she is seen by GI

## 2020-09-04 NOTE — Telephone Encounter (Signed)
Patient aware.

## 2020-09-06 DIAGNOSIS — J3089 Other allergic rhinitis: Secondary | ICD-10-CM | POA: Diagnosis not present

## 2020-09-06 DIAGNOSIS — J301 Allergic rhinitis due to pollen: Secondary | ICD-10-CM | POA: Diagnosis not present

## 2020-09-06 DIAGNOSIS — J3081 Allergic rhinitis due to animal (cat) (dog) hair and dander: Secondary | ICD-10-CM | POA: Diagnosis not present

## 2020-09-18 ENCOUNTER — Ambulatory Visit: Payer: Medicare HMO | Admitting: Dermatology

## 2020-09-18 ENCOUNTER — Encounter: Payer: Self-pay | Admitting: Dermatology

## 2020-09-18 ENCOUNTER — Other Ambulatory Visit: Payer: Self-pay

## 2020-09-18 DIAGNOSIS — J3089 Other allergic rhinitis: Secondary | ICD-10-CM | POA: Diagnosis not present

## 2020-09-18 DIAGNOSIS — D1801 Hemangioma of skin and subcutaneous tissue: Secondary | ICD-10-CM | POA: Diagnosis not present

## 2020-09-18 DIAGNOSIS — J301 Allergic rhinitis due to pollen: Secondary | ICD-10-CM | POA: Diagnosis not present

## 2020-09-18 DIAGNOSIS — L821 Other seborrheic keratosis: Secondary | ICD-10-CM

## 2020-09-18 DIAGNOSIS — R238 Other skin changes: Secondary | ICD-10-CM | POA: Diagnosis not present

## 2020-09-18 DIAGNOSIS — Z1283 Encounter for screening for malignant neoplasm of skin: Secondary | ICD-10-CM

## 2020-09-18 DIAGNOSIS — J3081 Allergic rhinitis due to animal (cat) (dog) hair and dander: Secondary | ICD-10-CM | POA: Diagnosis not present

## 2020-09-20 ENCOUNTER — Other Ambulatory Visit: Payer: Self-pay | Admitting: Physician Assistant

## 2020-09-20 DIAGNOSIS — R1011 Right upper quadrant pain: Secondary | ICD-10-CM

## 2020-09-20 DIAGNOSIS — K219 Gastro-esophageal reflux disease without esophagitis: Secondary | ICD-10-CM | POA: Diagnosis not present

## 2020-09-20 DIAGNOSIS — Z8371 Family history of colonic polyps: Secondary | ICD-10-CM | POA: Diagnosis not present

## 2020-09-20 DIAGNOSIS — K449 Diaphragmatic hernia without obstruction or gangrene: Secondary | ICD-10-CM | POA: Diagnosis not present

## 2020-09-20 DIAGNOSIS — K760 Fatty (change of) liver, not elsewhere classified: Secondary | ICD-10-CM | POA: Diagnosis not present

## 2020-09-24 ENCOUNTER — Telehealth: Payer: Self-pay

## 2020-09-24 NOTE — Telephone Encounter (Signed)
Needs appt

## 2020-09-25 ENCOUNTER — Ambulatory Visit: Payer: Medicare HMO | Admitting: Family Medicine

## 2020-09-27 ENCOUNTER — Encounter: Payer: Self-pay | Admitting: Dermatology

## 2020-09-27 NOTE — Progress Notes (Signed)
   Follow-Up Visit   Subjective  Kristina Mclaughlin is a 70 y.o. female who presents for the following: Annual Exam (Isk on right breast?).  Several growths Location:  Duration:  Quality:  Associated Signs/Symptoms: Modifying Factors:  Severity:  Timing: Context: History of atypical nevus  Objective  Well appearing patient in no apparent distress; mood and affect are within normal limits. Objective  Head to toe: Head to toe exam today clear, no signs of atypical moles, melanoma or non mole skin cancer.  Objective  Left Thigh - Anterior: Red 3 mm smooth vascular spot  Objective  Mid [place] Lip: Soft blue for millimeter dermal papule  Objective  Left Lower Leg - Anterior, Left Outer Breast, Right Outer Breast: Brown textured flattopped 5 to 9 mm papules    A full examination was performed including scalp, head, eyes, ears, nose, lips, neck, chest, axillae, abdomen, back, buttocks, bilateral upper extremities, bilateral lower extremities, hands, feet, fingers, toes, fingernails, and toenails. All findings within normal limits unless otherwise noted below.  Areas under undergarments not examined.   Assessment & Plan    Screening exam for skin cancer Head to toe  Yearly skin check, encouraged to self examine twice annually and continue ultraviolet protection.  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  Venous [place] Lip  Intervention not currently necessary  Seborrheic keratosis (3) Left Lower Leg - Anterior; Left Outer Breast; Right Outer Breast  Leave if stable      I, Lavonna Monarch, MD, have reviewed all documentation for this visit.  The documentation on 09/27/20 for the exam, diagnosis, procedures, and orders are all accurate and complete.

## 2020-09-28 DIAGNOSIS — J3089 Other allergic rhinitis: Secondary | ICD-10-CM | POA: Diagnosis not present

## 2020-09-28 DIAGNOSIS — J3081 Allergic rhinitis due to animal (cat) (dog) hair and dander: Secondary | ICD-10-CM | POA: Diagnosis not present

## 2020-09-28 DIAGNOSIS — J301 Allergic rhinitis due to pollen: Secondary | ICD-10-CM | POA: Diagnosis not present

## 2020-09-30 ENCOUNTER — Other Ambulatory Visit: Payer: Self-pay | Admitting: Family Medicine

## 2020-09-30 DIAGNOSIS — I10 Essential (primary) hypertension: Secondary | ICD-10-CM

## 2020-10-01 ENCOUNTER — Ambulatory Visit (INDEPENDENT_AMBULATORY_CARE_PROVIDER_SITE_OTHER): Payer: Medicare HMO

## 2020-10-01 ENCOUNTER — Other Ambulatory Visit: Payer: Self-pay

## 2020-10-01 ENCOUNTER — Encounter: Payer: Self-pay | Admitting: Family

## 2020-10-01 ENCOUNTER — Ambulatory Visit (INDEPENDENT_AMBULATORY_CARE_PROVIDER_SITE_OTHER): Payer: Medicare HMO | Admitting: Family

## 2020-10-01 VITALS — BP 155/76 | HR 63 | Temp 97.2°F | Ht 69.0 in | Wt 240.0 lb

## 2020-10-01 DIAGNOSIS — S8392XA Sprain of unspecified site of left knee, initial encounter: Secondary | ICD-10-CM | POA: Diagnosis not present

## 2020-10-01 DIAGNOSIS — S8992XA Unspecified injury of left lower leg, initial encounter: Secondary | ICD-10-CM | POA: Diagnosis not present

## 2020-10-01 DIAGNOSIS — W108XXA Fall (on) (from) other stairs and steps, initial encounter: Secondary | ICD-10-CM | POA: Diagnosis not present

## 2020-10-01 DIAGNOSIS — S82015A Nondisplaced osteochondral fracture of left patella, initial encounter for closed fracture: Secondary | ICD-10-CM | POA: Diagnosis not present

## 2020-10-01 MED ORDER — PREDNISONE 10 MG (21) PO TBPK: ORAL_TABLET | ORAL | 0 refills | Status: DC

## 2020-10-01 MED ORDER — DICLOFENAC SODIUM 75 MG PO TBEC: 75.0000 mg | DELAYED_RELEASE_TABLET | Freq: Two times a day (BID) | ORAL | 0 refills | Status: DC

## 2020-10-01 NOTE — Progress Notes (Signed)
Subjective:    Patient ID: Kristina Mclaughlin, female    DOB: 1950-12-04, 70 y.o.   MRN: [id number]  Chief Complaint  Patient presents with  . Fall    Feel on ice last night hurt left leg    Pt presents to the office today after a fall last on her steps.  Fall The accident occurred 6 to 12 hours ago. There was no blood loss. The pain is present in the left knee. The pain is at a severity of 8/10. The pain is moderate. The symptoms are aggravated by pressure on injury. Pertinent negatives include no hematuria, loss of consciousness, numbness or visual change. She has tried ice, rest and acetaminophen for the symptoms. The treatment provided mild relief.      Review of Systems  Genitourinary: Negative for hematuria.  Neurological: Negative for loss of consciousness and numbness.  All other systems reviewed and are negative.      Objective:   Physical Exam Vitals reviewed.  Constitutional:      General: She is not in acute distress.    Appearance: She is well-developed and well-nourished.  HENT:     Head: Normocephalic and atraumatic.     Mouth/Throat:     Mouth: Oropharynx is clear and moist.  Eyes:     Pupils: Pupils are equal, round, and reactive to light.  Neck:     Thyroid: No thyromegaly.  Cardiovascular:     Rate and Rhythm: Normal rate and regular rhythm.     Pulses: Intact distal pulses.     Heart sounds: Normal heart sounds. No murmur heard.   Pulmonary:     Effort: Pulmonary effort is normal. No respiratory distress.     Breath sounds: Normal breath sounds. No wheezing.  Abdominal:     General: Bowel sounds are normal. There is no distension.     Palpations: Abdomen is soft.     Tenderness: There is no abdominal tenderness.  Musculoskeletal:        General: Tenderness present. No edema.     Cervical back: Normal range of motion and neck supple.     Comments: Left knee medial swelling, pain in knee flexion and extension  Skin:    General: Skin is  warm and dry.  Neurological:     Mental Status: She is alert and oriented to person, place, and time.     Cranial Nerves: No cranial nerve deficit.     Deep Tendon Reflexes: Reflexes are normal and symmetric.  Psychiatric:        Mood and Affect: Mood and affect normal.        Behavior: Behavior normal.        Thought Content: Thought content normal.        Judgment: Judgment normal.    X-ray knee- negative, Preliminary reading by Evelina Dun, FNP [facility]   BP (!) 155/76   Pulse 63   Temp (!) 97.2 F (36.2 C) (Temporal)   Ht 5\' 9"  (1.753 m)   Wt 240 lb (108.9 kg)   BMI 35.44 kg/m      Assessment & Plan:  1. Injury of left knee, initial encounter - DG Knee 1-2 Views Left; Future - diclofenac (VOLTAREN) 75 MG EC tablet; Take 1 tablet (75 mg total) by mouth 2 (two) times daily.  Dispense: 40 tablet; Refill: 0 - predniSONE (STERAPRED UNI-PAK 21 TAB) 10 MG (21) TBPK tablet; Use as directed  Dispense: 21 tablet; Refill: 0  2. Fall on  steps, initial encounter - diclofenac (VOLTAREN) 75 MG EC tablet; Take 1 tablet (75 mg total) by mouth 2 (two) times daily.  Dispense: 40 tablet; Refill: 0 - predniSONE (STERAPRED UNI-PAK 21 TAB) 10 MG (21) TBPK tablet; Use as directed  Dispense: 21 tablet; Refill: 0  3. Sprain of left knee, unspecified ligament, initial encounter Rest Ice Compression  Elevation  Report if symptoms worsen or do not improve, may need CT scan or referral to Ortho - diclofenac (VOLTAREN) 75 MG EC tablet; Take 1 tablet (75 mg total) by mouth 2 (two) times daily.  Dispense: 40 tablet; Refill: 0 - predniSONE (STERAPRED UNI-PAK 21 TAB) 10 MG (21) TBPK tablet; Use as directed  Dispense: 21 tablet; Refill: 0   Evelina Dun, FNP

## 2020-10-01 NOTE — Patient Instructions (Signed)
Knee Sprain, Adult  A knee sprain is a stretch or tear in a knee ligament. Knee ligaments are tissues that connect bones in the knee to each other. What are the causes? This condition often results from:  A fall.  An injury to the knee. What are the signs or symptoms? Symptoms of this condition include:  Trouble straightening or bending the leg.  Swelling in the knee.  Bruising around the knee.  Tenderness or pain in the knee.  Muscle spasms around the knee. How is this diagnosed? This condition may be diagnosed based on:  A physical exam.  A history of what happened just before you started to have symptoms.  Tests, including: ? An X-ray. This may be done to make sure no bones are broken. ? An MRI. This may be done to check if the ligament is torn. ? Stress testing of the knee. This may be done to check ligament damage. How is this treated? Treatment for this condition may involve:  Keeping the knee still (immobilized) with a cast, brace, or splint.  Applying ice to the knee. This helps with pain and swelling.  Raising (elevating) the knee above the level of your heart when you are resting. This helps with pain and swelling.  Taking medicine for pain.  Doing exercises to prevent or limit permanent weakness or stiffness in your knee.  Having surgery to reconnect the ligament to the bone or to reconstruct it. This may be needed if the ligament is completely torn. Follow these instructions at home: If you have a splint or brace:  Wear it as told by your health care provider. Remove it only as told by your health care provider.  Check the skin around it every day. Tell your health care provider about any concerns.  Loosen it if your toes tingle, become numb, or turn cold and blue.  Keep it clean and dry. If you have a cast:  Do not stick anything inside it to scratch your skin. Doing that increases your risk of infection.  Check the skin around it every day.  Tell your health care provider about any concerns.  You may put lotion on dry skin around the edges of the cast. Do not put lotion on the skin underneath the cast.  Keep it clean and dry. Bathing If you have a splint, brace, or cast that is not waterproof:  Do not let it get wet.  Cover it with a watertight covering when you take a bath or a shower. Managing pain, stiffness, and swelling  If directed, put ice on the injured area. To do this: ? If you have a removable splint or brace, remove it as told by your health care provider. ? Put ice in a plastic bag. ? Place a towel between your skin and the bag or between your cast and the bag. ? Leave the ice on for 20 minutes, 2-3 times a day.  Move your toes often to reduce stiffness and swelling.  Elevate the injured area above the level of your heart while you are sitting or lying down.   General instructions  Take over-the-counter and prescription medicines only as told by your health care provider.  Do not use any products that contain nicotine or tobacco, such as cigarettes, e-cigarettes, and chewing tobacco. These can delay healing. If you need help quitting, ask your health care provider.  Do exercises as told by your health care provider.  Keep all follow-up visits as told by your  health care provider. This is important. Contact a health care provider if:  You have pain that gets worse.  The cast, brace, or splint does not fit right.  The cast, brace, or splint gets damaged. Get help right away if:  You cannot use your injured knee to support any of your body weight (cannot bear weight).  You cannot move the injured joint.  You cannot walk more than a few steps without pain or without your knee buckling.  You have significant pain, swelling, or numbness in the leg below the cast, brace, or splint.  Your foot or toes are numb, cold, or blue after loosening your splint or brace. Summary  A knee sprain is a stretch  or tear in a knee ligament that usually occurs as the result of a fall or injury.  Treatment may involve immobilizing the knee with a cast, splint, or brace and then doing exercises.  If the ligament is completely torn, it may require surgery to repair or replace the injured ligament. This information is not intended to replace advice given to you by your health care provider. Make sure you discuss any questions you have with your health care provider. Document Revised: 06/10/2019 Document Reviewed: 06/10/2019 Elsevier Patient Education  [place].

## 2020-10-02 ENCOUNTER — Other Ambulatory Visit: Payer: Self-pay | Admitting: Family

## 2020-10-02 DIAGNOSIS — S82832A Other fracture of upper and lower end of left fibula, initial encounter for closed fracture: Secondary | ICD-10-CM

## 2020-10-03 DIAGNOSIS — J301 Allergic rhinitis due to pollen: Secondary | ICD-10-CM | POA: Diagnosis not present

## 2020-10-03 DIAGNOSIS — M25562 Pain in left knee: Secondary | ICD-10-CM | POA: Diagnosis not present

## 2020-10-03 DIAGNOSIS — J3081 Allergic rhinitis due to animal (cat) (dog) hair and dander: Secondary | ICD-10-CM | POA: Diagnosis not present

## 2020-10-03 DIAGNOSIS — M238X2 Other internal derangements of left knee: Secondary | ICD-10-CM | POA: Diagnosis not present

## 2020-10-03 DIAGNOSIS — J3089 Other allergic rhinitis: Secondary | ICD-10-CM | POA: Diagnosis not present

## 2020-10-04 ENCOUNTER — Other Ambulatory Visit: Payer: Medicare HMO

## 2020-10-08 ENCOUNTER — Ambulatory Visit
Admission: RE | Admit: 2020-10-08 | Discharge: 2020-10-08 | Disposition: A | Discharge status: Home / Self Care | Payer: Medicare HMO | Source: Ambulatory Visit | Attending: Physician Assistant | Admitting: Physician Assistant

## 2020-10-08 DIAGNOSIS — R1031 Right lower quadrant pain: Secondary | ICD-10-CM | POA: Diagnosis not present

## 2020-10-08 DIAGNOSIS — R1011 Right upper quadrant pain: Secondary | ICD-10-CM | POA: Diagnosis not present

## 2020-10-17 DIAGNOSIS — J301 Allergic rhinitis due to pollen: Secondary | ICD-10-CM | POA: Diagnosis not present

## 2020-10-17 DIAGNOSIS — J3081 Allergic rhinitis due to animal (cat) (dog) hair and dander: Secondary | ICD-10-CM | POA: Diagnosis not present

## 2020-10-17 DIAGNOSIS — S83242D Other tear of medial meniscus, current injury, left knee, subsequent encounter: Secondary | ICD-10-CM | POA: Diagnosis not present

## 2020-10-17 DIAGNOSIS — J3089 Other allergic rhinitis: Secondary | ICD-10-CM | POA: Diagnosis not present

## 2020-10-25 DIAGNOSIS — J301 Allergic rhinitis due to pollen: Secondary | ICD-10-CM | POA: Diagnosis not present

## 2020-10-25 DIAGNOSIS — K760 Fatty (change of) liver, not elsewhere classified: Secondary | ICD-10-CM | POA: Diagnosis not present

## 2020-10-25 DIAGNOSIS — J45991 Cough variant asthma: Secondary | ICD-10-CM | POA: Diagnosis not present

## 2020-10-25 DIAGNOSIS — J3089 Other allergic rhinitis: Secondary | ICD-10-CM | POA: Diagnosis not present

## 2020-10-25 DIAGNOSIS — J3081 Allergic rhinitis due to animal (cat) (dog) hair and dander: Secondary | ICD-10-CM | POA: Diagnosis not present

## 2020-10-30 ENCOUNTER — Ambulatory Visit (INDEPENDENT_AMBULATORY_CARE_PROVIDER_SITE_OTHER): Payer: Medicare HMO | Admitting: Family Medicine

## 2020-10-30 ENCOUNTER — Other Ambulatory Visit: Payer: Self-pay | Admitting: Family Medicine

## 2020-10-30 ENCOUNTER — Other Ambulatory Visit: Payer: Self-pay | Admitting: Family

## 2020-10-30 ENCOUNTER — Other Ambulatory Visit: Payer: Self-pay

## 2020-10-30 ENCOUNTER — Encounter: Payer: Self-pay | Admitting: Family Medicine

## 2020-10-30 VITALS — BP 192/88 | HR 77 | Temp 98.5°F | Ht 69.0 in | Wt 247.2 lb

## 2020-10-30 DIAGNOSIS — H9192 Unspecified hearing loss, left ear: Secondary | ICD-10-CM

## 2020-10-30 DIAGNOSIS — J4541 Moderate persistent asthma with (acute) exacerbation: Secondary | ICD-10-CM | POA: Diagnosis not present

## 2020-10-30 DIAGNOSIS — I10 Essential (primary) hypertension: Secondary | ICD-10-CM

## 2020-10-30 DIAGNOSIS — S8992XA Unspecified injury of left lower leg, initial encounter: Secondary | ICD-10-CM

## 2020-10-30 DIAGNOSIS — S8392XA Sprain of unspecified site of left knee, initial encounter: Secondary | ICD-10-CM

## 2020-10-30 DIAGNOSIS — E781 Pure hyperglyceridemia: Secondary | ICD-10-CM

## 2020-10-30 DIAGNOSIS — W108XXA Fall (on) (from) other stairs and steps, initial encounter: Secondary | ICD-10-CM

## 2020-10-30 DIAGNOSIS — F5101 Primary insomnia: Secondary | ICD-10-CM

## 2020-10-30 MED ORDER — METHYLPREDNISOLONE ACETATE 40 MG/ML IJ SUSP
80.0000 mg | Freq: Once | INTRAMUSCULAR | Status: AC
  Administered 2020-10-30: 80 mg via INTRAMUSCULAR

## 2020-10-30 MED ORDER — AMLODIPINE BESYLATE 5 MG PO TABS: 5.0000 mg | ORAL_TABLET | Freq: Every day | ORAL | 3 refills | Status: DC

## 2020-10-30 MED ORDER — DOXYCYCLINE HYCLATE 100 MG PO TABS: 100.0000 mg | ORAL_TABLET | Freq: Two times a day (BID) | ORAL | 0 refills | Status: AC

## 2020-10-30 MED ORDER — BENZONATATE 100 MG PO CAPS: 100.0000 mg | ORAL_CAPSULE | Freq: Three times a day (TID) | ORAL | 0 refills | Status: DC | PRN

## 2020-10-30 MED ORDER — METHYLPREDNISOLONE ACETATE 80 MG/ML IJ SUSP: 80.0000 mg | Freq: Once | INTRAMUSCULAR | Status: DC

## 2020-10-30 NOTE — Patient Instructions (Addendum)
Cut metoprolol in half and take only half for 2 days.  Then take 1/4 tablet for 2 days then stop.  Ok to Start amlodipine during taper from metoprolol.    See nurse in 2 weeks for BP recheck.  Goal is <150/90.  Antibiotic and cough medication sent to pharmacy. If symptoms do not improve, recommend reevaluation with your asthma/ allergy doctor.  May need to escalate treatment.

## 2020-10-30 NOTE — Progress Notes (Signed)
Subjective: CC: Hypertension PCP: Janora Norlander, DO WJX:BJYNWGN Kristina Mclaughlin is a 70 y.o. female presenting to clinic today for:  1.  Hypertension/asthma exacerbation Patient would like to switch off of Toprol due to her risk of anaphylaxis and possible need for EpiPen.  She worries that this will be ineffective due to use of beta-blocker.  She is currently treated with a ARB/diuretic and beta-blocker as above.  No reports of chest pain, visual disturbance.  She is had some shortness of breath and wheezing and thinks that she has an ongoing bronchitis.  She was treated with prednisone about 2 to 3 weeks ago.  She is been using her albuterol inhaler and Symbicort around-the-clock.  No fevers.  No hemoptysis.  She was recently switched to a different antihistamine by her asthma/allergy specialist.  2.  Decreased hearing Patient reports that she thinks she had COVID-19 in February she had decreased hearing out of the left ear.  No dizziness or drainage from the ear.  She uses an oral antihistamine as above.   ROS: Per HPI  Allergies  Allergen Reactions  . Avelox Abc [Moxifloxacin] Anaphylaxis  . Lovenox [Enoxaparin Sodium] Anaphylaxis  . Quinolones Hives and Shortness Of Breath  . Mucinex Sinus-Max [Phenylephrine-Apap-Guaifenesin] Swelling   Past Medical History:  Diagnosis Date  . 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 Essential hypertension 10/29/2015  . Hypertension   . Insomnia     Current Outpatient Medications:  .  albuterol (PROAIR HFA) 108 (90 Base) MCG/ACT inhaler, INHALE 2 PUFFS INTO THE LUNGS EVERY 6 (SIX) HOURS AS NEEDED FOR WHEEZING OR SHORTNESS OF BREATH., Disp: 8 g, Rfl: 2 .  atorvastatin (LIPITOR) 40 MG tablet, Take 1 tablet (40 mg total) by mouth daily., Disp: 90 tablet, Rfl: 3 .  BIOTIN PO, Take 1 capsule by mouth daily., Disp: , Rfl:  .  calcium carbonate (OSCAL) 1500 (600 Ca) MG TABS tablet, Take by mouth., Disp: ,  Rfl:  .  diclofenac (VOLTAREN) 75 MG EC tablet, Take 1 tablet (75 mg total) by mouth 2 (two) times daily., Disp: 40 tablet, Rfl: 0 .  diclofenac Sodium (VOLTAREN) 1 % GEL, Apply 4 g topically 4 (four) times daily., Disp: 200 g, Rfl: prn .  EPINEPHrine 0.3 mg/0.3 mL IJ SOAJ injection, See admin instructions., Disp: , Rfl:  .  fluticasone (FLONASE) 50 MCG/ACT nasal spray, PLACE 2 SPRAYS INTO BOTH NOSTRILS DAILY., Disp: 16 g, Rfl: 0 .  levocetirizine (XYZAL) 5 MG tablet, TAKE 1 TABLET ONCE A DAY IN THE EVENING, Disp: , Rfl: 3 .  metoprolol succinate (TOPROL-XL) 25 MG 24 hr tablet, TAKE 1 TABLET BY MOUTH EVERY DAY, Disp: 90 tablet, Rfl: 0 .  Multiple Vitamin (MULTI-VITAMIN PO), Take 1 tablet by mouth daily. , Disp: , Rfl:  .  pantoprazole (PROTONIX) 40 MG tablet, Take 1 tablet (40 mg total) by mouth daily., Disp: 90 tablet, Rfl: 3 .  predniSONE (STERAPRED UNI-PAK 21 TAB) 10 MG (21) TBPK tablet, Use as directed, Disp: 21 tablet, Rfl: 0 .  Probiotic Product (PROBIOTIC DAILY PO), Take 1 tablet by mouth daily. , Disp: , Rfl:  .  psyllium (METAMUCIL) 58.6 % packet, Take by mouth., Disp: , Rfl:  .  SYMBICORT 80-4.5 MCG/ACT inhaler, SMARTSIG:2 Puff(s) By Mouth Twice Daily, Disp: , Rfl:  .  traZODone (DESYREL) 150 MG tablet, Take 1 tablet (150 mg total) by mouth at bedtime as needed for sleep., Disp: 90 tablet, Rfl: 3 .  trospium (SANCTURA) 20 MG tablet, Take 1 tablet (20 mg total) by mouth 2 (two) times daily., Disp: 180 tablet, Rfl: 3 .  valsartan-hydrochlorothiazide (DIOVAN-HCT) 320-25 MG tablet, Take 1 tablet by mouth daily., Disp: 90 tablet, Rfl: 3 Social History   Socioeconomic History  . Marital status: Married    Spouse name: Not on file  . Number of children: 2  . Years of education: Not on file  . Highest education level: Not on file  Occupational History  . Occupation: Geologist, engineering   Tobacco Use  . Smoking status: Never Smoker  . Smokeless tobacco: Never Used  Vaping Use  . Vaping Use:  Never used  Substance and Sexual Activity  . Alcohol use: No    Alcohol/week: 0.0 standard drinks  . Drug use: No  . Sexual activity: Not on file  Other Topics Concern  . 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  She tries to stay active.   Social Determinants of Health   Financial Resource Strain: Not on file  Food Insecurity: Not on file  Transportation Needs: Not on file  Physical Activity: Not on file  Stress: Not on file  Social Connections: Not on file  Intimate Partner Violence: Not on file   Family History  Problem Relation Age of Onset  . Bladder Cancer Mother   . Allergies Mother   . Heart disease Mother   . Allergies Sister   . Alzheimer's disease Father   . Breast cancer Neg Hx     Objective: Office vital signs reviewed. BP (!) 192/88   Pulse 77   Temp 98.5 F (36.9 C) (Temporal)   Ht 5\' 9"  (1.753 m)   Wt 247 lb 3.2 oz (112.1 kg)   SpO2 92%   BMI 36.51 kg/m   Physical Examination:  General: Awake, alert, well nourished, No acute distress HEENT: Normal; TMs are intact with no appreciable effusions or bulging. Cardio: regular rate and rhythm, S1S2 heard, no murmurs appreciated Pulm: Globally decreased breath sounds with no appreciable wheezes, rhonchi or rales.  She has normal work of breathing on room air.  She is coughing intermittently during exam. MSK: Has a leg brace in place on the left.  Gait is antalgic.  Requiring crutches.  Hearing: Left only hears 200HZ   Assessment/ Plan: 70 y.o. female   Decreased hearing, left - Plan: Ambulatory referral to ENT  Moderate persistent asthma with exacerbation - Plan: doxycycline (VIBRA-TABS) 100 MG tablet, benzonatate (TESSALON PERLES) 100 MG capsule, methylPREDNISolone acetate (DEPO-MEDROL) injection 80 mg, DISCONTINUED: methylPREDNISolone acetate (DEPO-MEDROL) injection 80  mg  Essential hypertension - Plan: amLODipine (NORVASC) 5 MG tablet  Hearing screen failed hearing office on the left side.  Given unilateral nature, I have placed a referral to [facility].  I believe they also have audiology there as well.  Her lung exam did sound tight today.  Given longevity of symptoms, she was given a Depo-Medrol intramuscularly and placed on oral antibiotics.  She is to continue the medications as prescribed for her asthma and allergy.  If symptoms are persistent, I would highly recommend that she follow-up with her asthma/allergy specialist.  I wonder if perhaps she would need her medications advanced.  Blood pressure was not controlled.  Plans for taper off of the Toprol given increased risk of severe anaphylaxis due to inadequate response to epinephrine  if needed.  I advised against the probably discontinuing the beta-blocker.  We will cross taper with Norvasc 5 mg daily.  She will follow-up in the next 2 weeks for blood pressure recheck with nurse.  Goal blood pressure less than 150/90.  No orders of the defined types were placed in this encounter.  No orders of the defined types were placed in this encounter.    Janora Norlander, DO [place] [phone]

## 2020-10-31 ENCOUNTER — Telehealth: Payer: Self-pay | Admitting: *Deleted

## 2020-10-31 NOTE — Telephone Encounter (Signed)
Fax from [facility] Benzonatate 100 mg cap not covered by insurance; requesting covered alternative Please advise

## 2020-11-01 DIAGNOSIS — S83412D Sprain of medial collateral ligament of left knee, subsequent encounter: Secondary | ICD-10-CM | POA: Diagnosis not present

## 2020-11-06 DIAGNOSIS — K219 Gastro-esophageal reflux disease without esophagitis: Secondary | ICD-10-CM | POA: Diagnosis not present

## 2020-11-06 DIAGNOSIS — J45909 Unspecified asthma, uncomplicated: Secondary | ICD-10-CM | POA: Diagnosis not present

## 2020-11-06 DIAGNOSIS — K5900 Constipation, unspecified: Secondary | ICD-10-CM | POA: Diagnosis not present

## 2020-11-06 DIAGNOSIS — G4700 Insomnia, unspecified: Secondary | ICD-10-CM | POA: Diagnosis not present

## 2020-11-06 DIAGNOSIS — G8929 Other chronic pain: Secondary | ICD-10-CM | POA: Diagnosis not present

## 2020-11-06 DIAGNOSIS — R32 Unspecified urinary incontinence: Secondary | ICD-10-CM | POA: Diagnosis not present

## 2020-11-06 DIAGNOSIS — I10 Essential (primary) hypertension: Secondary | ICD-10-CM | POA: Diagnosis not present

## 2020-11-06 DIAGNOSIS — E785 Hyperlipidemia, unspecified: Secondary | ICD-10-CM | POA: Diagnosis not present

## 2020-11-06 DIAGNOSIS — M1990 Unspecified osteoarthritis, unspecified site: Secondary | ICD-10-CM | POA: Diagnosis not present

## 2020-11-08 DIAGNOSIS — J301 Allergic rhinitis due to pollen: Secondary | ICD-10-CM | POA: Diagnosis not present

## 2020-11-08 DIAGNOSIS — J3081 Allergic rhinitis due to animal (cat) (dog) hair and dander: Secondary | ICD-10-CM | POA: Diagnosis not present

## 2020-11-08 DIAGNOSIS — J3089 Other allergic rhinitis: Secondary | ICD-10-CM | POA: Diagnosis not present

## 2020-11-09 ENCOUNTER — Telehealth: Payer: Self-pay

## 2020-11-30 DIAGNOSIS — J301 Allergic rhinitis due to pollen: Secondary | ICD-10-CM | POA: Diagnosis not present

## 2020-11-30 DIAGNOSIS — J3089 Other allergic rhinitis: Secondary | ICD-10-CM | POA: Diagnosis not present

## 2020-11-30 DIAGNOSIS — J3081 Allergic rhinitis due to animal (cat) (dog) hair and dander: Secondary | ICD-10-CM | POA: Diagnosis not present

## 2020-11-30 DIAGNOSIS — S83412D Sprain of medial collateral ligament of left knee, subsequent encounter: Secondary | ICD-10-CM | POA: Diagnosis not present

## 2020-12-04 DIAGNOSIS — R12 Heartburn: Secondary | ICD-10-CM | POA: Diagnosis not present

## 2020-12-04 DIAGNOSIS — K449 Diaphragmatic hernia without obstruction or gangrene: Secondary | ICD-10-CM | POA: Diagnosis not present

## 2020-12-04 DIAGNOSIS — K219 Gastro-esophageal reflux disease without esophagitis: Secondary | ICD-10-CM | POA: Diagnosis not present

## 2020-12-04 DIAGNOSIS — K3189 Other diseases of stomach and duodenum: Secondary | ICD-10-CM | POA: Diagnosis not present

## 2020-12-04 DIAGNOSIS — K2930 Chronic superficial gastritis without bleeding: Secondary | ICD-10-CM | POA: Diagnosis not present

## 2020-12-05 ENCOUNTER — Telehealth: Payer: Self-pay

## 2020-12-05 DIAGNOSIS — K219 Gastro-esophageal reflux disease without esophagitis: Secondary | ICD-10-CM

## 2020-12-05 MED ORDER — PANTOPRAZOLE SODIUM 40 MG PO TBEC: 40.0000 mg | DELAYED_RELEASE_TABLET | Freq: Every day | ORAL | 1 refills | Status: DC

## 2020-12-05 NOTE — Telephone Encounter (Signed)
Prescription sent to pharmacy, patient aware 

## 2020-12-05 NOTE — Telephone Encounter (Signed)
Pt had apt with Dr Thera Flake yesterday and he advised pt to continue pantoprazole (PROTONIX) 40 MG tablet. 2x with 90 day supply. Use [facility]

## 2020-12-10 DIAGNOSIS — K2930 Chronic superficial gastritis without bleeding: Secondary | ICD-10-CM | POA: Diagnosis not present

## 2020-12-11 DIAGNOSIS — H9042 Sensorineural hearing loss, unilateral, left ear, with unrestricted hearing on the contralateral side: Secondary | ICD-10-CM | POA: Insufficient documentation

## 2020-12-13 DIAGNOSIS — J3081 Allergic rhinitis due to animal (cat) (dog) hair and dander: Secondary | ICD-10-CM | POA: Diagnosis not present

## 2020-12-13 DIAGNOSIS — J3089 Other allergic rhinitis: Secondary | ICD-10-CM | POA: Diagnosis not present

## 2020-12-13 DIAGNOSIS — J301 Allergic rhinitis due to pollen: Secondary | ICD-10-CM | POA: Diagnosis not present

## 2020-12-21 ENCOUNTER — Other Ambulatory Visit: Payer: Self-pay | Admitting: Family Medicine

## 2020-12-21 ENCOUNTER — Telehealth: Payer: Self-pay | Admitting: Family Medicine

## 2020-12-21 DIAGNOSIS — K219 Gastro-esophageal reflux disease without esophagitis: Secondary | ICD-10-CM

## 2020-12-21 MED ORDER — PANTOPRAZOLE SODIUM 40 MG PO TBEC
40.0000 mg | DELAYED_RELEASE_TABLET | Freq: Two times a day (BID) | ORAL | 1 refills | Status: DC
Start: 2020-12-21 — End: 2021-07-15

## 2020-12-21 NOTE — Telephone Encounter (Signed)
Pt aware.

## 2020-12-21 NOTE — Telephone Encounter (Signed)
Increased from telephone call on 09/04/20, needs new Rx sent to [facility] so that they have it on file

## 2020-12-21 NOTE — Telephone Encounter (Signed)
LMOVM Rx sent to pharmacy 

## 2020-12-21 NOTE — Telephone Encounter (Signed)
  Prescription Request  12/21/2020  What is the name of the medication or equipment? Dr Michail Sermon changed her protonix from 1 a day to 2 a day. She needs new rx called in  Have you contacted your pharmacy to request a refill? (if applicable) no  [facility]   Patient notified that their request is being sent to the clinical staff for review and that they should receive a response within 2 business days.

## 2020-12-21 NOTE — Telephone Encounter (Signed)
done

## 2020-12-26 ENCOUNTER — Other Ambulatory Visit: Payer: Self-pay | Admitting: Family Medicine

## 2020-12-26 DIAGNOSIS — I10 Essential (primary) hypertension: Secondary | ICD-10-CM

## 2020-12-27 ENCOUNTER — Other Ambulatory Visit: Payer: Self-pay

## 2020-12-27 ENCOUNTER — Ambulatory Visit (INDEPENDENT_AMBULATORY_CARE_PROVIDER_SITE_OTHER): Payer: Medicare HMO | Admitting: Family

## 2020-12-27 ENCOUNTER — Encounter: Payer: Self-pay | Admitting: Family

## 2020-12-27 VITALS — BP 139/74 | HR 89 | Temp 97.3°F | Wt 247.0 lb

## 2020-12-27 DIAGNOSIS — R42 Dizziness and giddiness: Secondary | ICD-10-CM

## 2020-12-27 DIAGNOSIS — R519 Headache, unspecified: Secondary | ICD-10-CM | POA: Diagnosis not present

## 2020-12-27 DIAGNOSIS — G43109 Migraine with aura, not intractable, without status migrainosus: Secondary | ICD-10-CM

## 2020-12-27 NOTE — Patient Instructions (Signed)
Chronic Migraine Headache A migraine is a type of headache that is usually stronger and more sudden than other headaches. Migraines are characterized by an intense pulsing, throbbing pain that is usually only present on one side of the head. Migraine pain usually gets worse with activity. Migraines can cause nausea, vomiting, sensitivity to light and sound, and vision changes. Migraines that keep coming back are called recurrent migraines. A migraine is called a chronic migraine if it happens at least 15 days in a month for more than 3 months. Talk with your health care provider about what things may bring on (trigger) your migraines. What are the causes? The exact cause of this condition is not known. However, a migraine may be caused when nerves in the brain become irritated and release chemicals that cause inflammation of blood vessels. The inflammation of the blood vessels causes pain. Migraines may be triggered or caused by:  Smoking.  Certain foods and drinks, such as: ? Aged cheese. ? Chocolate. ? Alcohol. ? Caffeine. ? Foods or drinks that contain nitrates, glutamate, aspartame, MSG, or tyramine.  Medicines, such as birth control pills or some blood pressure medicines. Other things that may trigger a migraine include:  Menstruation.  Emotional stress.  Lack of sleep or too much sleep.  Tiredness (fatigue).  Bright lights or loud noises.  Odors.  Weather changes and high altitude. What increases the risk? The following factors may make you more likely to experience chronic migraine:  Having migraines or a family history of migraines.  Having a mental health condition, such as depression or anxiety.  Having to take a lot of pain medicine.  Having sleep problems.  Having heart disease, diabetes, or obesity. What are the signs or symptoms? Symptoms of a migraine vary for each person and may include:  Pulsating or throbbing pain.  Pain that is usually only present  on one side of the head. In some cases, the pain may be on both sides of the head or around the head or neck.  Severe pain that prevents you from doing daily activities.  Pain that gets worse with physical activity.  Nausea, vomiting, or both.  Pain with exposure to bright lights, loud noises, or activity.  General sensitivity to bright lights, loud noises, or smells.  Dizziness. A sign that a migraine is becoming chronic is an increasing number of migraine episodes. It is considered chronic if the migraine happens at least 15 days in a month for more than 3 months. How is this diagnosed? This condition is often diagnosed based on:  Your symptoms and medical history.  A physical exam. You may also have tests, including:  A CT scan or an MRI of your brain. These imaging tests cannot diagnose migraines, but they can help to rule out other causes of headaches.  Taking fluid from the spine (lumbar puncture) and analyzing it (cerebrospinal fluid analysis, or CSF analysis).  Blood tests. How is this treated? This condition is treated with:  Medicines. These help to: ? Lessen pain and nausea. ? Prevent migraines.  Lifestyle changes, such as changes to your diet or sleeping patterns.  Behavior therapy. This may include: ? Relaxation training. ? Biofeedback. This is a treatment that teaches you to relax and use your brain to lower your heart rate and control your breathing. ? Cognitive behavioral therapy (CBT). This is a form of talk therapy. This therapy helps you set goals and follow up on the changes that you make.  Acupuncture.  Using   a device that provides electrical stimulation to your nerves, which can relieve pain (neuromodulation therapy).  Surgery, if the other treatments are not working. Follow these instructions at home: Medicines  Take over-the-counter and prescription medicines only as told by your health care provider.  Ask your health care provider if the  medicine prescribed to you requires you to avoid driving or using machinery. Lifestyle  Do not use any products that contain nicotine or tobacco, such as cigarettes, e-cigarettes, and chewing tobacco. If you need help quitting, ask your health care provider.  Do not drink alcohol.  Get 7-9 hours of sleep each night, or the amount of sleep recommended by your health care provider.  Find ways to manage stress, such as meditation, deep breathing, or yoga.  Maintain a healthy weight. If you need help losing weight, ask your health care provider.  Exercise regularly. Aim for 150 minutes of moderate-intensity exercise, such as walking, biking, or yoga, or 75 minutes of vigorous exercise each week. Vigorous exercise includes running, circuit training, and swimming.   General instructions  Keep a journal to find out what triggers your migraines so you can avoid these triggers. For example, write down: ? What you eat and drink. ? How much sleep you get. ? Any change to your diet or medicines.  Lie down in a dark, quiet room when you have a migraine.  Try placing a cool towel over your head when you have a migraine.  Keep lights dim, if bright lights bother you or make your migraines worse.  Keep all follow-up visits as told by your health care provider. This is important.   Where to find more information  Coalition for Headache and Migraine Patients (CHAMP): headachemigraine.org  American Migraine Foundation: americanmigrainefoundation.org  National Headache Foundation: headaches.org Contact a health care provider if:  Your pain does not improve, even with medicine.  Your migraines continue to return, even with medicine. Get help right away if:  Your migraine becomes severe and medicine does not help.  You have a stiff neck and fever.  You have a loss of vision.  You have muscle weakness or loss of muscle control.  You start losing your balance, or you have trouble  walking.  You feel like you may faint, or you faint.  You start having sudden and unexpected, severe headaches.  You have a seizure. Summary  Migraine headaches are usually stronger and more sudden than other headaches. Migraines are characterized by an intense pulsing, throbbing pain that is usually only present on one side of the head.  Migraines that keep coming back are called recurrent migraines. A migraine is called a chronic migraine if it happens 15 days in a month for more than 3 months.  Certain things may trigger migraines, such as lack of sleep or too much sleep, smoking, certain foods, alcohol, stress, and certain medicines.  Your treatment plan may include medicines, lifestyle changes, and behavior therapy. This information is not intended to replace advice given to you by your health care provider. Make sure you discuss any questions you have with your health care provider. Document Revised: 09/07/2019 Document Reviewed: 09/07/2019 Elsevier Patient Education  2021 Elsevier Inc.  

## 2020-12-27 NOTE — Progress Notes (Addendum)
Subjective:    Patient ID: Kristina Mclaughlin, female    DOB: 09-Aug-1950, 70 y.o.   MRN: [id number]  Chief Complaint  Patient presents with  . Migraine    Has hx when she was in her 30-40,. This one started a week ago. Sensitivity to light    Pt presents to the office today with a headache that started a week ago. She reports she has had migraines in the past when she was in her 30's-40's.  She states she was having sex with her husband and started having left sided pain from her eye back. She states since then it will come and go all through the day. States reading makes it worse. She reports she had her eyes examined 03/2020 and were stable. Migraine  This is a new problem. The current episode started in the past 7 days. The pain is located in the left unilateral and occipital region. The pain quality is not similar to prior headaches. The quality of the pain is described as throbbing and dull. The pain is at a severity of 8/10. The pain is moderate. Associated symptoms include dizziness, nausea and photophobia. Pertinent negatives include no drainage, ear pain, eye pain, phonophobia or vomiting. Nothing aggravates the symptoms. She has tried darkened room for the symptoms. The treatment provided no relief.      Review of Systems  HENT: Negative for ear pain.   Eyes: Positive for photophobia. Negative for pain.  Gastrointestinal: Positive for nausea. Negative for vomiting.  Neurological: Positive for dizziness.  All other systems reviewed and are negative.      Objective:   Physical Exam Vitals reviewed.  Constitutional:      General: She is not in acute distress.    Appearance: She is well-developed.  HENT:     Head: Normocephalic and atraumatic.     Right Ear: Tympanic membrane normal.     Left Ear: Tympanic membrane normal.  Eyes:     Pupils: Pupils are equal, round, and reactive to light.  Neck:     Thyroid: No thyromegaly.  Cardiovascular:     Rate and Rhythm:  Normal rate and regular rhythm.     Heart sounds: Murmur heard.    Pulmonary:     Effort: Pulmonary effort is normal. No respiratory distress.     Breath sounds: Normal breath sounds. No wheezing.  Abdominal:     General: Bowel sounds are normal. There is no distension.     Palpations: Abdomen is soft.     Tenderness: There is no abdominal tenderness.  Musculoskeletal:        General: No tenderness. Normal range of motion.     Cervical back: Normal range of motion and neck supple.  Skin:    General: Skin is warm and dry.  Neurological:     Mental Status: She is alert and oriented to person, place, and time.     Cranial Nerves: No cranial nerve deficit.     Deep Tendon Reflexes: Reflexes are normal and symmetric.  Psychiatric:        Behavior: Behavior normal.        Thought Content: Thought content normal.        Judgment: Judgment normal.       BP 139/74   Pulse 89   Temp (!) 97.3 F (36.3 C) (Temporal)   Wt 247 lb (112 kg)   SpO2 93%   BMI 36.48 kg/m      Assessment & Plan:  Kristina Mclaughlin comes in today with chief complaint of Migraine (Has hx when she was in her 30-40,. This one started a week ago. Sensitivity to light )   Diagnosis and orders addressed:  1. Migraine with aura and without status migrainosus, not intractable - CT Head Wo Contrast; Future  2. New onset headache - CT Head Wo Contrast; Future  3. Dizzy - CT Head Wo Contrast; Future   Given new onset, age,  and suddenly will do CT scan Continue tylenol  Red flags discussed to go to ED- vision changes, changes in speech, gait, or memory   Evelina Dun, FNP

## 2020-12-28 ENCOUNTER — Ambulatory Visit (HOSPITAL_COMMUNITY)
Admission: RE | Admit: 2020-12-28 | Discharge: 2020-12-28 | Disposition: A | Discharge status: Home / Self Care | Payer: Medicare HMO | Source: Ambulatory Visit | Attending: Family | Admitting: Family

## 2020-12-28 ENCOUNTER — Telehealth: Payer: Self-pay | Admitting: Family Medicine

## 2020-12-28 DIAGNOSIS — R42 Dizziness and giddiness: Secondary | ICD-10-CM | POA: Insufficient documentation

## 2020-12-28 DIAGNOSIS — R519 Headache, unspecified: Secondary | ICD-10-CM

## 2020-12-28 DIAGNOSIS — G43109 Migraine with aura, not intractable, without status migrainosus: Secondary | ICD-10-CM

## 2021-01-02 MED ORDER — SUMATRIPTAN SUCCINATE 100 MG PO TABS: 100.0000 mg | ORAL_TABLET | ORAL | 0 refills | Status: DC | PRN

## 2021-01-02 NOTE — Telephone Encounter (Signed)
Lmtcb.

## 2021-01-02 NOTE — Telephone Encounter (Signed)
Imitrex Prescription sent to pharmacy   

## 2021-01-03 DIAGNOSIS — J3089 Other allergic rhinitis: Secondary | ICD-10-CM | POA: Diagnosis not present

## 2021-01-03 DIAGNOSIS — J301 Allergic rhinitis due to pollen: Secondary | ICD-10-CM | POA: Diagnosis not present

## 2021-01-03 DIAGNOSIS — J3081 Allergic rhinitis due to animal (cat) (dog) hair and dander: Secondary | ICD-10-CM | POA: Diagnosis not present

## 2021-01-04 NOTE — Telephone Encounter (Signed)
Pt is returning nurse call.

## 2021-01-04 NOTE — Telephone Encounter (Signed)
Lmtcb.

## 2021-01-04 NOTE — Telephone Encounter (Signed)
Called and spoke with patient she was returning a call to confirm appt

## 2021-01-08 ENCOUNTER — Ambulatory Visit (INDEPENDENT_AMBULATORY_CARE_PROVIDER_SITE_OTHER): Payer: Medicare HMO

## 2021-01-08 DIAGNOSIS — Z0000 Encounter for general adult medical examination without abnormal findings: Secondary | ICD-10-CM | POA: Diagnosis not present

## 2021-01-08 NOTE — Patient Instructions (Signed)
  [place] Maintenance Summary and Written Plan of Care  Ms. Huskins ,  Thank you for allowing me to perform your Medicare Annual Wellness Visit and for your ongoing commitment to your health.   Health Maintenance & Immunization History Health Maintenance  Topic Date Due  . Pneumococcal Vaccine 28-68 Years old (1 of 4 - PCV13) Never done  . COVID-19 Vaccine (3 - Pfizer risk 4-dose series) 12/12/2019  . TETANUS/TDAP  10/30/2021 (Originally 05/22/2020)  . INFLUENZA VACCINE  03/04/2021  . MAMMOGRAM  06/07/2022  . COLONOSCOPY (Pts 45-50yrs Insurance coverage will need to be confirmed)  06/26/2024  . DEXA SCAN  Completed  . Hepatitis C Screening  Completed  . PNA vac Low Risk Adult  Completed  . Zoster Vaccines- Shingrix  Completed  . HPV VACCINES  Aged Out   Immunization History  Administered Date(s) Administered  . Fluad Quad(high Dose 65+) 05/23/2019, 05/30/2020  . Influenza, High Dose Seasonal PF 08/07/2016, 08/06/2017, 08/12/2018, 10/20/2019  . Influenza,inj,quad, With Preservative 05/06/2017, 05/08/2018  . Influenza-Unspecified 05/23/2015, 05/08/2018  . PFIZER(Purple Top)SARS-COV-2 Vaccination 10/24/2019, 11/14/2019  . Pneumococcal Conjugate-13 05/23/2019  . Pneumococcal Polysaccharide-23 08/07/2016, 08/06/2017, 06/09/2018, 08/12/2018, 10/20/2019  . Pneumococcal-Unspecified 05/23/2015  . Zoster Recombinat (Shingrix) 09/05/2019, 11/28/2019  . Zoster, Live 06/20/2015    These are the patient goals that we discussed: Goals Addressed   None      This is a list of Health Maintenance Items that are overdue or due now: Health Maintenance Due  Topic Date Due  . Pneumococcal Vaccine 64-38 Years old (1 of 4 - PCV13) Never done  . COVID-19 Vaccine (3 - Pfizer risk 4-dose series) 12/12/2019     Orders/Referrals Placed Today: No orders of the defined types were placed in this encounter.  (Contact our referral department at [phone] if you have not  spoken with someone about your referral appointment within the next 5 days)    Follow-up Plan  Scheduled with Dr. Lajuana Ripple on 03/08/21 at 1:15pm.

## 2021-01-08 NOTE — Progress Notes (Signed)
MEDICARE ANNUAL WELLNESS VISIT  01/08/2021  Telephone Visit Disclaimer This Medicare AWV was conducted by telephone due to national recommendations for restrictions regarding the COVID-19 Pandemic (e.g. social distancing).  I verified, using two identifiers, that I am speaking with Kristina Mclaughlin or their authorized healthcare agent. I discussed the limitations, risks, security, and privacy concerns of performing an evaluation and management service by telephone and the potential availability of an in-person appointment in the future. The patient expressed understanding and agreed to proceed.  Location of Patient: Home Location of Provider (nurse):  [facility]  Subjective:    Kristina Mclaughlin is a 70 y.o. female patient of Janora Norlander, DO who had a Medicare Annual Wellness Visit today via telephone. Kristina Mclaughlin is Retired and lives with their spouse. she has two children. she reports that she is socially active and does interact with friends/family regularly. she is minimally physically active and enjoys church activities with friends.  Patient Care Team: Janora Norlander, DO as PCP - General (Family Medicine) Lavonna Monarch, MD as Consulting Physician (Dermatology)  Advanced Directives 03/22/2019  Does Patient Have a Medical Advance Directive? Yes  Type of Advance Directive Living will  Does patient want to make changes to medical advance directive? No - Patient declined    Hospital Utilization Over the Past 12 Months: # of hospitalizations or ER visits: 1 # of surgeries: 0  Review of Systems    Patient reports that her overall health is unchanged compared to last year.    Patient Reported Readings (BP, Pulse, CBG, Weight, etc) none  Pain Assessment  Pain in left knee. Pain level is 2. It does not radiate.    Current Medications & Allergies (verified) Allergies as of 01/08/2021      Reactions   Avelox Abc [moxifloxacin]  Anaphylaxis   Lovenox [enoxaparin Sodium] Anaphylaxis   Quinolones Hives, Shortness Of Breath   Mucinex Sinus-max [phenylephrine-apap-guaifenesin] Swelling      Medication List       Accurate as of January 08, 2021 11:01 AM. If you have any questions, ask your nurse or doctor.        albuterol 108 (90 Base) MCG/ACT inhaler Commonly known as: ProAir HFA INHALE 2 PUFFS INTO THE LUNGS EVERY 6 (SIX) HOURS AS NEEDED FOR WHEEZING OR SHORTNESS OF BREATH.   amLODipine 5 MG tablet Commonly known as: NORVASC Take 1 tablet (5 mg total) by mouth daily. TO REPLACE METOPROLOL   atorvastatin 40 MG tablet Commonly known as: LIPITOR TAKE 1 TABLET BY MOUTH EVERY DAY   BIOTIN PO Take 1 capsule by mouth daily.   calcium carbonate 1500 (600 Ca) MG Tabs tablet Commonly known as: OSCAL Take by mouth.   diclofenac 75 MG EC tablet Commonly known as: VOLTAREN TAKE 1 TABLET BY MOUTH TWICE A DAY   diclofenac Sodium 1 % Gel Commonly known as: Voltaren Apply 4 g topically 4 (four) times daily.   EPINEPHrine 0.3 mg/0.3 mL Soaj injection Commonly known as: EPI-PEN See admin instructions.   fluticasone 50 MCG/ACT nasal spray Commonly known as: FLONASE PLACE 2 SPRAYS INTO BOTH NOSTRILS DAILY.   levocetirizine 5 MG tablet Commonly known as: XYZAL   MULTI-VITAMIN PO Take 1 tablet by mouth daily.   pantoprazole 40 MG tablet Commonly known as: PROTONIX Take 1 tablet (40 mg total) by mouth 2 (two) times daily before a meal.   PROBIOTIC DAILY PO Take 1 tablet by mouth daily.   psyllium 58.6 %  packet Commonly known as: METAMUCIL Take by mouth.   SUMAtriptan 100 MG tablet Commonly known as: Imitrex Take 1 tablet (100 mg total) by mouth every 2 (two) hours as needed for migraine. May repeat in 2 hours if headache persists or recurs.   Symbicort 80-4.5 MCG/ACT inhaler Generic drug: budesonide-formoterol SMARTSIG:2 Puff(s) By Mouth Twice Daily   traZODone 150 MG tablet Commonly known as:  DESYREL TAKE 1 TABLET (150 MG TOTAL) BY MOUTH AT BEDTIME AS NEEDED FOR SLEEP.   trospium 20 MG tablet Commonly known as: SANCTURA Take 1 tablet (20 mg total) by mouth 2 (two) times daily.   valsartan-hydrochlorothiazide 320-25 MG tablet Commonly known as: DIOVAN-HCT TAKE 1 TABLET BY MOUTH EVERY DAY       History (reviewed): Past Medical History:  Diagnosis Date  . 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 Essential hypertension 10/29/2015  . Hypertension   . Insomnia    Past Surgical History:  Procedure Laterality Date  . ABDOMINAL HYSTERECTOMY    . BACK SURGERY    . BREAST SURGERY     biopsy x2; reduction  . EXAMINATION UNDER ANESTHESIA  08/27/2012  . REDUCTION MAMMAPLASTY Bilateral 2002   Family History  Problem Relation Age of Onset  . Bladder Cancer Mother   . Allergies Mother   . Heart disease Mother   . Allergies Sister   . Alzheimer's disease Father   . Breast cancer Neg Hx    Social Determinants of Health with Concerns   Financial Resource Strain: Not on file  Food Insecurity: Not on file  Transportation Needs: Not on file  Physical Activity: Not on file  Stress: Not on file  Social Connections: Not on file  Intimate Partner Violence: Not on file  Alcohol Screen: Not on file  Housing: Not on file   Patient does interact with family and friends. She feels safe in her home. Patient lives with her spouse.   Activities of Daily Living  Patient is able to dress,take a bath, cook, eat, clean the home, manage medications on her own.  Patient Education/ [employer]  Exercise  Walking three times weekly for one hour.  Diet Patient reports consuming 3 meals a day and 2 snack(s) a day Patient reports that her primary diet is: Regular Patient reports that she does have regular access to food.   Depression Screen PHQ 2/9 Scores 12/27/2020 10/30/2020 10/01/2020 06/01/2020 03/22/2020 01/24/2020 11/29/2019  PHQ - 2 Score  0 0 0 0 0 0 0  PHQ- 9 Score - - - - - - -     Fall Risk Fall Risk  12/27/2020 10/30/2020 10/01/2020 06/01/2020 04/03/2020  Falls in the past year? 1 1 0 0 0  Number falls in past yr: 0 0 - - -  Injury with Fall? 1 1 - - -  Risk for fall due to : - History of fall(s) - - -  Follow up - Falls prevention discussed - - Falls evaluation completed     Objective:  Kristina Mclaughlin seemed alert and oriented and she participated appropriately during our telephone visit.  Blood Pressure Weight BMI  BP Readings from Last 3 Encounters:  12/27/20 139/74  10/30/20 (!) 192/88  10/01/20 (!) 155/76   Wt Readings from Last 3 Encounters:  12/27/20 247 lb (112 kg)  10/30/20 247 lb 3.2 oz (112.1 kg)  10/01/20 240 lb (108.9 kg)   BMI Readings from Last 1 Encounters:  12/27/20  36.48 kg/m    *Unable to obtain current vital signs, weight, and BMI due to telephone visit type  Hearing/Vision  . Kristina Mclaughlin did not seem to have difficulty with hearing/understanding during the telephone conversation . Reports that she has had a formal eye exam by an eye care professional within the past year . Reports that she has not had a formal hearing evaluation within the past year *Unable to fully assess hearing and vision during telephone visit type  Cognitive Function: 6CIT Screen 03/22/2019  What Year? 0 points  What time? 0 points  Count back from 20 0 points  Months in reverse 0 points  Repeat phrase 0 points   (Normal:0-7, Significant for Dysfunction: >8)  Pt was able to address the cognitive function screening. No errors.  Normal Cognitive Function Screening: Yes   Immunization & Health Maintenance Record Immunization History  Administered Date(s) Administered  . Fluad Quad(high Dose 65+) 05/23/2019, 05/30/2020  . Influenza, High Dose Seasonal PF 08/07/2016, 08/06/2017, 08/12/2018, 10/20/2019  . Influenza,inj,quad, With Preservative 05/06/2017, 05/08/2018  . Influenza-Unspecified 05/23/2015,  05/08/2018  . PFIZER(Purple Top)SARS-COV-2 Vaccination 10/24/2019, 11/14/2019  . Pneumococcal Conjugate-13 05/23/2019  . Pneumococcal Polysaccharide-23 08/07/2016, 08/06/2017, 06/09/2018, 08/12/2018, 10/20/2019  . Pneumococcal-Unspecified 05/23/2015  . Zoster Recombinat (Shingrix) 09/05/2019, 11/28/2019  . Zoster, Live 06/20/2015    Health Maintenance  Topic Date Due  . Pneumococcal Vaccine 65-42 Years old (1 of 4 - PCV13) Never done  . COVID-19 Vaccine (3 - Pfizer risk 4-dose series) 12/12/2019  . TETANUS/TDAP  10/30/2021 (Originally 05/22/2020)  . INFLUENZA VACCINE  03/04/2021  . MAMMOGRAM  06/07/2022  . COLONOSCOPY (Pts 45-75yrs Insurance coverage will need to be confirmed)  06/26/2024  . DEXA SCAN  Completed  . Hepatitis C Screening  Completed  . PNA vac Low Risk Adult  Completed  . Zoster Vaccines- Shingrix  Completed  . HPV VACCINES  Aged Out       Assessment  This is a routine wellness examination for Kristina Mclaughlin.  Health Maintenance: Due or Overdue Health Maintenance Due  Topic Date Due  . Pneumococcal Vaccine 91-38 Years old (1 of 4 - PCV13) Never done  . COVID-19 Vaccine (3 - Pfizer risk 4-dose series) 12/12/2019    Kristina Mclaughlin does not need a referral for Community Assistance: Care Management:   no Social Work:    no Prescription Assistance:  no Nutrition/Diabetes Education:  no   Plan:  Personalized Goals Goals Addressed   None    Personalized Health Maintenance & Screening Recommendations  Pneumococcal vaccine   Lung Cancer Screening Recommended: no (Low Dose CT Chest recommended if Age 12-80 years, 30 pack-year currently smoking OR have quit w/in past 15 years) Hepatitis C Screening recommended: no HIV Screening recommended: no  Advanced Directives: Written information was not prepared per patient's request.  Referrals & Orders No orders of the defined types were placed in this encounter.   Follow-up Plan . Follow-up  with Janora Norlander, DO as planned . Schedule 03/08/21    I have personally reviewed and noted the following in the patient's chart:   . Medical and social history . Use of alcohol, tobacco or illicit drugs  . Current medications and supplements . Functional ability and status . Nutritional status . Physical activity . Advanced directives . List of other physicians . Hospitalizations, surgeries, and ER visits in previous 12 months . Vitals . Screenings to include cognitive, depression, and falls . Referrals and appointments  In addition, I have reviewed  and discussed with Kristina Mclaughlin certain preventive protocols, quality metrics, and best practice recommendations. A written personalized care plan for preventive services as well as general preventive health recommendations is available and can be mailed to the patient at her request.      Maud Deed [facility]  11/05/345

## 2021-01-11 DIAGNOSIS — S83412A Sprain of medial collateral ligament of left knee, initial encounter: Secondary | ICD-10-CM | POA: Diagnosis not present

## 2021-01-18 DIAGNOSIS — J3089 Other allergic rhinitis: Secondary | ICD-10-CM | POA: Diagnosis not present

## 2021-01-18 DIAGNOSIS — J3081 Allergic rhinitis due to animal (cat) (dog) hair and dander: Secondary | ICD-10-CM | POA: Diagnosis not present

## 2021-01-18 DIAGNOSIS — J301 Allergic rhinitis due to pollen: Secondary | ICD-10-CM | POA: Diagnosis not present

## 2021-01-21 ENCOUNTER — Other Ambulatory Visit: Payer: Self-pay | Admitting: Family

## 2021-02-08 DIAGNOSIS — J3089 Other allergic rhinitis: Secondary | ICD-10-CM | POA: Diagnosis not present

## 2021-02-08 DIAGNOSIS — M25562 Pain in left knee: Secondary | ICD-10-CM | POA: Diagnosis not present

## 2021-02-08 DIAGNOSIS — J3081 Allergic rhinitis due to animal (cat) (dog) hair and dander: Secondary | ICD-10-CM | POA: Diagnosis not present

## 2021-02-08 DIAGNOSIS — J301 Allergic rhinitis due to pollen: Secondary | ICD-10-CM | POA: Diagnosis not present

## 2021-02-14 DIAGNOSIS — J3081 Allergic rhinitis due to animal (cat) (dog) hair and dander: Secondary | ICD-10-CM | POA: Diagnosis not present

## 2021-02-14 DIAGNOSIS — J3089 Other allergic rhinitis: Secondary | ICD-10-CM | POA: Diagnosis not present

## 2021-02-14 DIAGNOSIS — J301 Allergic rhinitis due to pollen: Secondary | ICD-10-CM | POA: Diagnosis not present

## 2021-02-21 DIAGNOSIS — J301 Allergic rhinitis due to pollen: Secondary | ICD-10-CM | POA: Diagnosis not present

## 2021-02-21 DIAGNOSIS — J3089 Other allergic rhinitis: Secondary | ICD-10-CM | POA: Diagnosis not present

## 2021-02-21 DIAGNOSIS — J3081 Allergic rhinitis due to animal (cat) (dog) hair and dander: Secondary | ICD-10-CM | POA: Diagnosis not present

## 2021-02-28 DIAGNOSIS — J301 Allergic rhinitis due to pollen: Secondary | ICD-10-CM | POA: Diagnosis not present

## 2021-02-28 DIAGNOSIS — J3081 Allergic rhinitis due to animal (cat) (dog) hair and dander: Secondary | ICD-10-CM | POA: Diagnosis not present

## 2021-02-28 DIAGNOSIS — J3089 Other allergic rhinitis: Secondary | ICD-10-CM | POA: Diagnosis not present

## 2021-03-07 DIAGNOSIS — J301 Allergic rhinitis due to pollen: Secondary | ICD-10-CM | POA: Diagnosis not present

## 2021-03-07 DIAGNOSIS — J3081 Allergic rhinitis due to animal (cat) (dog) hair and dander: Secondary | ICD-10-CM | POA: Diagnosis not present

## 2021-03-07 DIAGNOSIS — J3089 Other allergic rhinitis: Secondary | ICD-10-CM | POA: Diagnosis not present

## 2021-03-08 ENCOUNTER — Other Ambulatory Visit: Payer: Self-pay

## 2021-03-08 ENCOUNTER — Encounter: Payer: Self-pay | Admitting: Family Medicine

## 2021-03-08 ENCOUNTER — Ambulatory Visit (INDEPENDENT_AMBULATORY_CARE_PROVIDER_SITE_OTHER): Payer: Medicare HMO | Admitting: Family Medicine

## 2021-03-08 VITALS — BP 137/77 | HR 77 | Temp 97.9°F | Ht 69.0 in | Wt 244.6 lb

## 2021-03-08 DIAGNOSIS — I10 Essential (primary) hypertension: Secondary | ICD-10-CM | POA: Diagnosis not present

## 2021-03-08 DIAGNOSIS — D179 Benign lipomatous neoplasm, unspecified: Secondary | ICD-10-CM | POA: Diagnosis not present

## 2021-03-08 DIAGNOSIS — J309 Allergic rhinitis, unspecified: Secondary | ICD-10-CM

## 2021-03-08 DIAGNOSIS — E781 Pure hyperglyceridemia: Secondary | ICD-10-CM

## 2021-03-08 DIAGNOSIS — R7303 Prediabetes: Secondary | ICD-10-CM | POA: Diagnosis not present

## 2021-03-08 LAB — BAYER DCA HB A1C WAIVED: HB A1C (BAYER DCA - WAIVED): 6.2 % (ref <7.0)

## 2021-03-08 MED ORDER — SYMBICORT 80-4.5 MCG/ACT IN AERO: INHALATION_SPRAY | RESPIRATORY_TRACT | 3 refills | Status: DC

## 2021-03-08 MED ORDER — FLUTICASONE PROPIONATE 50 MCG/ACT NA SUSP: 2.0000 | Freq: Every day | NASAL | 3 refills | Status: DC

## 2021-03-08 NOTE — Progress Notes (Signed)
Subjective: CC: Fall hyperlipidemia, hypertension PCP: Janora Norlander, DO ZOX:[id number] [name] is a 70 y.o. female presenting to clinic today for:  1.  Hypertension with hyperlipidemia Patient is compliant with all of her medications.  She came in for fasting labs this morning.  She continues to struggle with weight loss, particularly given her limited mobility in the setting of left knee injury.  She continues to have some soft tissue swelling at that left knee as well and has been under the care of orthopedics for this.     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 TO REPLACE METOPROLOL, Disp: 90 tablet, Rfl: 3   atorvastatin (LIPITOR) 40 MG tablet, TAKE 1 TABLET BY MOUTH EVERY DAY, Disp: 90 tablet, Rfl: 3   BIOTIN PO, Take 1 capsule by mouth daily., Disp: , Rfl:    calcium carbonate (OSCAL) 1500 (600 Ca) MG TABS tablet, Take by mouth., Disp: , Rfl:    diclofenac (VOLTAREN) 75 MG EC tablet, TAKE 1 TABLET BY MOUTH TWICE A DAY, Disp: 40 tablet, Rfl: 0   diclofenac Sodium (VOLTAREN) 1 % GEL, Apply 4 g topically 4 (four) times daily., Disp: 200 g, Rfl: prn   EPINEPHrine 0.3 mg/0.3 mL IJ SOAJ injection, See admin instructions., Disp: , Rfl:    fluticasone (FLONASE) 50 MCG/ACT nasal spray, PLACE 2 SPRAYS INTO BOTH NOSTRILS  DAILY., Disp: 16 g, Rfl: 0   levocetirizine (XYZAL) 5 MG tablet, , Disp: , Rfl: 3   Multiple Vitamin (MULTI-VITAMIN PO), Take 1 tablet by mouth daily. , Disp: , Rfl:    pantoprazole (PROTONIX) 40 MG tablet, Take 1 tablet (40 mg total) by mouth 2 (two) times daily before a meal., Disp: 180 tablet, Rfl: 1   Probiotic Product (PROBIOTIC DAILY PO), Take 1 tablet by mouth daily. , Disp: , Rfl:    psyllium (METAMUCIL) 58.6 % packet, Take by mouth., Disp: , Rfl:    SUMAtriptan (IMITREX) 100 MG tablet, TAKE 1 TABLET AS NEED FOR MIGRAINE. MAY REPEAT IN 2 HOURS IF HEADACHE PERSISTS OR RECURS., Disp: 8 tablet, Rfl: 1   SYMBICORT 80-4.5 MCG/ACT inhaler, SMARTSIG:2 Puff(s) By Mouth Twice Daily, Disp: , Rfl:    traZODone (DESYREL) 150 MG tablet, TAKE 1 TABLET (150 MG TOTAL) BY MOUTH AT BEDTIME AS NEEDED FOR SLEEP., Disp: 90 tablet, Rfl: 3   trospium (SANCTURA) 20 MG tablet, Take 1 tablet (20 mg total) by mouth 2 (two) times daily., Disp: 180 tablet, Rfl: 3   valsartan-hydrochlorothiazide (DIOVAN-HCT) 320-25 MG tablet, TAKE 1 TABLET BY MOUTH EVERY DAY, Disp: 90 tablet, Rfl: 1 Social History   Socioeconomic History   Marital status: Married    Spouse name: Not on file   Number of children: 2   Years of education: Not on file   Highest education level: Not  on file  Occupational History   Occupation: Purchasing   Tobacco Use   Smoking status: Never   Smokeless tobacco: Never  Vaping Use   Vaping Use: Never used  Substance and Sexual Activity   Alcohol use: No    Alcohol/week: 0.0 standard drinks   Drug use: No   Sexual activity: Not on file  Other Topics Concern   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  She tries to stay active.   Social Determinants of Health   Financial Resource Strain: Not on file  Food Insecurity: Not on file  Transportation Needs: Not on file   Physical Activity: Not on file  Stress: Not on file  Social Connections: Not on file  Intimate Partner Violence: Not on file   Family History  Problem Relation Age of Onset   Bladder Cancer Mother    Allergies Mother    Heart disease Mother    Allergies Sister    Alzheimer's disease Father    Breast cancer Neg Hx     Objective: Office vital signs reviewed. BP 137/77   Pulse 77   Temp 97.9 F (36.6 C)   Ht '5\' 9"'  (1.753 m)   Wt 244 lb 9.6 oz (110.9 kg)   SpO2 95%   BMI 36.12 kg/m   Physical Examination:  General: Awake, alert, obese, No acute distress HEENT: Normal; sclera white Cardio: regular rate and rhythm, S1S2 heard, no murmurs appreciated Pulm: clear to auscultation bilaterally, no wheezes, rhonchi or rales; normal work of breathing on room air MSK: Ambulating independently.  She does seem to have soft tissue swelling around the knee but no actual joint effusion  Assessment/ Plan: 70 y.o. 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  Continue current regimen  Continue statin for hypertriglyceridemia.  Fasting lipid panel was obtained this morning.  Continues to struggle with weight loss.  We discussed keto diet, Plenity and possibly Mounjaro coming to market in the fall.  Allergic rhinitis is stable.  Flonase sent  No orders of the defined types were placed in this encounter.  No orders of the defined types were placed in this encounter.    Janora Norlander, DO [place] [phone]

## 2021-03-08 NOTE — Patient Instructions (Signed)
Plenity: capsule that takes up space in your stomach so you eat less Mounjaro: injectable that is currently out for diabetes only but may be coming to market for weight loss

## 2021-03-08 NOTE — Addendum Note (Signed)
Addended by: Reather Converse on: 03/08/2021 02:12 PM   Modules accepted: Orders

## 2021-03-09 LAB — CMP14+EGFR
ALT: 22 IU/L (ref 0–32)
AST: 16 IU/L (ref 0–40)
Albumin/Globulin Ratio: 1.8 (ref 1.2–2.2)
Albumin: 4.2 g/dL (ref 3.8–4.8)
Alkaline Phosphatase: 99 IU/L (ref 44–121)
BUN/Creatinine Ratio: 13 (ref 12–28)
BUN: 10 mg/dL (ref 8–27)
Bilirubin Total: 0.4 mg/dL (ref 0.0–1.2)
CO2: 22 mmol/L (ref 20–29)
Calcium: 9.6 mg/dL (ref 8.7–10.3)
Chloride: 100 mmol/L (ref 96–106)
Creatinine, Ser: 0.78 mg/dL (ref 0.57–1.00)
Globulin, Total: 2.3 g/dL (ref 1.5–4.5)
Glucose: 138 mg/dL — ABNORMAL HIGH (ref 65–99)
Potassium: 4 mmol/L (ref 3.5–5.2)
Sodium: 138 mmol/L (ref 134–144)
Total Protein: 6.5 g/dL (ref 6.0–8.5)
eGFR: 83 mL/min/{1.73_m2} (ref >59)

## 2021-03-09 LAB — LIPID PANEL
Chol/HDL Ratio: 3.1 ratio (ref 0.0–4.4)
Cholesterol, Total: 135 mg/dL (ref 100–199)
HDL: 44 mg/dL (ref >39)
LDL Chol Calc (NIH): 72 mg/dL (ref 0–99)
Triglycerides: 100 mg/dL (ref 0–149)
VLDL Cholesterol Cal: 19 mg/dL (ref 5–40)

## 2021-03-09 LAB — CBC WITH DIFFERENTIAL/PLATELET
Basophils Absolute: 0.1 10*3/uL (ref 0.0–0.2)
Basos: 1 %
EOS (ABSOLUTE): 0.2 10*3/uL (ref 0.0–0.4)
Eos: 2 %
Hematocrit: 41.5 % (ref 34.0–46.6)
Hemoglobin: 14.2 g/dL (ref 11.1–15.9)
Immature Grans (Abs): 0 10*3/uL (ref 0.0–0.1)
Immature Granulocytes: 1 %
Lymphocytes Absolute: 2.2 10*3/uL (ref 0.7–3.1)
Lymphs: 35 %
MCH: 29 pg (ref 26.6–33.0)
MCHC: 34.2 g/dL (ref 31.5–35.7)
MCV: 85 fL (ref 79–97)
Monocytes Absolute: 0.4 10*3/uL (ref 0.1–0.9)
Monocytes: 6 %
Neutrophils Absolute: 3.6 10*3/uL (ref 1.4–7.0)
Neutrophils: 55 %
Platelets: 243 10*3/uL (ref 150–450)
RBC: 4.89 x10E6/uL (ref 3.77–5.28)
RDW: 12.9 % (ref 11.7–15.4)
WBC: 6.5 10*3/uL (ref 3.4–10.8)

## 2021-03-19 ENCOUNTER — Other Ambulatory Visit: Payer: Self-pay | Admitting: Family Medicine

## 2021-03-19 DIAGNOSIS — N3281 Overactive bladder: Secondary | ICD-10-CM

## 2021-03-19 DIAGNOSIS — R350 Frequency of micturition: Secondary | ICD-10-CM

## 2021-03-21 ENCOUNTER — Ambulatory Visit (INDEPENDENT_AMBULATORY_CARE_PROVIDER_SITE_OTHER): Payer: Medicare HMO | Admitting: Family Medicine

## 2021-03-21 ENCOUNTER — Encounter: Payer: Self-pay | Admitting: Family Medicine

## 2021-03-21 ENCOUNTER — Other Ambulatory Visit: Payer: Self-pay

## 2021-03-21 VITALS — BP 137/74 | HR 75 | Temp 97.5°F | Ht 69.0 in | Wt 245.0 lb

## 2021-03-21 DIAGNOSIS — J3081 Allergic rhinitis due to animal (cat) (dog) hair and dander: Secondary | ICD-10-CM | POA: Diagnosis not present

## 2021-03-21 DIAGNOSIS — T63441A Toxic effect of venom of bees, accidental (unintentional), initial encounter: Secondary | ICD-10-CM | POA: Diagnosis not present

## 2021-03-21 DIAGNOSIS — J301 Allergic rhinitis due to pollen: Secondary | ICD-10-CM | POA: Diagnosis not present

## 2021-03-21 DIAGNOSIS — J3089 Other allergic rhinitis: Secondary | ICD-10-CM | POA: Diagnosis not present

## 2021-03-21 MED ORDER — METHYLPREDNISOLONE ACETATE 40 MG/ML IJ SUSP
40.0000 mg | Freq: Once | INTRAMUSCULAR | Status: AC
  Administered 2021-03-21: 40 mg via INTRAMUSCULAR

## 2021-03-21 MED ORDER — TRIAMCINOLONE ACETONIDE 0.1 % EX CREA: 1.0000 "application " | TOPICAL_CREAM | Freq: Two times a day (BID) | CUTANEOUS | 0 refills | Status: AC

## 2021-03-21 NOTE — Progress Notes (Signed)
Subjective:  Patient ID: Kristina Mclaughlin, female    DOB: 10-28-1950, 70 y.o.   MRN: [id number]  Patient Care Team: Janora Norlander, DO as PCP - General (Family Medicine) Lavonna Monarch, MD as Consulting Physician (Dermatology)   Chief Complaint:  Insect Bite   HPI: Kristina Mclaughlin is a 70 y.o. female presenting on 03/21/2021 for Insect Bite   Pt states he was stung 3 times by bees yesterday. One to her left elbow and two to her left thumb. She states the thumb is itching and burning, states she is unable to put her hand underwater due to the discomfort. She denies any respiratory or GI symptoms.      Relevant past medical, surgical, family, and social history reviewed and updated as indicated.  Allergies and medications reviewed and updated. Data reviewed: Chart in Epic.   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, lateral back   Essential hypertension 10/29/2015   Hypertension    Insomnia     Past Surgical History:  Procedure Laterality Date   ABDOMINAL HYSTERECTOMY     BACK SURGERY     BREAST SURGERY     biopsy x2; reduction   EXAMINATION UNDER ANESTHESIA  08/27/2012   REDUCTION MAMMAPLASTY Bilateral 2002    Social History   Socioeconomic History   Marital status: Married    Spouse name: Not on file   Number of children: 2   Years of education: Not on file   Highest education level: Not on file  Occupational History   Occupation: Purchasing   Tobacco Use   Smoking status: Never   Smokeless tobacco: Never  Vaping Use   Vaping Use: Never used  Substance and Sexual Activity   Alcohol use: No    Alcohol/week: 0.0 standard drinks   Drug use: No   Sexual activity: Not on file  Other Topics Concern   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  She tries to stay  active.   Social Determinants of Health   Financial Resource Strain: Not on file  Food Insecurity: Not on file  Transportation Needs: Not on file  Physical Activity: Not on file  Stress: Not on file  Social Connections: Not on file  Intimate Partner Violence: Not on file    Outpatient Encounter Medications as of 03/21/2021  Medication Sig   albuterol (PROAIR HFA) 108 (90 Base) MCG/ACT inhaler INHALE 2 PUFFS INTO THE LUNGS EVERY 6 (SIX) HOURS AS NEEDED FOR WHEEZING OR SHORTNESS OF BREATH.   amLODipine (NORVASC) 5 MG tablet Take 1 tablet (5 mg total) by mouth daily. TO REPLACE METOPROLOL   atorvastatin (LIPITOR) 40 MG tablet TAKE 1 TABLET BY MOUTH EVERY DAY   BIOTIN PO Take 1 capsule by mouth daily.   calcium carbonate (OSCAL) 1500 (600 Ca) MG TABS tablet Take by mouth.   diclofenac Sodium (VOLTAREN) 1 % GEL Apply 4 g topically 4 (four) times daily.   EPINEPHrine 0.3 mg/0.3 mL IJ SOAJ injection See admin instructions.   fluticasone (FLONASE) 50 MCG/ACT nasal spray Place 2 sprays into both nostrils daily.   Multiple Vitamin (MULTI-VITAMIN PO) Take 1 tablet by mouth daily.    pantoprazole (PROTONIX) 40 MG tablet Take 1 tablet (40 mg total) by mouth 2 (two) times daily before a meal.  Probiotic Product (PROBIOTIC DAILY PO) Take 1 tablet by mouth daily.    psyllium (METAMUCIL) 58.6 % packet Take by mouth.   SUMAtriptan (IMITREX) 100 MG tablet TAKE 1 TABLET AS NEED FOR MIGRAINE. MAY REPEAT IN 2 HOURS IF HEADACHE PERSISTS OR RECURS.   SYMBICORT 80-4.5 MCG/ACT inhaler SMARTSIG:2 Puff(s) By Mouth Twice Daily   traZODone (DESYREL) 150 MG tablet TAKE 1 TABLET (150 MG TOTAL) BY MOUTH AT BEDTIME AS NEEDED FOR SLEEP.   triamcinolone cream (KENALOG) 0.1 % Apply 1 application topically 2 (two) times daily for 5 days.   trospium (SANCTURA) 20 MG tablet TAKE 1 TABLET BY MOUTH TWICE A DAY   valsartan-hydrochlorothiazide (DIOVAN-HCT) 320-25 MG tablet TAKE 1 TABLET BY MOUTH EVERY DAY   No  facility-administered encounter medications on file as of 03/21/2021.    Allergies  Allergen Reactions   Avelox Abc [Moxifloxacin] Anaphylaxis   Lovenox [Enoxaparin Sodium] Anaphylaxis   Quinolones Hives and Shortness Of Breath   Mucinex Sinus-Max [Phenylephrine-Apap-Guaifenesin] Swelling    Review of Systems  Constitutional:  Negative for activity change, appetite change, chills, diaphoresis, fatigue, fever and unexpected weight change.  HENT: Negative.  Negative for trouble swallowing and voice change.   Eyes: Negative.   Respiratory:  Negative for apnea, cough, choking, chest tightness, shortness of breath, wheezing and stridor.   Cardiovascular:  Negative for chest pain, palpitations and leg swelling.  Gastrointestinal:  Negative for abdominal pain, blood in stool, constipation, diarrhea, nausea and vomiting.  Endocrine: Negative.   Genitourinary:  Negative for dysuria, frequency and urgency.  Musculoskeletal:  Negative for arthralgias and myalgias.  Skin:  Positive for color change and wound (bee sting x 3). Negative for pallor and rash.  Allergic/Immunologic: Negative.   Neurological:  Negative for dizziness, tremors, seizures, syncope, facial asymmetry, speech difficulty, weakness, light-headedness, numbness and headaches.  Hematological: Negative.   Psychiatric/Behavioral:  Negative for confusion, hallucinations, sleep disturbance and suicidal ideas.   All other systems reviewed and are negative.      Objective:  BP 137/74   Pulse 75   Temp (!) 97.5 F (36.4 C) (Temporal)   Ht '5\' 9"'  (1.753 m)   Wt 245 lb (111.1 kg)   BMI 36.18 kg/m    Wt Readings from Last 3 Encounters:  03/21/21 245 lb (111.1 kg)  03/08/21 244 lb 9.6 oz (110.9 kg)  12/27/20 247 lb (112 kg)    Physical Exam Vitals and nursing note reviewed.  Constitutional:      General: She is not in acute distress.    Appearance: Normal appearance. She is well-developed and well-groomed. She is obese. She is  not ill-appearing, toxic-appearing or diaphoretic.  HENT:     Head: Normocephalic and atraumatic.     Jaw: There is normal jaw occlusion.     Right Ear: Hearing normal.     Left Ear: Hearing normal.     Nose: Nose normal.     Mouth/Throat:     Lips: Pink.     Mouth: Mucous membranes are moist.     Pharynx: Oropharynx is clear. Uvula midline.  Eyes:     General: Lids are normal.     Extraocular Movements: Extraocular movements intact.     Conjunctiva/sclera: Conjunctivae normal.     Pupils: Pupils are equal, round, and reactive to light.  Neck:     Thyroid: No thyroid mass, thyromegaly or thyroid tenderness.     Vascular: No carotid bruit or JVD.     Trachea: Trachea and phonation normal.  Cardiovascular:     Rate and Rhythm: Normal rate and regular rhythm.     Chest Wall: PMI is not displaced.     Pulses: Normal pulses.     Heart sounds: Normal heart sounds. No murmur heard.   No friction rub. No gallop.  Pulmonary:     Effort: Pulmonary effort is normal. No respiratory distress.     Breath sounds: Normal breath sounds. No stridor. No wheezing, rhonchi or rales.  Abdominal:     General: Bowel sounds are normal. There is no distension or abdominal bruit.     Palpations: Abdomen is soft. There is no hepatomegaly or splenomegaly.     Tenderness: There is no abdominal tenderness. There is no right CVA tenderness or left CVA tenderness.     Hernia: No hernia is present.  Musculoskeletal:        General: Normal range of motion.     Cervical back: Normal range of motion and neck supple.     Right lower leg: No edema.     Left lower leg: No edema.  Lymphadenopathy:     Cervical: No cervical adenopathy.  Skin:    General: Skin is warm and dry.     Capillary Refill: Capillary refill takes less than 2 seconds.     Coloration: Skin is not cyanotic, jaundiced or pale.     Findings: Erythema and wound (bee stings) present. No abrasion, abscess, acne, bruising, burn, ecchymosis, signs  of injury, laceration, lesion, petechiae or rash.       Neurological:     General: No focal deficit present.     Mental Status: She is alert and oriented to person, place, and time.     Cranial Nerves: Cranial nerves are intact. No cranial nerve deficit.     Sensory: Sensation is intact. No sensory deficit.     Motor: Motor function is intact. No weakness.     Coordination: Coordination is intact. Coordination normal.     Gait: Gait is intact. Gait normal.     Deep Tendon Reflexes: Reflexes are normal and symmetric. Reflexes normal.  Psychiatric:        Attention and Perception: Attention and perception normal.        Mood and Affect: Mood and affect normal.        Speech: Speech normal.        Behavior: Behavior normal. Behavior is cooperative.        Thought Content: Thought content normal.        Cognition and Memory: Cognition and memory normal.        Judgment: Judgment normal.    Results for orders placed or performed in visit on 03/08/21  Bayer DCA Hb A1c Waived  Result Value Ref Range   HB A1C (BAYER DCA - WAIVED) 6.2 <7.0 %  Lipid panel  Result Value Ref Range   Cholesterol, Total 135 100 - 199 mg/dL   Triglycerides 100 0 - 149 mg/dL   HDL 44 >39 mg/dL   VLDL Cholesterol Cal 19 5 - 40 mg/dL   LDL Chol Calc (NIH) 72 0 - 99 mg/dL   Chol/HDL Ratio 3.1 0.0 - 4.4 ratio  CBC with Differential/Platelet  Result Value Ref Range   WBC 6.5 3.4 - 10.8 x10E3/uL   RBC 4.89 3.77 - 5.28 x10E6/uL   Hemoglobin 14.2 11.1 - 15.9 g/dL   Hematocrit 41.5 34.0 - 46.6 %   MCV 85 79 - 97 fL   MCH 29.0 26.6 -  33.0 pg   MCHC 34.2 31.5 - 35.7 g/dL   RDW 12.9 11.7 - 15.4 %   Platelets 243 150 - 450 x10E3/uL   Neutrophils 55 Not Estab. %   Lymphs 35 Not Estab. %   Monocytes 6 Not Estab. %   Eos 2 Not Estab. %   Basos 1 Not Estab. %   Neutrophils Absolute 3.6 1.4 - 7.0 x10E3/uL   Lymphocytes Absolute 2.2 0.7 - 3.1 x10E3/uL   Monocytes Absolute 0.4 0.1 - 0.9 x10E3/uL   EOS (ABSOLUTE) 0.2  0.0 - 0.4 x10E3/uL   Basophils Absolute 0.1 0.0 - 0.2 x10E3/uL   Immature Granulocytes 1 Not Estab. %   Immature Grans (Abs) 0.0 0.0 - 0.1 x10E3/uL  CMP14+EGFR  Result Value Ref Range   Glucose 138 (H) 65 - 99 mg/dL   BUN 10 8 - 27 mg/dL   Creatinine, Ser 0.78 0.57 - 1.00 mg/dL   eGFR 83 >59 mL/min/1.73   BUN/Creatinine Ratio 13 12 - 28   Sodium 138 134 - 144 mmol/L   Potassium 4.0 3.5 - 5.2 mmol/L   Chloride 100 96 - 106 mmol/L   CO2 22 20 - 29 mmol/L   Calcium 9.6 8.7 - 10.3 mg/dL   Total Protein 6.5 6.0 - 8.5 g/dL   Albumin 4.2 3.8 - 4.8 g/dL   Globulin, Total 2.3 1.5 - 4.5 g/dL   Albumin/Globulin Ratio 1.8 1.2 - 2.2   Bilirubin Total 0.4 0.0 - 1.2 mg/dL   Alkaline Phosphatase 99 44 - 121 IU/L   AST 16 0 - 40 IU/L   ALT 22 0 - 32 IU/L       Pertinent labs & imaging results that were available during my care of the patient were reviewed by me and considered in my medical decision making.  Assessment & Plan:  Giannah was seen today for insect bite.  Diagnoses and all orders for this visit:  Bee sting, accidental or unintentional, initial encounter Three bee stings. Pruritis and tenderness to thumb. No s/s of anaphylaxis. Will burst with steroids in office and treat with triamcinolone cream as prescribed. Pt aware to report any new, worsening, or persistent symptoms.  -     triamcinolone cream (KENALOG) 0.1 %; Apply 1 application topically 2 (two) times daily for 5 days.    Continue all other maintenance medications.  Follow up plan: Return if symptoms worsen or fail to improve.   Continue healthy lifestyle choices, including diet (rich in fruits, vegetables, and lean proteins, and low in salt and simple carbohydrates) and exercise (at least 30 minutes of moderate physical activity daily).  Educational handout given for bee sting  The above assessment and management plan was discussed with the patient. The patient verbalized understanding of and has agreed to the  management plan. Patient is aware to call the clinic if they develop any new symptoms or if symptoms persist or worsen. Patient is aware when to return to the clinic for a follow-up visit. Patient educated on when it is appropriate to go to the emergency department.   Monia Pouch, FNP-C [facility] [phone]

## 2021-03-24 ENCOUNTER — Other Ambulatory Visit: Payer: Self-pay | Admitting: Family Medicine

## 2021-04-03 DIAGNOSIS — J301 Allergic rhinitis due to pollen: Secondary | ICD-10-CM | POA: Diagnosis not present

## 2021-04-03 DIAGNOSIS — J3081 Allergic rhinitis due to animal (cat) (dog) hair and dander: Secondary | ICD-10-CM | POA: Diagnosis not present

## 2021-04-03 DIAGNOSIS — J3089 Other allergic rhinitis: Secondary | ICD-10-CM | POA: Diagnosis not present

## 2021-04-19 DIAGNOSIS — J3081 Allergic rhinitis due to animal (cat) (dog) hair and dander: Secondary | ICD-10-CM | POA: Diagnosis not present

## 2021-04-19 DIAGNOSIS — J3089 Other allergic rhinitis: Secondary | ICD-10-CM | POA: Diagnosis not present

## 2021-04-19 DIAGNOSIS — J301 Allergic rhinitis due to pollen: Secondary | ICD-10-CM | POA: Diagnosis not present

## 2021-04-25 ENCOUNTER — Other Ambulatory Visit: Payer: Self-pay | Admitting: Family Medicine

## 2021-04-25 DIAGNOSIS — I10 Essential (primary) hypertension: Secondary | ICD-10-CM

## 2021-04-26 DIAGNOSIS — J3081 Allergic rhinitis due to animal (cat) (dog) hair and dander: Secondary | ICD-10-CM | POA: Diagnosis not present

## 2021-04-26 DIAGNOSIS — J301 Allergic rhinitis due to pollen: Secondary | ICD-10-CM | POA: Diagnosis not present

## 2021-04-26 DIAGNOSIS — J3089 Other allergic rhinitis: Secondary | ICD-10-CM | POA: Diagnosis not present

## 2021-05-02 ENCOUNTER — Telehealth: Payer: Self-pay | Admitting: Family Medicine

## 2021-05-02 NOTE — Telephone Encounter (Signed)
REFERRAL REQUEST Telephone Note  Have you been seen at our office for this problem? yes (Advise that they may need an appointment with their PCP before a referral can be done)  Reason for Referral: knot on leg getting bigger Referral discussed with patient: yes Best contact number of patient for referral team: [phone]   Has patient been seen by a specialist for this issue before: yes Patient provider preference for referral: lindsay brigges Patient location preference for referral:    Patient notified that referrals can take up to a week or longer to process. If they haven't heard anything within a week they should call back and speak with the referral department.

## 2021-05-03 ENCOUNTER — Other Ambulatory Visit: Payer: Self-pay | Admitting: Family Medicine

## 2021-05-03 DIAGNOSIS — D179 Benign lipomatous neoplasm, unspecified: Secondary | ICD-10-CM

## 2021-05-03 NOTE — Telephone Encounter (Signed)
Referral placed.

## 2021-05-09 DIAGNOSIS — J3089 Other allergic rhinitis: Secondary | ICD-10-CM | POA: Diagnosis not present

## 2021-05-09 DIAGNOSIS — J301 Allergic rhinitis due to pollen: Secondary | ICD-10-CM | POA: Diagnosis not present

## 2021-05-09 DIAGNOSIS — J3081 Allergic rhinitis due to animal (cat) (dog) hair and dander: Secondary | ICD-10-CM | POA: Diagnosis not present

## 2021-05-14 ENCOUNTER — Ambulatory Visit: Payer: Medicare HMO | Admitting: General Surgery

## 2021-05-23 DIAGNOSIS — J3081 Allergic rhinitis due to animal (cat) (dog) hair and dander: Secondary | ICD-10-CM | POA: Diagnosis not present

## 2021-05-23 DIAGNOSIS — J3089 Other allergic rhinitis: Secondary | ICD-10-CM | POA: Diagnosis not present

## 2021-05-23 DIAGNOSIS — J301 Allergic rhinitis due to pollen: Secondary | ICD-10-CM | POA: Diagnosis not present

## 2021-05-27 ENCOUNTER — Other Ambulatory Visit: Payer: Self-pay

## 2021-05-27 ENCOUNTER — Encounter: Payer: Self-pay | Admitting: Nurse Practitioner

## 2021-05-27 ENCOUNTER — Ambulatory Visit (INDEPENDENT_AMBULATORY_CARE_PROVIDER_SITE_OTHER): Payer: Medicare HMO | Admitting: Nurse Practitioner

## 2021-05-27 VITALS — BP 152/92 | HR 77 | Temp 97.3°F | Ht 69.0 in | Wt 247.0 lb

## 2021-05-27 DIAGNOSIS — Z23 Encounter for immunization: Secondary | ICD-10-CM | POA: Diagnosis not present

## 2021-05-27 DIAGNOSIS — R0789 Other chest pain: Secondary | ICD-10-CM | POA: Diagnosis not present

## 2021-05-27 DIAGNOSIS — M25562 Pain in left knee: Secondary | ICD-10-CM | POA: Insufficient documentation

## 2021-05-27 MED ORDER — DICLOFENAC SODIUM 1 % EX GEL: 2.0000 g | Freq: Four times a day (QID) | CUTANEOUS | 1 refills | Status: DC

## 2021-05-27 MED ORDER — PREDNISONE 10 MG (21) PO TBPK: ORAL_TABLET | ORAL | 0 refills | Status: DC

## 2021-05-27 NOTE — Progress Notes (Signed)
Acute Office Visit  Subjective:    Patient ID: Kristina Mclaughlin, female    DOB: 1951-06-06, 70 y.o.   MRN: [id number]  Chief Complaint  Patient presents with   [place] The accident occurred 2 days ago. The fall occurred while walking. She fell from an unknown height. She landed on [place]. There was no blood loss. The point of impact was the left knee (chest). The pain is present in the left hip. The pain is moderate. The symptoms are aggravated by ambulation and movement. Pertinent negatives include no headaches, nausea, numbness or tingling.  Knee Pain  The incident occurred 2 days ago. Incident location: [employer]. The injury mechanism was a fall. The pain is present in the left knee. The pain is moderate. The pain has been Constant since onset. Pertinent negatives include no numbness or tingling. She reports no foreign bodies present. The symptoms are aggravated by movement. She has tried NSAIDs for the symptoms. The treatment provided moderate relief.  Chest Pain  This is a new problem. The onset quality is sudden. The problem occurs constantly. The problem has been unchanged. The pain is present in the lateral region. The pain is moderate. Quality: aching. Pertinent negatives include no headaches, nausea or numbness. She has tried NSAIDs for the symptoms. The treatment provided moderate relief. Risk factors: fell on chest.    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, lateral back   Essential hypertension 10/29/2015   Hypertension    Insomnia     Past Surgical History:  Procedure Laterality Date   ABDOMINAL HYSTERECTOMY     BACK SURGERY     BREAST SURGERY     biopsy x2; reduction   EXAMINATION UNDER ANESTHESIA  08/27/2012   REDUCTION MAMMAPLASTY Bilateral 2002    Family History  Problem Relation Age of Onset   Bladder Cancer Mother    Allergies Mother    Heart disease Mother     Allergies Sister    Alzheimer's disease Father    Breast cancer Neg Hx     Social History   Socioeconomic History   Marital status: Married    Spouse name: Not on file   Number of children: 2   Years of education: Not on file   Highest education level: Not on file  Occupational History   Occupation: Purchasing   Tobacco Use   Smoking status: Never   Smokeless tobacco: Never  Vaping Use   Vaping Use: Never used  Substance and Sexual Activity   Alcohol use: No    Alcohol/week: 0.0 standard drinks   Drug use: No   Sexual activity: Not on file  Other Topics Concern   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  She tries to stay active.   Social Determinants of Health   Financial Resource Strain: Not on file  Food Insecurity: Not on file  Transportation Needs: Not on file  Physical Activity: Not on file  Stress: Not on file  Social Connections: Not on file  Intimate Partner Violence: Not on file    Outpatient Medications Prior to Visit  Medication Sig Dispense Refill   albuterol (PROAIR HFA) 108 (90 Base) MCG/ACT inhaler INHALE 2 PUFFS INTO THE LUNGS EVERY 6 (SIX) HOURS AS NEEDED FOR  WHEEZING OR SHORTNESS OF BREATH. 8 g 2   amLODipine (NORVASC) 5 MG tablet Take 1 tablet (5 mg total) by mouth daily. TO REPLACE METOPROLOL 90 tablet 3   atorvastatin (LIPITOR) 40 MG tablet TAKE 1 TABLET BY MOUTH EVERY DAY 90 tablet 3   BIOTIN PO Take 1 capsule by mouth daily.     calcium carbonate (OSCAL) 1500 (600 Ca) MG TABS tablet Take by mouth.     diclofenac Sodium (VOLTAREN) 1 % GEL Apply 4 g topically 4 (four) times daily. 200 g prn   EPINEPHrine 0.3 mg/0.3 mL IJ SOAJ injection See admin instructions.     fluticasone (FLONASE) 50 MCG/ACT nasal spray Place 2 sprays into both nostrils daily. 48 g 3   Multiple Vitamin (MULTI-VITAMIN PO) Take 1 tablet by mouth daily.       pantoprazole (PROTONIX) 40 MG tablet Take 1 tablet (40 mg total) by mouth 2 (two) times daily before a meal. 180 tablet 1   Probiotic Product (PROBIOTIC DAILY PO) Take 1 tablet by mouth daily.      psyllium (METAMUCIL) 58.6 % packet Take by mouth.     SUMAtriptan (IMITREX) 100 MG tablet TAKE 1 TABLET AS NEED FOR MIGRAINE. MAY REPEAT IN 2 HOURS IF HEADACHE PERSISTS OR RECURS. 8 tablet 12   SYMBICORT 80-4.5 MCG/ACT inhaler SMARTSIG:2 Puff(s) By Mouth Twice Daily 3 each 3   traZODone (DESYREL) 150 MG tablet TAKE 1 TABLET (150 MG TOTAL) BY MOUTH AT BEDTIME AS NEEDED FOR SLEEP. 90 tablet 3   trospium (SANCTURA) 20 MG tablet TAKE 1 TABLET BY MOUTH TWICE A DAY 180 tablet 3   valsartan-hydrochlorothiazide (DIOVAN-HCT) 320-25 MG tablet TAKE 1 TABLET BY MOUTH EVERY DAY 90 tablet 1   No facility-administered medications prior to visit.    Allergies  Allergen Reactions   Avelox Abc [Moxifloxacin] Anaphylaxis   Lovenox [Enoxaparin Sodium] Anaphylaxis   Quinolones Hives and Shortness Of Breath   Mucinex Sinus-Max [Phenylephrine-Apap-Guaifenesin] Swelling    Review of Systems  Constitutional: Negative.   HENT: Negative.    Respiratory: Negative.    Cardiovascular:  Positive for chest pain.  Gastrointestinal: Negative.  Negative for nausea.  Genitourinary: Negative.   Skin:  Negative for rash.  Neurological:  Negative for tingling, numbness and headaches.  All other systems reviewed and are negative.     Objective:    Physical Exam Vitals and nursing note reviewed.  Constitutional:      Appearance: Normal appearance.  HENT:     Head: Normocephalic.     Nose: Nose normal.     Mouth/Throat:     Mouth: Mucous membranes are moist.     Pharynx: Oropharynx is clear.  Eyes:     Conjunctiva/sclera: Conjunctivae normal.  Cardiovascular:     Rate and Rhythm: Normal rate and regular rhythm.     Pulses: Normal pulses.     Heart sounds: Normal heart sounds.  Pulmonary:     Effort: Pulmonary  effort is normal.     Breath sounds: Normal breath sounds.  Abdominal:     General: Bowel sounds are normal.  Musculoskeletal:        General: Tenderness present.  Skin:    Findings: No rash.  Neurological:     Mental Status: She is alert and oriented to person, place, and time.    There were no vitals taken for this visit. Wt Readings from Last 3 Encounters:  03/21/21 245 lb (111.1 kg)  03/08/21 244 lb 9.6 oz (110.9 kg)  12/27/20 247 lb (112 kg)    Health Maintenance Due  Topic Date Due   COVID-19 Vaccine (3 - Pfizer risk series) 12/12/2019   INFLUENZA VACCINE  03/04/2021    There are no preventive care reminders to display for this patient.   Lab Results  Component Value Date   TSH 3.380 11/22/2019   Lab Results  Component Value Date   WBC 6.5 03/08/2021   HGB 14.2 03/08/2021   HCT 41.5 03/08/2021   MCV 85 03/08/2021   PLT 243 03/08/2021   Lab Results  Component Value Date   NA 138 03/08/2021   K 4.0 03/08/2021   CO2 22 03/08/2021   GLUCOSE 138 (H) 03/08/2021   BUN 10 03/08/2021   CREATININE 0.78 03/08/2021   BILITOT 0.4 03/08/2021   ALKPHOS 99 03/08/2021   AST 16 03/08/2021   ALT 22 03/08/2021   PROT 6.5 03/08/2021   ALBUMIN 4.2 03/08/2021   CALCIUM 9.6 03/08/2021   EGFR 83 03/08/2021   Lab Results  Component Value Date   CHOL 135 03/08/2021   Lab Results  Component Value Date   HDL 44 03/08/2021   Lab Results  Component Value Date   LDLCALC 72 03/08/2021   Lab Results  Component Value Date   TRIG 100 03/08/2021   Lab Results  Component Value Date   CHOLHDL 3.1 03/08/2021   Lab Results  Component Value Date   HGBA1C 6.2 03/08/2021       Assessment & Plan:   Problem List Items Addressed This Visit   None    No orders of the defined types were placed in this encounter.    Ivy Lynn, NP

## 2021-05-27 NOTE — Assessment & Plan Note (Signed)
Patient fell in [place] 2 days ago and has experienced pain ever since. Currently using 800 mg Ibuprofen with moderate relief, I added a prednisone taper, and Voltaren  Gel, ice /heating pad as tolerated, left knee x-ray  Completed.   Education provided with printed hand out given, patient knows to follow up with worsening unresolved symptoms.

## 2021-05-27 NOTE — Assessment & Plan Note (Signed)
Patient fell in [place] 2 days ago and has experienced chest wall pain ever since. Currently using 800 mg Ibuprofen with moderate relief, I added a prednisone taper, and Voltaren  Gel, ice /heating pad as tolerated, chest  x-ray  Completed.   Education provided with printed hand out given, patient knows to follow up with worsening unresolved symptoms.

## 2021-05-27 NOTE — Patient Instructions (Signed)
Acute Knee Pain, Adult Acute knee pain is sudden and may be caused by damage, swelling, or irritation of the muscles and tissues that support the knee. Pain may result from: A fall. An injury to the knee from twisting motions. A hit to the knee. Infection. Acute knee pain may go away on its own with time and rest. If it does not, your health care provider may order tests to find the cause of the pain. These may include: Imaging tests, such as an X-ray, MRI, CT scan, or ultrasound. Joint aspiration. In this test, fluid is removed from the knee and evaluated. Arthroscopy. In this test, a lighted tube is inserted into the knee and an image is projected onto a TV screen. Biopsy. In this test, a sample of tissue is removed from the body and studied under a microscope. Follow these instructions at home: If you have a knee sleeve or brace:  Wear the knee sleeve or brace as told by your health care provider. Remove it only as told by your health care provider. Loosen it if your toes tingle, become numb, or turn cold and blue. Keep it clean. If the knee sleeve or brace is not waterproof: Do not let it get wet. Cover it with a watertight covering when you take a bath or shower.  Activity Rest your knee. Do not do things that cause pain or make pain worse. Avoid high-impact activities or exercises, such as running, jumping rope, or doing jumping jacks. Work with a physical therapist to make a safe exercise program, as recommended by your health care provider. Do exercises as told by your physical therapist. Managing pain, stiffness, and swelling  If directed, put ice on the affected knee. To do this: If you have a removable knee sleeve or brace, remove it as told by your health care provider. Put ice in a plastic bag. Place a towel between your skin and the bag. Leave the ice on for 20 minutes, 2-3 times a day. Remove the ice if your skin turns bright red. This is very important. If you cannot  feel pain, heat, or cold, you have a greater risk of damage to the area. If directed, use an elastic bandage to put pressure (compression) on your injured knee. This may control swelling, give support, and help with discomfort. Raise (elevate) your knee above the level of your heart while you are sitting or lying down. Sleep with a pillow under your knee.  General instructions Take over-the-counter and prescription medicines only as told by your health care provider. Do not use any products that contain nicotine or tobacco, such as cigarettes, e-cigarettes, and chewing tobacco. If you need help quitting, ask your health care provider. If you are overweight, work with your health care provider and a dietitian to set a weight-loss goal that is healthy and reasonable for you. Extra weight can put pressure on your knee. Pay attention to any changes in your symptoms. Keep all follow-up visits. This is important. Contact a health care provider if: Your knee pain continues, changes, or gets worse. You have a fever along with knee pain. Your knee feels warm to the touch or is red. Your knee buckles or locks up. Get help right away if: Your knee swells, and the swelling becomes worse. You cannot move your knee. You have severe pain in your knee that cannot be managed with pain medicine. Summary Acute knee pain can be caused by a fall, an injury, an infection, or damage, swelling,   or irritation of the tissues that support your knee. Your health care provider may perform tests to find out the cause of the pain. Pay attention to any changes in your symptoms. Relieve your pain with rest, medicines, light activity, and the use of ice. Get help right away if your knee swells, you cannot move your knee, or you have severe pain that cannot be managed with medicine. This information is not intended to replace advice given to you by your health care provider. Make sure you discuss any questions you have with  your healthcare provider. Document Revised: 01/04/2020 Document Reviewed: 01/04/2020 Elsevier Patient Education  2022 [employer]  

## 2021-05-28 ENCOUNTER — Other Ambulatory Visit (INDEPENDENT_AMBULATORY_CARE_PROVIDER_SITE_OTHER): Payer: Medicare HMO

## 2021-05-28 DIAGNOSIS — R0789 Other chest pain: Secondary | ICD-10-CM | POA: Diagnosis not present

## 2021-05-28 DIAGNOSIS — M25562 Pain in left knee: Secondary | ICD-10-CM

## 2021-05-28 DIAGNOSIS — K449 Diaphragmatic hernia without obstruction or gangrene: Secondary | ICD-10-CM | POA: Diagnosis not present

## 2021-05-28 DIAGNOSIS — M1712 Unilateral primary osteoarthritis, left knee: Secondary | ICD-10-CM | POA: Diagnosis not present

## 2021-05-29 ENCOUNTER — Telehealth: Payer: Self-pay | Admitting: Nurse Practitioner

## 2021-05-29 NOTE — Telephone Encounter (Signed)
Patient is calling about xrays from yesterday. Patient seen JE

## 2021-05-31 ENCOUNTER — Encounter: Payer: Self-pay | Admitting: Family Medicine

## 2021-05-31 ENCOUNTER — Ambulatory Visit (INDEPENDENT_AMBULATORY_CARE_PROVIDER_SITE_OTHER): Payer: Medicare HMO | Admitting: Family Medicine

## 2021-05-31 DIAGNOSIS — R058 Other specified cough: Secondary | ICD-10-CM

## 2021-05-31 MED ORDER — AMOXICILLIN-POT CLAVULANATE 875-125 MG PO TABS: 1.0000 | ORAL_TABLET | Freq: Two times a day (BID) | ORAL | 0 refills | Status: DC

## 2021-05-31 MED ORDER — ALBUTEROL SULFATE HFA 108 (90 BASE) MCG/ACT IN AERS: INHALATION_SPRAY | RESPIRATORY_TRACT | 2 refills | Status: DC

## 2021-05-31 MED ORDER — GUAIFENESIN ER 600 MG PO TB12: 600.0000 mg | ORAL_TABLET | Freq: Two times a day (BID) | ORAL | 1 refills | Status: DC

## 2021-05-31 NOTE — Progress Notes (Signed)
Virtual Visit via telephone Note  I connected with Kristina Mclaughlin on 05/31/21 at 1733 by telephone and verified that I am speaking with the correct person using two identifiers. Kristina Mclaughlin is currently located at home and patient are currently with her during visit. The provider, Fransisca Kaufmann Kristina Goupil, MD is located in their office at time of visit.  Call ended at 1740  I discussed the limitations, risks, security and privacy concerns of performing an evaluation and management service by telephone and the availability of in person appointments. I also discussed with the patient that there may be a patient responsible charge related to this service. The patient expressed understanding and agreed to proceed.   History and Present Illness: Patient is calling in for cough and congestion and it is worsening.  It started 4 days ago.  She says it is worsening and has body and headaches. She has asthma and is using inhalers albuterol and symbicort.  She is taking ibuprofen and tylenol and they help with aches. She has an asthma doctor. She has productive cough and it is pretty rough. She denies sick contacts.   No diagnosis found.  Outpatient Encounter Medications as of 05/31/2021  Medication Sig   albuterol (PROAIR HFA) 108 (90 Base) MCG/ACT inhaler INHALE 2 PUFFS INTO THE LUNGS EVERY 6 (SIX) HOURS AS NEEDED FOR WHEEZING OR SHORTNESS OF BREATH.   amLODipine (NORVASC) 5 MG tablet Take 1 tablet (5 mg total) by mouth daily. TO REPLACE METOPROLOL   atorvastatin (LIPITOR) 40 MG tablet TAKE 1 TABLET BY MOUTH EVERY DAY   BIOTIN PO Take 1 capsule by mouth daily.   calcium carbonate (OSCAL) 1500 (600 Ca) MG TABS tablet Take by mouth.   diclofenac Sodium (VOLTAREN) 1 % GEL Apply 2 g topically 4 (four) times daily.   EPINEPHrine 0.3 mg/0.3 mL IJ SOAJ injection See admin instructions.   fluticasone (FLONASE) 50 MCG/ACT nasal spray Place 2 sprays into both nostrils daily.   Multiple  Vitamin (MULTI-VITAMIN PO) Take 1 tablet by mouth daily.    pantoprazole (PROTONIX) 40 MG tablet Take 1 tablet (40 mg total) by mouth 2 (two) times daily before a meal.   predniSONE (STERAPRED UNI-PAK 21 TAB) 10 MG (21) TBPK tablet 6 tablets day 1, 5 tablets 2, 4 tablet day 3, 3 tablets day 4, 2 tablet day 5, 1 tablet day 6   Probiotic Product (PROBIOTIC DAILY PO) Take 1 tablet by mouth daily.    psyllium (METAMUCIL) 58.6 % packet Take by mouth.   SUMAtriptan (IMITREX) 100 MG tablet TAKE 1 TABLET AS NEED FOR MIGRAINE. MAY REPEAT IN 2 HOURS IF HEADACHE PERSISTS OR RECURS.   SYMBICORT 80-4.5 MCG/ACT inhaler SMARTSIG:2 Puff(s) By Mouth Twice Daily   traZODone (DESYREL) 150 MG tablet TAKE 1 TABLET (150 MG TOTAL) BY MOUTH AT BEDTIME AS NEEDED FOR SLEEP.   trospium (SANCTURA) 20 MG tablet TAKE 1 TABLET BY MOUTH TWICE A DAY   valsartan-hydrochlorothiazide (DIOVAN-HCT) 320-25 MG tablet TAKE 1 TABLET BY MOUTH EVERY DAY   No facility-administered encounter medications on file as of 05/31/2021.    Review of Systems  Constitutional:  Positive for chills. Negative for fever.  HENT:  Positive for congestion, postnasal drip and rhinorrhea. Negative for ear discharge, ear pain, sinus pressure, sneezing and sore throat.   Eyes:  Negative for pain, redness and visual disturbance.  Respiratory:  Positive for cough and wheezing. Negative for chest tightness and shortness of breath.   Cardiovascular:  Negative for  chest pain and leg swelling.  Genitourinary:  Negative for difficulty urinating and dysuria.  Musculoskeletal:  Positive for myalgias. Negative for back pain and gait problem.  Skin:  Negative for rash.  Neurological:  Positive for headaches. Negative for light-headedness.  Psychiatric/Behavioral:  Negative for agitation and behavioral problems.   All other systems reviewed and are negative.  Observations/Objective: Patient is having significant coughing spells and difficulty finishing sentences  without coughing spells  Assessment and Plan: Problem List Items Addressed This Visit       Other   Upper airway cough syndrome - Primary   Relevant Medications   amoxicillin-clavulanate (AUGMENTIN) 875-125 MG tablet   guaiFENesin (MUCINEX) 600 MG 12 hr tablet   albuterol (PROAIR HFA) 108 (90 Base) MCG/ACT inhaler    Patient sounds like a bronchitis or upper airway cough syndrome or an asthma exacerbation, she just started prednisone for her back today so recommended to go ahead and continue with diet and sent amoxicillin albuterol and generic Mucinex or plain Mucinex which she says that she can take and is not allergic to.  Warned that if anything worsens to go to the emergency department. Follow up plan: No follow-ups on file.     I discussed the assessment and treatment plan with the patient. The patient was provided an opportunity to ask questions and all were answered. The patient agreed with the plan and demonstrated an understanding of the instructions.   The patient was advised to call back or seek an in-person evaluation if the symptoms worsen or if the condition fails to improve as anticipated.  The above assessment and management plan was discussed with the patient. The patient verbalized understanding of and has agreed to the management plan. Patient is aware to call the clinic if symptoms persist or worsen. Patient is aware when to return to the clinic for a follow-up visit. Patient educated on when it is appropriate to go to the emergency department.    I provided 7 minutes of non-face-to-face time during this encounter.    Worthy Rancher, MD

## 2021-06-01 ENCOUNTER — Other Ambulatory Visit: Payer: Self-pay | Admitting: Family Medicine

## 2021-06-01 DIAGNOSIS — I10 Essential (primary) hypertension: Secondary | ICD-10-CM

## 2021-06-06 DIAGNOSIS — J3089 Other allergic rhinitis: Secondary | ICD-10-CM | POA: Diagnosis not present

## 2021-06-06 DIAGNOSIS — J3081 Allergic rhinitis due to animal (cat) (dog) hair and dander: Secondary | ICD-10-CM | POA: Diagnosis not present

## 2021-06-06 DIAGNOSIS — J301 Allergic rhinitis due to pollen: Secondary | ICD-10-CM | POA: Diagnosis not present

## 2021-06-11 ENCOUNTER — Other Ambulatory Visit: Payer: Self-pay

## 2021-06-11 ENCOUNTER — Ambulatory Visit: Payer: Medicare HMO | Admitting: General Surgery

## 2021-06-11 ENCOUNTER — Encounter: Payer: Self-pay | Admitting: General Surgery

## 2021-06-11 VITALS — BP 133/76 | HR 66 | Temp 97.6°F | Resp 14 | Ht 69.0 in | Wt 256.0 lb

## 2021-06-11 DIAGNOSIS — D1720 Benign lipomatous neoplasm of skin and subcutaneous tissue of unspecified limb: Secondary | ICD-10-CM | POA: Diagnosis not present

## 2021-06-11 NOTE — Patient Instructions (Signed)
Lipoma Removal Lipoma removal is a surgical procedure to remove a lipoma, which is a noncancerous (benign) tumor that is made up of fat cells. Most lipomas are small and painless and do not require treatment. They can form in many areas of the body but are most common under the skin of the back, arms, shoulders, buttocks, and thighs. You may need lipoma removal if you have a lipoma that is large, growing, or causing discomfort. Lipoma removal may also be done for cosmetic reasons. Tell a health care provider about: Any allergies you have. All medicines you are taking, including vitamins, herbs, eye drops, creams, and over-the-counter medicines. Any problems you or family members have had with anesthetic medicines. Any blood disorders you have. Any surgeries you have had. Any medical conditions you have. Whether you are pregnant or may be pregnant. What are the risks? Generally, this is a safe procedure. However, problems may occur, including: Infection. Bleeding. Scarring. Allergic reactions to medicines. Damage to nearby structures or organs, such as damage to nerves or blood vessels near the lipoma. What happens before the procedure? Staying hydrated Follow instructions from your health care provider about hydration, which may include: Up to 2 hours before the procedure - you may continue to drink clear liquids, such as water, clear fruit juice, black coffee, and plain tea. Eating and drinking restrictions Follow instructions from your health care provider about eating and drinking, which may include: 8 hours before the procedure - stop eating heavy meals or foods, such as meat, fried foods, or fatty foods. 6 hours before the procedure - stop eating light meals or foods, such as toast or cereal. 6 hours before the procedure - stop drinking milk or drinks that contain milk. 2 hours before the procedure - stop drinking clear liquids. Medicines Ask your health care provider  about: Changing or stopping your regular medicines. This is especially important if you are taking diabetes medicines or blood thinners. Taking medicines such as aspirin and ibuprofen. These medicines can thin your blood. Do not take these medicines unless your health care provider tells you to take them. Taking over-the-counter medicines, vitamins, herbs, and supplements. General instructions You will have a physical exam. Your health care provider will check the size of the lipoma and whether it can be moved easily. You may have a biopsy and imaging tests, such as X-rays, a CT scan, and an MRI. Do not use any products that contain nicotine or tobacco for at least 4 weeks before the procedure. These products include cigarettes, e-cigarettes, and chewing tobacco. If you need help quitting, ask your health care provider. Ask your health care provider: How your surgery site will be marked. What steps will be taken to help prevent infection. These may include: Washing skin with a germ-killing soap. Taking antibiotic medicine. Plan to have someone take you home from the hospital or clinic. If you will be going home right after the procedure, plan to have someone with you for 24 hours. What happens during the procedure?  An IV will be inserted into one of your veins. You will be given one or more of the following: A medicine to help you relax (sedative). A medicine to numb the area (local anesthetic). A medicine to make you fall asleep (general anesthetic). A medicine that is injected into an area of your body to numb everything below the injection site (regional anesthetic). An incision will be made over the lipoma or very near the lipoma. The incision may be made  in a natural skin line or crease. Tissues, nerves, and blood vessels near the lipoma will be moved out of the way. The lipoma and the capsule that surrounds it will be separated from the surrounding tissues. The lipoma will be  removed. The incision may be closed with stitches (sutures). A bandage (dressing) will be placed over the incision. The procedure may vary among health care providers and hospitals. What happens after the procedure? Your blood pressure, heart rate, breathing rate, and blood oxygen level will be monitored until you leave the hospital or clinic. If you were prescribed an antibiotic medicine, use it as told by your health care provider. Do not stop using the antibiotic even if you start to feel better. If you were given a sedative during the procedure, it can affect you for several hours. Do not drive or operate machinery until your health care provider says that it is safe. Summary Before the procedure, follow instructions from your health care provider about eating and drinking, and changing or stopping your regular medicines. This is especially important if you are taking diabetes medicines or blood thinners. After the lipoma is removed, the incision may be closed with stitches (sutures) and covered with a bandage (dressing). If you were given a sedative during the procedure, it can affect you for several hours. Do not drive or operate machinery until your health care provider says that it is safe. This information is not intended to replace advice given to you by your health care provider. Make sure you discuss any questions you have with your health care provider. Document Revised: 03/07/2019 Document Reviewed: 03/07/2019 Elsevier Patient Education  [place].

## 2021-06-11 NOTE — Progress Notes (Signed)
[facility] History and Physical   Chief Complaint   Lipoma     Kristina Mclaughlin is a 70 y.o. female.  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 She had a deep lipoma with some associated vessels on the left ankle and all of this could not be removed previously. She says that these on her ankles /feet cause her to have issues with her shoes. She also has a new area on her left arm. She says that these areas bother her when they rub up against other things or from her shoes.   She denies any major changes in her medical history. She had the previous excision with just local but we discussed needing some sedation given the depth of the ankle lipoma on the left.   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, lateral back   Essential hypertension 10/29/2015   Hypertension    Insomnia     Past Surgical History:  Procedure Laterality Date   ABDOMINAL HYSTERECTOMY     BACK SURGERY     BREAST SURGERY     biopsy x2; reduction   EXAMINATION UNDER ANESTHESIA  08/27/2012   REDUCTION MAMMAPLASTY Bilateral 2002    Family History  Problem Relation Age of Onset   Bladder Cancer Mother    Allergies Mother    Heart disease Mother    Allergies Sister    Alzheimer's disease Father    Breast cancer Neg Hx     Social History   Tobacco Use   Smoking status: Never   Smokeless tobacco: Never  Vaping Use   Vaping Use: Never used  Substance Use Topics   Alcohol use: No    Alcohol/week: 0.0 standard drinks   Drug use: No    Medications: I have reviewed the patient's current medications. Allergies as of 06/11/2021       Reactions   Avelox Abc [moxifloxacin] Anaphylaxis   Lovenox [enoxaparin Sodium] Anaphylaxis   Quinolones Hives, Shortness Of Breath   Mucinex Sinus-max [phenylephrine-apap-guaifenesin] Swelling        Medication List         Accurate as of June 11, 2021 11:56 AM. If you have any questions, ask your nurse or doctor.          STOP taking these medications    amoxicillin-clavulanate 875-125 MG tablet Commonly known as: AUGMENTIN Stopped by: Virl Cagey, MD   guaiFENesin 600 MG 12 hr tablet Commonly known as: Mucinex Stopped by: Virl Cagey, MD   predniSONE 10 MG (21) Tbpk tablet Commonly known as: STERAPRED UNI-PAK 21 TAB Stopped by: Virl Cagey, MD       TAKE these medications    albuterol 108 (90 Base) MCG/ACT inhaler Commonly known as: ProAir HFA INHALE 2 PUFFS INTO THE LUNGS EVERY 6 (SIX) HOURS AS NEEDED FOR WHEEZING OR SHORTNESS OF BREATH.   amLODipine 5 MG tablet Commonly known as: NORVASC Take 1 tablet (5 mg total) by mouth daily. TO REPLACE METOPROLOL   atorvastatin 40 MG tablet Commonly known as: LIPITOR TAKE 1 TABLET BY MOUTH EVERY DAY   BIOTIN PO Take 1 capsule by mouth daily.   calcium carbonate 1500 (600 Ca) MG Tabs tablet Commonly known as: OSCAL Take by mouth.   diclofenac Sodium 1 % Gel Commonly known as: Voltaren Apply 2 g topically 4 (four) times  daily.   EPINEPHrine 0.3 mg/0.3 mL Soaj injection Commonly known as: EPI-PEN See admin instructions.   fluticasone 50 MCG/ACT nasal spray Commonly known as: FLONASE Place 2 sprays into both nostrils daily.   MULTI-VITAMIN PO Take 1 tablet by mouth daily.   pantoprazole 40 MG tablet Commonly known as: PROTONIX Take 1 tablet (40 mg total) by mouth 2 (two) times daily before a meal.   PROBIOTIC DAILY PO Take 1 tablet by mouth daily.   psyllium 58.6 % packet Commonly known as: METAMUCIL Take by mouth.   SUMAtriptan 100 MG tablet Commonly known as: IMITREX TAKE 1 TABLET AS NEED FOR MIGRAINE. MAY REPEAT IN 2 HOURS IF HEADACHE PERSISTS OR RECURS.   Symbicort 80-4.5 MCG/ACT inhaler Generic drug: budesonide-formoterol SMARTSIG:2 Puff(s) By Mouth Twice Daily   traZODone 150 MG  tablet Commonly known as: DESYREL TAKE 1 TABLET (150 MG TOTAL) BY MOUTH AT BEDTIME AS NEEDED FOR SLEEP.   trospium 20 MG tablet Commonly known as: SANCTURA TAKE 1 TABLET BY MOUTH TWICE A DAY   valsartan-hydrochlorothiazide 320-25 MG tablet Commonly known as: DIOVAN-HCT TAKE 1 TABLET BY MOUTH EVERY DAY         ROS:  A comprehensive review of systems was negative except for: Musculoskeletal: positive for lipoma on left ankle, right foot and left arm causing discomfort  Blood pressure 133/76, pulse 66, temperature 97.6 F (36.4 C), temperature source Other (Comment), resp. rate 14, height 5\' 9"  (1.753 m), weight 256 lb (116.1 kg), SpO2 92 %. Physical Exam Vitals reviewed.  Constitutional:      Appearance: Normal appearance.  HENT:     Head: Normocephalic.  Eyes:     Pupils: Pupils are equal, round, and reactive to light.  Cardiovascular:     Rate and Rhythm: Normal rate and regular rhythm.  Pulmonary:     Effort: Pulmonary effort is normal.     Breath sounds: Normal breath sounds.  Abdominal:     General: There is no distension.     Palpations: Abdomen is soft.     Tenderness: There is no abdominal tenderness.  Musculoskeletal:        General: No swelling.     Cervical back: Normal range of motion.     Comments: Left lateral malleolus with superficial fatty tissue asymmetrical to the right lateral malleolus anteriorly and posterior, prior incision healed, 2cm of fatty tissue approximated, right foot with lateral 1cm fatty mobile mass, left arm above the AC fossa with 1 cm superficial mobile mass consistent with lipoma versus cyst  Skin:    General: Skin is warm.  Neurological:     General: No focal deficit present.     Mental Status: She is alert and oriented to person, place, and time.  Psychiatric:        Mood and Affect: Mood normal.        Behavior: Behavior normal.        Thought Content: Thought content normal.        Judgment: Judgment normal.     Results: None    Assessment & Plan:  Kristina Mclaughlin is a 70 y.o. female with lipoma of the left ankle, right foot/ankle and left arm. Discussed excision with sedation in the operating room given the size and location and need for better cautery given the blood vessels associated with the one on the left. Discussed risk of bleeding, infection, recurrence.   -Plan for excision of the left arm, right and left ankle lipomas  All questions were answered to the satisfaction of the patient.    Virl Cagey 06/11/2021, 11:56 AM

## 2021-06-12 DIAGNOSIS — E7800 Pure hypercholesterolemia, unspecified: Secondary | ICD-10-CM | POA: Diagnosis not present

## 2021-06-12 DIAGNOSIS — H5203 Hypermetropia, bilateral: Secondary | ICD-10-CM | POA: Diagnosis not present

## 2021-06-13 NOTE — H&P (Signed)
[facility] History and Physical   Chief Complaint   Lipoma     Kristina Mclaughlin is a 70 y.o. female.  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 She had a deep lipoma with some associated vessels on the left ankle and all of this could not be removed previously. She says that these on her ankles /feet cause her to have issues with her shoes. She also has a new area on her left arm. She says that these areas bother her when they rub up against other things or from her shoes.   She denies any major changes in her medical history. She had the previous excision with just local but we discussed needing some sedation given the depth of the ankle lipoma on the left.   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, lateral back   Essential hypertension 10/29/2015   Hypertension    Insomnia     Past Surgical History:  Procedure Laterality Date   ABDOMINAL HYSTERECTOMY     BACK SURGERY     BREAST SURGERY     biopsy x2; reduction   EXAMINATION UNDER ANESTHESIA  08/27/2012   REDUCTION MAMMAPLASTY Bilateral 2002    Family History  Problem Relation Age of Onset   Bladder Cancer Mother    Allergies Mother    Heart disease Mother    Allergies Sister    Alzheimer's disease Father    Breast cancer Neg Hx     Social History   Tobacco Use   Smoking status: Never   Smokeless tobacco: Never  Vaping Use   Vaping Use: Never used  Substance Use Topics   Alcohol use: No    Alcohol/week: 0.0 standard drinks   Drug use: No    Medications: I have reviewed the patient's current medications. Allergies as of 06/11/2021       Reactions   Avelox Abc [moxifloxacin] Anaphylaxis   Lovenox [enoxaparin Sodium] Anaphylaxis   Quinolones Hives, Shortness Of Breath   Mucinex Sinus-max [phenylephrine-apap-guaifenesin] Swelling        Medication List         Accurate as of June 11, 2021 11:56 AM. If you have any questions, ask your nurse or doctor.          STOP taking these medications    amoxicillin-clavulanate 875-125 MG tablet Commonly known as: AUGMENTIN Stopped by: Virl Cagey, MD   guaiFENesin 600 MG 12 hr tablet Commonly known as: Mucinex Stopped by: Virl Cagey, MD   predniSONE 10 MG (21) Tbpk tablet Commonly known as: STERAPRED UNI-PAK 21 TAB Stopped by: Virl Cagey, MD       TAKE these medications    albuterol 108 (90 Base) MCG/ACT inhaler Commonly known as: ProAir HFA INHALE 2 PUFFS INTO THE LUNGS EVERY 6 (SIX) HOURS AS NEEDED FOR WHEEZING OR SHORTNESS OF BREATH.   amLODipine 5 MG tablet Commonly known as: NORVASC Take 1 tablet (5 mg total) by mouth daily. TO REPLACE METOPROLOL   atorvastatin 40 MG tablet Commonly known as: LIPITOR TAKE 1 TABLET BY MOUTH EVERY DAY   BIOTIN PO Take 1 capsule by mouth daily.   calcium carbonate 1500 (600 Ca) MG Tabs tablet Commonly known as: OSCAL Take by mouth.   diclofenac Sodium 1 % Gel Commonly known as: Voltaren Apply 2 g topically 4 (four) times  daily.   EPINEPHrine 0.3 mg/0.3 mL Soaj injection Commonly known as: EPI-PEN See admin instructions.   fluticasone 50 MCG/ACT nasal spray Commonly known as: FLONASE Place 2 sprays into both nostrils daily.   MULTI-VITAMIN PO Take 1 tablet by mouth daily.   pantoprazole 40 MG tablet Commonly known as: PROTONIX Take 1 tablet (40 mg total) by mouth 2 (two) times daily before a meal.   PROBIOTIC DAILY PO Take 1 tablet by mouth daily.   psyllium 58.6 % packet Commonly known as: METAMUCIL Take by mouth.   SUMAtriptan 100 MG tablet Commonly known as: IMITREX TAKE 1 TABLET AS NEED FOR MIGRAINE. MAY REPEAT IN 2 HOURS IF HEADACHE PERSISTS OR RECURS.   Symbicort 80-4.5 MCG/ACT inhaler Generic drug: budesonide-formoterol SMARTSIG:2 Puff(s) By Mouth Twice Daily   traZODone 150 MG  tablet Commonly known as: DESYREL TAKE 1 TABLET (150 MG TOTAL) BY MOUTH AT BEDTIME AS NEEDED FOR SLEEP.   trospium 20 MG tablet Commonly known as: SANCTURA TAKE 1 TABLET BY MOUTH TWICE A DAY   valsartan-hydrochlorothiazide 320-25 MG tablet Commonly known as: DIOVAN-HCT TAKE 1 TABLET BY MOUTH EVERY DAY         ROS:  A comprehensive review of systems was negative except for: Musculoskeletal: positive for lipoma on left ankle, right foot and left arm causing discomfort  Blood pressure 133/76, pulse 66, temperature 97.6 F (36.4 C), temperature source Other (Comment), resp. rate 14, height 5\' 9"  (1.753 m), weight 256 lb (116.1 kg), SpO2 92 %. Physical Exam Vitals reviewed.  Constitutional:      Appearance: Normal appearance.  HENT:     Head: Normocephalic.  Eyes:     Pupils: Pupils are equal, round, and reactive to light.  Cardiovascular:     Rate and Rhythm: Normal rate and regular rhythm.  Pulmonary:     Effort: Pulmonary effort is normal.     Breath sounds: Normal breath sounds.  Abdominal:     General: There is no distension.     Palpations: Abdomen is soft.     Tenderness: There is no abdominal tenderness.  Musculoskeletal:        General: No swelling.     Cervical back: Normal range of motion.     Comments: Left lateral malleolus with superficial fatty tissue asymmetrical to the right lateral malleolus anteriorly and posterior, prior incision healed, 2cm of fatty tissue approximated, right foot with lateral 1cm fatty mobile mass, left arm above the AC fossa with 1 cm superficial mobile mass consistent with lipoma versus cyst  Skin:    General: Skin is warm.  Neurological:     General: No focal deficit present.     Mental Status: She is alert and oriented to person, place, and time.  Psychiatric:        Mood and Affect: Mood normal.        Behavior: Behavior normal.        Thought Content: Thought content normal.        Judgment: Judgment normal.     Results: None    Assessment & Plan:  [name] is a 70 y.o. female with lipoma of the left ankle, right foot/ankle and left arm. Discussed excision with sedation in the operating room given the size and location and need for better cautery given the blood vessels associated with the one on the left. Discussed risk of bleeding, infection, recurrence.   -Plan for excision of the left arm, right and left ankle lipomas  All questions were answered to the satisfaction of the patient.    Virl Cagey 06/11/2021, 11:56 AM

## 2021-06-24 NOTE — Patient Instructions (Signed)
Your procedure is scheduled on: 07/03/2021  Report to [place] Entrance at  6:15   AM.  Call this number if you have problems the morning of surgery: [phone]   Remember:   Do not Eat or Drink after midnight         No Smoking the morning of surgery  :  Take these medicines the morning of surgery with A SIP OF WATER: Amlodipine, pantoprazole, allegra, and imitrex if needed   Do not wear jewelry, make-up or nail polish.  Do not wear lotions, powders, or perfumes. You may wear deodorant.  Do not shave 48 hours prior to surgery. Men may shave face and neck.  Do not bring valuables to the hospital.  Contacts, dentures or bridgework may not be worn into surgery.  Leave suitcase in the car. After surgery it may be brought to your room.  For patients admitted to the hospital, checkout time is 11:00 AM the day of discharge.   Patients discharged the day of surgery will not be allowed to drive home.    Special Instructions: Shower using CHG night before surgery and shower the day of surgery use CHG.  Use special wash - you have one bottle of CHG for all showers.  You should use approximately 1/2 of the bottle for each shower.  How to Use Chlorhexidine for Bathing Chlorhexidine gluconate (CHG) is a germ-killing (antiseptic) solution that is used to clean the skin. It can get rid of the bacteria that normally live on the skin and can keep them away for about 24 hours. To clean your skin with CHG, you may be given: A CHG solution to use in the shower or as part of a sponge bath. A prepackaged cloth that contains CHG. Cleaning your skin with CHG may help lower the risk for infection: While you are staying in the intensive care unit of the hospital. If you have a vascular access, such as a central line, to provide short-term or long-term access to your veins. If you have a catheter to drain urine from your bladder. If you are on a ventilator. A ventilator is a machine that helps you  breathe by moving air in and out of your lungs. After surgery. What are the risks? Risks of using CHG include: A skin reaction. Hearing loss, if CHG gets in your ears and you have a perforated eardrum. Eye injury, if CHG gets in your eyes and is not rinsed out. The CHG product catching fire. Make sure that you avoid smoking and flames after applying CHG to your skin. Do not use CHG: If you have a chlorhexidine allergy or have previously reacted to chlorhexidine. On babies younger than 41 months of age. How to use CHG solution Use CHG only as told by your health care provider, and follow the instructions on the label. Use the full amount of CHG as directed. Usually, this is one bottle. During a shower Follow these steps when using CHG solution during a shower (unless your health care provider gives you different instructions): Start the shower. Use your normal soap and shampoo to wash your face and hair. Turn off the shower or move out of the shower stream. Pour the CHG onto a clean washcloth. Do not use any type of brush or rough-edged sponge. Starting at your neck, lather your body down to your toes. Make sure you follow these instructions: If you will be having surgery, pay special attention to the part of your body where  you will be having surgery. Scrub this area for at least 1 minute. Do not use CHG on your head or face. If the solution gets into your ears or eyes, rinse them well with water. Avoid your genital area. Avoid any areas of skin that have broken skin, cuts, or scrapes. Scrub your back and under your arms. Make sure to wash skin folds. Let the lather sit on your skin for 1-2 minutes or as long as told by your health care provider. Thoroughly rinse your entire body in the shower. Make sure that all body creases and crevices are rinsed well. Dry off with a clean towel. Do not put any substances on your body afterward--such as powder, lotion, or perfume--unless you are told  to do so by your health care provider. Only use lotions that are recommended by the manufacturer. Put on clean clothes or pajamas. If it is the night before your surgery, sleep in clean sheets.  During a sponge bath Follow these steps when using CHG solution during a sponge bath (unless your health care provider gives you different instructions): Use your normal soap and shampoo to wash your face and hair. Pour the CHG onto a clean washcloth. Starting at your neck, lather your body down to your toes. Make sure you follow these instructions: If you will be having surgery, pay special attention to the part of your body where you will be having surgery. Scrub this area for at least 1 minute. Do not use CHG on your head or face. If the solution gets into your ears or eyes, rinse them well with water. Avoid your genital area. Avoid any areas of skin that have broken skin, cuts, or scrapes. Scrub your back and under your arms. Make sure to wash skin folds. Let the lather sit on your skin for 1-2 minutes or as long as told by your health care provider. Using a different clean, wet washcloth, thoroughly rinse your entire body. Make sure that all body creases and crevices are rinsed well. Dry off with a clean towel. Do not put any substances on your body afterward--such as powder, lotion, or perfume--unless you are told to do so by your health care provider. Only use lotions that are recommended by the manufacturer. Put on clean clothes or pajamas. If it is the night before your surgery, sleep in clean sheets. How to use CHG prepackaged cloths Only use CHG cloths as told by your health care provider, and follow the instructions on the label. Use the CHG cloth on clean, dry skin. Do not use the CHG cloth on your head or face unless your health care provider tells you to. When washing with the CHG cloth: Avoid your genital area. Avoid any areas of skin that have broken skin, cuts, or scrapes. Before  surgery Follow these steps when using a CHG cloth to clean before surgery (unless your health care provider gives you different instructions): Using the CHG cloth, vigorously scrub the part of your body where you will be having surgery. Scrub using a back-and-forth motion for 3 minutes. The area on your body should be completely wet with CHG when you are done scrubbing. Do not rinse. Discard the cloth and let the area air-dry. Do not put any substances on the area afterward, such as powder, lotion, or perfume. Put on clean clothes or pajamas. If it is the night before your surgery, sleep in clean sheets.  For general bathing Follow these steps when using CHG cloths for general  bathing (unless your health care provider gives you different instructions). Use a separate CHG cloth for each area of your body. Make sure you wash between any folds of skin and between your fingers and toes. Wash your body in the following order, switching to a new cloth after each step: The front of your neck, shoulders, and chest. Both of your arms, under your arms, and your hands. Your stomach and groin area, avoiding the genitals. Your right leg and foot. Your left leg and foot. The back of your neck, your back, and your buttocks. Do not rinse. Discard the cloth and let the area air-dry. Do not put any substances on your body afterward--such as powder, lotion, or perfume--unless you are told to do so by your health care provider. Only use lotions that are recommended by the manufacturer. Put on clean clothes or pajamas. Contact a health care provider if: Your skin gets irritated after scrubbing. You have questions about using your solution or cloth. You swallow any chlorhexidine. Call your local poison control center (1-(952) 668-7348 in the [place] Get help right away if: Your eyes itch badly, or they become very red or swollen. Your skin itches badly and is red or swollen. Your hearing changes. You have trouble  seeing. You have swelling or tingling in your mouth or throat. You have trouble breathing. These symptoms may represent a serious problem that is an emergency. Do not wait to see if the symptoms will go away. Get medical help right away. Call your local emergency services (911 in the [place] Do not drive yourself to the hospital. Summary Chlorhexidine gluconate (CHG) is a germ-killing (antiseptic) solution that is used to clean the skin. Cleaning your skin with CHG may help to lower your risk for infection. You may be given CHG to use for bathing. It may be in a bottle or in a prepackaged cloth to use on your skin. Carefully follow your health care provider's instructions and the instructions on the product label. Do not use CHG if you have a chlorhexidine allergy. Contact your health care provider if your skin gets irritated after scrubbing. This information is not intended to replace advice given to you by your health care provider. Make sure you discuss any questions you have with your health care provider. Document Revised: 10/01/2020 Document Reviewed: 10/01/2020 Elsevier Patient Education  2022 [place] Sutures are stitches that can be used to close wounds. Some stitches break down as they heal (absorbable). Other stitches need to be taken out by your doctor (nonabsorbable). Taking good care of your wound can help to prevent pain and infection. It can also help your wound heal more quickly. Follow instructions from your doctor about how to care for your sutured wound. Supplies needed: Soap and water. A clean, dry towel. Solution to clean your wound, if needed. A clean gauze or bandage (dressing), if needed. Antibiotic ointment, if told by your doctor. How to care for your sutured wound  Keep the wound fully dry for the first 24 hours or as long as told by your doctor. After 24-48 hours, you may shower or bathe as told by your doctor. Do not soak the wound or put  the wound under water until the stitches have been taken out. After the first 24 hours, clean the wound once a day, or as often as your doctor tells you to. Take these steps: Wash and rinse the wound as told by your health care provider. Pat the wound dry  with a clean towel. Do not rub the wound. After cleaning the wound, put a thin layer of antibiotic ointment on the wound as told by your doctor. This will help: Prevent infection. Keep the bandage from sticking to the wound. Follow instructions from your doctor about how to change your bandage. Make sure you: Wash your hands with soap and water for at least 20 seconds. If you cannot use soap and water, use hand sanitizer. Change your bandage at least once a day, or as often as told by your doctor. If your dressing gets wet or dirty, change it. Leavestitches in place for at least 2 weeks. If you have skin glue over your stitches, this should also stay in place for at least 2 weeks. Leave tape strips alone (if you have them) unless you are told to take them off. You may trim the edges of the tape strips if they curl up. Check your wound every day for signs of infection. Watch for: Redness, swelling, or pain. Fluid or blood. New warmth, a rash, or hardness at the wound site. Pus or a bad smell. Have the stitches taken out as told by your doctor. Follow these instructions at home: Medicines Take or apply over-the-counter and prescription medicines only as told by your doctor. If you were prescribed an antibiotic medicine or ointment, take or apply it as told by your doctor. Do not stop using the antibiotic even if you start to feel better. General instructions Cover your wound with clothes or put sunscreen on when you are outside. Use a sunscreen of at least 30 SPF. Do not scratch or pick at your wound. Avoid stretching your wound. Raise the injured area above the level of your heart while you are sitting or lying down, if possible. Eat a  diet that includes protein, vitamin A, and vitamin C. Doing this will help your wound heal. Drink enough fluid to keep your pee (urine) pale yellow. Keep all follow-up visits. Contact a doctor if: You were given a tetanus shot and you have any of the following at the site where the needle went in: Swelling. Very bad pain. Redness. Bleeding. Your wound breaks open. You see something coming out of your wound, such as wood or glass. You have any of these signs of infection in or around your wound: Redness, swelling, or pain. Fluid or blood. Warmth. A new rash. Your wound feels hard. You have a fever. The skin near your wound changes color. You have pain that does not get better with medicine. You get numbness around the wound. Get help right away if: You have very bad swelling or more pain around your wound. You have pus or a bad smell coming from your wound. You have painful lumps near your wound or anywhere on your body. You have a red streak going away from your wound. The wound is on your hand or foot, and: Your fingers or toes look pale or blue. You cannot move a finger or toe as you used to do. You have numbness that spreads down your hand, foot, fingers, or toes. Summary Sutures are stitches that are used to close wounds. Taking good care of your wound can help to prevent pain and infection. Keep the wound fully dry for the first 24 hours or for as long as told by your doctor. After 24-48 hours, you may shower or bathe as told by your doctor. This information is not intended to replace advice given to you by your health  care provider. Make sure you discuss any questions you have with your health care provider. Document Revised: 11/26/2020 Document Reviewed: 11/26/2020 Elsevier Patient Education  2022 [place] Anesthesia, Adult, Care After This sheet gives you information about how to care for yourself after your procedure. Your health care provider may also  give you more specific instructions. If you have problems or questions, contact your health care provider. What can I expect after the procedure? After the procedure, the following side effects are common: Pain or discomfort at the IV site. Nausea. Vomiting. Sore throat. Trouble concentrating. Feeling cold or chills. Feeling weak or tired. Sleepiness and fatigue. Soreness and body aches. These side effects can affect parts of the body that were not involved in surgery. Follow these instructions at home: For the time period you were told by your health care provider:  Rest. Do not participate in activities where you could fall or become injured. Do not drive or use machinery. Do not drink alcohol. Do not take sleeping pills or medicines that cause drowsiness. Do not make important decisions or sign legal documents. Do not take care of children on your own. Eating and drinking Follow any instructions from your health care provider about eating or drinking restrictions. When you feel hungry, start by eating small amounts of foods that are soft and easy to digest (bland), such as toast. Gradually return to your regular diet. Drink enough fluid to keep your urine pale yellow. If you vomit, rehydrate by drinking water, juice, or clear broth. General instructions If you have sleep apnea, surgery and certain medicines can increase your risk for breathing problems. Follow instructions from your health care provider about wearing your sleep device: Anytime you are sleeping, including during daytime naps. While taking prescription pain medicines, sleeping medicines, or medicines that make you drowsy. Have a responsible adult stay with you for the time you are told. It is important to have someone help care for you until you are awake and alert. Return to your normal activities as told by your health care provider. Ask your health care provider what activities are safe for you. Take  over-the-counter and prescription medicines only as told by your health care provider. If you smoke, do not smoke without supervision. Keep all follow-up visits as told by your health care provider. This is important. Contact a health care provider if: You have nausea or vomiting that does not get better with medicine. You cannot eat or drink without vomiting. You have pain that does not get better with medicine. You are unable to pass urine. You develop a skin rash. You have a fever. You have redness around your IV site that gets worse. Get help right away if: You have difficulty breathing. You have chest pain. You have blood in your urine or stool, or you vomit blood. Summary After the procedure, it is common to have a sore throat or nausea. It is also common to feel tired. Have a responsible adult stay with you for the time you are told. It is important to have someone help care for you until you are awake and alert. When you feel hungry, start by eating small amounts of foods that are soft and easy to digest (bland), such as toast. Gradually return to your regular diet. Drink enough fluid to keep your urine pale yellow. Return to your normal activities as told by your health care provider. Ask your health care provider what activities are safe for you. This information is not  intended to replace advice given to you by your health care provider. Make sure you discuss any questions you have with your health care provider. Document Revised: 04/05/2020 Document Reviewed: 11/03/2019 Elsevier Patient Education  2022 [employer].

## 2021-07-01 ENCOUNTER — Encounter (HOSPITAL_COMMUNITY)
Admission: RE | Admit: 2021-07-01 | Discharge: 2021-07-01 | Disposition: A | Discharge status: Home / Self Care | Payer: Medicare HMO | Source: Ambulatory Visit | Attending: General Surgery | Admitting: General Surgery

## 2021-07-01 ENCOUNTER — Other Ambulatory Visit: Payer: Self-pay

## 2021-07-01 ENCOUNTER — Encounter (HOSPITAL_COMMUNITY): Payer: Self-pay

## 2021-07-01 VITALS — BP 160/87 | HR 89 | Temp 97.9°F | Resp 16 | Wt 256.0 lb

## 2021-07-01 DIAGNOSIS — Z79899 Other long term (current) drug therapy: Secondary | ICD-10-CM | POA: Insufficient documentation

## 2021-07-01 DIAGNOSIS — Z01818 Encounter for other preprocedural examination: Secondary | ICD-10-CM | POA: Diagnosis not present

## 2021-07-01 HISTORY — DX: Unspecified asthma, uncomplicated: J45.909

## 2021-07-01 LAB — BASIC METABOLIC PANEL
Anion gap: 10 (ref 5–15)
BUN: 14 mg/dL (ref 8–23)
CO2: 24 mmol/L (ref 22–32)
Calcium: 9 mg/dL (ref 8.9–10.3)
Chloride: 102 mmol/L (ref 98–111)
Creatinine, Ser: 0.89 mg/dL (ref 0.44–1.00)
GFR, Estimated: >60 mL/min (ref >60)
Glucose, Bld: 234 mg/dL — ABNORMAL HIGH (ref 70–99)
Potassium: 3.3 mmol/L — ABNORMAL LOW (ref 3.5–5.1)
Sodium: 136 mmol/L (ref 135–145)

## 2021-07-02 ENCOUNTER — Encounter (HOSPITAL_COMMUNITY): Payer: Self-pay | Admitting: General Surgery

## 2021-07-02 DIAGNOSIS — J3089 Other allergic rhinitis: Secondary | ICD-10-CM | POA: Diagnosis not present

## 2021-07-02 DIAGNOSIS — J301 Allergic rhinitis due to pollen: Secondary | ICD-10-CM | POA: Diagnosis not present

## 2021-07-02 DIAGNOSIS — J3081 Allergic rhinitis due to animal (cat) (dog) hair and dander: Secondary | ICD-10-CM | POA: Diagnosis not present

## 2021-07-02 NOTE — Anesthesia Preprocedure Evaluation (Signed)
Anesthesia Evaluation  Patient identified by MRN, date of birth, ID band Patient awake    Reviewed: Allergy & Precautions, H&P , NPO status , Patient's Chart, lab work & pertinent test results, reviewed documented beta blocker date and time   Airway Mallampati: II  TM Distance: >3 FB Neck ROM: full    Dental no notable dental hx.    Pulmonary asthma ,    Pulmonary exam normal breath sounds clear to auscultation       Cardiovascular Exercise Tolerance: Good hypertension, negative cardio ROS   Rhythm:regular Rate:Normal     Neuro/Psych negative neurological ROS  negative psych ROS   GI/Hepatic negative GI ROS, Neg liver ROS,   Endo/Other  diabetes, Poorly ControlledMorbid obesity  Renal/GU negative Renal ROS  negative genitourinary   Musculoskeletal   Abdominal   Peds  Hematology negative hematology ROS (+)   Anesthesia Other Findings   Reproductive/Obstetrics negative OB ROS                             Anesthesia Physical Anesthesia Plan  ASA: 2  Anesthesia Plan: General and General LMA   Post-op Pain Management:    Induction:   PONV Risk Score and Plan: Ondansetron  Airway Management Planned:   Additional Equipment:   Intra-op Plan:   Post-operative Plan:   Informed Consent: I have reviewed the patients History and Physical, chart, labs and discussed the procedure including the risks, benefits and alternatives for the proposed anesthesia with the patient or authorized representative who has indicated his/her understanding and acceptance.     Dental Advisory Given  Plan Discussed with: CRNA  Anesthesia Plan Comments:         Anesthesia Quick Evaluation

## 2021-07-03 ENCOUNTER — Encounter (HOSPITAL_COMMUNITY)
Admission: RE | Disposition: A | Discharge status: Home / Self Care | Payer: Self-pay | Source: Home / Self Care | Attending: General Surgery

## 2021-07-03 ENCOUNTER — Ambulatory Visit (HOSPITAL_COMMUNITY): Payer: Medicare HMO | Admitting: Anesthesiology

## 2021-07-03 ENCOUNTER — Ambulatory Visit (HOSPITAL_COMMUNITY)
Admission: RE | Admit: 2021-07-03 | Discharge: 2021-07-03 | Disposition: A | Discharge status: Home / Self Care | Payer: Medicare HMO | Attending: General Surgery | Admitting: General Surgery

## 2021-07-03 DIAGNOSIS — D179 Benign lipomatous neoplasm, unspecified: Secondary | ICD-10-CM | POA: Diagnosis present

## 2021-07-03 DIAGNOSIS — D171 Benign lipomatous neoplasm of skin and subcutaneous tissue of trunk: Secondary | ICD-10-CM | POA: Diagnosis not present

## 2021-07-03 DIAGNOSIS — D1723 Benign lipomatous neoplasm of skin and subcutaneous tissue of right leg: Secondary | ICD-10-CM | POA: Diagnosis not present

## 2021-07-03 DIAGNOSIS — D1779 Benign lipomatous neoplasm of other sites: Secondary | ICD-10-CM

## 2021-07-03 DIAGNOSIS — D1724 Benign lipomatous neoplasm of skin and subcutaneous tissue of left leg: Secondary | ICD-10-CM

## 2021-07-03 DIAGNOSIS — D1722 Benign lipomatous neoplasm of skin and subcutaneous tissue of left arm: Secondary | ICD-10-CM | POA: Diagnosis not present

## 2021-07-03 DIAGNOSIS — I10 Essential (primary) hypertension: Secondary | ICD-10-CM | POA: Diagnosis not present

## 2021-07-03 DIAGNOSIS — J45909 Unspecified asthma, uncomplicated: Secondary | ICD-10-CM | POA: Insufficient documentation

## 2021-07-03 DIAGNOSIS — E119 Type 2 diabetes mellitus without complications: Secondary | ICD-10-CM | POA: Insufficient documentation

## 2021-07-03 DIAGNOSIS — D1720 Benign lipomatous neoplasm of skin and subcutaneous tissue of unspecified limb: Secondary | ICD-10-CM

## 2021-07-03 HISTORY — PX: LIPOMA EXCISION: SHX5283

## 2021-07-03 HISTORY — PX: MASS EXCISION: SHX2000

## 2021-07-03 LAB — GLUCOSE, CAPILLARY: Glucose-Capillary: 133 mg/dL — ABNORMAL HIGH (ref 70–99)

## 2021-07-03 SURGERY — EXCISION LIPOMA
Anesthesia: General | Site: Groin | Laterality: Right

## 2021-07-03 MED ORDER — FENTANYL CITRATE (PF) 100 MCG/2ML IJ SOLN
INTRAMUSCULAR | Status: AC
  Filled 2021-07-03: qty 2

## 2021-07-03 MED ORDER — ONDANSETRON HCL 4 MG PO TABS: 4.0000 mg | ORAL_TABLET | Freq: Three times a day (TID) | ORAL | 1 refills | Status: AC | PRN

## 2021-07-03 MED ORDER — LACTATED RINGERS IV SOLN: INTRAVENOUS | Status: DC

## 2021-07-03 MED ORDER — BUPIVACAINE HCL (PF) 0.5 % IJ SOLN
INTRAMUSCULAR | Status: DC | PRN
  Administered 2021-07-03: 3 mL
  Administered 2021-07-03: 5 mL
  Administered 2021-07-03: 2 mL
  Administered 2021-07-03: 5 mL

## 2021-07-03 MED ORDER — EPHEDRINE 5 MG/ML INJ
INTRAVENOUS | Status: AC
  Filled 2021-07-03: qty 5

## 2021-07-03 MED ORDER — CHLORHEXIDINE GLUCONATE CLOTH 2 % EX PADS: 6.0000 | MEDICATED_PAD | Freq: Once | CUTANEOUS | Status: DC

## 2021-07-03 MED ORDER — OXYCODONE HCL 5 MG PO TABS: 5.0000 mg | ORAL_TABLET | ORAL | 0 refills | Status: DC | PRN

## 2021-07-03 MED ORDER — LIDOCAINE HCL (PF) 2 % IJ SOLN
INTRAMUSCULAR | Status: AC
  Filled 2021-07-03: qty 5

## 2021-07-03 MED ORDER — LIDOCAINE HCL (CARDIAC) PF 100 MG/5ML IV SOSY
PREFILLED_SYRINGE | INTRAVENOUS | Status: DC | PRN
  Administered 2021-07-03: 50 mg via INTRAVENOUS

## 2021-07-03 MED ORDER — ONDANSETRON HCL 4 MG/2ML IJ SOLN
INTRAMUSCULAR | Status: AC
  Filled 2021-07-03: qty 2

## 2021-07-03 MED ORDER — 0.9 % SODIUM CHLORIDE (POUR BTL) OPTIME
TOPICAL | Status: DC | PRN
  Administered 2021-07-03: 1000 mL

## 2021-07-03 MED ORDER — PROPOFOL 10 MG/ML IV BOLUS
INTRAVENOUS | Status: DC | PRN
  Administered 2021-07-03: 160 mg via INTRAVENOUS

## 2021-07-03 MED ORDER — BUPIVACAINE HCL (PF) 0.5 % IJ SOLN
INTRAMUSCULAR | Status: AC
  Filled 2021-07-03: qty 30

## 2021-07-03 MED ORDER — ONDANSETRON HCL 4 MG/2ML IJ SOLN
INTRAMUSCULAR | Status: DC | PRN
  Administered 2021-07-03: 4 mg via INTRAVENOUS

## 2021-07-03 MED ORDER — CHLORHEXIDINE GLUCONATE 0.12 % MT SOLN
15.0000 mL | Freq: Once | OROMUCOSAL | Status: AC
  Administered 2021-07-03: 15 mL via OROMUCOSAL

## 2021-07-03 MED ORDER — MIDAZOLAM HCL 5 MG/5ML IJ SOLN
INTRAMUSCULAR | Status: DC | PRN
  Administered 2021-07-03: 2 mg via INTRAVENOUS

## 2021-07-03 MED ORDER — ORAL CARE MOUTH RINSE: 15.0000 mL | Freq: Once | OROMUCOSAL | Status: AC

## 2021-07-03 MED ORDER — EPHEDRINE SULFATE 50 MG/ML IJ SOLN
INTRAMUSCULAR | Status: DC | PRN
  Administered 2021-07-03 (×4): 5 mg via INTRAVENOUS

## 2021-07-03 MED ORDER — PROPOFOL 10 MG/ML IV BOLUS
INTRAVENOUS | Status: AC
  Filled 2021-07-03: qty 20

## 2021-07-03 MED ORDER — MIDAZOLAM HCL 2 MG/2ML IJ SOLN
INTRAMUSCULAR | Status: AC
  Filled 2021-07-03: qty 2

## 2021-07-03 MED ORDER — FENTANYL CITRATE (PF) 100 MCG/2ML IJ SOLN
INTRAMUSCULAR | Status: DC | PRN
  Administered 2021-07-03: 100 ug via INTRAVENOUS

## 2021-07-03 SURGICAL SUPPLY — 35 items
ADH SKN CLS APL DERMABOND .7 (GAUZE/BANDAGES/DRESSINGS) ×16
APL PRP STRL LF DISP 70% ISPRP (MISCELLANEOUS) ×8
APL PRP STRL LF ISPRP CHG 10.5 (MISCELLANEOUS) ×8
APPLICATOR CHLORAPREP 10.5 ORG (MISCELLANEOUS) ×6 IMPLANT
CHLORAPREP W/TINT 26 (MISCELLANEOUS) ×6 IMPLANT
CLOTH BEACON ORANGE TIMEOUT ST (SAFETY) ×5 IMPLANT
COVER LIGHT HANDLE STERIS (MISCELLANEOUS) ×10 IMPLANT
DERMABOND ADVANCED (GAUZE/BANDAGES/DRESSINGS) ×4
DERMABOND ADVANCED .7 DNX12 (GAUZE/BANDAGES/DRESSINGS) IMPLANT
DRAPE BILATERAL LIMB T (DRAPES) ×1 IMPLANT
DRAPE HALF SHEET 40X57 (DRAPES) ×2 IMPLANT
ELECT REM PT RETURN 9FT ADLT (ELECTROSURGICAL) ×5
ELECTRODE REM PT RTRN 9FT ADLT (ELECTROSURGICAL) ×4 IMPLANT
GAUZE 4X4 16PLY ~~LOC~~+RFID DBL (SPONGE) ×1 IMPLANT
GLOVE SURG ENC MOIS LTX SZ6.5 (GLOVE) ×5 IMPLANT
GLOVE SURG UNDER POLY LF SZ7 (GLOVE) ×13 IMPLANT
GOWN STRL REUS W/TWL LRG LVL3 (GOWN DISPOSABLE) ×10 IMPLANT
KIT TURNOVER KIT A (KITS) ×5 IMPLANT
MANIFOLD NEPTUNE II (INSTRUMENTS) ×5 IMPLANT
NDL HYPO 18GX1.5 BLUNT FILL (NEEDLE) IMPLANT
NDL HYPO 25X1 1.5 SAFETY (NEEDLE) ×4 IMPLANT
NEEDLE HYPO 18GX1.5 BLUNT FILL (NEEDLE) ×5 IMPLANT
NEEDLE HYPO 25X1 1.5 SAFETY (NEEDLE) ×5 IMPLANT
NS IRRIG 1000ML POUR BTL (IV SOLUTION) ×5 IMPLANT
PACK MINOR (CUSTOM PROCEDURE TRAY) ×5 IMPLANT
PAD ARMBOARD 7.5X6 YLW CONV (MISCELLANEOUS) ×5 IMPLANT
PAD TELFA 3X4 1S STER (GAUZE/BANDAGES/DRESSINGS) ×1 IMPLANT
PENCIL HANDSWITCHING (ELECTRODE) ×1 IMPLANT
SET BASIN LINEN APH (SET/KITS/TRAYS/PACK) ×5 IMPLANT
SUT MNCRL AB 4-0 PS2 18 (SUTURE) ×6 IMPLANT
SUT VIC AB 3-0 SH 27 (SUTURE) ×15
SUT VIC AB 3-0 SH 27X BRD (SUTURE) ×4 IMPLANT
SYR 30ML LL (SYRINGE) ×1 IMPLANT
SYR BULB IRRIG 60ML STRL (SYRINGE) ×5 IMPLANT
SYR CONTROL 10ML LL (SYRINGE) ×5 IMPLANT

## 2021-07-03 NOTE — Op Note (Signed)
[facility] Operative Note  07/03/21  Preoperative Diagnosis: Lipoma left arm, lipoma left and right ankles, lipoma suprapubic region    Postoperative Diagnosis: Same   Procedure(s) Performed: Excision of lipomas from left arm 1cm, left and right ankle 4cm, and suprapubic area 2cm    Surgeon: Lanell Matar. Constance Haw, MD   Assistants: No qualified resident was available    Anesthesia: General LMA   Anesthesiologist: Louann Sjogren, MD    Specimens: Lipoma left arm, left and right ankle, suprapubic    Estimated Blood Loss: Minimal   Blood Replacement: None    Complications: None   Wound Class: Clean    Operative Indications: Ms. Dulac is a 70 yo with multiple lipomas that I have removed in the past and more that are causing her discomfort. Discussed excision and risk of bleeding, infection, recurrence, and risk of not being able to get all of the ones on the ankles due to the location.   Findings: Lipomas of the left arm, suprapubic area; lipoma of the right ankle, lipoma of the left ankle    Procedure: The patient was taken to the operating room and placed supine. General anesthesia was induced. Intravenous antibiotics were not administered as this is a clean case. The left arm was prepared and draped in the usual sterile fashion.   Lidocaine was injected over the field. An incision was made and carried down to the lipoma. The lipoma was extracted using a combination of sharp scissor dissection and blunt dissection. The tissue was sent to pathology. The cavity was made hemostatic and closed with 3-0 Vicryl and 4-0 Monocryl subcuticular and dermabond. It measured 1cm.   Attention was then turned to the suprapubic area which was prepped and draped in the normal sterile fashion. The lipoma was excised in the same way as described above and closed in the same fashion. It measured 2cm.   Attention was then turned to the bilateral ankles. These were prepped and draped  in the normal sterile fashion.  The right ankle lipoma was 2cm in size and the left ankle lipoma was 2cm in size. Both of these lipomas were excised in the same way described above but there were small superficial blood vessels that were ligated and made hemostatic. I took as much tissue around the left ankle as I was comfortable taking as I did not want to get deep and only stayed in the superficial planes.  The incisions were closed in the same fashion described above.   Final inspection revealed acceptable hemostasis. All counts were correct at the end of the case. The patient was awakened from anesthesia and extubated without complication.  The patient went to the PACU in stable condition.   Curlene Labrum, MD [facility] [address] [place], [place] [address] [phone] (office)

## 2021-07-03 NOTE — Progress Notes (Signed)
[facility]  Updated husband, [facility]. Dermabond over sutures. Will see in 2 weeks.  Curlene Labrum, MD [facility] [address] [place], [place] [address] [phone] (office)

## 2021-07-03 NOTE — Anesthesia Procedure Notes (Signed)
Procedure Name: LMA Insertion Date/Time: 07/03/2021 7:35 AM Performed by: Jonna Munro, CRNA Pre-anesthesia Checklist: Patient identified, Emergency Drugs available, Suction available, Patient being monitored and Timeout performed Patient Re-evaluated:Patient Re-evaluated prior to induction Oxygen Delivery Method: Circle system utilized Preoxygenation: Pre-oxygenation with 100% oxygen Induction Type: IV induction LMA: LMA inserted LMA Size: 4.0 Number of attempts: 1 Tube secured with: Tape Dental Injury: Teeth and Oropharynx as per pre-operative assessment

## 2021-07-03 NOTE — Interval H&P Note (Signed)
History and Physical Interval Note:  07/03/2021 7:22 AM  Kristina Mclaughlin  has presented today for surgery, with the diagnosis of Lipoma right ankle Lipoma left ankle Lipoma left arm.  The various methods of treatment have been discussed with the patient and family. After consideration of risks, benefits and other options for treatment, the patient has consented to  Procedure(s): EXCISION LIPOMA; ANKLE (Right) EXCISION LIPOMA; ANKLE (Left) EXCISION OF LIPOMA; ARM (Left) as a surgical intervention.  The patient's history has been reviewed, patient examined, no change in status, stable for surgery.  I have reviewed the patient's chart and labs.  Questions were answered to the patient's satisfaction.    Added a lipoma on her suprapubic region, area marked. Will add to consent.  Virl Cagey

## 2021-07-03 NOTE — Anesthesia Postprocedure Evaluation (Signed)
Anesthesia Post Note  Patient: Kristina Mclaughlin  Procedure(s) Performed: EXCISION LIPOMA; ANKLE (Right: Ankle) EXCISION LIPOMA; ANKLE (Left: Ankle) EXCISION OF LIPOMA; ARM (Left: Arm Upper) EXCISION LIPOMA; SUPRAPUBIC (Groin)  Patient location during evaluation: Phase II Anesthesia Type: General Level of consciousness: awake Pain management: pain level controlled Vital Signs Assessment: post-procedure vital signs reviewed and stable Respiratory status: spontaneous breathing and respiratory function stable Cardiovascular status: blood pressure returned to baseline and stable Postop Assessment: no headache and no apparent nausea or vomiting Anesthetic complications: no Comments: Late entry   No notable events documented.   Last Vitals:  Vitals:   07/03/21 0915 07/03/21 0923  BP: 134/71 140/72  Pulse: 65 68  Resp: 14 14  Temp: 36.6 C 36.6 C  SpO2: 98% 94%    Last Pain:  Vitals:   07/03/21 0923  TempSrc: Oral  PainSc: 0-No pain                 Louann Sjogren

## 2021-07-03 NOTE — Transfer of Care (Signed)
Immediate Anesthesia Transfer of Care Note  Patient: Kristina Mclaughlin  Procedure(s) Performed: EXCISION LIPOMA; ANKLE (Right: Ankle) EXCISION LIPOMA; ANKLE (Left: Ankle) EXCISION OF LIPOMA; ARM (Left: Arm Upper) EXCISION LIPOMA; SUPRAPUBIC (Groin)  Patient Location: PACU  Anesthesia Type:General  Level of Consciousness: awake, alert , oriented and patient cooperative  Airway & Oxygen Therapy: Patient Spontanous Breathing  Post-op Assessment: Report given to RN, Post -op Vital signs reviewed and stable and Patient moving all extremities X 4  Post vital signs: Reviewed and stable  Last Vitals:  Vitals Value Taken Time  BP 140/67 07/03/21 0900  Temp 36.6 C 07/03/21 0858  Pulse 65 07/03/21 0906  Resp 12 07/03/21 0906  SpO2 93 % 07/03/21 0906  Vitals shown include unvalidated device data.  Last Pain:  Vitals:   07/03/21 0858  PainSc: 0-No pain         Complications: No notable events documented.

## 2021-07-03 NOTE — Discharge Instructions (Signed)
Discharge Instructions:  Common Complaints: Pain at the incision site is common. Bruising is common. Use on the areas.  Some nausea is common and poor appetite. The main goal is to stay hydrated the first few days after surgery.   Diet/ Activity: Diet as tolerated. You may not have an appetite, but it is important to stay hydrated. Drink 64 ounces of water a day. Your appetite will return with time.  Shower per your regular routine daily.  Do not take hot showers. Take warm showers that are less than 10 minutes. Walk everyday for at least 15-20 minutes. Deep cough and move around every 1-2 hours in the first few days after surgery.  Limit excessive movement, lifting > 10 lbs, stretching with the limb if there is an incision on your arm/armpit or leg.   Limit stretching, pulling on your incision if it is located on other parts of your body.  Do not pick at the dermabond glue on your incision sites. This glue film will remain in place for 1-2 weeks and will start to peel off.  Do not place lotions or balms on your incision unless instructed to specifically by Dr. Constance Haw.   Medication: Take tylenol and ibuprofen as needed for pain control, alternating every 4-6 hours.  Example:  Tylenol 1000mg  @ 6am, 12noon, 6pm, 28midnight (Do not exceed 4000mg  of tylenol a day). Ibuprofen 800mg  @ 9am, 3pm, 9pm, 3am (Do not exceed 3600mg  of ibuprofen a day).  Take Roxicodone for breakthrough pain every 4 hours.  Take Colace for constipation related to narcotic pain medication. If you do not have a bowel movement in 2 days, take Miralax over the counter.  Drink plenty of water to also prevent constipation.   Contact Information: If you have questions or concerns, please call our office, [phone], Monday- Thursday 8AM-5PM and Friday 8AM-12Noon.  If it is after hours or on the weekend, please call [facility], [phone], [phone], and ask to speak to the surgeon on call for Dr. Constance Haw at [facility].

## 2021-07-04 ENCOUNTER — Encounter (HOSPITAL_COMMUNITY): Payer: Self-pay | Admitting: General Surgery

## 2021-07-04 LAB — SURGICAL PATHOLOGY

## 2021-07-08 ENCOUNTER — Other Ambulatory Visit: Payer: Self-pay | Admitting: Family Medicine

## 2021-07-08 DIAGNOSIS — Z1231 Encounter for screening mammogram for malignant neoplasm of breast: Secondary | ICD-10-CM

## 2021-07-15 ENCOUNTER — Telehealth: Payer: Self-pay | Admitting: Family Medicine

## 2021-07-15 ENCOUNTER — Other Ambulatory Visit: Payer: Self-pay | Admitting: Family Medicine

## 2021-07-15 DIAGNOSIS — J301 Allergic rhinitis due to pollen: Secondary | ICD-10-CM | POA: Diagnosis not present

## 2021-07-15 DIAGNOSIS — J3089 Other allergic rhinitis: Secondary | ICD-10-CM | POA: Diagnosis not present

## 2021-07-15 DIAGNOSIS — I10 Essential (primary) hypertension: Secondary | ICD-10-CM

## 2021-07-15 DIAGNOSIS — J3081 Allergic rhinitis due to animal (cat) (dog) hair and dander: Secondary | ICD-10-CM | POA: Diagnosis not present

## 2021-07-15 DIAGNOSIS — K219 Gastro-esophageal reflux disease without esophagitis: Secondary | ICD-10-CM

## 2021-07-15 MED ORDER — PANTOPRAZOLE SODIUM 40 MG PO TBEC: 40.0000 mg | DELAYED_RELEASE_TABLET | Freq: Two times a day (BID) | ORAL | 0 refills | Status: DC

## 2021-07-15 NOTE — Telephone Encounter (Signed)
Refill sent to pharmacy & 6 mos ckup for Feb made

## 2021-07-15 NOTE — Telephone Encounter (Signed)
  Prescription Request  07/15/2021  Is this a "Controlled Substance" medicine? no  Have you seen your PCP in the last 2 weeks? no  If YES, route message to pool  -  If NO, patient needs to be scheduled for appointment.  What is the name of the medication or equipment? Stomach/GERD meds Pantoprazole-40mg ? She thinks it is this one  Have you contacted your pharmacy to request a refill? no   Which pharmacy would you like this sent to? [facility]   Patient notified that their request is being sent to the clinical staff for review and that they should receive a response within 2 business days.    [facility] pt.  90 day supply Twice per day

## 2021-07-16 ENCOUNTER — Encounter: Payer: Medicare HMO | Admitting: General Surgery

## 2021-07-18 ENCOUNTER — Other Ambulatory Visit: Payer: Self-pay

## 2021-07-18 ENCOUNTER — Ambulatory Visit (INDEPENDENT_AMBULATORY_CARE_PROVIDER_SITE_OTHER): Payer: Medicare HMO | Admitting: General Surgery

## 2021-07-18 ENCOUNTER — Encounter: Payer: Medicare HMO | Admitting: General Surgery

## 2021-07-18 ENCOUNTER — Encounter: Payer: Self-pay | Admitting: General Surgery

## 2021-07-18 VITALS — BP 166/84 | HR 76 | Temp 98.4°F | Resp 16 | Ht 69.0 in | Wt 247.0 lb

## 2021-07-18 DIAGNOSIS — D1720 Benign lipomatous neoplasm of skin and subcutaneous tissue of unspecified limb: Secondary | ICD-10-CM

## 2021-07-18 NOTE — Patient Instructions (Signed)
Let us know if any issues.

## 2021-07-19 NOTE — Progress Notes (Signed)
[facility]  Doing well and no major issues. Still feels like left ankle is swollen some with standing on her feet.  BP (!) 166/84    Pulse 76    Temp 98.4 F (36.9 C) (Other (Comment))    Resp 16    Ht 5\' 9"  (1.753 m)    Wt 247 lb (112 kg)    SpO2 90%    BMI 36.48 kg/m  Healing sites at the left ankle, right foot, left arm and suprapubic area No signs of infection  Pathology all with lipomas.  Patient s/p lipoma excision. Doing well PRN follow up  Curlene Labrum, MD [facility] [address] [place], [place] [address] [phone] (office)

## 2021-07-22 ENCOUNTER — Other Ambulatory Visit: Payer: Self-pay | Admitting: Family Medicine

## 2021-07-22 DIAGNOSIS — I10 Essential (primary) hypertension: Secondary | ICD-10-CM

## 2021-08-01 DIAGNOSIS — J301 Allergic rhinitis due to pollen: Secondary | ICD-10-CM | POA: Diagnosis not present

## 2021-08-01 DIAGNOSIS — J3081 Allergic rhinitis due to animal (cat) (dog) hair and dander: Secondary | ICD-10-CM | POA: Diagnosis not present

## 2021-08-01 DIAGNOSIS — J3089 Other allergic rhinitis: Secondary | ICD-10-CM | POA: Diagnosis not present

## 2021-08-13 ENCOUNTER — Ambulatory Visit
Admission: RE | Admit: 2021-08-13 | Discharge: 2021-08-13 | Disposition: A | Discharge status: Home / Self Care | Payer: Medicare HMO | Source: Ambulatory Visit | Attending: Family Medicine | Admitting: Family Medicine

## 2021-08-13 DIAGNOSIS — Z1231 Encounter for screening mammogram for malignant neoplasm of breast: Secondary | ICD-10-CM

## 2021-08-16 DIAGNOSIS — J3081 Allergic rhinitis due to animal (cat) (dog) hair and dander: Secondary | ICD-10-CM | POA: Diagnosis not present

## 2021-08-16 DIAGNOSIS — J3089 Other allergic rhinitis: Secondary | ICD-10-CM | POA: Diagnosis not present

## 2021-08-16 DIAGNOSIS — J301 Allergic rhinitis due to pollen: Secondary | ICD-10-CM | POA: Diagnosis not present

## 2021-08-22 ENCOUNTER — Ambulatory Visit (INDEPENDENT_AMBULATORY_CARE_PROVIDER_SITE_OTHER): Payer: Medicare HMO | Admitting: General Surgery

## 2021-08-22 ENCOUNTER — Encounter: Payer: Self-pay | Admitting: General Surgery

## 2021-08-22 ENCOUNTER — Other Ambulatory Visit: Payer: Self-pay

## 2021-08-22 VITALS — BP 161/84 | HR 75 | Temp 98.9°F | Resp 16 | Ht 69.0 in | Wt 243.0 lb

## 2021-08-22 DIAGNOSIS — D1720 Benign lipomatous neoplasm of skin and subcutaneous tissue of unspecified limb: Secondary | ICD-10-CM

## 2021-08-22 DIAGNOSIS — T8130XA Disruption of wound, unspecified, initial encounter: Secondary | ICD-10-CM

## 2021-08-22 DIAGNOSIS — M792 Neuralgia and neuritis, unspecified: Secondary | ICD-10-CM

## 2021-08-22 MED ORDER — GABAPENTIN 100 MG PO CAPS: 100.0000 mg | ORAL_CAPSULE | Freq: Three times a day (TID) | ORAL | 1 refills | Status: DC

## 2021-08-22 NOTE — Patient Instructions (Signed)
Spit suture could be causing the issue and extra inflammation. These will continue to dissolve and this will get better.  Ibuprofen can help with the inflammatory reaction from the sutures too.  Cover the areas, neosporin as needed Call if things draining or redness extending out Will do gabapentin 100 mg three times a day for a month and then see you back and taper you off.

## 2021-08-22 NOTE — Progress Notes (Signed)
[facility] Note   HPI:  71 y.o. Female presents to clinic for post-op follow-up evaluation of her lipoma excisions. She says that each of the incisions has had raised areas and inflamed areas. Her arm area even had some white liquid come out of it in the shower. She is also having this burning sensation in the left ankle and it comes and goes and is with certain shoes and positions.  Review of Systems:  Burning pain in incision Inflamed area with some drainage in incisions  All other review of systems: otherwise negative   Vital Signs:  BP (!) 161/84    Pulse 75    Temp 98.9 F (37.2 C) (Other (Comment))    Resp 16    Ht 5\' 9"  (1.753 m)    Wt 243 lb (110.2 kg)    SpO2 93%    BMI 35.88 kg/m    Physical Exam:  Physical Exam Vitals reviewed.  Cardiovascular:     Rate and Rhythm: Normal rate.  Musculoskeletal:     Comments: Left arm incision healing, inferior edge bruised with induration, healing scab, no erythema, left ankle incision with inferior edge scab and induration/ fibrosis under, tender, right ankle incision with inferior edge with some scabbing and induration   Neurological:     Mental Status: She is alert.      Assessment:  71 y.o. yo Female with some odd local reactions in each of the incision sites, looks like she could be spitting sutures/ suture granuloma to me and this goes a long with her dehiscence last time too, her body/ skin may not like the sutures and she spits then/ forms little local infections ie the draining from the area. I also think she is rubbing a nerve on the left ankle given the positional burning pain especially given the inflammation/ fibrosis likely from the spitting suture/ suture granuloma.   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 [place], [place] [address] [phone] (office)

## 2021-08-23 ENCOUNTER — Encounter: Payer: Medicare HMO | Admitting: General Surgery

## 2021-08-30 DIAGNOSIS — J301 Allergic rhinitis due to pollen: Secondary | ICD-10-CM | POA: Diagnosis not present

## 2021-08-30 DIAGNOSIS — J3081 Allergic rhinitis due to animal (cat) (dog) hair and dander: Secondary | ICD-10-CM | POA: Diagnosis not present

## 2021-08-30 DIAGNOSIS — J3089 Other allergic rhinitis: Secondary | ICD-10-CM | POA: Diagnosis not present

## 2021-09-09 DIAGNOSIS — J301 Allergic rhinitis due to pollen: Secondary | ICD-10-CM | POA: Diagnosis not present

## 2021-09-09 DIAGNOSIS — J3081 Allergic rhinitis due to animal (cat) (dog) hair and dander: Secondary | ICD-10-CM | POA: Diagnosis not present

## 2021-09-09 DIAGNOSIS — J3089 Other allergic rhinitis: Secondary | ICD-10-CM | POA: Diagnosis not present

## 2021-09-18 ENCOUNTER — Ambulatory Visit (INDEPENDENT_AMBULATORY_CARE_PROVIDER_SITE_OTHER): Payer: Medicare HMO | Admitting: Family Medicine

## 2021-09-18 ENCOUNTER — Encounter: Payer: Self-pay | Admitting: Dermatology

## 2021-09-18 ENCOUNTER — Telehealth: Payer: Self-pay | Admitting: Family Medicine

## 2021-09-18 ENCOUNTER — Other Ambulatory Visit: Payer: Self-pay | Admitting: Family Medicine

## 2021-09-18 ENCOUNTER — Ambulatory Visit: Payer: Medicare HMO | Admitting: Dermatology

## 2021-09-18 ENCOUNTER — Other Ambulatory Visit: Payer: Self-pay

## 2021-09-18 ENCOUNTER — Encounter: Payer: Self-pay | Admitting: Family Medicine

## 2021-09-18 VITALS — BP 143/86 | HR 81 | Temp 97.6°F | Ht 69.0 in | Wt 243.4 lb

## 2021-09-18 DIAGNOSIS — L820 Inflamed seborrheic keratosis: Secondary | ICD-10-CM | POA: Diagnosis not present

## 2021-09-18 DIAGNOSIS — E781 Pure hyperglyceridemia: Secondary | ICD-10-CM

## 2021-09-18 DIAGNOSIS — R350 Frequency of micturition: Secondary | ICD-10-CM

## 2021-09-18 DIAGNOSIS — R238 Other skin changes: Secondary | ICD-10-CM | POA: Diagnosis not present

## 2021-09-18 DIAGNOSIS — K219 Gastro-esophageal reflux disease without esophagitis: Secondary | ICD-10-CM

## 2021-09-18 DIAGNOSIS — L821 Other seborrheic keratosis: Secondary | ICD-10-CM | POA: Diagnosis not present

## 2021-09-18 DIAGNOSIS — R69 Illness, unspecified: Secondary | ICD-10-CM | POA: Diagnosis not present

## 2021-09-18 DIAGNOSIS — N3281 Overactive bladder: Secondary | ICD-10-CM

## 2021-09-18 DIAGNOSIS — R7303 Prediabetes: Secondary | ICD-10-CM | POA: Diagnosis not present

## 2021-09-18 DIAGNOSIS — Z1283 Encounter for screening for malignant neoplasm of skin: Secondary | ICD-10-CM

## 2021-09-18 DIAGNOSIS — F5101 Primary insomnia: Secondary | ICD-10-CM

## 2021-09-18 DIAGNOSIS — I10 Essential (primary) hypertension: Secondary | ICD-10-CM | POA: Diagnosis not present

## 2021-09-18 DIAGNOSIS — Z86018 Personal history of other benign neoplasm: Secondary | ICD-10-CM | POA: Diagnosis not present

## 2021-09-18 DIAGNOSIS — K5909 Other constipation: Secondary | ICD-10-CM

## 2021-09-18 LAB — BAYER DCA HB A1C WAIVED: HB A1C (BAYER DCA - WAIVED): 6.2 % — ABNORMAL HIGH (ref 4.8–5.6)

## 2021-09-18 MED ORDER — AMLODIPINE BESYLATE 5 MG PO TABS: 5.0000 mg | ORAL_TABLET | Freq: Every day | ORAL | 3 refills | Status: DC

## 2021-09-18 MED ORDER — TROSPIUM CHLORIDE 20 MG PO TABS: 20.0000 mg | ORAL_TABLET | Freq: Two times a day (BID) | ORAL | 3 refills | Status: DC

## 2021-09-18 MED ORDER — PANTOPRAZOLE SODIUM 40 MG PO TBEC: 40.0000 mg | DELAYED_RELEASE_TABLET | Freq: Two times a day (BID) | ORAL | 3 refills | Status: DC

## 2021-09-18 MED ORDER — ATORVASTATIN CALCIUM 40 MG PO TABS: 40.0000 mg | ORAL_TABLET | Freq: Every day | ORAL | 3 refills | Status: DC

## 2021-09-18 MED ORDER — VALSARTAN-HYDROCHLOROTHIAZIDE 320-25 MG PO TABS: 1.0000 | ORAL_TABLET | Freq: Every day | ORAL | 3 refills | Status: DC

## 2021-09-18 MED ORDER — FEXOFENADINE HCL 180 MG PO TABS: 180.0000 mg | ORAL_TABLET | Freq: Every day | ORAL | 3 refills | Status: AC

## 2021-09-18 MED ORDER — SUCRALFATE 1 G PO TABS: 1.0000 g | ORAL_TABLET | Freq: Three times a day (TID) | ORAL | 3 refills | Status: DC

## 2021-09-18 MED ORDER — TRAZODONE HCL 150 MG PO TABS: 150.0000 mg | ORAL_TABLET | Freq: Every evening | ORAL | 3 refills | Status: DC | PRN

## 2021-09-18 NOTE — Patient Instructions (Addendum)
[email or web address]

## 2021-09-18 NOTE — Progress Notes (Signed)
Subjective: CC:HTN, elevated BGs PCP: Janora Norlander, DO HBZ:JIRCVEL Kristina Mclaughlin is a 71 y.o. female presenting to clinic today for:  1. HTN w/ hypertriglyceridemia Patient reports compliance with Norvasc 5 mg daily, Lipitor 40 mg daily and valsartan-hydrochlorothiazide 320-25 mg daily.  No reports of chest pain, shortness of breath, edema or falls  2.  Elevated blood sugars Patient noted to have prediabetes with last A1c of 6.2.  Fasting blood sugar was over 130.  She has been actively working on weight loss to reduce blood sugar and blood cholesterol.  No reports of polydipsia, polyuria  3.  Chronic constipation Patient has been utilizing Metamucil for constipation but notes that this causes quite a bit of bloating so she is reluctant to continue using it.  She has used MiraLAX in the past but has not used it consistently nor at higher doses.  She would be interested in utilizing this if possible.  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 TO REPLACE METOPROLOL, Disp: 90 tablet, Rfl: 0   atorvastatin (LIPITOR) 40 MG tablet, TAKE 1 TABLET BY MOUTH EVERY DAY, Disp: 90 tablet, Rfl: 3   calcium carbonate (OSCAL) 1500 (600 Ca) MG TABS tablet, Take 600 mg of elemental calcium by mouth daily with breakfast., Disp: , Rfl:     diclofenac Sodium (VOLTAREN) 1 % GEL, Apply 2 g topically 4 (four) times daily. (Patient taking differently: Apply 2 g topically 4 (four) times daily as needed (knee pain).), Disp: 50 g, Rfl: 1   EPINEPHrine 0.3 mg/0.3 mL IJ SOAJ injection, Inject 0.3 mg into the muscle as needed for anaphylaxis., Disp: , Rfl:    fexofenadine (ALLEGRA) 180 MG tablet, Take 180 mg by mouth daily., Disp: , Rfl:    fluticasone (FLONASE) 50 MCG/ACT nasal spray, Place 2 sprays into both nostrils daily. (Patient taking differently: Place 2 sprays into both nostrils in the morning and at bedtime.), Disp: 48 g, Rfl: 3   gabapentin (NEURONTIN) 100 MG capsule, Take 1 capsule (100 mg total) by mouth 3 (three) times daily., Disp: 90 capsule, Rfl: 1   Multiple Vitamin (MULTI-VITAMIN PO), Take 1 tablet by mouth daily. , Disp: , Rfl:    ondansetron (ZOFRAN) 4 MG tablet, Take 1 tablet (4 mg total) by mouth every 8 (eight) hours as needed., Disp: 30 tablet, Rfl: 1   pantoprazole (PROTONIX) 40 MG tablet, Take 1 tablet (40 mg total) by mouth 2 (two) times daily before a meal., Disp: 180 tablet, Rfl: 0   Polyethylene Glycol 400 (BLINK TEARS OP), Place 1 drop into both eyes 2 (two) times daily as needed (dry eyes)., Disp: , Rfl:    sucralfate (CARAFATE) 1 g tablet, Take 1 g by mouth 4 (four) times daily -  before meals and at bedtime., Disp: ,  Rfl:    SUMAtriptan (IMITREX) 100 MG tablet, TAKE 1 TABLET AS NEED FOR MIGRAINE. MAY REPEAT IN 2 HOURS IF HEADACHE PERSISTS OR RECURS., Disp: 8 tablet, Rfl: 12   SYMBICORT 80-4.5 MCG/ACT inhaler, SMARTSIG:2 Puff(s) By Mouth Twice Daily, Disp: 3 each, Rfl: 3   traZODone (DESYREL) 150 MG tablet, TAKE 1 TABLET (150 MG TOTAL) BY MOUTH AT BEDTIME AS NEEDED FOR SLEEP., Disp: 90 tablet, Rfl: 3   trospium (SANCTURA) 20 MG tablet, TAKE 1 TABLET BY MOUTH TWICE A DAY, Disp: 180 tablet, Rfl: 3   valsartan-hydrochlorothiazide (DIOVAN-HCT) 320-25 MG tablet, TAKE 1 TABLET BY MOUTH EVERY DAY, Disp: 90 tablet,  Rfl: 1 Social History   Socioeconomic History   Marital status: Married    Spouse name: Not on file   Number of children: 2   Years of education: Not on file   Highest education level: Not on file  Occupational History   Occupation: Purchasing   Tobacco Use   Smoking status: Never   Smokeless tobacco: Never  Vaping Use   Vaping Use: Never used  Substance and Sexual Activity   Alcohol use: No    Alcohol/week: 0.0 standard drinks   Drug use: No   Sexual activity: Not on file  Other Topics Concern   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  She tries to stay active.   Social Determinants of Health   Financial Resource Strain: Not on file  Food Insecurity: Not on file  Transportation Needs: Not on file  Physical Activity: Not on file  Stress: Not on file  Social Connections: Not on file  Intimate Partner Violence: Not on file   Family History  Problem Relation Age of Onset   Bladder Cancer Mother    Allergies Mother    Heart disease Mother    Allergies Sister    Alzheimer's disease Father    Breast cancer Neg Hx     Objective: Office vital signs reviewed. BP (!) 143/86    Pulse 81    Temp 97.6 F (36.4 C)    Ht 5\' 9"  (1.753 m)    Wt 243 lb 6.4 oz (110.4 kg)    SpO2 94%    BMI 35.94 kg/m   Physical Examination:  General: Awake, alert, well nourished, No acute distress HEENT: sclera white, MMM Cardio: regular rate and rhythm, S1S2 heard, no murmurs appreciated Pulm: clear to auscultation bilaterally, no wheezes, rhonchi or rales; normal work of breathing on room air Extremities: warm, well perfused, No edema, cyanosis or clubbing; +2 pulses bilaterally MSK: Ambulating independently.  Assessment/ Plan: 71 y.o. female   Essential hypertension - Plan: amLODipine (NORVASC) 5 MG tablet, valsartan-hydrochlorothiazide (DIOVAN-HCT) 320-25 MG  tablet  Hypertriglyceridemia - Plan: atorvastatin (LIPITOR) 40 MG tablet  Pre-diabetes - Plan: Bayer DCA Hb A1c Waived  Gastroesophageal reflux disease without esophagitis - Plan: pantoprazole (PROTONIX) 40 MG tablet  Primary insomnia - Plan: traZODone (DESYREL) 150 MG tablet  Overactive bladder - Plan: trospium (SANCTURA) 20 MG tablet  Urinary frequency - Plan: trospium (SANCTURA) 20 MG tablet  Blood pressure controlled for age.  Refills have been sent  Lipid panel up-to-date and showed good control triglycerides.  Continue Lipitor  A1c collected today and shows persistent prediabetes but it does seem to be moving in the right direction.  Continue lifestyle modification  Chronic constipation is not well controlled  currently.  Advised use of MiraLAX 1 capful mixed in 8 ounces of fluid and drinking twice daily until she has soft stools.  We discussed if she starts developing diarrhea she is to back down on the MiraLAX.  May maintain with 17 g mixed in 8 ounces of fluid once daily when she starts having normalized bowel movements.  Alternatively, we discussed Trulance versus Amitiza versus Linzess though these would be much more expensive options, we can certainly pursue them if the OTC remedies are not effective  GERD, insomnia, overactive bladder were not discussed in detail today but refills were sent today to her mail order pharmacy  Orders Placed This Encounter  Procedures   Bayer DCA Hb A1c Waived   Meds ordered this encounter  Medications   amLODipine (NORVASC) 5 MG tablet    Sig: Take 1 tablet (5 mg total) by mouth daily. TO REPLACE METOPROLOL    Dispense:  90 tablet    Refill:  3   atorvastatin (LIPITOR) 40 MG tablet    Sig: Take 1 tablet (40 mg total) by mouth daily.    Dispense:  90 tablet    Refill:  3   fexofenadine (ALLEGRA) 180 MG tablet    Sig: Take 1 tablet (180 mg total) by mouth daily.    Dispense:  90 tablet    Refill:  3   pantoprazole (PROTONIX) 40 MG  tablet    Sig: Take 1 tablet (40 mg total) by mouth 2 (two) times daily before a meal.    Dispense:  180 tablet    Refill:  3   sucralfate (CARAFATE) 1 g tablet    Sig: Take 1 tablet (1 g total) by mouth 4 (four) times daily -  before meals and at bedtime.    Dispense:  360 tablet    Refill:  3   traZODone (DESYREL) 150 MG tablet    Sig: Take 1 tablet (150 mg total) by mouth at bedtime as needed for sleep.    Dispense:  90 tablet    Refill:  3   trospium (SANCTURA) 20 MG tablet    Sig: Take 1 tablet (20 mg total) by mouth 2 (two) times daily.    Dispense:  180 tablet    Refill:  3   valsartan-hydrochlorothiazide (DIOVAN-HCT) 320-25 MG tablet    Sig: Take 1 tablet by mouth daily.    Dispense:  90 tablet    Refill:  [facility] [place] [phone]

## 2021-09-19 ENCOUNTER — Ambulatory Visit: Payer: Medicare HMO | Admitting: General Surgery

## 2021-09-23 ENCOUNTER — Other Ambulatory Visit: Payer: Self-pay | Admitting: Family Medicine

## 2021-09-23 DIAGNOSIS — R058 Other specified cough: Secondary | ICD-10-CM

## 2021-09-24 ENCOUNTER — Other Ambulatory Visit: Payer: Self-pay | Admitting: Family Medicine

## 2021-09-24 ENCOUNTER — Other Ambulatory Visit: Payer: Self-pay

## 2021-09-24 DIAGNOSIS — J309 Allergic rhinitis, unspecified: Secondary | ICD-10-CM

## 2021-09-24 MED ORDER — FLUTICASONE PROPIONATE 50 MCG/ACT NA SUSP: 2.0000 | Freq: Every day | NASAL | 3 refills | Status: DC

## 2021-09-24 MED ORDER — SYMBICORT 80-4.5 MCG/ACT IN AERO: INHALATION_SPRAY | RESPIRATORY_TRACT | 1 refills | Status: DC

## 2021-09-24 MED ORDER — SUMATRIPTAN SUCCINATE 100 MG PO TABS: ORAL_TABLET | ORAL | 2 refills | Status: DC

## 2021-09-25 ENCOUNTER — Other Ambulatory Visit: Payer: Self-pay | Admitting: Family Medicine

## 2021-09-25 ENCOUNTER — Encounter: Payer: Medicare HMO | Admitting: General Surgery

## 2021-09-25 NOTE — Telephone Encounter (Signed)
I do not have symbicort listed as an allergy for pt but pharmacy does.  Can we please confirm with pt that she wants the symbicort?

## 2021-09-27 DIAGNOSIS — J3081 Allergic rhinitis due to animal (cat) (dog) hair and dander: Secondary | ICD-10-CM | POA: Diagnosis not present

## 2021-09-27 DIAGNOSIS — J301 Allergic rhinitis due to pollen: Secondary | ICD-10-CM | POA: Diagnosis not present

## 2021-09-27 DIAGNOSIS — J3089 Other allergic rhinitis: Secondary | ICD-10-CM | POA: Diagnosis not present

## 2021-10-01 ENCOUNTER — Encounter: Payer: Medicare HMO | Admitting: General Surgery

## 2021-10-01 DIAGNOSIS — J3089 Other allergic rhinitis: Secondary | ICD-10-CM | POA: Diagnosis not present

## 2021-10-01 DIAGNOSIS — J3081 Allergic rhinitis due to animal (cat) (dog) hair and dander: Secondary | ICD-10-CM | POA: Diagnosis not present

## 2021-10-01 DIAGNOSIS — J301 Allergic rhinitis due to pollen: Secondary | ICD-10-CM | POA: Diagnosis not present

## 2021-10-05 ENCOUNTER — Encounter: Payer: Self-pay | Admitting: Dermatology

## 2021-10-05 NOTE — Progress Notes (Signed)
° °  Follow-Up Visit   Subjective  Kristina Mclaughlin is a 71 y.o. female who presents for the following: Annual Exam (No concerns. Personal history of atypical moles. ).  General skin examination, history of atypical moles Location:  Duration:  Quality:  Associated Signs/Symptoms: Modifying Factors:  Severity:  Timing: Context:   Objective  Well appearing patient in no apparent distress; mood and affect are within normal limits. Fully body skin check: No atypical pigmented lesions or nonmelanoma skin cancer  Right [place] Lip Soft 4 mm partially compressible blue dermal papule, typical dermoscopy  Left Inframammary Fold, Right Inframammary Fold, Torso - Posterior (Back) Multiple tan textured flattopped papules  Left Clavicle nflamed 6 mm flattopped pink scale    A full examination was performed including scalp, head, eyes, ears, nose, lips, neck, chest, axillae, abdomen, back, buttocks, bilateral upper extremities, bilateral lower extremities, hands, feet, fingers, toes, fingernails, and toenails. All findings within normal limits unless otherwise noted below.  Areas beneath undergarments not fully examined   Assessment & Plan    Screening exam for skin cancer  Annual skin examination  Venous [place]  Intervention not necessary  Seborrheic keratosis (3) Torso - Posterior (Back); Left Inframammary Fold; Right Inframammary Fold  Leave if stable  Inflamed seborrheic keratosis Left Clavicle  Destruction of lesion - Left Clavicle Complexity: simple   Destruction method: cryotherapy   Informed consent: discussed and consent obtained   Lesion destroyed using liquid nitrogen: Yes   Cryotherapy cycles:  5 Outcome: patient tolerated procedure well with no complications        I, Lavonna Monarch, MD, have reviewed all documentation for this visit.  The documentation on 10/05/21 for the exam, diagnosis, procedures,  and orders are all accurate and complete.

## 2021-10-08 ENCOUNTER — Encounter: Payer: Self-pay | Admitting: General Surgery

## 2021-10-08 ENCOUNTER — Other Ambulatory Visit: Payer: Self-pay

## 2021-10-08 ENCOUNTER — Ambulatory Visit (INDEPENDENT_AMBULATORY_CARE_PROVIDER_SITE_OTHER): Payer: Medicare HMO | Admitting: General Surgery

## 2021-10-08 VITALS — BP 153/81 | HR 79 | Temp 98.6°F | Resp 16 | Ht 69.0 in | Wt 241.0 lb

## 2021-10-08 DIAGNOSIS — M792 Neuralgia and neuritis, unspecified: Secondary | ICD-10-CM

## 2021-10-08 DIAGNOSIS — D1720 Benign lipomatous neoplasm of skin and subcutaneous tissue of unspecified limb: Secondary | ICD-10-CM | POA: Diagnosis not present

## 2021-10-08 MED ORDER — GABAPENTIN 100 MG PO CAPS: 100.0000 mg | ORAL_CAPSULE | Freq: Three times a day (TID) | ORAL | 1 refills | Status: DC

## 2021-10-08 NOTE — Patient Instructions (Signed)
Hypersensitivity in the area should continue to improve with time. Continue gabapentin 100 mg TID. If the pain is resolved for 2 weeks you can start to taper off. Holding the lunch time dose for a few days, then holding the breakfast and lunch time dose for a few days, and then coming off the evening dose.  ? ? ?

## 2021-10-08 NOTE — Progress Notes (Signed)
[facility] ? ?Still with burning pain. Hypersensitivity in the area with wearing shoes. Improving. Doing gabapentin '100mg'$  TID.  ? ?BP (!) 153/81   Pulse 79   Temp 98.6 ?F (37 ?C) (Other (Comment))   Resp 16   Ht '5\' 9"'$  (1.753 m)   Wt 241 lb (109.3 kg)   SpO2 97%   BMI 35.59 kg/m?  ?Left ankle incision healed. No erythema or drainage. ? ?Patient s/p left ankle lipoma excision and dehiscence, suture spitting, neuropathic pain. This should continue to improve with time. ? ?Hypersensitivity in the area should continue to improve with time. Continue gabapentin 100 mg TID. If the pain is resolved for 2 weeks you can start to taper off. Holding the lunch time dose for a few days, then holding the breakfast and lunch time dose for a few days, and then coming off the evening dose.  ? ?Gabapentin '100mg'$  TID refilled  ? ?PRN follow up ? ?Curlene Labrum, MD ?[facility] ?[place], [place] [address] ?[phone] (office) ?  ?

## 2021-10-14 ENCOUNTER — Other Ambulatory Visit: Payer: Self-pay | Admitting: Gastroenterology

## 2021-10-14 DIAGNOSIS — R1011 Right upper quadrant pain: Secondary | ICD-10-CM

## 2021-10-17 DIAGNOSIS — J3081 Allergic rhinitis due to animal (cat) (dog) hair and dander: Secondary | ICD-10-CM | POA: Diagnosis not present

## 2021-10-17 DIAGNOSIS — J45991 Cough variant asthma: Secondary | ICD-10-CM | POA: Diagnosis not present

## 2021-10-17 DIAGNOSIS — J3089 Other allergic rhinitis: Secondary | ICD-10-CM | POA: Diagnosis not present

## 2021-10-17 DIAGNOSIS — J301 Allergic rhinitis due to pollen: Secondary | ICD-10-CM | POA: Diagnosis not present

## 2021-10-28 ENCOUNTER — Inpatient Hospital Stay: Admission: RE | Admit: 2021-10-28 | Payer: Medicare HMO | Source: Ambulatory Visit

## 2021-10-29 ENCOUNTER — Other Ambulatory Visit: Payer: Medicare HMO

## 2021-10-31 DIAGNOSIS — J3089 Other allergic rhinitis: Secondary | ICD-10-CM | POA: Diagnosis not present

## 2021-10-31 DIAGNOSIS — J301 Allergic rhinitis due to pollen: Secondary | ICD-10-CM | POA: Diagnosis not present

## 2021-10-31 DIAGNOSIS — J3081 Allergic rhinitis due to animal (cat) (dog) hair and dander: Secondary | ICD-10-CM | POA: Diagnosis not present

## 2021-11-04 ENCOUNTER — Telehealth: Payer: Self-pay | Admitting: Family Medicine

## 2021-11-04 NOTE — Telephone Encounter (Signed)
Pt called stating that she stopped using [facility] because [facility]. ? ?[facility] [place].  ?

## 2021-11-11 ENCOUNTER — Ambulatory Visit
Admission: RE | Admit: 2021-11-11 | Discharge: 2021-11-11 | Disposition: A | Discharge status: Home / Self Care | Payer: Medicare HMO | Source: Ambulatory Visit | Attending: Gastroenterology | Admitting: Gastroenterology

## 2021-11-11 ENCOUNTER — Other Ambulatory Visit: Payer: Self-pay | Admitting: Family Medicine

## 2021-11-11 DIAGNOSIS — K760 Fatty (change of) liver, not elsewhere classified: Secondary | ICD-10-CM | POA: Diagnosis not present

## 2021-11-11 DIAGNOSIS — J3081 Allergic rhinitis due to animal (cat) (dog) hair and dander: Secondary | ICD-10-CM | POA: Diagnosis not present

## 2021-11-11 DIAGNOSIS — R1011 Right upper quadrant pain: Secondary | ICD-10-CM

## 2021-11-11 DIAGNOSIS — I10 Essential (primary) hypertension: Secondary | ICD-10-CM

## 2021-11-11 DIAGNOSIS — J301 Allergic rhinitis due to pollen: Secondary | ICD-10-CM | POA: Diagnosis not present

## 2021-11-11 DIAGNOSIS — F5101 Primary insomnia: Secondary | ICD-10-CM

## 2021-11-11 DIAGNOSIS — J3089 Other allergic rhinitis: Secondary | ICD-10-CM | POA: Diagnosis not present

## 2021-11-28 DIAGNOSIS — J3081 Allergic rhinitis due to animal (cat) (dog) hair and dander: Secondary | ICD-10-CM | POA: Diagnosis not present

## 2021-11-28 DIAGNOSIS — J301 Allergic rhinitis due to pollen: Secondary | ICD-10-CM | POA: Diagnosis not present

## 2021-11-28 DIAGNOSIS — J3089 Other allergic rhinitis: Secondary | ICD-10-CM | POA: Diagnosis not present

## 2021-12-11 ENCOUNTER — Encounter: Payer: Self-pay | Admitting: Family Medicine

## 2021-12-11 ENCOUNTER — Other Ambulatory Visit: Payer: Self-pay | Admitting: Family Medicine

## 2021-12-11 ENCOUNTER — Ambulatory Visit (INDEPENDENT_AMBULATORY_CARE_PROVIDER_SITE_OTHER): Payer: Medicare HMO | Admitting: Family Medicine

## 2021-12-11 VITALS — BP 132/66 | HR 81 | Temp 99.2°F | Ht 69.0 in | Wt 238.0 lb

## 2021-12-11 DIAGNOSIS — J209 Acute bronchitis, unspecified: Secondary | ICD-10-CM | POA: Diagnosis not present

## 2021-12-11 DIAGNOSIS — I10 Essential (primary) hypertension: Secondary | ICD-10-CM

## 2021-12-11 DIAGNOSIS — M25562 Pain in left knee: Secondary | ICD-10-CM | POA: Diagnosis not present

## 2021-12-11 DIAGNOSIS — N3281 Overactive bladder: Secondary | ICD-10-CM

## 2021-12-11 DIAGNOSIS — J45909 Unspecified asthma, uncomplicated: Secondary | ICD-10-CM | POA: Diagnosis not present

## 2021-12-11 DIAGNOSIS — R6889 Other general symptoms and signs: Secondary | ICD-10-CM

## 2021-12-11 DIAGNOSIS — M25551 Pain in right hip: Secondary | ICD-10-CM | POA: Diagnosis not present

## 2021-12-11 DIAGNOSIS — R350 Frequency of micturition: Secondary | ICD-10-CM

## 2021-12-11 DIAGNOSIS — M7061 Trochanteric bursitis, right hip: Secondary | ICD-10-CM | POA: Diagnosis not present

## 2021-12-11 MED ORDER — AMOXICILLIN-POT CLAVULANATE 875-125 MG PO TABS: 1.0000 | ORAL_TABLET | Freq: Two times a day (BID) | ORAL | 0 refills | Status: AC

## 2021-12-11 MED ORDER — TUSSIN COUGH 15 MG/5ML PO SYRP: 10.0000 mL | ORAL_SOLUTION | Freq: Four times a day (QID) | ORAL | 0 refills | Status: DC | PRN

## 2021-12-11 MED ORDER — METHYLPREDNISOLONE ACETATE 80 MG/ML IJ SUSP
60.0000 mg | Freq: Once | INTRAMUSCULAR | Status: AC
  Administered 2021-12-11: 60 mg via INTRAMUSCULAR

## 2021-12-11 NOTE — Addendum Note (Signed)
Addended by: Geryl Rankins D on: 12/11/2021 03:02 PM ? ? Modules accepted: Orders ? ?

## 2021-12-11 NOTE — Progress Notes (Signed)
?  ? ?Subjective:  ?Patient ID: Kristina Mclaughlin, female    DOB: 1951-07-06, 71 y.o.   MRN: [id number] ? ?Patient Care Team: ?Janora Norlander, DO as PCP - General (Family Medicine) ?Lavonna Monarch, MD as Consulting Physician (Dermatology)  ? ?Chief Complaint:  Cough (Congestion/X 4-5 days) ? ? ?HPI: ?Kristina Mclaughlin is a 71 y.o. female presenting on 12/11/2021 for Cough (Congestion/X 4-5 days) ? ? ?Cough ?This is a new problem. The current episode started 1 to 4 weeks ago. The problem has been gradually worsening. The problem occurs every few minutes. The cough is Productive of sputum. Associated symptoms include chills, a fever, nasal congestion and wheezing. Pertinent negatives include no chest pain, ear congestion, ear pain, headaches, heartburn, hemoptysis, myalgias, postnasal drip, rash, rhinorrhea, sore throat, shortness of breath, sweats or weight loss. She has tried a beta-agonist inhaler and OTC cough suppressant for the symptoms. The treatment provided no relief. Her past medical history is significant for asthma.  ? ?Relevant past medical, surgical, family, and social history reviewed and updated as indicated.  ?Allergies and medications reviewed and updated. Data reviewed: Chart in Epic. ? ? ?Past Medical History:  ?Diagnosis Date  ? Asthma   ? 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 moderate atypia - left lower, lateral back  ? Essential hypertension 10/29/2015  ? Hypertension   ? Insomnia   ? ? ?Past Surgical History:  ?Procedure Laterality Date  ? ABDOMINAL HYSTERECTOMY    ? BACK SURGERY    ? BREAST SURGERY    ? biopsy x2; reduction  ? EXAMINATION UNDER ANESTHESIA  08/27/2012  ? LIPOMA EXCISION Right 07/03/2021  ? Procedure: EXCISION LIPOMA; ANKLE;  Surgeon: Virl Cagey, MD;  Location: [facility];  Service: General;  Laterality: Right;  ? LIPOMA EXCISION N/A 07/03/2021  ? Procedure: EXCISION LIPOMA; SUPRAPUBIC;  Surgeon: Virl Cagey, MD;  Location: [facility];  Service: General;   Laterality: N/A;  ? MASS EXCISION Left 07/03/2021  ? Procedure: EXCISION LIPOMA; ANKLE;  Surgeon: Virl Cagey, MD;  Location: [facility];  Service: General;  Laterality: Left;  ? MASS EXCISION Left 07/03/2021  ? Procedure: EXCISION OF LIPOMA; ARM;  Surgeon: Virl Cagey, MD;  Location: [facility];  Service: General;  Laterality: Left;  ? REDUCTION MAMMAPLASTY Bilateral 2002  ? ? ?Social History  ? ?Socioeconomic History  ? Marital status: Married  ?  Spouse name: Not on file  ? Number of children: 2  ? Years of education: Not on file  ? Highest education level: Not on file  ?Occupational History  ? Occupation: Purchasing   ?Tobacco Use  ? Smoking status: Never  ? Smokeless tobacco: Never  ?Vaping Use  ? Vaping Use: Never used  ?Substance and Sexual Activity  ? Alcohol use: No  ?  Alcohol/week: 0.0 standard drinks  ? Drug use: No  ? Sexual activity: Not on file  ?Other Topics Concern  ? Not on file  ?Social History Narrative  ? 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  She tries to stay active.  ? ?Social Determinants of Health  ? ?Financial Resource Strain: Not on file  ?Food Insecurity: Not on file  ?Transportation Needs: Not on file  ?Physical Activity: Not on file  ?Stress: Not on file  ?Social Connections: Not on file  ?Intimate Partner Violence: Not on file  ? ? ?Outpatient Encounter Medications as of 12/11/2021  ?  Medication Sig  ? albuterol (VENTOLIN HFA) 108 (90 Base) MCG/ACT inhaler TAKE 2 PUFFS BY MOUTH EVERY 6 HOURS AS NEEDED FOR WHEEZE OR SHORTNESS OF BREATH  ? amLODipine (NORVASC) 5 MG tablet Take 1 tablet (5 mg total) by mouth daily. TO REPLACE METOPROLOL  ? amoxicillin-clavulanate (AUGMENTIN) 875-125 MG tablet Take 1 tablet by mouth 2 (two) times daily for 10 days.  ? atorvastatin (LIPITOR) 40 MG tablet Take 1 tablet (40 mg total) by mouth daily.  ? calcium carbonate (OSCAL) 1500 (600 Ca) MG TABS tablet Take 600 mg  of elemental calcium by mouth daily with breakfast.  ? dextromethorphan (TUSSIN COUGH) 15 MG/5ML syrup Take 10 mLs (30 mg total) by mouth 4 (four) times daily as needed for cough.  ? diclofenac Sodium (VOLTAREN) 1 % GEL Apply 2 g topically 4 (four) times daily. (Patient taking differently: Apply 2 g topically 4 (four) times daily as needed (knee pain).)  ? EPINEPHrine 0.3 mg/0.3 mL IJ SOAJ injection Inject 0.3 mg into the muscle as needed for anaphylaxis.  ? fexofenadine (ALLEGRA) 180 MG tablet Take 1 tablet (180 mg total) by mouth daily.  ? fluticasone (FLONASE) 50 MCG/ACT nasal spray Place 2 sprays into both nostrils daily.  ? Multiple Vitamin (MULTI-VITAMIN PO) Take 1 tablet by mouth daily.   ? ondansetron (ZOFRAN) 4 MG tablet Take 1 tablet (4 mg total) by mouth every 8 (eight) hours as needed.  ? pantoprazole (PROTONIX) 40 MG tablet Take 1 tablet (40 mg total) by mouth 2 (two) times daily before a meal.  ? Polyethylene Glycol 400 (BLINK TEARS OP) Place 1 drop into both eyes 2 (two) times daily as needed (dry eyes).  ? sucralfate (CARAFATE) 1 g tablet Take 1 tablet (1 g total) by mouth 4 (four) times daily -  before meals and at bedtime.  ? SUMAtriptan (IMITREX) 100 MG tablet TAKE 1 TABLET AS NEED FOR MIGRAINE. MAY REPEAT IN 2 HOURS IF HEADACHE PERSISTS OR RECURS.  ? SYMBICORT 80-4.5 MCG/ACT inhaler USE 2 INHALATIONS ORALLY   TWICE DAILY.  Patient has been using. Please fill.  ? traZODone (DESYREL) 150 MG tablet Take 1 tablet (150 mg total) by mouth at bedtime as needed for sleep.  ? trospium (SANCTURA) 20 MG tablet Take 1 tablet (20 mg total) by mouth 2 (two) times daily.  ? valsartan-hydrochlorothiazide (DIOVAN-HCT) 320-25 MG tablet Take 1 tablet by mouth daily.  ? gabapentin (NEURONTIN) 100 MG capsule Take 1 capsule (100 mg total) by mouth 3 (three) times daily.  ? ?No facility-administered encounter medications on file as of 12/11/2021.  ? ? ?Allergies  ?Allergen Reactions  ? Lovenox [Enoxaparin Sodium]  Anaphylaxis  ? Moxifloxacin Anaphylaxis and Other (See Comments)  ? Quinolones Hives and Shortness Of Breath  ? Mucinex Sinus-Max [Phenylephrine-Apap-Guaifenesin] Swelling  ? Sulfamethoxazole-Trimethoprim Other (See Comments)  ? ? ?Review of Systems  ?Constitutional:  Positive for activity change, chills and fever. Negative for appetite change, diaphoresis, fatigue, unexpected weight change and weight loss.  ?HENT:  Positive for congestion. Negative for dental problem, drooling, ear discharge, ear pain, facial swelling, hearing loss, mouth sores, nosebleeds, postnasal drip, rhinorrhea, sinus pressure, sinus pain, sneezing, sore throat, tinnitus, trouble swallowing and voice change.   ?Respiratory:  Positive for cough and wheezing. Negative for apnea, hemoptysis, choking, chest tightness, shortness of breath and stridor.   ?Cardiovascular:  Negative for chest pain, palpitations and leg swelling.  ?Gastrointestinal:  Negative for abdominal pain and heartburn.  ?Genitourinary:  Negative for decreased urine volume  and difficulty urinating.  ?Musculoskeletal:  Negative for myalgias.  ?Skin:  Negative for rash.  ?Neurological:  Negative for weakness and headaches.  ?Psychiatric/Behavioral:  Negative for confusion.   ?All other systems reviewed and are negative. ? ?   ? ?Objective:  ?BP 132/66   Pulse 81   Temp 99.2 ?F (37.3 ?C)   Ht '5\' 9"'$  (1.753 m)   Wt 238 lb (108 kg)   SpO2 94%   BMI 35.15 kg/m?   ? ?Wt Readings from Last 3 Encounters:  ?12/11/21 238 lb (108 kg)  ?10/08/21 241 lb (109.3 kg)  ?09/18/21 243 lb 6.4 oz (110.4 kg)  ? ? ?Physical Exam ?Vitals and nursing note reviewed.  ?Constitutional:   ?   General: She is not in acute distress. ?   Appearance: Normal appearance. She is obese. She is not ill-appearing, toxic-appearing or diaphoretic.  ?HENT:  ?   Head: Normocephalic and atraumatic.  ?   Right Ear: Tympanic membrane, ear canal and external ear normal.  ?   Left Ear: Tympanic membrane, ear canal and  external ear normal.  ?   Nose: Nose normal.  ?   Mouth/Throat:  ?   Mouth: Mucous membranes are moist.  ?   Pharynx: Oropharynx is clear.  ?Eyes:  ?   Conjunctiva/sclera: Conjunctivae normal.  ?   Pupils: Pupils are equal,

## 2021-12-12 LAB — COVID-19, FLU A+B AND RSV
Influenza A, NAA: NOT DETECTED
Influenza B, NAA: NOT DETECTED
RSV, NAA: NOT DETECTED
SARS-CoV-2, NAA: NOT DETECTED

## 2021-12-13 ENCOUNTER — Telehealth: Payer: Self-pay | Admitting: Family Medicine

## 2021-12-13 NOTE — Telephone Encounter (Signed)
Lm making pt aware

## 2021-12-20 DIAGNOSIS — J3089 Other allergic rhinitis: Secondary | ICD-10-CM | POA: Diagnosis not present

## 2021-12-20 DIAGNOSIS — J3081 Allergic rhinitis due to animal (cat) (dog) hair and dander: Secondary | ICD-10-CM | POA: Diagnosis not present

## 2021-12-20 DIAGNOSIS — J301 Allergic rhinitis due to pollen: Secondary | ICD-10-CM | POA: Diagnosis not present

## 2021-12-25 ENCOUNTER — Ambulatory Visit: Payer: Medicare HMO | Attending: Physician Assistant

## 2021-12-25 DIAGNOSIS — M25551 Pain in right hip: Secondary | ICD-10-CM | POA: Insufficient documentation

## 2021-12-25 DIAGNOSIS — M6281 Muscle weakness (generalized): Secondary | ICD-10-CM | POA: Diagnosis not present

## 2021-12-25 NOTE — Therapy (Signed)
[facility] [place], [place], [address] Phone: [phone]   Fax:  [phone]  Physical Therapy Evaluation  Patient Details  Name: Kristina Mclaughlin MRN: [id number] Date of Birth: 17-Jul-1951 Referring Provider (PT): [place], [place]   Encounter Date: 12/25/2021   PT End of Session - 12/25/21 1117     Visit Number 1    Number of Visits 5    Date for PT Re-Evaluation 02/28/22    PT Start Time 1116    PT Stop Time 1150    PT Time Calculation (min) 34 min    Activity Tolerance Patient tolerated treatment well    Behavior During Therapy [facility] for tasks assessed/performed             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, lateral back   Essential hypertension 10/29/2015   Hypertension    Insomnia     Past Surgical History:  Procedure Laterality Date   ABDOMINAL HYSTERECTOMY     BACK SURGERY     BREAST SURGERY     biopsy x2; reduction   EXAMINATION UNDER ANESTHESIA  08/27/2012   LIPOMA EXCISION Right 07/03/2021   Procedure: EXCISION LIPOMA; ANKLE;  Surgeon: Virl Cagey, MD;  Location: [facility];  Service: General;  Laterality: Right;   LIPOMA EXCISION N/A 07/03/2021   Procedure: EXCISION LIPOMA; SUPRAPUBIC;  Surgeon: Virl Cagey, MD;  Location: [facility];  Service: General;  Laterality: N/A;   MASS EXCISION Left 07/03/2021   Procedure: EXCISION LIPOMA; ANKLE;  Surgeon: Virl Cagey, MD;  Location: [facility];  Service: General;  Laterality: Left;   MASS EXCISION Left 07/03/2021   Procedure: EXCISION OF LIPOMA; ARM;  Surgeon: Virl Cagey, MD;  Location: [facility];  Service: General;  Laterality: Left;   REDUCTION MAMMAPLASTY Bilateral 2002    There were no vitals filed for this visit.    Subjective Assessment - 12/25/21 1117     Subjective Patient reports that she had been experiencing right hip pain for months and was  diagnosed with bursitis in her right hip. However, she has not had any pain since she had an injection in her hip about 3 weeks ago. She notes that her hip will be sore first thing in the morning, but otherwise nothing aggravates her hip. She had never had anything like this happen before. She has gone back to the gym and this has caused her left knee to be sore, but her right hip feels ok.    Pertinent History history of left knee fracture, history of lumbar surgery    How long can you walk comfortably? about 2 hours    Patient Stated Goals be able to do her normal activities without her hip hurting    Currently in Pain? No/denies    Pain Score 3     Pain Location Hip    Pain Orientation Right    Pain Descriptors / Indicators Sore    Pain Type Chronic pain    Pain Onset More than a month ago    Pain Frequency Rarely                [facility] PT Assessment - 12/25/21 0001       Assessment   Medical Diagnosis Pain in right hip joint    Referring Provider (PT) Chabon, PA-C    Onset Date/Surgical Date --   months ago  Next MD Visit as needed    Prior Therapy No      Precautions   Precautions None      Restrictions   Weight Bearing Restrictions No      Balance Screen   Has the patient fallen in the past 6 months Yes    How many times? 1   slipped on a wet floor   Has the patient had a decrease in activity level because of a fear of falling?  No    Is the patient reluctant to leave their home because of a fear of falling?  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      Cognition   Overall Cognitive Status Within Functional Limits for tasks  assessed    Attention Focused    Focused Attention Appears intact    Memory Appears intact    Awareness Appears intact    Problem Solving Appears intact      Sensation   Additional Comments Patient reports no numbness or tingling      ROM / Strength   AROM / PROM / Strength Strength      Strength   Strength Assessment Site Hip;Knee;Ankle    Right/Left Hip Right;Left    Right Hip Flexion 4-/5   familiar hip pain   Right Hip ABduction 4/5    Left Hip Flexion 3+/5    Right/Left Knee Right;Left    Right Knee Flexion 4/5    Right Knee Extension 4/5    Left Knee Flexion 4+/5    Left Knee Extension 4/5    Right/Left Ankle Right;Left    Right Ankle Dorsiflexion 4+/5    Left Ankle Dorsiflexion 4+/5      Palpation   Palpation comment TTP: right IT band (reproduced familiar hip pain)                        Objective measurements completed on examination: See above findings.       [facility] Adult PT Treatment/Exercise - 12/25/21 0001       Exercises   Exercises Knee/Hip      Knee/Hip Exercises: Standing   Hip Abduction Both;15 reps;Knee straight   green t-band at ankles     Knee/Hip Exercises: Seated   Clamshell with TheraBand Red   20 reps     Manual Therapy   Manual Therapy Soft tissue mobilization    Soft tissue mobilization to right IT band                          PT Long Term Goals - 12/25/21 1604       PT LONG TERM GOAL #1   Title Patient will be independent with her HEP    Time 5    Period Weeks    Status New    Target Date 01/29/22      PT LONG TERM GOAL #2   Title Patient will be able to complete her daily activities with right hip pain at most a 1/10.  Time 5    Period Weeks    Target Date 01/29/22      PT LONG TERM GOAL #3   Title Patient will improve her right hip flexion strength to at least a 4/5 without being limited by her familiar right hip pain    Time 5    Period Weeks    Status New    Target Date  01/29/22                    Plan - 12/25/21 1551     Clinical Impression Statement Patient is a 71 year old female presenting to physical therapy with right hip pain. She exhibited low pain severity and irritabiliity since she had an injection in her right hip. However, palpation to her right IT band significantly reproduced her familiar symptoms. She exhibited reduced lower extremity strength. Her hip AROM was unable to be assessed due to pain and discomfort with laying in supine from a previous lumbar surgery. Recommend that she continue with skilled physical therapy to address her remaining impairments to return to her prior level of function.    Personal Factors and Comorbidities Comorbidity 2    Comorbidities HTN, OA    Examination-Activity Limitations Sleep    Examination-Participation Restrictions Other    Stability/Clinical Decision Making Stable/Uncomplicated    Clinical Decision Making Low    Rehab Potential Good    PT Frequency 1x / week    PT Duration Other (comment)   5 weeks   PT Treatment/Interventions Cryotherapy;Electrical Stimulation;Moist Heat;Neuromuscular re-education;Therapeutic exercise;Therapeutic activities;Patient/family education;Manual techniques;Dry needling;Passive range of motion;Taping    PT Next Visit Plan recumbent bike, hip and lower extremity strengthening    Consulted and Agree with Plan of Care Patient             Patient will benefit from skilled therapeutic intervention in order to improve the following deficits and impairments:  Decreased strength, Pain  Visit Diagnosis: Muscle weakness (generalized)  Pain in right hip     Problem List Patient Active Problem List   Diagnosis Date Noted   Chronic constipation 09/18/2021   Neuropathic pain 08/22/2021   Lipoma    Lipoma of foot 06/11/2021   Lipoma of upper arm 06/11/2021   Acute pain of left knee 05/27/2021   Other chest pain 05/27/2021   Spitting suture 04/03/2020    Lipoma of abdominal wall 12/15/2019   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

## 2022-01-02 ENCOUNTER — Encounter: Payer: Medicare HMO | Admitting: Physical Therapy

## 2022-01-02 DIAGNOSIS — J3089 Other allergic rhinitis: Secondary | ICD-10-CM | POA: Diagnosis not present

## 2022-01-02 DIAGNOSIS — J301 Allergic rhinitis due to pollen: Secondary | ICD-10-CM | POA: Diagnosis not present

## 2022-01-02 DIAGNOSIS — J3081 Allergic rhinitis due to animal (cat) (dog) hair and dander: Secondary | ICD-10-CM | POA: Diagnosis not present

## 2022-01-06 ENCOUNTER — Encounter: Payer: Self-pay | Admitting: Physical Therapy

## 2022-01-06 ENCOUNTER — Ambulatory Visit: Payer: Medicare HMO | Attending: Physician Assistant | Admitting: Physical Therapy

## 2022-01-06 DIAGNOSIS — M25551 Pain in right hip: Secondary | ICD-10-CM | POA: Diagnosis not present

## 2022-01-06 DIAGNOSIS — M6281 Muscle weakness (generalized): Secondary | ICD-10-CM

## 2022-01-06 NOTE — Therapy (Addendum)
[facility] [place], [place], [address] Phone: [phone]   Fax:  [phone]  Physical Therapy Treatment  Patient Details  Name: Kristina Mclaughlin MRN: [id number] Date of Birth: 1950/12/06 Referring Provider (PT): [place], [place]   Encounter Date: 01/06/2022   PT End of Session - 01/06/22 1035     Visit Number 2    Number of Visits 5    Date for PT Re-Evaluation 02/28/22    PT Start Time 1033    PT Stop Time 1117    PT Time Calculation (min) 44 min    Activity Tolerance Patient tolerated treatment well    Behavior During Therapy [facility] for tasks assessed/performed             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, lateral back   Essential hypertension 10/29/2015   Hypertension    Insomnia     Past Surgical History:  Procedure Laterality Date   ABDOMINAL HYSTERECTOMY     BACK SURGERY     BREAST SURGERY     biopsy x2; reduction   EXAMINATION UNDER ANESTHESIA  08/27/2012   LIPOMA EXCISION Right 07/03/2021   Procedure: EXCISION LIPOMA; ANKLE;  Surgeon: Virl Cagey, MD;  Location: [facility];  Service: General;  Laterality: Right;   LIPOMA EXCISION N/A 07/03/2021   Procedure: EXCISION LIPOMA; SUPRAPUBIC;  Surgeon: Virl Cagey, MD;  Location: [facility];  Service: General;  Laterality: N/A;   MASS EXCISION Left 07/03/2021   Procedure: EXCISION LIPOMA; ANKLE;  Surgeon: Virl Cagey, MD;  Location: [facility];  Service: General;  Laterality: Left;   MASS EXCISION Left 07/03/2021   Procedure: EXCISION OF LIPOMA; ARM;  Surgeon: Virl Cagey, MD;  Location: [facility];  Service: General;  Laterality: Left;   REDUCTION MAMMAPLASTY Bilateral 2002    There were no vitals filed for this visit.   Subjective Assessment - 01/06/22 1034     Subjective Reports no pain currently upon arrival. Has greatest pain with sleeping on R side.     Pertinent History history of left knee fracture, history of lumbar surgery    How long can you walk comfortably? about 2 hours    Patient Stated Goals be able to do her normal activities without her hip hurting    Currently in Pain? No/denies                [facility] PT Assessment - 01/06/22 0001       Assessment   Medical Diagnosis Pain in right hip joint    Referring Provider (PT) Margit Banda, PA-C    Next MD Visit as needed    Prior Therapy No      Precautions   Precautions None      Restrictions   Weight Bearing Restrictions No                           [facility] Adult PT Treatment/Exercise - 01/06/22 0001       Knee/Hip Exercises: Stretches   Active Hamstring Stretch Right;3 reps;30 seconds    Other Knee/Hip Stretches R figure 4 stretch 3x30 sec      Knee/Hip Exercises: Aerobic   Nustep L3 x12 min      Knee/Hip Exercises: Standing   Hip Abduction AROM;Right;20 reps;Knee straight    Hip Extension AROM;Right;20 reps;Knee bent  Knee/Hip Exercises: Seated   Sit to Sand 15 reps;without UE support   green theraband     Knee/Hip Exercises: Supine   Bridges with Clamshell Strengthening;Both;20 reps;Limitations   green theraband     Knee/Hip Exercises: Sidelying   Clams R hip green theraband x20 reps      Manual Therapy   Manual Therapy Soft tissue mobilization    Soft tissue mobilization STW to R TFL, ITB                          PT Long Term Goals - 12/25/21 1604       PT LONG TERM GOAL #1   Title Patient will be independent with her HEP    Time 5    Period Weeks    Status New    Target Date 01/29/22      PT LONG TERM GOAL #2   Title Patient will be able to complete her daily activities with right hip pain at most a 1/10.    Time 5    Period Weeks    Target Date 01/29/22      PT LONG TERM GOAL #3   Title Patient will improve her right hip flexion strength to at least a 4/5 without being limited by her familiar right hip  pain    Time 5    Period Weeks    Status New    Target Date 01/29/22                   Plan - 01/06/22 1127     Clinical Impression Statement Patient presented in clinic with reports of no current pain. Patient has membership to a local gym. Patient able to tolerate all stretching and strengthening exercises with only complaints of L hip irritation. Patient is still tender over the R greater trochanter and more posterior portion of ITB. No modalities due to no pain of L hip. Patient instructed to monitor symptoms.    Personal Factors and Comorbidities Comorbidity 2    Comorbidities HTN, OA    Examination-Activity Limitations Sleep    Examination-Participation Restrictions Other    Stability/Clinical Decision Making Stable/Uncomplicated    Rehab Potential Good    PT Frequency 1x / week    PT Duration Other (comment)    PT Treatment/Interventions Cryotherapy;Electrical Stimulation;Moist Heat;Neuromuscular re-education;Therapeutic exercise;Therapeutic activities;Patient/family education;Manual techniques;Dry needling;Passive range of motion;Taping    PT Next Visit Plan recumbent bike, hip and lower extremity strengthening    Consulted and Agree with Plan of Care Patient             Patient will benefit from skilled therapeutic intervention in order to improve the following deficits and impairments:  Decreased strength, Pain  Visit Diagnosis: Muscle weakness (generalized)  Pain in right hip     Problem List Patient Active Problem List   Diagnosis Date Noted   Chronic constipation 09/18/2021   Neuropathic pain 08/22/2021   Lipoma    Lipoma of foot 06/11/2021   Lipoma of upper arm 06/11/2021   Acute pain of left knee 05/27/2021   Other chest pain 05/27/2021   Spitting suture 04/03/2020   Lipoma of abdominal wall 12/15/2019   Breast lipoma 12/15/2019   Lipoma of both lower extremities 12/15/2019   Primary insomnia 05/11/2018   Unilateral primary osteoarthritis,  left knee 05/11/2018   Hemorrhoids 01/22/2018   Allergic rhinitis 10/29/2015   Essential hypertension 10/29/2015   Upper airway cough syndrome 10/20/2014  Rationale for Evaluation and [place], [place] 01/06/2022, 11:30 AM  [facility] [place], [place], [address] Phone: [phone]   Fax:  [phone]  Name: Kristina Mclaughlin MRN: [id number] Date of Birth: 1951-02-28

## 2022-01-13 ENCOUNTER — Telehealth: Payer: Self-pay | Admitting: Family Medicine

## 2022-01-13 MED ORDER — SUCRALFATE 1 G PO TABS: 1.0000 g | ORAL_TABLET | Freq: Three times a day (TID) | ORAL | 2 refills | Status: DC

## 2022-01-13 NOTE — Telephone Encounter (Signed)
  Prescription Request  01/13/2022  Is this a "Controlled Substance" medicine? no  Have you seen your PCP in the last 2 weeks? no  If YES, route message to pool  -  If NO, patient needs to be scheduled for appointment.  What is the name of the medication or equipment? Sucralfate 1 mg #90  Have you contacted your pharmacy to request a refill? yes   Which pharmacy would you like this sent to? [facility] in [place]   Patient notified that their request is being sent to the clinical staff for review and that they should receive a response within 2 business days.

## 2022-01-13 NOTE — Telephone Encounter (Signed)
Pt wanting to go back to local pharmacy since using mail order was not cheaper, sent in rest of the years worth of RF that Dr. Lajuana Ripple had originally sent to mail order

## 2022-01-15 DIAGNOSIS — J3081 Allergic rhinitis due to animal (cat) (dog) hair and dander: Secondary | ICD-10-CM | POA: Diagnosis not present

## 2022-01-15 DIAGNOSIS — J3089 Other allergic rhinitis: Secondary | ICD-10-CM | POA: Diagnosis not present

## 2022-01-15 DIAGNOSIS — J301 Allergic rhinitis due to pollen: Secondary | ICD-10-CM | POA: Diagnosis not present

## 2022-01-16 ENCOUNTER — Ambulatory Visit: Payer: Medicare HMO | Admitting: Physical Therapy

## 2022-01-16 ENCOUNTER — Encounter: Payer: Self-pay | Admitting: Physical Therapy

## 2022-01-16 DIAGNOSIS — M6281 Muscle weakness (generalized): Secondary | ICD-10-CM | POA: Diagnosis not present

## 2022-01-16 DIAGNOSIS — M25551 Pain in right hip: Secondary | ICD-10-CM | POA: Diagnosis not present

## 2022-01-16 NOTE — Therapy (Addendum)
[facility] [place], [place], [address] Phone: [phone]   Fax:  [phone]  Physical Therapy Treatment  Patient Details  Name: Kristina Mclaughlin MRN: [id number] Date of Birth: 20-Aug-1950 Referring Provider (PT): [place], [place]   Encounter Date: 01/16/2022   PT End of Session - 01/16/22 1033     Visit Number 3    Number of Visits 5    Date for PT Re-Evaluation 02/28/22    PT Start Time 1031    PT Stop Time 1117    PT Time Calculation (min) 46 min    Activity Tolerance Patient tolerated treatment well    Behavior During Therapy [facility] for tasks assessed/performed             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, lateral back   Essential hypertension 10/29/2015   Hypertension    Insomnia     Past Surgical History:  Procedure Laterality Date   ABDOMINAL HYSTERECTOMY     BACK SURGERY     BREAST SURGERY     biopsy x2; reduction   EXAMINATION UNDER ANESTHESIA  08/27/2012   LIPOMA EXCISION Right 07/03/2021   Procedure: EXCISION LIPOMA; ANKLE;  Surgeon: Virl Cagey, MD;  [facility];  Service: General;  Laterality: Right;   LIPOMA EXCISION N/A 07/03/2021   Procedure: EXCISION LIPOMA; SUPRAPUBIC;  Surgeon: Virl Cagey, MD;  Location: [facility];  Service: General;  Laterality: N/A;   MASS EXCISION Left 07/03/2021   Procedure: EXCISION LIPOMA; ANKLE;  Surgeon: Virl Cagey, MD;  Location: [facility];  Service: General;  Laterality: Left;   MASS EXCISION Left 07/03/2021   Procedure: EXCISION OF LIPOMA; ARM;  Surgeon: Virl Cagey, MD;  Location: [facility];  Service: General;  Laterality: Left;   REDUCTION MAMMAPLASTY Bilateral 2002    There were no vitals filed for this visit.   Subjective Assessment - 01/16/22 1033     Subjective Reports no pain currently upon arrival. Sleeping better on R side.    Pertinent  History history of left knee fracture, history of lumbar surgery    How long can you walk comfortably? about 2 hours    Patient Stated Goals be able to do her normal activities without her hip hurting    Currently in Pain? No/denies                [facility] PT Assessment - 01/16/22 0001       Assessment   Medical Diagnosis Pain in right hip joint    Referring Provider (PT) Margit Banda, PA-C    Next MD Visit as needed    Prior Therapy No      Precautions   Precautions None      ROM / Strength   AROM / PROM / Strength Strength      Strength   Overall Strength Within functional limits for tasks performed    Strength Assessment Site Hip    Right Hip Flexion 4/5                           [facility] Adult PT Treatment/Exercise - 01/16/22 0001       Knee/Hip Exercises: Aerobic   Nustep L3 x18 min      Knee/Hip Exercises: Machines for Strengthening   Cybex Knee Extension 40# 3x10 reps    Cybex Knee Flexion  20# 3x10 reps    Cybex Leg Press 3 pl x20 reps with red theraband fpr clam      Knee/Hip Exercises: Standing   Hip Abduction Stengthening;Both;20 reps;Knee straight;Limitations    Abduction Limitations red theraband    Hip Extension Stengthening;Both;20 reps;Knee straight;Limitations    Extension Limitations red theraband    Functional Squat 20 reps      Knee/Hip Exercises: Supine   Bridges with Clamshell Strengthening;20 reps   red theraband                         PT Long Term Goals - 01/16/22 1120       PT LONG TERM GOAL #1   Title Patient will be independent with her HEP    Time 5    Period Weeks    Status Achieved    Target Date 01/29/22      PT LONG TERM GOAL #2   Title Patient will be able to complete her daily activities with right hip pain at most a 1/10.    Time 5    Period Weeks    Status Achieved    Target Date 01/29/22      PT LONG TERM GOAL #3   Title Patient will improve her right hip flexion strength to at least a  4/5 without being limited by her familiar right hip pain    Time 5    Period Weeks    Status Achieved    Target Date 01/29/22                   Plan - 01/16/22 1339     Clinical Impression Statement Patient presented in clinic with no pain or limitations. Patient able to complete all therex as directed and was able to get into the floor recenty to do some exercise. Patient denies any excessive pain with ADLs or functional activities at home. Able to meet all goals but wishing to be placed on hold to monitor symptoms but continue HEP.    Personal Factors and Comorbidities Comorbidity 2    Comorbidities HTN, OA    Examination-Activity Limitations Sleep    Examination-Participation Restrictions Other    Stability/Clinical Decision Making Stable/Uncomplicated    Rehab Potential Good    PT Frequency 1x / week    PT Duration Other (comment)    PT Treatment/Interventions Cryotherapy;Electrical Stimulation;Moist Heat;Neuromuscular re-education;Therapeutic exercise;Therapeutic activities;Patient/family education;Manual techniques;Dry needling;Passive range of motion;Taping    PT Next Visit Plan recumbent bike, hip and lower extremity strengthening    Consulted and Agree with Plan of Care Patient             Patient will benefit from skilled therapeutic intervention in order to improve the following deficits and impairments:  Decreased strength, Pain  Visit Diagnosis: Muscle weakness (generalized)  Pain in right hip     Problem List Patient Active Problem List   Diagnosis Date Noted   Chronic constipation 09/18/2021   Neuropathic pain 08/22/2021   Lipoma    Lipoma of foot 06/11/2021   Lipoma of upper arm 06/11/2021   Acute pain of left knee 05/27/2021   Other chest pain 05/27/2021   Spitting suture 04/03/2020   Lipoma of abdominal wall 12/15/2019   Breast lipoma 12/15/2019   Lipoma of both lower extremities 12/15/2019   Primary insomnia 05/11/2018   Unilateral  primary osteoarthritis, left knee 05/11/2018   Hemorrhoids 01/22/2018   Allergic rhinitis 10/29/2015   Essential hypertension 10/29/2015  Upper airway cough syndrome 10/20/2014   Rationale for Evaluation and Treatment Rehabilitation   [place], [place] 01/16/2022, 1:43 PM  [facility] [place], [place], [address] Phone: [phone]   Fax:  [phone]  Name: Raimi Guillermo MRN: [id number] Date of Birth: [birth year]  PHYSICAL THERAPY DISCHARGE SUMMARY  Visits from Start of Care: 3  Current functional level related to goals / functional outcomes: Patient was able to meet all her goals for skilled physical therapy.    Remaining deficits: None    Education / Equipment: HEP    Patient agrees to discharge. Patient goals were met. Patient is being discharged due to meeting the stated rehab goals.  Jacqulynn Cadet, PT, DPT

## 2022-01-20 DIAGNOSIS — J45991 Cough variant asthma: Secondary | ICD-10-CM | POA: Insufficient documentation

## 2022-01-20 DIAGNOSIS — J3081 Allergic rhinitis due to animal (cat) (dog) hair and dander: Secondary | ICD-10-CM | POA: Insufficient documentation

## 2022-01-20 NOTE — Patient Instructions (Signed)
Kristina Mclaughlin , Thank you for taking time to come for your Medicare Wellness Visit. I appreciate your ongoing commitment to your health goals. Please review the following plan we discussed and let me know if I can assist you in the future.   Screening recommendations/referrals: Colonoscopy: Done 06/26/2014 - Repeat in 10 years Mammogram: Done 08/13/2021 - Repeat annually Bone Density: Done 01/10/2016 - Repeat in 5 years *due Recommended yearly ophthalmology/optometry visit for glaucoma screening and checkup Recommended yearly dental visit for hygiene and checkup  Vaccinations: Influenza vaccine: Done 05/27/2021 - Repeat annually Pneumococcal vaccine: Done 05/23/2019 & 10/17/2021 Tdap vaccine: Done 10/19/211 - Repeat in 10 years *due Shingles vaccine: Done  09/05/2019 & 11/28/2019    Covid-19:Done  10/24/2019 & 11/14/2019  Advanced directives: ***  Conditions/risks identified: ***  Next appointment: Follow up in one year for your annual wellness visit ***   Preventive Care 69 Years and Older, Female Preventive care refers to lifestyle choices and visits with your health care provider that can promote health and wellness. What does preventive care include? A yearly physical exam. This is also called an annual well check. Dental exams once or twice a year. Routine eye exams. Ask your health care provider how often you should have your eyes checked. Personal lifestyle choices, including: Daily care of your teeth and gums. Regular physical activity. Eating a healthy diet. Avoiding tobacco and drug use. Limiting alcohol use. Practicing safe sex. Taking low-dose aspirin every day. Taking vitamin and mineral supplements as recommended by your health care provider. What happens during an annual well check? The services and screenings done by your health care provider during your annual well check will depend on your age, overall health, lifestyle risk factors, and family history of  disease. Counseling  Your health care provider may ask you questions about your: Alcohol use. Tobacco use. Drug use. Emotional well-being. Home and relationship well-being. Sexual activity. Eating habits. History of falls. Memory and ability to understand (cognition). Work and work Statistician. Reproductive health. Screening  You may have the following tests or measurements: Height, weight, and BMI. Blood pressure. Lipid and cholesterol levels. These may be checked every 5 years, or more frequently if you are over 39 years old. Skin check. Lung cancer screening. You may have this screening every year starting at age 13 if you have a 30-pack-year history of smoking and currently smoke or have quit within the past 15 years. Fecal occult blood test (FOBT) of the stool. You may have this test every year starting at age 69. Flexible sigmoidoscopy or colonoscopy. You may have a sigmoidoscopy every 5 years or a colonoscopy every 10 years starting at age 69. Hepatitis C blood test. Hepatitis B blood test. Sexually transmitted disease (STD) testing. Diabetes screening. This is done by checking your blood sugar (glucose) after you have not eaten for a while (fasting). You may have this done every 1-3 years. Bone density scan. This is done to screen for osteoporosis. You may have this done starting at age 69. Mammogram. This may be done every 1-2 years. Talk to your health care provider about how often you should have regular mammograms. Talk with your health care provider about your test results, treatment options, and if necessary, the need for more tests. Vaccines  Your health care provider may recommend certain vaccines, such as: Influenza vaccine. This is recommended every year. Tetanus, diphtheria, and acellular pertussis (Tdap, Td) vaccine. You may need a Td booster every 10 years. Zoster vaccine. You may  need this after age 17. Pneumococcal 13-valent conjugate (PCV13) vaccine. One  dose is recommended after age 5. Pneumococcal polysaccharide (PPSV23) vaccine. One dose is recommended after age 77. Talk to your health care provider about which screenings and vaccines you need and how often you need them. This information is not intended to replace advice given to you by your health care provider. Make sure you discuss any questions you have with your health care provider. Document Released: 08/17/2015 Document Revised: 04/09/2016 Document Reviewed: 05/22/2015 Elsevier Interactive Patient Education  2017 [place] Prevention in the Home Falls can cause injuries. They can happen to people of all ages. There are many things you can do to make your home safe and to help prevent falls. What can I do on the outside of my home? Regularly fix the edges of walkways and driveways and fix any cracks. Remove anything that might make you trip as you walk through a door, such as a raised step or threshold. Trim any bushes or trees on the path to your home. Use bright outdoor lighting. Clear any walking paths of anything that might make someone trip, such as rocks or tools. Regularly check to see if handrails are loose or broken. Make sure that both sides of any steps have handrails. Any raised decks and porches should have guardrails on the edges. Have any leaves, snow, or ice cleared regularly. Use sand or salt on walking paths during winter. Clean up any spills in your garage right away. This includes oil or grease spills. What can I do in the bathroom? Use night lights. Install grab bars by the toilet and in the tub and shower. Do not use towel bars as grab bars. Use non-skid mats or decals in the tub or shower. If you need to sit down in the shower, use a plastic, non-slip stool. Keep the floor dry. Clean up any water that spills on the floor as soon as it happens. Remove soap buildup in the tub or shower regularly. Attach bath mats securely with double-sided  non-slip rug tape. Do not have throw rugs and other things on the floor that can make you trip. What can I do in the bedroom? Use night lights. Make sure that you have a light by your bed that is easy to reach. Do not use any sheets or blankets that are too big for your bed. They should not hang down onto the floor. Have a firm chair that has side arms. You can use this for support while you get dressed. Do not have throw rugs and other things on the floor that can make you trip. What can I do in the kitchen? Clean up any spills right away. Avoid walking on wet floors. Keep items that you use a lot in easy-to-reach places. If you need to reach something above you, use a strong step stool that has a grab bar. Keep electrical cords out of the way. Do not use floor polish or wax that makes floors slippery. If you must use wax, use non-skid floor wax. Do not have throw rugs and other things on the floor that can make you trip. What can I do with my stairs? Do not leave any items on the stairs. Make sure that there are handrails on both sides of the stairs and use them. Fix handrails that are broken or loose. Make sure that handrails are as long as the stairways. Check any carpeting to make sure that it is firmly attached  to the stairs. Fix any carpet that is loose or worn. Avoid having throw rugs at the top or bottom of the stairs. If you do have throw rugs, attach them to the floor with carpet tape. Make sure that you have a light switch at the top of the stairs and the bottom of the stairs. If you do not have them, ask someone to add them for you. What else can I do to help prevent falls? Wear shoes that: Do not have high heels. Have rubber bottoms. Are comfortable and fit you well. Are closed at the toe. Do not wear sandals. If you use a stepladder: Make sure that it is fully opened. Do not climb a closed stepladder. Make sure that both sides of the stepladder are locked into place. Ask  someone to hold it for you, if possible. Clearly mark and make sure that you can see: Any grab bars or handrails. First and last steps. Where the edge of each step is. Use tools that help you move around (mobility aids) if they are needed. These include: Canes. Walkers. Scooters. Crutches. Turn on the lights when you go into a dark area. Replace any light bulbs as soon as they burn out. Set up your furniture so you have a clear path. Avoid moving your furniture around. If any of your floors are uneven, fix them. If there are any pets around you, be aware of where they are. Review your medicines with your doctor. Some medicines can make you feel dizzy. This can increase your chance of falling. Ask your doctor what other things that you can do to help prevent falls. This information is not intended to replace advice given to you by your health care provider. Make sure you discuss any questions you have with your health care provider. Document Released: 05/17/2009 Document Revised: 12/27/2015 Document Reviewed: 08/25/2014 Elsevier Interactive Patient Education  2017 [employer].

## 2022-01-21 ENCOUNTER — Ambulatory Visit (INDEPENDENT_AMBULATORY_CARE_PROVIDER_SITE_OTHER): Payer: Medicare HMO

## 2022-01-21 VITALS — Wt 238.0 lb

## 2022-01-21 DIAGNOSIS — Z0000 Encounter for general adult medical examination without abnormal findings: Secondary | ICD-10-CM

## 2022-01-21 NOTE — Progress Notes (Signed)
Subjective:   Kristina Mclaughlin is a 70 y.o. female who presents for Medicare Annual (Subsequent) preventive examination.  Virtual Visit via Telephone Note  I connected with  Kristina Mclaughlin on 01/21/22 at 11:15 AM EDT by telephone and verified that I am speaking with the correct person using two identifiers.  Location: Patient: Home Provider: [facility] Persons participating in the virtual visit: patient/Nurse Health Advisor   I discussed the limitations, risks, security and privacy concerns of performing an evaluation and management service by telephone and the availability of in person appointments. The patient expressed understanding and agreed to proceed.  Interactive audio and video telecommunications were attempted between this nurse and patient, however failed, due to patient having technical difficulties OR patient did not have access to video capability.  We continued and completed visit with audio only.  Some vital signs may be absent or patient reported.   Zaydyn Havey E [place] Defina, LPN   Review of Systems     Cardiac Risk Factors include: advanced age (>12mn, >>68women);obesity (BMI >30kg/m2);hypertension     Objective:    Today's Vitals   01/21/22 1111  Weight: 238 lb (108 kg)   Body mass index is 35.15 kg/m.     01/21/2022   11:23 AM 12/25/2021    4:19 PM 07/03/2021    6:33 AM 07/01/2021   10:04 AM 01/08/2021   11:10 AM 03/22/2019   11:34 AM  Advanced Directives  Does Patient Have a Medical Advance Directive? Yes No Yes Yes Yes Yes  Type of AParamedicof AGlen AllenLiving will  H[place]Living will  [place]Living will Living will  Does patient want to make changes to medical advance directive?   No - Patient declined No - Patient declined No - Patient declined No - Patient declined  Copy of [place] Chart? No - copy requested  No - copy requested       Current  Medications (verified) Outpatient Encounter Medications as of 01/21/2022  Medication Sig   albuterol (VENTOLIN HFA) 108 (90 Base) MCG/ACT inhaler TAKE 2 PUFFS BY MOUTH EVERY 6 HOURS AS NEEDED FOR WHEEZE OR SHORTNESS OF BREATH   amLODipine (NORVASC) 5 MG tablet TAKE 1 TABLET (5 MG TOTAL) BY MOUTH DAILY. TO REPLACE METOPROLOL   atorvastatin (LIPITOR) 40 MG tablet Take 1 tablet (40 mg total) by mouth daily.   Azelastine HCl 137 MCG/SPRAY SOLN Place into both nostrils.   calcium carbonate (OSCAL) 1500 (600 Ca) MG TABS tablet Take 600 mg of elemental calcium by mouth daily with breakfast.   fexofenadine (ALLEGRA) 180 MG tablet Take 1 tablet (180 mg total) by mouth daily.   fluticasone (FLONASE) 50 MCG/ACT nasal spray Place 2 sprays into both nostrils daily.   Multiple Vitamin (MULTI-VITAMIN PO) Take 1 tablet by mouth daily.    pantoprazole (PROTONIX) 40 MG tablet Take 1 tablet (40 mg total) by mouth 2 (two) times daily before a meal.   Polyethylene Glycol 400 (BLINK TEARS OP) Place 1 drop into both eyes 2 (two) times daily as needed (dry eyes).   sucralfate (CARAFATE) 1 g tablet Take 1 tablet (1 g total) by mouth 4 (four) times daily -  before meals and at bedtime.   SUMAtriptan (IMITREX) 100 MG tablet TAKE 1 TABLET AS NEED FOR MIGRAINE. MAY REPEAT IN 2 HOURS IF HEADACHE PERSISTS OR RECURS.   SYMBICORT 80-4.5 MCG/ACT inhaler USE 2 INHALATIONS ORALLY   TWICE DAILY.  Patient has  been using. Please fill.   traZODone (DESYREL) 150 MG tablet Take 1 tablet (150 mg total) by mouth at bedtime as needed for sleep.   trospium (SANCTURA) 20 MG tablet TAKE 1 TABLET BY MOUTH TWICE A DAY   valsartan-hydrochlorothiazide (DIOVAN-HCT) 320-25 MG tablet Take 1 tablet by mouth daily.   diclofenac Sodium (VOLTAREN) 1 % GEL Apply 2 g topically 4 (four) times daily. (Patient not taking: Reported on 12/25/2021)   EPINEPHrine 0.3 mg/0.3 mL IJ SOAJ injection Inject 0.3 mg into the muscle as needed for anaphylaxis.   ondansetron  (ZOFRAN) 4 MG tablet Take 1 tablet (4 mg total) by mouth every 8 (eight) hours as needed. (Patient not taking: Reported on 12/25/2021)   [DISCONTINUED] dextromethorphan (TUSSIN COUGH) 15 MG/5ML syrup Take 10 mLs (30 mg total) by mouth 4 (four) times daily as needed for cough. (Patient not taking: Reported on 12/25/2021)   [DISCONTINUED] gabapentin (NEURONTIN) 100 MG capsule Take 1 capsule (100 mg total) by mouth 3 (three) times daily.   No facility-administered encounter medications on file as of 01/21/2022.    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, lateral back   Essential hypertension 10/29/2015   Hypertension    Insomnia    Past Surgical History:  Procedure Laterality Date   ABDOMINAL HYSTERECTOMY     BACK SURGERY     BREAST SURGERY     biopsy x2; reduction   EXAMINATION UNDER ANESTHESIA  08/27/2012   LIPOMA EXCISION Right 07/03/2021   Procedure: EXCISION LIPOMA; ANKLE;  Surgeon: Virl Cagey, MD;  Location: [facility];  Service: General;  Laterality: Right;   LIPOMA EXCISION N/A 07/03/2021   Procedure: EXCISION LIPOMA; SUPRAPUBIC;  Surgeon: Virl Cagey, MD;  Location: [facility];  Service: General;  Laterality: N/A;   MASS EXCISION Left 07/03/2021   Procedure: EXCISION LIPOMA; ANKLE;  Surgeon: Virl Cagey, MD;  Location: [facility];  Service: General;  Laterality: Left;   MASS EXCISION Left 07/03/2021   Procedure: EXCISION OF LIPOMA; ARM;  Surgeon: Virl Cagey, MD;  Location: [facility];  Service: General;  Laterality: Left;   REDUCTION MAMMAPLASTY Bilateral 2002   Family History  Problem Relation Age of Onset   Bladder Cancer Mother    Allergies Mother    Heart disease Mother    Allergies Sister     Alzheimer's disease Father    Breast cancer Neg Hx    Social History   Socioeconomic History   Marital status: Married    Spouse name: Not on file   Number of children: 2   Years of education: Not on file   Highest education level: Not on file  Occupational History   Occupation: Purchasing   Tobacco Use   Smoking status: Never   Smokeless tobacco: Never  Vaping Use   Vaping Use: Never used  Substance and Sexual Activity   Alcohol use: No    Alcohol/week: 0.0 standard drinks of alcohol   Drug use: No   Sexual activity: Not on file  Other Topics Concern   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  She tries to stay active.   Social Determinants of Health   Financial  Resource Strain: Low Risk  (01/21/2022)   Overall Financial Resource Strain (CARDIA)    Difficulty of Paying Living Expenses: Not hard at all  Food Insecurity: No Food Insecurity (01/21/2022)   Hunger Vital Sign    Worried About Running Out of Food in the Last Year: Never true    Ran Out of Food in the Last Year: Never true  Transportation Needs: No Transportation Needs (01/21/2022)   PRAPARE - Hydrologist (Medical): No    Lack of Transportation (Non-Medical): No  Physical Activity: Sufficiently Active (01/21/2022)   Exercise Vital Sign    Days of Exercise per Week: 3 days    Minutes of Exercise per Session: 60 min  Stress: No Stress Concern Present (01/21/2022)   [place]    Feeling of Stress : Not at all  Social Connections: [place] (01/21/2022)   Social Connection and Isolation Panel [NHANES]    Frequency of Communication with Friends and Family: More than three times a week    Frequency of Social Gatherings with Friends and Family: More than three times a week    Attends  Religious Services: More than 4 times per year    Active Member of [employer] or Organizations: Yes    Attends Music therapist: More than 4 times per year    Marital Status: Married    Tobacco Counseling Counseling given: Not Answered   Clinical Intake:  Pre-visit preparation completed: Yes  Pain : No/denies pain     BMI - recorded: 35.15 Nutritional Status: BMI > 30  Obese Nutritional Risks: None Diabetes: No  How often do you need to have someone help you when you read instructions, pamphlets, or other written materials from your doctor or pharmacy?: 1 - Never  Diabetic? no  Interpreter Needed?: No  Information entered by :: Aubreanna Percle, LPN   Activities of Daily Living    01/21/2022   11:14 AM 07/01/2021   10:08 AM  In your present state of health, do you have any difficulty performing the following activities:  Hearing? 1   Comment Left ear   Vision? 0   Difficulty concentrating or making decisions? 0   Walking or climbing stairs? 1   Comment hx of knee injuries   Dressing or bathing? 0   Doing errands, shopping? 0 0  Preparing Food and eating ? N   Using the Toilet? N   In the past six months, have you accidently leaked urine? N   Do you have problems with loss of bowel control? N   Managing your Medications? N   Managing your Finances? N   Housekeeping or managing your Housekeeping? N     Patient Care Team: Janora Norlander, DO as PCP - General (Family Medicine) Lavonna Monarch, MD as Consulting Physician (Dermatology) Tiajuana Amass, MD as Referring Physician (Allergy and Immunology) Celestia Khat, OD (Optometry)  Indicate any recent Medical Services you may have received from other than [facility] providers in the past year (date may be approximate).     Assessment:   This is a routine wellness examination for Kristina Mclaughlin.  Hearing/Vision screen Hearing Screening - Comments:: Mild hearing trouble in Left ear only - declines hearing aid Vision  Screening - Comments:: Wears rx glasses - up to date with routine eye exams with [facility]  Dietary issues and exercise activities discussed: Current Exercise Habits: Home exercise routine, Type of exercise: walking;stretching;strength training/weights, Time (Minutes):  60, Frequency (Times/Week): 3, Weekly Exercise (Minutes/Week): 180, Intensity: Moderate, Exercise limited by: orthopedic condition(s)   Goals Addressed             This Visit's Progress    Weight (lb) < 200 lb (90.7 kg)   238 lb (108 kg)    Goal is to be below 200 lbs Hopes knees get better so she can stay active and independent       Depression Screen    01/21/2022   11:19 AM 09/18/2021    9:07 AM 05/27/2021    4:12 PM 03/21/2021    3:44 PM 03/08/2021    1:21 PM 01/08/2021   11:06 AM 12/27/2020   11:19 AM  PHQ 2/9 Scores  PHQ - 2 Score 0 0 0 0 0 0 0    Fall Risk    01/21/2022   11:13 AM 09/18/2021    9:07 AM 05/27/2021    4:11 PM 03/21/2021    3:44 PM 03/08/2021    1:20 PM  [place] in the past year? 1 0 1 0 1  Number falls in past yr: 0  0  0  Injury with Fall? 0  0  1  Risk for fall due to : History of fall(s);Orthopedic patient  History of fall(s)  History of fall(s)  Follow up Falls prevention discussed;Education provided    Falls evaluation completed    FALL RISK PREVENTION PERTAINING TO THE HOME:  Any stairs in or around the home? Yes  If so, are there any without handrails? No  Home free of loose throw rugs in walkways, pet beds, electrical cords, etc? Yes  Adequate lighting in your home to reduce risk of falls? Yes   ASSISTIVE DEVICES UTILIZED TO PREVENT FALLS:  Life alert? No  Use of a cane, walker or w/c? No  Grab bars in the bathroom? No  Shower chair or bench in shower? No  Elevated toilet seat or a handicapped toilet? Yes   TIMED UP AND GO:  Was the test performed? No . Telephonic visit  Cognitive Function:        01/21/2022   11:21 AM 01/08/2021   11:10 AM 03/22/2019    11:31 AM  6CIT Screen  What Year? 0 points 0 points 0 points  What month? 0 points 0 points   What time? 0 points 0 points 0 points  Count back from 20 0 points 0 points 0 points  Months in reverse 0 points 0 points 0 points  Repeat phrase 4 points 0 points 0 points  Total Score 4 points 0 points     Immunizations Immunization History  Administered Date(s) Administered   Fluad Quad(high Dose 65+) 05/23/2019, 05/30/2020, 05/27/2021   Influenza, High Dose Seasonal PF 08/07/2016, 08/06/2017, 08/12/2018, 10/20/2019, 10/17/2021   Influenza,inj,quad, With Preservative 05/06/2017, 05/08/2018   Influenza-Unspecified 05/23/2015, 05/08/2018   PFIZER(Purple Top)SARS-COV-2 Vaccination 10/24/2019, 11/14/2019   Pneumococcal Conjugate-13 05/23/2019   Pneumococcal Polysaccharide-23 08/07/2016, 08/06/2017, 06/09/2018, 08/12/2018, 10/20/2019, 10/17/2021   Pneumococcal-Unspecified 05/23/2015   Zoster Recombinat (Shingrix) 09/05/2019, 11/28/2019   Zoster, Live 06/20/2015    TDAP status: Due, Education has been provided regarding the importance of this vaccine. Advised may receive this vaccine at local pharmacy or Health Dept. Aware to provide a copy of the vaccination record if obtained from local pharmacy or Health Dept. Verbalized acceptance and understanding.  Flu Vaccine status: Up to date  Pneumococcal vaccine status: Up to date  Covid-19 vaccine status: Completed vaccines  Qualifies  for Shingles Vaccine? Yes   Zostavax completed Yes   Shingrix Completed?: Yes  Screening Tests Health Maintenance  Topic Date Due   COVID-19 Vaccine (3 - Pfizer risk series) 12/12/2019   TETANUS/TDAP  05/22/2020   DEXA SCAN  01/09/2021   INFLUENZA VACCINE  03/04/2022   MAMMOGRAM  08/13/2022   COLONOSCOPY (Pts 45-2yr Insurance coverage will need to be confirmed)  06/26/2024   Pneumonia Vaccine 69 Years old  Completed   Hepatitis C Screening  Completed   Zoster Vaccines- Shingrix  Completed   HPV  VACCINES  Aged Out    Health Maintenance  Health Maintenance Due  Topic Date Due   COVID-19 Vaccine (3 - Pfizer risk series) 12/12/2019   TETANUS/TDAP  05/22/2020   DEXA SCAN  01/09/2021    Colorectal cancer screening: Type of screening: Colonoscopy. Completed 06/26/2014. Repeat every 10 years  Mammogram status: Completed 08/13/2021. Repeat every year  Bone Density status: Completed 01/10/2016. Results reflect: Bone density results: NORMAL. Repeat every 5 years. Due at next visit  Lung Cancer Screening: (Low Dose CT Chest recommended if Age 69-80years, 30 pack-year currently smoking OR have quit w/in 15years.) does not qualify.   Additional Screening:  Hepatitis C Screening: does qualify; Completed 10/20/2018  Vision Screening: Recommended annual ophthalmology exams for early detection of glaucoma and other disorders of the eye. Is the patient up to date with their annual eye exam?  Yes  Who is the provider or what is the name of the office in which the patient attends annual eye exams? [email or web address] is not established with a provider, would they like to be referred to a provider to establish care? No .   Dental Screening: Recommended annual dental exams for proper oral hygiene  Community Resource Referral / Chronic Care Management: CRR required this visit?  No   CCM required this visit?  No      Plan:     I have personally reviewed and noted the following in the patient's chart:   Medical and social history Use of alcohol, tobacco or illicit drugs  Current medications and supplements including opioid prescriptions.  Functional ability and status Nutritional status Physical activity Advanced directives List of other physicians Hospitalizations, surgeries, and ER visits in previous 12 months Vitals Screenings to include cognitive, depression, and falls Referrals and appointments  In addition, I have reviewed and discussed with patient certain preventive  protocols, quality metrics, and best practice recommendations. A written personalized care plan for preventive services as well as general preventive health recommendations were provided to patient.     ASandrea Hammond LPN   [id number]  Nurse Notes: None

## 2022-01-22 DIAGNOSIS — J3081 Allergic rhinitis due to animal (cat) (dog) hair and dander: Secondary | ICD-10-CM | POA: Diagnosis not present

## 2022-01-22 DIAGNOSIS — J301 Allergic rhinitis due to pollen: Secondary | ICD-10-CM | POA: Diagnosis not present

## 2022-01-22 DIAGNOSIS — J3089 Other allergic rhinitis: Secondary | ICD-10-CM | POA: Diagnosis not present

## 2022-02-06 ENCOUNTER — Telehealth: Payer: Self-pay | Admitting: Family Medicine

## 2022-02-06 NOTE — Telephone Encounter (Signed)
I have never heard of itching of the head as a sign of lung cancer.

## 2022-02-06 NOTE — Telephone Encounter (Signed)
Patient states that she saw on the Internet that people that their head itches that it a sign of lung cancer. Patient is concerned because she said her head itches at the base of her skull.  She states that she has asthma and uses her inhaler regularly but she wants advise. I advised patient that I was not aware of this indication and sometimes there are untruths that are put out on the Internet as a scare tactic for individuals. I advised patient that I would ask providers recommendations.
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
## 2022-02-07 DIAGNOSIS — J3081 Allergic rhinitis due to animal (cat) (dog) hair and dander: Secondary | ICD-10-CM | POA: Diagnosis not present

## 2022-02-07 DIAGNOSIS — J301 Allergic rhinitis due to pollen: Secondary | ICD-10-CM | POA: Diagnosis not present

## 2022-02-07 DIAGNOSIS — J3089 Other allergic rhinitis: Secondary | ICD-10-CM | POA: Diagnosis not present

## 2022-02-10 ENCOUNTER — Encounter: Payer: Self-pay | Admitting: Family

## 2022-02-10 ENCOUNTER — Ambulatory Visit (INDEPENDENT_AMBULATORY_CARE_PROVIDER_SITE_OTHER): Payer: Medicare HMO | Admitting: Family

## 2022-02-10 VITALS — BP 145/77 | HR 62 | Temp 98.8°F | Ht 69.0 in | Wt 243.0 lb

## 2022-02-10 DIAGNOSIS — K2930 Chronic superficial gastritis without bleeding: Secondary | ICD-10-CM | POA: Insufficient documentation

## 2022-02-10 DIAGNOSIS — K6289 Other specified diseases of anus and rectum: Secondary | ICD-10-CM | POA: Insufficient documentation

## 2022-02-10 DIAGNOSIS — L219 Seborrheic dermatitis, unspecified: Secondary | ICD-10-CM

## 2022-02-10 DIAGNOSIS — K760 Fatty (change of) liver, not elsewhere classified: Secondary | ICD-10-CM | POA: Insufficient documentation

## 2022-02-10 DIAGNOSIS — K219 Gastro-esophageal reflux disease without esophagitis: Secondary | ICD-10-CM | POA: Insufficient documentation

## 2022-02-10 DIAGNOSIS — K625 Hemorrhage of anus and rectum: Secondary | ICD-10-CM | POA: Insufficient documentation

## 2022-02-10 DIAGNOSIS — K5900 Constipation, unspecified: Secondary | ICD-10-CM | POA: Diagnosis not present

## 2022-02-10 MED ORDER — KETOCONAZOLE 2 % EX SHAM
1.0000 | MEDICATED_SHAMPOO | CUTANEOUS | 0 refills | Status: DC
Start: 2022-02-10 — End: 2022-03-03

## 2022-02-10 NOTE — Patient Instructions (Signed)
Seborrheic Dermatitis, Adult Seborrheic dermatitis is a skin disease that causes red, scaly patches. It usually occurs on the scalp, and it is often called dandruff. The patches may appear on other parts of the body. Skin patches tend to appear where there are many oil glands in the skin. Areas of the body that are commonly affected include the: Scalp. Ears. Eyebrows. Face. Bearded area of [employer]. Skin folds of the body, such as the armpits, groin, and buttocks. Chest. The condition may come and go for no known reason, and it is often long-lasting (chronic). What are the causes? The cause of this condition is not known. What increases the risk? The following factors may make you more likely to develop this condition: Having certain conditions, such as: HIV (human immunodeficiency virus). AIDS (acquired immunodeficiency syndrome). Parkinson's disease. Mood disorders, such as depression. Being 72-3 years old. What are the signs or symptoms? Symptoms of this condition include: Thick scales on the scalp. Redness on the face or in the armpits. Skin that is flaky. The flakes may be white or yellow. Skin that seems oily or dry but is not helped with moisturizers. Itching or burning in the affected areas. How is this diagnosed? This condition is diagnosed with a medical history and physical exam. A sample of your skin may be tested (skin biopsy). You may need to see a skin specialist (dermatologist). How is this treated? There is no cure for this condition, but treatment can help to manage the symptoms. You may get treatment to remove scales, lower the risk of skin infection, and reduce swelling or itching. Treatment may include: Creams that reduce skin yeast. Medicated shampoo. Moisturizing creams or ointments. Creams that reduce swelling and irritation (steroids). Follow these instructions at home: Apply over-the-counter and prescription medicines only as told by your health care  provider. Use any medicated shampoo, skin creams, or ointments only as told by your health care provider. Keep all follow-up visits as told by your health care provider. This is important. Contact a health care provider if: Your symptoms do not improve with treatment. Your symptoms get worse. You have new symptoms. Get help right away if: Your condition rapidly worsens with treatment. Summary Seborrheic dermatitis is a skin disease that causes red, scaly patches. Seborrheic dermatitis commonly affects the scalp, face, and skin folds. There is no cure for this condition, but treatment can help to manage the symptoms. This information is not intended to replace advice given to you by your health care provider. Make sure you discuss any questions you have with your health care provider. Document Revised: 04/28/2019 Document Reviewed: 04/28/2019 Elsevier Patient Education  [place].

## 2022-02-10 NOTE — Progress Notes (Signed)
  Subjective:     Patient ID: Kristina Mclaughlin, female   DOB: 05/18/1951, 71 y.o.   MRN: [id number]  No chief complaint on file.   HPI PT presents to the office today with pruritis of her scalp that started a month ago. States this is unchanged. Denies any new products, fevers, discharge, or spreading of pruritis.   States she googled and states this could be a sign of lung cancer.   Review of Systems  All other systems reviewed and are negative.      Objective:   Physical Exam Vitals reviewed.  Constitutional:      General: She is not in acute distress.    Appearance: She is well-developed.  HENT:     Head: Normocephalic and atraumatic.  Eyes:     Pupils: Pupils are equal, round, and reactive to light.  Neck:     Thyroid: No thyromegaly.  Cardiovascular:     Rate and Rhythm: Normal rate and regular rhythm.     Heart sounds: Normal heart sounds. No murmur heard. Pulmonary:     Effort: Pulmonary effort is normal. No respiratory distress.     Breath sounds: Normal breath sounds. No wheezing.  Abdominal:     General: Bowel sounds are normal. There is no distension.     Palpations: Abdomen is soft.     Tenderness: There is no abdominal tenderness.  Musculoskeletal:        General: No tenderness. Normal range of motion.     Cervical back: Normal range of motion and neck supple.  Skin:    General: Skin is warm and dry.          Comments: Mild erythemas  Neurological:     Mental Status: She is alert and oriented to person, place, and time.     Cranial Nerves: No cranial nerve deficit.     Deep Tendon Reflexes: Reflexes are normal and symmetric.  Psychiatric:        Behavior: Behavior normal.        Thought Content: Thought content normal.        Judgment: Judgment normal.     BP (!) 145/77   Pulse 62   Temp 98.8 F (37.1 C)   Ht '5\' 9"'$  (1.753 m)   Wt 243 lb (110.2 kg)   SpO2 95%   BMI 35.88 kg/m      Plan:    Kristina Mclaughlin comes in today  with chief complaint of No chief complaint on file.   Diagnosis and orders addressed:  1. Seborrheic dermatitis Avoid using harsh chemicals  Use daily for next 2-3 days, then 2 times a week Keep clean and dry Avoid scratching  Follow up if symptoms worsen or do not improve  - ketoconazole (NIZORAL) 2 % shampoo; Apply 1 Application topically 2 (two) times a week.  Dispense: 240 mL; Refill: 0  Kristina Dun, FNP

## 2022-02-14 ENCOUNTER — Ambulatory Visit (INDEPENDENT_AMBULATORY_CARE_PROVIDER_SITE_OTHER): Payer: Medicare HMO

## 2022-02-14 ENCOUNTER — Ambulatory Visit (INDEPENDENT_AMBULATORY_CARE_PROVIDER_SITE_OTHER): Payer: Medicare HMO | Admitting: Family Medicine

## 2022-02-14 ENCOUNTER — Encounter: Payer: Self-pay | Admitting: Family Medicine

## 2022-02-14 VITALS — BP 130/77 | HR 78 | Temp 97.6°F | Ht 69.0 in | Wt 243.0 lb

## 2022-02-14 DIAGNOSIS — R059 Cough, unspecified: Secondary | ICD-10-CM

## 2022-02-14 DIAGNOSIS — R0602 Shortness of breath: Secondary | ICD-10-CM

## 2022-02-14 DIAGNOSIS — F4321 Adjustment disorder with depressed mood: Secondary | ICD-10-CM

## 2022-02-14 DIAGNOSIS — T17908A Unspecified foreign body in respiratory tract, part unspecified causing other injury, initial encounter: Secondary | ICD-10-CM

## 2022-02-14 DIAGNOSIS — K449 Diaphragmatic hernia without obstruction or gangrene: Secondary | ICD-10-CM | POA: Diagnosis not present

## 2022-02-14 DIAGNOSIS — J189 Pneumonia, unspecified organism: Secondary | ICD-10-CM | POA: Diagnosis not present

## 2022-02-14 DIAGNOSIS — R69 Illness, unspecified: Secondary | ICD-10-CM | POA: Diagnosis not present

## 2022-02-14 MED ORDER — METHYLPREDNISOLONE ACETATE 40 MG/ML IJ SUSP
40.0000 mg | Freq: Once | INTRAMUSCULAR | Status: AC
  Administered 2022-02-14: 40 mg via INTRAMUSCULAR

## 2022-02-14 MED ORDER — PREDNISONE 20 MG PO TABS: ORAL_TABLET | ORAL | 0 refills | Status: DC

## 2022-02-14 MED ORDER — AMOXICILLIN-POT CLAVULANATE 875-125 MG PO TABS: 1.0000 | ORAL_TABLET | Freq: Two times a day (BID) | ORAL | 0 refills | Status: DC

## 2022-02-14 MED ORDER — IPRATROPIUM-ALBUTEROL 0.5-2.5 (3) MG/3ML IN SOLN
3.0000 mL | Freq: Once | RESPIRATORY_TRACT | Status: AC
  Administered 2022-02-14: 3 mL via RESPIRATORY_TRACT

## 2022-02-14 NOTE — Patient Instructions (Addendum)
Start Prednisone tomorrow Start Augmentin TODAY.  Should be able to get both doses in if you go now to get it Continue Albuterol every 6 hours for the next 2 days. Then go to only as needed Continue Symbicort. Will call once the xray is read by the radiologist.  Pneumonitis Pneumonitis is inflammation of the lungs. This condition can be caused by an infection or exposure to certain substances. It can also be caused by things that can give you an allergic reaction (allergens). What are the causes? This condition may be caused by: An infection from bacteria or a virus (pneumonia). Exposure to certain substances in the workplace. These workplaces include farms and certain industries. Some substances that can cause this condition include: Asbestos. Silica. Inhaled acids. Inhaled chlorine gas. Exposure to mold. A hot tub, sauna, or home humidifier can have mold growing in it, even if it looks clean. You can breathe in the mold through water vapor. Repeated exposure to bird feathers, bird feces, or other allergens. Medicines such as chemotherapy drugs, certain antibiotic medicines, and some heart medicines. Radiation therapy. Breathing stomach contents, food, or liquids into the lungs. What are the signs or symptoms? Symptoms of this condition include: Shortness of breath or trouble breathing. This is the most common symptom. Cough. Fever. Chest tightness. Decreased energy. Decreased appetite. How is this diagnosed? This condition may be diagnosed based on: Your medical history. A physical exam. Blood tests. Other tests, including: A pulmonary function test (PFT). This measures how well your lungs work. A chest X-ray. A CT scan of the lungs. A bronchoscopy. In this procedure, your health care provider looks at your airways through an instrument called a bronchoscope. A lung biopsy. In this procedure, your health care provider takes a small piece of tissue from your lungs to look at  it under a microscope. How is this treated? Treatment depends on the cause of the condition. If the cause is exposure to a substance, avoiding further exposure to that substance will help reduce your symptoms. If needed, treatment may include: Corticosteroid medicine. This can help decrease inflammation. Antibiotic medicine. This can help fight an infection caused by bacteria. Medicines that help relax the muscles and make breathing easier, such as bronchodilators or inhalers. Oxygen therapy. Follow these instructions at home:  Medicines Take over-the-counter and prescription medicines only as told by your health care provider. If you were prescribed an antibiotic medicine, take it as told by your health care provider. Do not stop using the antibiotic even if you start to feel better. If you were prescribed an inhaler, keep it with you at all times. Use it only as told by your health care provider. General instructions Avoid exposure to any substance that caused your pneumonitis. If you need to work with substances that can cause this condition, use an air purifier while working and wear a mask to protect your lungs. Do not use any products that contain nicotine or tobacco. These products include cigarettes, chewing tobacco, and vaping devices, such as e-cigarettes. If you need help quitting, ask your health care provider. Keep all follow-up visits. This is important. Contact a health care provider if: You have a fever. Your symptoms get worse. Get help right away if: You have new or worse shortness of breath. You develop a blue color under your fingernails. Summary Pneumonitis is inflammation of the lungs. This condition can be caused by infection or exposure to certain substances or allergens. The most common symptom of this condition is shortness  of breath or trouble breathing. Treatment depends on the cause of your condition. This information is not intended to replace advice given to  you by your health care provider. Make sure you discuss any questions you have with your health care provider. Document Revised: 08/11/2021 Document Reviewed: 08/11/2021 Elsevier Patient Education  [place].

## 2022-02-14 NOTE — Progress Notes (Signed)
Subjective: CC: Cough PCP: Janora Norlander, DO GUR:[id number] Kristina Mclaughlin is a 71 y.o. female presenting to clinic today for:  1.  Cough Patient reports that she has had a cough for 5 days.  Cough is so harsh it takes her breath away.  This onset seemingly after she had a really horrible episode of acid reflux when she was sleeping.  She is not sure if she aspirated but she has spells of severe cough, increased mucus production.  Her PPI was recently reduced by her specialist.  She has been utilizing her albuterol inhaler as needed coughing spells.  No brown sputum reported.  No hemoptysis.  No fevers reported.  2.  Grief Patient is tearful today because she is she is grieving the loss of her friend, Vernie Ammons.  She was buried yesterday.  She notes that going back to see her at [facility] brought a lot of of sad memories surrounding her parents' death.  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 TO REPLACE METOPROLOL, Disp: 90 tablet, Rfl: 0   atorvastatin (LIPITOR) 40 MG tablet, Take 1 tablet (40 mg total) by mouth daily., Disp: 90 tablet, Rfl: 3   Azelastine HCl 137 MCG/SPRAY SOLN, Place into both nostrils., Disp: , Rfl:    calcium carbonate  (OSCAL) 1500 (600 Ca) MG TABS tablet, Take 600 mg of elemental calcium by mouth daily with breakfast., Disp: , Rfl:    diclofenac Sodium (VOLTAREN) 1 % GEL, Apply 2 g topically 4 (four) times daily., Disp: 50 g, Rfl: 1   EPINEPHrine 0.3 mg/0.3 mL IJ SOAJ injection, Inject 0.3 mg into the muscle as needed for anaphylaxis., Disp: , Rfl:    fexofenadine (ALLEGRA) 180 MG tablet, Take 1 tablet (180 mg total) by mouth daily., Disp: 90 tablet, Rfl: 3   fluticasone (FLONASE) 50 MCG/ACT nasal spray, Place 2 sprays into both nostrils daily., Disp: 48 g, Rfl: 3   ketoconazole (NIZORAL) 2 % shampoo, Apply 1 Application topically 2 (two) times a week., Disp: 240 mL, Rfl: 0   Multiple Vitamin (MULTI-VITAMIN PO), Take 1 tablet by mouth daily. , Disp: , Rfl:    ondansetron (ZOFRAN) 4 MG tablet, Take 1 tablet (4 mg total) by mouth every 8 (eight) hours as needed., Disp: 30 tablet, Rfl: 1   pantoprazole (PROTONIX) 40 MG tablet, Take 1 tablet (40 mg total) by mouth 2 (two) times daily before a meal., Disp: 180 tablet, Rfl: 3   Polyethylene Glycol 400 (BLINK TEARS OP), Place 1 drop into both eyes 2 (two) times daily as needed (dry eyes)., Disp: , Rfl:    sucralfate (CARAFATE) 1 g tablet, Take 1 tablet (1 g total) by mouth 4 (four) times daily -  before meals and at bedtime., Disp: 360 tablet, Rfl: 2   SUMAtriptan (IMITREX) 100 MG tablet, TAKE 1 TABLET AS NEED FOR MIGRAINE. MAY REPEAT IN 2 HOURS IF HEADACHE PERSISTS OR RECURS., Disp: 9 tablet, Rfl: 2   SYMBICORT 80-4.5 MCG/ACT inhaler, USE 2 INHALATIONS ORALLY   TWICE DAILY.  Patient has been using. Please fill., Disp: 10.2 g, Rfl: 1   traZODone (DESYREL) 150 MG tablet, Take 1 tablet (150 mg total) by mouth at bedtime as needed for sleep., Disp: 90 tablet, Rfl: 3   trospium (SANCTURA) 20 MG tablet, TAKE 1 TABLET BY MOUTH TWICE A DAY, Disp: 180 tablet, Rfl: 0   valsartan-hydrochlorothiazide (DIOVAN-HCT) 320-25 MG tablet, Take 1 tablet by mouth daily., Disp: 90 tablet, Rfl:  3 Social History   Socioeconomic History   Marital status: Married    Spouse name: Not on file   Number of children: 2   Years of education: Not on file   Highest education level: Not on file  Occupational History   Occupation: Purchasing   Tobacco Use   Smoking status: Never   Smokeless tobacco: Never  Vaping Use   Vaping Use: Never used  Substance and Sexual Activity   Alcohol use: No    Alcohol/week: 0.0 standard drinks of alcohol   Drug use: No   Sexual activity: Not on file  Other Topics Concern   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  She tries to stay active.   Social Determinants of Health   Financial Resource Strain: Low Risk  (01/21/2022)   Overall Financial Resource Strain (CARDIA)    Difficulty of Paying Living Expenses: Not hard at all  Food Insecurity: No Food Insecurity (01/21/2022)   Hunger Vital Sign    Worried About Running Out of Food in the Last Year: Never true    Ran Out of Food in the Last Year: Never true  Transportation Needs: No Transportation Needs (01/21/2022)   PRAPARE - Hydrologist (Medical): No    Lack of Transportation (Non-Medical): No  Physical Activity: Sufficiently Active (01/21/2022)   Exercise Vital Sign    Days of Exercise per Week: 3 days    Minutes of Exercise per Session: 60 min  Stress: No Stress Concern Present (01/21/2022)   [place]    Feeling of Stress : Not at all  Social Connections: [place] (01/21/2022)   Social Connection and Isolation Panel [NHANES]    Frequency of Communication with Friends and Family: More than three times a week    Frequency of Social Gatherings with Friends and Family: More than three times a week    Attends Religious Services: More than 4 times per year    Active  Member of [employer] or Organizations: Yes    Attends Music therapist: More than 4 times per year    Marital Status: Married  Human resources officer Violence: Not At Risk (01/21/2022)   Humiliation, Afraid, Rape, and Kick questionnaire    Fear of Current or Ex-Partner: No    Emotionally Abused: No    Physically Abused: No    Sexually Abused: No   Family History  Problem Relation Age of Onset   Bladder Cancer Mother    Allergies Mother    Heart disease Mother    Allergies Sister  Alzheimer's disease Father    Breast cancer Neg Hx     Objective: Office vital signs reviewed. BP 130/77   Pulse 78   Temp 97.6 F (36.4 C)   Ht '5\' 9"'$  (1.753 m)   Wt 243 lb (110.2 kg)   SpO2 94%   BMI 35.88 kg/m   Physical Examination:  General: Awake, alert, nontoxic female, No acute distress HEENT: Sclera white.  Moist mucous membranes Cardio: regular rate and rhythm, S1S2 heard, no murmurs appreciated Pulm: clear to auscultation bilaterally, no wheezes, rhonchi or rales; normal work of breathing on room air.  Harsh coughing witnessed and this did make it hard for the patient to catch her breath. Psych: Mood depressed.  Patient intermittently tearful     02/14/2022   11:37 AM 02/10/2022    2:53 PM 01/21/2022   11:19 AM  Depression screen PHQ 2/9  Decreased Interest 0 0 0  Down, Depressed, Hopeless 0 0 0  PHQ - 2 Score 0 0 0  Altered sleeping 0 0   Tired, decreased energy 0 0   Change in appetite 0 0   Feeling bad or failure about yourself  0 0   Trouble concentrating 0 0   Moving slowly or fidgety/restless 0 0   Suicidal thoughts 0 0   PHQ-9 Score 0 0   Difficult doing work/chores Not difficult at all Not difficult at all       02/14/2022   11:37 AM 02/10/2022    2:54 PM 09/18/2021    9:07 AM 03/08/2021    1:21 PM  GAD 7 : Generalized Anxiety Score  Nervous, Anxious, on Edge 0 0 0 0  Control/stop worrying 0 0 0 0  Worry too much - different things 0 0 0 0  Trouble relaxing 0 0  0 0  Restless 0 0 0 0  Easily annoyed or irritable 0 0 0 0  Afraid - awful might happen 0 0 0 0  Total GAD 7 Score 0 0 0 0  Anxiety Difficulty Not difficult at all Not difficult at all Not difficult at all Not difficult at all      No results found.  Assessment/ Plan: 71 y.o. female   Pneumonitis - Plan: ipratropium-albuterol (DUONEB) 0.5-2.5 (3) MG/3ML nebulizer solution 3 mL, methylPREDNISolone acetate (DEPO-MEDROL) injection 40 mg, predniSONE (DELTASONE) 20 MG tablet, amoxicillin-clavulanate (AUGMENTIN) 875-125 MG tablet  Cough in adult - Plan: DG Chest 2 View, ipratropium-albuterol (DUONEB) 0.5-2.5 (3) MG/3ML nebulizer solution 3 mL, methylPREDNISolone acetate (DEPO-MEDROL) injection 40 mg, predniSONE (DELTASONE) 20 MG tablet, amoxicillin-clavulanate (AUGMENTIN) 875-125 MG tablet  SOB (shortness of breath) - Plan: DG Chest 2 View, ipratropium-albuterol (DUONEB) 0.5-2.5 (3) MG/3ML nebulizer solution 3 mL, methylPREDNISolone acetate (DEPO-MEDROL) injection 40 mg, predniSONE (DELTASONE) 20 MG tablet, amoxicillin-clavulanate (AUGMENTIN) 875-125 MG tablet  Aspiration into airway, initial encounter - Plan: methylPREDNISolone acetate (DEPO-MEDROL) injection 40 mg, predniSONE (DELTASONE) 20 MG tablet, amoxicillin-clavulanate (AUGMENTIN) 875-125 MG tablet  Grief reaction  High suspicion for pneumonitis given reports of aspiration of acid.  I am going to empirically treat her with Augmentin, steroids.  She is given a breathing treatment during exam today.  Okay to continue albuterol inhaler as needed.  We will await formal read by radiologist but I cannot appreciate any acute infiltrates on her x-ray.  She understands red flag signs and symptoms warranting further evaluation.  Follow-up as needed on this issue  Patient is grieving the loss of her friend, Vernie Ammons, who was buried yesterday.  We  will gladly continue to support this patient as able  Orders Placed This Encounter  Procedures    DG Chest 2 View    Order Specific Question:   Reason for Exam (SYMPTOM  OR DIAGNOSIS REQUIRED)    Answer:   sob, cough    Order Specific Question:   Preferred imaging location?    Answer:   Internal   No orders of the defined types were placed in this encounter.    Janora Norlander, DO [place] [phone]

## 2022-02-21 DIAGNOSIS — J301 Allergic rhinitis due to pollen: Secondary | ICD-10-CM | POA: Diagnosis not present

## 2022-02-21 DIAGNOSIS — J3089 Other allergic rhinitis: Secondary | ICD-10-CM | POA: Diagnosis not present

## 2022-02-21 DIAGNOSIS — J3081 Allergic rhinitis due to animal (cat) (dog) hair and dander: Secondary | ICD-10-CM | POA: Diagnosis not present

## 2022-03-02 ENCOUNTER — Other Ambulatory Visit: Payer: Self-pay | Admitting: Family Medicine

## 2022-03-02 ENCOUNTER — Other Ambulatory Visit: Payer: Self-pay | Admitting: Family

## 2022-03-02 DIAGNOSIS — N3281 Overactive bladder: Secondary | ICD-10-CM

## 2022-03-02 DIAGNOSIS — L219 Seborrheic dermatitis, unspecified: Secondary | ICD-10-CM

## 2022-03-02 DIAGNOSIS — B349 Viral infection, unspecified: Secondary | ICD-10-CM | POA: Diagnosis not present

## 2022-03-02 DIAGNOSIS — R350 Frequency of micturition: Secondary | ICD-10-CM

## 2022-03-02 DIAGNOSIS — H65191 Other acute nonsuppurative otitis media, right ear: Secondary | ICD-10-CM | POA: Diagnosis not present

## 2022-03-02 DIAGNOSIS — H9202 Otalgia, left ear: Secondary | ICD-10-CM | POA: Diagnosis not present

## 2022-03-03 ENCOUNTER — Telehealth: Payer: Self-pay

## 2022-03-03 NOTE — Telephone Encounter (Signed)
Received a Access nurse call-   03/02/22 Caller states, called yesterday. Has COVID. Now today hearing is worse.  Loss of hearing.  Was on prednisone a few years ago.   I do not see where patient went to urgent care.   Attempted to contact patient to make sure she was taken care of and NA.  If patient calls back schedule an appointment if she is still having loss of hearing.

## 2022-03-03 NOTE — Telephone Encounter (Signed)
Has her rash not cleared?

## 2022-03-08 ENCOUNTER — Other Ambulatory Visit: Payer: Self-pay | Admitting: Family Medicine

## 2022-03-08 DIAGNOSIS — I10 Essential (primary) hypertension: Secondary | ICD-10-CM

## 2022-03-10 ENCOUNTER — Telehealth (INDEPENDENT_AMBULATORY_CARE_PROVIDER_SITE_OTHER): Payer: Medicare HMO | Admitting: Nurse Practitioner

## 2022-03-10 ENCOUNTER — Encounter: Payer: Self-pay | Admitting: Nurse Practitioner

## 2022-03-10 DIAGNOSIS — R059 Cough, unspecified: Secondary | ICD-10-CM

## 2022-03-10 MED ORDER — BENZONATATE 100 MG PO CAPS: 100.0000 mg | ORAL_CAPSULE | Freq: Three times a day (TID) | ORAL | 0 refills | Status: DC | PRN

## 2022-03-10 NOTE — Progress Notes (Deleted)
New Patient Note  RE: Kristina Mclaughlin MRN: [id number] DOB: April 12, 1951 Date of Office Visit: 03/10/2022  Chief Complaint: No chief complaint on file.  History of Present Illness: ***  Assessment and Plan: Kristina Mclaughlin is a 71 y.o. female with: No problem-specific Assessment & Plan notes found for this encounter.  Return if symptoms worsen or fail to improve.   Diagnostics:   Past Medical History: Patient Active Problem List   Diagnosis Date Noted   Acid reflux 02/10/2022   Chronic GERD 02/10/2022   Gastroesophageal reflux disease 02/10/2022   Chronic superficial gastritis without bleeding 02/10/2022   Fatty liver 02/10/2022   Rectal pain 02/10/2022   Rectal bleeding 02/10/2022   Allergic rhinitis due to animal (cat) (dog) hair and dander 01/20/2022   Cough variant asthma 01/20/2022   Chronic constipation 09/18/2021   Neuropathic pain 08/22/2021   Lipoma    Lipoma of foot 06/11/2021   Lipoma of upper arm 06/11/2021   Acute pain of left knee 05/27/2021   Other chest pain 05/27/2021   Sensorineural hearing loss (SNHL) of left ear with unrestricted hearing of right ear 12/11/2020   Spitting suture 04/03/2020   Lipoma of abdominal wall 12/15/2019   Breast lipoma 12/15/2019   Lipoma of both lower extremities 12/15/2019   Tear of medial meniscus of knee 09/06/2018   Pain in right knee 05/27/2018   Primary insomnia 05/11/2018   Unilateral primary osteoarthritis, left knee 05/11/2018   Hemorrhoids 01/22/2018   Allergic rhinitis 10/29/2015   Essential hypertension 10/29/2015   Upper airway cough syndrome 10/20/2014   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, lateral back   Essential hypertension 10/29/2015   Hypertension    Insomnia    Past Surgical History: Past Surgical History:  Procedure Laterality Date   ABDOMINAL HYSTERECTOMY     BACK SURGERY     BREAST SURGERY     biopsy x2;  reduction   EXAMINATION UNDER ANESTHESIA  08/27/2012   LIPOMA EXCISION Right 07/03/2021   Procedure: EXCISION LIPOMA; ANKLE;  Surgeon: Virl Cagey, MD;  Location: [facility];  Service: General;  Laterality: Right;   LIPOMA EXCISION N/A 07/03/2021   Procedure: EXCISION LIPOMA; SUPRAPUBIC;  Surgeon: Virl Cagey, MD;  Location: [facility];  Service: General;  Laterality: N/A;   MASS EXCISION Left 07/03/2021   Procedure: EXCISION LIPOMA; ANKLE;  Surgeon: Virl Cagey, MD;  Location: [facility];  Service: General;  Laterality: Left;   MASS EXCISION Left 07/03/2021   Procedure: EXCISION OF LIPOMA; ARM;  Surgeon: Virl Cagey, MD;  Location: [facility];  Service: General;  Laterality: Left;   REDUCTION MAMMAPLASTY Bilateral 2002   Medication List:  Current Outpatient Medications  Medication Sig Dispense Refill   benzonatate (TESSALON PERLES) 100 MG capsule Take 1 capsule (100 mg total) by mouth 3 (three) times daily as needed. 20 capsule 0   albuterol (VENTOLIN HFA) 108 (90 Base) MCG/ACT inhaler TAKE 2 PUFFS BY MOUTH EVERY 6 HOURS AS NEEDED FOR WHEEZE OR SHORTNESS OF BREATH 8.5 each 5   amLODipine (NORVASC) 5 MG tablet TAKE 1 TABLET (5 MG TOTAL) BY MOUTH DAILY. TO REPLACE METOPROLOL 90 tablet 0   amoxicillin-clavulanate (AUGMENTIN) 875-125 MG tablet Take 1 tablet by mouth 2 (two) times daily. 20 tablet 0   atorvastatin (LIPITOR) 40 MG tablet Take 1 tablet (40 mg total) by mouth daily. 90 tablet 3   Azelastine  HCl 137 MCG/SPRAY SOLN Place into both nostrils.     calcium carbonate (OSCAL) 1500 (600 Ca) MG TABS tablet Take 600 mg of elemental calcium by mouth daily with breakfast.     diclofenac Sodium (VOLTAREN) 1 % GEL Apply 2 g topically 4 (four) times daily. 50 g 1   EPINEPHrine 0.3 mg/0.3 mL IJ SOAJ injection Inject 0.3 mg into the muscle as needed for anaphylaxis.     fexofenadine (ALLEGRA) 180 MG tablet Take 1 tablet (180 mg total) by mouth daily. 90 tablet 3   fluticasone (FLONASE) 50  MCG/ACT nasal spray Place 2 sprays into both nostrils daily. 48 g 3   ketoconazole (NIZORAL) 2 % shampoo APPLY 1 APPLICATION TOPICALLY 2 (TWO) TIMES A WEEK. 240 mL 0   Multiple Vitamin (MULTI-VITAMIN PO) Take 1 tablet by mouth daily.      ondansetron (ZOFRAN) 4 MG tablet Take 1 tablet (4 mg total) by mouth every 8 (eight) hours as needed. 30 tablet 1   pantoprazole (PROTONIX) 40 MG tablet Take 1 tablet (40 mg total) by mouth 2 (two) times daily before a meal. 180 tablet 3   Polyethylene Glycol 400 (BLINK TEARS OP) Place 1 drop into both eyes 2 (two) times daily as needed (dry eyes).     predniSONE (DELTASONE) 20 MG tablet 2 po at same time daily for 3 days.  Start Saturday with breakfast 6 tablet 0   sucralfate (CARAFATE) 1 g tablet Take 1 tablet (1 g total) by mouth 4 (four) times daily -  before meals and at bedtime. 360 tablet 2   SUMAtriptan (IMITREX) 100 MG tablet TAKE 1 TABLET AS NEED FOR MIGRAINE. MAY REPEAT IN 2 HOURS IF HEADACHE PERSISTS OR RECURS. 9 tablet 2   SYMBICORT 80-4.5 MCG/ACT inhaler USE 2 INHALATIONS ORALLY   TWICE DAILY.  Patient has been using. Please fill. 10.2 g 1   traZODone (DESYREL) 150 MG tablet Take 1 tablet (150 mg total) by mouth at bedtime as needed for sleep. 90 tablet 3   trospium (SANCTURA) 20 MG tablet TAKE 1 TABLET BY MOUTH TWICE A DAY 180 tablet 0   valsartan-hydrochlorothiazide (DIOVAN-HCT) 320-25 MG tablet Take 1 tablet by mouth daily. 90 tablet 3   No current facility-administered medications for this visit.   Allergies: Allergies  Allergen Reactions   Lovenox [Enoxaparin Sodium] Anaphylaxis   Moxifloxacin Anaphylaxis and Other (See Comments)   Quinolones Hives and Shortness Of Breath   Guaifenesin Er    Mucinex Sinus-Max [Phenylephrine-Apap-Guaifenesin] Swelling   Sulfamethoxazole-Trimethoprim Other (See Comments)   Social History: Social History   Socioeconomic History   Marital status: Married    Spouse name: Not on file   Number of  children: 2   Years of education: Not on file   Highest education level: Not on file  Occupational History   Occupation: Purchasing   Tobacco Use   Smoking status: Never   Smokeless tobacco: Never  Vaping Use   Vaping Use: Never used  Substance and Sexual Activity   Alcohol use: No    Alcohol/week: 0.0 standard drinks of alcohol   Drug use: No   Sexual activity: Not on file  Other Topics Concern   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  She tries to stay active.   Social Determinants of Health   Financial Resource Strain: Low Risk  (01/21/2022)  Overall Financial Resource Strain (CARDIA)    Difficulty of Paying Living Expenses: Not hard at all  Food Insecurity: No Food Insecurity (01/21/2022)   Hunger Vital Sign    Worried About Running Out of Food in the Last Year: Never true    Ran Out of Food in the Last Year: Never true  Transportation Needs: No Transportation Needs (01/21/2022)   PRAPARE - Hydrologist (Medical): No    Lack of Transportation (Non-Medical): No  Physical Activity: Sufficiently Active (01/21/2022)   Exercise Vital Sign    Days of Exercise per Week: 3 days    Minutes of Exercise per Session: 60 min  Stress: No Stress Concern Present (01/21/2022)   [place]    Feeling of Stress : Not at all  Social Connections: [place] (01/21/2022)   Social Connection and Isolation Panel [NHANES]    Frequency of Communication with Friends and Family: More than three times a week    Frequency of Social Gatherings with Friends and Family: More than three times a week    Attends Religious Services: More than 4 times per year    Active Member of [employer] or Organizations: Yes    Attends Music therapist: More than 4 times per year     Marital Status: Married       Family History: Family History  Problem Relation Age of Onset   Bladder Cancer Mother    Allergies Mother    Heart disease Mother    Allergies Sister    Alzheimer's disease Father    Breast cancer Neg Hx          Review of Systems Objective: There were no vitals taken for this visit. There is no height or weight on file to calculate BMI. Physical Exam The plan was reviewed with the patient/family, and all questions/concerned were addressed.  It was my pleasure to see Kristina Mclaughlin today and participate in her care. Please feel free to contact me with any questions or concerns.  Sincerely,  Jac Canavan NP [place]

## 2022-03-10 NOTE — Progress Notes (Signed)
   Virtual Visit  Note Due to COVID-19 pandemic this visit was conducted virtually. This visit type was conducted due to national recommendations for restrictions regarding the COVID-19 Pandemic (e.g. social distancing, sheltering in place) in an effort to limit this patient's exposure and mitigate transmission in our community. All issues noted in this document were discussed and addressed.  A physical exam was not performed with this format.  I connected with Kristina Mclaughlin on 03/10/22 at 11:10 am  by telephone and verified that I am speaking with the correct person using two identifiers. Kristina Mclaughlin is currently located at home during visit. The provider, Ivy Lynn, NP is located in their office at time of visit.  I discussed the limitations, risks, security and privacy concerns of performing an evaluation and management service by telephone and the availability of in person appointments. I also discussed with the patient that there may be a patient responsible charge related to this service. The patient expressed understanding and agreed to proceed.   History and Present Illness:  Cough This is a recurrent problem. The current episode started 1 to 4 weeks ago. The problem has been unchanged. The problem occurs constantly. The cough is Non-productive. Pertinent negatives include no chest pain, chills, ear congestion, ear pain, nasal congestion, rash, sore throat or shortness of breath. Nothing aggravates the symptoms. recent Covid-19      Review of Systems  Constitutional: Negative.  Negative for chills.  HENT: Negative.  Negative for ear pain and sore throat.   Eyes: Negative.   Respiratory:  Positive for cough. Negative for shortness of breath.   Cardiovascular:  Negative for chest pain.  Skin: Negative.  Negative for itching and rash.  All other systems reviewed and are negative.    Observations/Objective: Televisit patient not in  distress.  Assessment and Plan: Take meds as prescribed - Use a cool mist humidifier  -Use saline nose sprays frequently -Force fluids -For fever or aches or pains- take Tylenol or ibuprofen. -Recently completed prednisone and antibiotics, -Tessalon Perles for cough. -Recent positive for COVID-19, completed chest x-ray which was negative for pneumonia, completed antibiotic treatment and prednisone.  Advised patient to give time for cough to clear up since it may take a little while for cough to resolve. Follow up with worsening unresolved symptoms  Follow Up Instructions: Follow-up with worsening unresolved symptoms.    I discussed the assessment and treatment plan with the patient. The patient was provided an opportunity to ask questions and all were answered. The patient agreed with the plan and demonstrated an understanding of the instructions.   The patient was advised to call back or seek an in-person evaluation if the symptoms worsen or if the condition fails to improve as anticipated.  The above assessment and management plan was discussed with the patient. The patient verbalized understanding of and has agreed to the management plan. Patient is aware to call the clinic if symptoms persist or worsen. Patient is aware when to return to the clinic for a follow-up visit. Patient educated on when it is appropriate to go to the emergency department.   Time call ended:  11:20 pm   I provided 10 minutes of  non face-to-face time during this encounter.    Ivy Lynn, NP

## 2022-03-19 ENCOUNTER — Ambulatory Visit (INDEPENDENT_AMBULATORY_CARE_PROVIDER_SITE_OTHER): Payer: Medicare HMO | Admitting: Family Medicine

## 2022-03-19 ENCOUNTER — Encounter: Payer: Self-pay | Admitting: Family Medicine

## 2022-03-19 VITALS — BP 120/74 | HR 87 | Temp 97.8°F | Ht 69.0 in | Wt 238.2 lb

## 2022-03-19 DIAGNOSIS — Z0001 Encounter for general adult medical examination with abnormal findings: Secondary | ICD-10-CM | POA: Diagnosis not present

## 2022-03-19 DIAGNOSIS — J3089 Other allergic rhinitis: Secondary | ICD-10-CM | POA: Diagnosis not present

## 2022-03-19 DIAGNOSIS — R7303 Prediabetes: Secondary | ICD-10-CM

## 2022-03-19 DIAGNOSIS — Z0000 Encounter for general adult medical examination without abnormal findings: Secondary | ICD-10-CM

## 2022-03-19 DIAGNOSIS — H938X2 Other specified disorders of left ear: Secondary | ICD-10-CM

## 2022-03-19 DIAGNOSIS — M8588 Other specified disorders of bone density and structure, other site: Secondary | ICD-10-CM | POA: Diagnosis not present

## 2022-03-19 DIAGNOSIS — I10 Essential (primary) hypertension: Secondary | ICD-10-CM

## 2022-03-19 DIAGNOSIS — J3081 Allergic rhinitis due to animal (cat) (dog) hair and dander: Secondary | ICD-10-CM | POA: Diagnosis not present

## 2022-03-19 DIAGNOSIS — E781 Pure hyperglyceridemia: Secondary | ICD-10-CM

## 2022-03-19 DIAGNOSIS — J301 Allergic rhinitis due to pollen: Secondary | ICD-10-CM | POA: Diagnosis not present

## 2022-03-19 NOTE — Progress Notes (Signed)
Kristina Mclaughlin is a 71 y.o. female presents to office today for annual physical exam examination.    Concerns today include: 1.  Left ear fullness She feels like her left ear is stopped up.  She had COVID-19 since her last visit notes it really took a lot out of her.  She still not back to baseline with energy but is has been pushing herself to exercise in efforts to regain some of the strength and energy she had prior to COVID-19.  She works out on a recumbent bike so as not to exacerbate any joint issues.  Occupation: Retired, Marital status: Married, Substance use: None Diet: No specific restriction but is actively working on weight loss, Exercise: Regularly at [employer] Last eye exam: Up-to-date Last dental exam: Up-to-date Last colonoscopy: Up-to-date Last mammogram: Up-to-date Last pap smear: N/A Refills needed today: None Immunizations needed: Immunization History  Administered Date(s) Administered   Fluad Quad(high Dose 65+) 05/23/2019, 05/30/2020, 05/27/2021   Influenza, High Dose Seasonal PF 08/07/2016, 08/06/2017, 08/12/2018, 10/20/2019, 10/17/2021   Influenza,inj,quad, With Preservative 05/06/2017, 05/08/2018   Influenza-Unspecified 05/23/2015, 05/08/2018   PFIZER(Purple Top)SARS-COV-2 Vaccination 10/24/2019, 11/14/2019   Pneumococcal Conjugate-13 05/23/2019   Pneumococcal Polysaccharide-23 08/07/2016, 08/06/2017, 06/09/2018, 08/12/2018, 10/20/2019, 10/17/2021   Pneumococcal-Unspecified 05/23/2015   Zoster Recombinat (Shingrix) 09/05/2019, 11/28/2019   Zoster, Live 06/20/2015     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, lateral back   Essential hypertension 10/29/2015   Hypertension    Insomnia    Social History   Socioeconomic History   Marital status: Married    Spouse name: Not on file   Number of children: 2   Years of education: Not on file   Highest education level: Not on  file  Occupational History   Occupation: Purchasing   Tobacco Use   Smoking status: Never   Smokeless tobacco: Never  Vaping Use   Vaping Use: Never used  Substance and Sexual Activity   Alcohol use: No    Alcohol/week: 0.0 standard drinks of alcohol   Drug use: No   Sexual activity: Not on file  Other Topics Concern   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  She tries to stay active.   Social Determinants of Health   Financial Resource Strain: Low Risk  (01/21/2022)   Overall Financial Resource Strain (CARDIA)    Difficulty of Paying Living Expenses: Not hard at all  Food Insecurity: No Food Insecurity (01/21/2022)   Hunger Vital Sign    Worried About Running Out of Food in the Last Year: Never true    Ran Out of Food in the Last Year: Never true  Transportation Needs: No Transportation Needs (01/21/2022)   PRAPARE - Hydrologist (Medical): No    Lack of Transportation (Non-Medical): No  Physical Activity: Sufficiently Active (01/21/2022)   Exercise Vital Sign    Days of Exercise per Week: 3 days    Minutes of Exercise per Session: 60 min  Stress: No Stress Concern Present (01/21/2022)   [place]    Feeling of Stress : Not at all  Social Connections: [place] (01/21/2022)   Social Connection and Isolation Panel [NHANES]    Frequency of Communication with Friends and  Family: More than three times a week    Frequency of Social Gatherings with Friends and Family: More than three times a week    Attends Religious Services: More than 4 times per year    Active Member of Clubs or Organizations: Yes    Attends Archivist Meetings: More than 4 times per year    Marital Status: Married  Human resources officer Violence: Not At Risk (01/21/2022)    Humiliation, Afraid, Rape, and Kick questionnaire    Fear of Current or Ex-Partner: No    Emotionally Abused: No    Physically Abused: No    Sexually Abused: No   Past Surgical History:  Procedure Laterality Date   ABDOMINAL HYSTERECTOMY     BACK SURGERY     BREAST SURGERY     biopsy x2; reduction   EXAMINATION UNDER ANESTHESIA  08/27/2012   LIPOMA EXCISION Right 07/03/2021   Procedure: EXCISION LIPOMA; ANKLE;  Surgeon: Virl Cagey, MD;  Location: [facility];  Service: General;  Laterality: Right;   LIPOMA EXCISION N/A 07/03/2021   Procedure: EXCISION LIPOMA; SUPRAPUBIC;  Surgeon: Virl Cagey, MD;  Location: [facility];  Service: General;  Laterality: N/A;   MASS EXCISION Left 07/03/2021   Procedure: EXCISION LIPOMA; ANKLE;  Surgeon: Virl Cagey, MD;  Location: [facility];  Service: General;  Laterality: Left;   MASS EXCISION Left 07/03/2021   Procedure: EXCISION OF LIPOMA; ARM;  Surgeon: Virl Cagey, MD;  Location: [facility];  Service: General;  Laterality: Left;   REDUCTION MAMMAPLASTY Bilateral 2002   Family History  Problem Relation Age of Onset   Bladder Cancer Mother    Allergies Mother    Heart disease Mother    Allergies Sister    Alzheimer's disease Father    Breast cancer Neg Hx     Current Outpatient Medications:    albuterol (VENTOLIN HFA) 108 (90 Base) MCG/ACT inhaler, TAKE 2 PUFFS BY MOUTH EVERY 6 HOURS AS NEEDED FOR WHEEZE OR SHORTNESS OF BREATH, Disp: 8.5 each, Rfl: 5   amLODipine (NORVASC) 5 MG tablet, TAKE 1 TABLET (5 MG TOTAL) BY MOUTH DAILY. TO REPLACE METOPROLOL, Disp: 90 tablet, Rfl: 0   atorvastatin (LIPITOR) 40 MG tablet, Take 1 tablet (40 mg total) by mouth daily., Disp: 90 tablet, Rfl: 3   Azelastine HCl 137 MCG/SPRAY SOLN, Place into both nostrils., Disp: , Rfl:    calcium carbonate (OSCAL) 1500 (600 Ca) MG TABS tablet, Take 600 mg of elemental calcium by mouth daily with breakfast., Disp: , Rfl:    diclofenac Sodium (VOLTAREN) 1 % GEL,  Apply 2 g topically 4 (four) times daily., Disp: 50 g, Rfl: 1   EPINEPHrine 0.3 mg/0.3 mL IJ SOAJ injection, Inject 0.3 mg into the muscle as needed for anaphylaxis., Disp: , Rfl:    fexofenadine (ALLEGRA) 180 MG tablet, Take 1 tablet (180 mg total) by mouth daily., Disp: 90 tablet, Rfl: 3   fluticasone (FLONASE) 50 MCG/ACT nasal spray, Place 2 sprays into both nostrils daily., Disp: 48 g, Rfl: 3   ketoconazole (NIZORAL) 2 % shampoo, APPLY 1 APPLICATION TOPICALLY 2 (TWO) TIMES A WEEK., Disp: 240 mL, Rfl: 0   linaCLOtide (LINZESS PO), Take by mouth., Disp: , Rfl:    Multiple Vitamin (MULTI-VITAMIN PO), Take 1 tablet by mouth daily. , Disp: , Rfl:    ondansetron (ZOFRAN) 4 MG tablet, Take 1 tablet (4 mg total) by mouth every 8 (eight) hours as needed., Disp: 30 tablet, Rfl: 1  pantoprazole (PROTONIX) 40 MG tablet, Take 1 tablet (40 mg total) by mouth 2 (two) times daily before a meal., Disp: 180 tablet, Rfl: 3   Polyethylene Glycol 400 (BLINK TEARS OP), Place 1 drop into both eyes 2 (two) times daily as needed (dry eyes)., Disp: , Rfl:    predniSONE (DELTASONE) 20 MG tablet, 2 po at same time daily for 3 days.  Start Saturday with breakfast, Disp: 6 tablet, Rfl: 0   sucralfate (CARAFATE) 1 g tablet, Take 1 tablet (1 g total) by mouth 4 (four) times daily -  before meals and at bedtime., Disp: 360 tablet, Rfl: 2   SUMAtriptan (IMITREX) 100 MG tablet, TAKE 1 TABLET AS NEED FOR MIGRAINE. MAY REPEAT IN 2 HOURS IF HEADACHE PERSISTS OR RECURS., Disp: 9 tablet, Rfl: 2   SYMBICORT 80-4.5 MCG/ACT inhaler, USE 2 INHALATIONS ORALLY   TWICE DAILY.  Patient has been using. Please fill., Disp: 10.2 g, Rfl: 1   traZODone (DESYREL) 150 MG tablet, Take 1 tablet (150 mg total) by mouth at bedtime as needed for sleep., Disp: 90 tablet, Rfl: 3   trospium (SANCTURA) 20 MG tablet, TAKE 1 TABLET BY MOUTH TWICE A DAY, Disp: 180 tablet, Rfl: 0   valsartan-hydrochlorothiazide (DIOVAN-HCT) 320-25 MG tablet, Take 1 tablet by  mouth daily., Disp: 90 tablet, Rfl: 3  Allergies  Allergen Reactions   Lovenox [Enoxaparin Sodium] Anaphylaxis   Moxifloxacin Anaphylaxis and Other (See Comments)   Quinolones Hives and Shortness Of Breath   Guaifenesin Er    Mucinex Sinus-Max [Phenylephrine-Apap-Guaifenesin] Swelling   Sulfamethoxazole-Trimethoprim Other (See Comments)     ROS: Review of Systems A comprehensive review of systems was negative except for: Constitutional: positive for fatigue Eyes: positive for contacts/glasses Gastrointestinal: positive for constipation Musculoskeletal: positive for arthralgias and back pain    Physical exam BP 120/74   Pulse 87   Temp 97.8 F (36.6 C)   Ht '5\' 9"'  (1.753 m)   Wt 238 lb 3.2 oz (108 kg)   SpO2 94%   BMI 35.18 kg/m  General appearance: alert, cooperative, appears stated age, and no distress Head: Normocephalic, without obvious abnormality, atraumatic Eyes: negative findings: lids and lashes normal, conjunctivae and sclerae normal, corneas clear, and pupils equal, round, reactive to light and accomodation Ears: normal TM's and external ear canals both ears Nose: Nares normal. Septum midline. Mucosa normal. No drainage or sinus tenderness. Throat: lips, mucosa, and tongue normal; teeth and gums normal Neck: no adenopathy, no carotid bruit, supple, symmetrical, trachea midline, and thyroid not enlarged, symmetric, no tenderness/mass/nodules Back: symmetric, no curvature. ROM normal. No CVA tenderness. Lungs: clear to auscultation bilaterally Heart: regular rate and rhythm, S1, S2 normal, no murmur, click, rub or gallop Abdomen: soft, non-tender; bowel sounds normal; no masses,  no organomegaly Extremities: extremities normal, atraumatic, no cyanosis or edema Pulses: 2+ and symmetric Skin:  Multiple pigmented nevi.  Patient sees dermatology yearly Lymph nodes: Cervical, supraclavicular, and axillary nodes normal. Neurologic: Grossly normal Psych: Somewhat tearful  when talking about her mother and best friend that passed away but otherwise very pleasant, interactive female     03/19/2022   10:45 AM 02/14/2022   11:37 AM 02/10/2022    2:53 PM  Depression screen PHQ 2/9  Decreased Interest 0 0 0  Down, Depressed, Hopeless 0 0 0  PHQ - 2 Score 0 0 0  Altered sleeping 0 0 0  Tired, decreased energy 0 0 0  Change in appetite 0 0 0  Feeling bad  or failure about yourself  0 0 0  Trouble concentrating 0 0 0  Moving slowly or fidgety/restless 0 0 0  Suicidal thoughts 0 0 0  PHQ-9 Score 0 0 0  Difficult doing work/chores Not difficult at all Not difficult at all Not difficult at all      03/19/2022   10:45 AM 02/14/2022   11:37 AM 02/10/2022    2:54 PM 09/18/2021    9:07 AM  GAD 7 : Generalized Anxiety Score  Nervous, Anxious, on Edge 0 0 0 0  Control/stop worrying 0 0 0 0  Worry too much - different things 0 0 0 0  Trouble relaxing 0 0 0 0  Restless 0 0 0 0  Easily annoyed or irritable 0 0 0 0  Afraid - awful might happen 0 0 0 0  Total GAD 7 Score 0 0 0 0  Anxiety Difficulty Not difficult at all Not difficult at all Not difficult at all Not difficult at all   Assessment/ Plan: Kristina Mclaughlin here for annual physical exam.   Annual physical exam  Osteopenia of lumbar spine - Plan: CBC, DG [facility] DEXA  Essential hypertension - Plan: CMP14+EGFR  Hypertriglyceridemia - Plan: CMP14+EGFR, Lipid Panel, TSH  Pre-diabetes - Plan: Bayer DCA Hb A1c Waived  Ear fullness, left   DEXA scan ordered.  Has history of osteopenia of the lumbar spine noted several years ago  Blood pressure well controlled.  No changes  Plan for fasting labs.  A1c ordered given history of prediabetes today  Demonstrated osteopathic maneuver for eustachian tube drainage of that left ear.  No evidence of abnormality on exam today  Counseled on healthy lifestyle choices, including diet (rich in fruits, vegetables and lean meats and low in salt and simple  carbohydrates) and exercise (at least 30 minutes of moderate physical activity daily).  Kristina Dossantos M. Kristina Ripple, DO

## 2022-03-21 ENCOUNTER — Telehealth: Payer: Self-pay | Admitting: Family Medicine

## 2022-03-21 ENCOUNTER — Ambulatory Visit (INDEPENDENT_AMBULATORY_CARE_PROVIDER_SITE_OTHER): Payer: Medicare HMO | Admitting: Family Medicine

## 2022-03-21 ENCOUNTER — Encounter: Payer: Self-pay | Admitting: Family Medicine

## 2022-03-21 DIAGNOSIS — J029 Acute pharyngitis, unspecified: Secondary | ICD-10-CM

## 2022-03-21 LAB — RAPID STREP SCREEN (MED CTR MEBANE ONLY): Strep Gp A Ag, IA W/Reflex: NEGATIVE

## 2022-03-21 LAB — CULTURE, GROUP A STREP

## 2022-03-21 NOTE — Telephone Encounter (Signed)
Pt aware of recommendation °

## 2022-03-21 NOTE — Telephone Encounter (Signed)
  Incoming Patient Call  03/21/2022  What symptoms do you have? Sore throat  How long have you been sick? During the night  Have you been seen for this problem? Was seen 6-16 with Dr. Darnell Level and discussed she had a cough at that time and now she has a sore throat. Told patient she will probably need to be seen.  If your provider decides to give you a prescription, which pharmacy would you like for it to be sent to? [facility] in [place]   Patient informed that this information will be sent to the clinical staff for review and that they should receive a follow up call.

## 2022-03-21 NOTE — Progress Notes (Signed)
Virtual Visit via telephone Note Due to COVID-19 pandemic this visit was conducted virtually. This visit type was conducted due to national recommendations for restrictions regarding the COVID-19 Pandemic (e.g. social distancing, sheltering in place) in an effort to limit this patient's exposure and mitigate transmission in our community. All issues noted in this document were discussed and addressed.  A physical exam was not performed with this format.   I connected with Kristina Mclaughlin on 03/21/2022 at 1548 by telephone and verified that I am speaking with the correct person using two identifiers. Kristina Mclaughlin is currently located at home and family is currently with them during visit. The provider, Monia Pouch, FNP is located in their office at time of visit.  I discussed the limitations, risks, security and privacy concerns of performing an evaluation and management service by virtual visit and the availability of in person appointments. I also discussed with the patient that there may be a patient responsible charge related to this service. The patient expressed understanding and agreed to proceed.  Subjective:  Patient ID: Kristina Mclaughlin, female    DOB: 1951-05-21, 71 y.o.   MRN: [id number]  Chief Complaint:  Sore Throat   HPI: Kristina Mclaughlin is a 71 y.o. female presenting on 03/21/2022 for Sore Throat   Sore Throat  This is a new problem. The current episode started today. The problem has been unchanged. Neither side of throat is experiencing more pain than the other. There has been no fever. The pain is moderate. Associated symptoms include a hoarse voice. Pertinent negatives include no abdominal pain, congestion, coughing, diarrhea, drooling, ear discharge, ear pain, headaches, plugged ear sensation, neck pain, shortness of breath, stridor, swollen glands, trouble swallowing or vomiting. She has had no exposure to strep or mono. She has tried  gargles for the symptoms. The treatment provided no relief.     Relevant past medical, surgical, family, and social history reviewed and updated as indicated.  Allergies and medications reviewed and updated.   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, lateral back   Essential hypertension 10/29/2015   Hypertension    Insomnia     Past Surgical History:  Procedure Laterality Date   ABDOMINAL HYSTERECTOMY     BACK SURGERY     BREAST SURGERY     biopsy x2; reduction   EXAMINATION UNDER ANESTHESIA  08/27/2012   LIPOMA EXCISION Right 07/03/2021   Procedure: EXCISION LIPOMA; ANKLE;  Surgeon: Virl Cagey, MD;  Location: [facility];  Service: General;  Laterality: Right;   LIPOMA EXCISION N/A 07/03/2021   Procedure: EXCISION LIPOMA; SUPRAPUBIC;  Surgeon: Virl Cagey, MD;  Location: [facility];  Service: General;  Laterality: N/A;   MASS EXCISION Left 07/03/2021   Procedure: EXCISION LIPOMA; ANKLE;  Surgeon: Virl Cagey, MD;  Location: [facility];  Service: General;  Laterality: Left;   MASS EXCISION Left 07/03/2021   Procedure: EXCISION OF LIPOMA; ARM;  Surgeon: Virl Cagey, MD;  Location: [facility];  Service: General;  Laterality: Left;   REDUCTION MAMMAPLASTY Bilateral 2002    Social History   Socioeconomic History   Marital status: Married    Spouse name: Not on file   Number of children: 2   Years of education: Not on file   Highest education level: Not on file  Occupational History   Occupation: Purchasing   Tobacco Use  Smoking status: Never   Smokeless tobacco: Never  Vaping Use   Vaping Use: Never used  Substance and Sexual Activity   Alcohol use: No    Alcohol/week: 0.0 standard drinks of alcohol   Drug use: No   Sexual activity: Not on file  Other Topics Concern   Not on file  Social History Narrative   She is a retired Paramedic.  She is married with 2  daughters and 4 grandchildren, all who live locally.  She tries to stay active.   Social Determinants of Health   Financial Resource Strain: Low Risk  (01/21/2022)   Overall Financial Resource Strain (CARDIA)    Difficulty of Paying Living Expenses: Not hard at all  Food Insecurity: No Food Insecurity (01/21/2022)   Hunger Vital Sign    Worried About Running Out of Food in the Last Year: Never true    Ran Out of Food in the Last Year: Never true  Transportation Needs: No Transportation Needs (01/21/2022)   PRAPARE - Hydrologist (Medical): No    Lack of Transportation (Non-Medical): No  Physical Activity: Sufficiently Active (01/21/2022)   Exercise Vital Sign    Days of Exercise per Week: 3 days    Minutes of Exercise per Session: 60 min  Stress: No Stress Concern Present (01/21/2022)   [place]    Feeling of Stress : Not at all  Social Connections: [place] (01/21/2022)   Social Connection and Isolation Panel [NHANES]    Frequency of Communication with Friends and Family: More than three times a week    Frequency of Social Gatherings with Friends and Family: More than three times a week    Attends Religious Services: More than 4 times per year    Active Member of [employer] or Organizations: Yes    Attends Archivist Meetings: More than 4 times per year    Marital Status: Married  Human resources officer Violence: Not At Risk (01/21/2022)   Humiliation, Afraid, Rape, and Kick questionnaire    Fear of Current or Ex-Partner: No    Emotionally Abused: No    Physically Abused: No    Sexually Abused: No    Outpatient Encounter Medications as of 03/21/2022  Medication Sig   albuterol (VENTOLIN HFA) 108 (90 Base) MCG/ACT inhaler TAKE 2 PUFFS BY MOUTH EVERY 6 HOURS AS NEEDED FOR WHEEZE OR SHORTNESS OF BREATH   amLODipine (NORVASC) 5 MG tablet TAKE 1 TABLET (5 MG TOTAL) BY MOUTH DAILY. TO  REPLACE METOPROLOL   atorvastatin (LIPITOR) 40 MG tablet Take 1 tablet (40 mg total) by mouth daily.   Azelastine HCl 137 MCG/SPRAY SOLN Place into both nostrils.   calcium carbonate (OSCAL) 1500 (600 Ca) MG TABS tablet Take 600 mg of elemental calcium by mouth daily with breakfast.   diclofenac Sodium (VOLTAREN) 1 % GEL Apply 2 g topically 4 (four) times daily.   EPINEPHrine 0.3 mg/0.3 mL IJ SOAJ injection Inject 0.3 mg into the muscle as needed for anaphylaxis.   fexofenadine (ALLEGRA) 180 MG tablet Take 1 tablet (180 mg total) by mouth daily.   fluticasone (FLONASE) 50 MCG/ACT nasal spray Place 2 sprays into both nostrils daily.   ketoconazole (NIZORAL) 2 % shampoo APPLY 1 APPLICATION TOPICALLY 2 (TWO) TIMES A WEEK.   linaCLOtide (LINZESS PO) Take by mouth.   Multiple Vitamin (MULTI-VITAMIN PO) Take 1 tablet by mouth daily.    ondansetron (ZOFRAN) 4 MG  tablet Take 1 tablet (4 mg total) by mouth every 8 (eight) hours as needed.   pantoprazole (PROTONIX) 40 MG tablet Take 1 tablet (40 mg total) by mouth 2 (two) times daily before a meal.   Polyethylene Glycol 400 (BLINK TEARS OP) Place 1 drop into both eyes 2 (two) times daily as needed (dry eyes).   predniSONE (DELTASONE) 20 MG tablet 2 po at same time daily for 3 days.  Start Saturday with breakfast   sucralfate (CARAFATE) 1 g tablet Take 1 tablet (1 g total) by mouth 4 (four) times daily -  before meals and at bedtime.   SUMAtriptan (IMITREX) 100 MG tablet TAKE 1 TABLET AS NEED FOR MIGRAINE. MAY REPEAT IN 2 HOURS IF HEADACHE PERSISTS OR RECURS.   SYMBICORT 80-4.5 MCG/ACT inhaler USE 2 INHALATIONS ORALLY   TWICE DAILY.  Patient has been using. Please fill.   traZODone (DESYREL) 150 MG tablet Take 1 tablet (150 mg total) by mouth at bedtime as needed for sleep.   trospium (SANCTURA) 20 MG tablet TAKE 1 TABLET BY MOUTH TWICE A DAY   valsartan-hydrochlorothiazide (DIOVAN-HCT) 320-25 MG tablet Take 1 tablet by mouth daily.   No  facility-administered encounter medications on file as of 03/21/2022.    Allergies  Allergen Reactions   Lovenox [Enoxaparin Sodium] Anaphylaxis   Moxifloxacin Anaphylaxis and Other (See Comments)   Quinolones Hives and Shortness Of Breath   Guaifenesin Er    Mucinex Sinus-Max [Phenylephrine-Apap-Guaifenesin] Swelling   Sulfamethoxazole-Trimethoprim Other (See Comments)    Review of Systems  Constitutional:  Negative for activity change, appetite change, chills, diaphoresis, fatigue, fever and unexpected weight change.  HENT:  Positive for hoarse voice, sore throat and voice change. Negative for congestion, dental problem, drooling, ear discharge, ear pain, facial swelling, hearing loss, mouth sores, nosebleeds, postnasal drip, rhinorrhea, sinus pressure, sinus pain, sneezing, tinnitus and trouble swallowing.   Respiratory:  Negative for cough, shortness of breath and stridor.   Gastrointestinal:  Negative for abdominal pain, diarrhea, nausea and vomiting.  Genitourinary:  Negative for decreased urine volume and difficulty urinating.  Musculoskeletal:  Negative for neck pain.  Neurological:  Negative for weakness and headaches.  Psychiatric/Behavioral:  Negative for confusion.   All other systems reviewed and are negative.        Observations/Objective: No vital signs or physical exam, this was a virtual health encounter.  Pt alert and oriented, answers all questions appropriately, and able to speak in full sentences.    Assessment and Plan: Kristina Mclaughlin was seen today for sore throat.  Diagnoses and all orders for this visit:  Sore throat Viral pharyngitis Strep negative. Symptomatic care discussed in detail. Tylenol liquid to coat throat, salt water gargles, honey with tea, biotene mouth wash, throat lozenges. Pt aware to report new, worsening, or persistent symptoms.  -     Rapid Strep Screen ([facility] ONLY) -     Culture, Group A Strep   Follow Up  Instructions: Return if symptoms worsen or fail to improve.    I discussed the assessment and treatment plan with the patient. The patient was provided an opportunity to ask questions and all were answered. The patient agreed with the plan and demonstrated an understanding of the instructions.   The patient was advised to call back or seek an in-person evaluation if the symptoms worsen or if the condition fails to improve as anticipated.  The above assessment and management plan was discussed with the patient. The patient verbalized understanding of  and has agreed to the management plan. Patient is aware to call the clinic if they develop any new symptoms or if symptoms persist or worsen. Patient is aware when to return to the clinic for a follow-up visit. Patient educated on when it is appropriate to go to the emergency department.    I provided 15 minutes of time during this telephone encounter.   Monia Pouch, FNP-C [facility] [address] [place], [place] [address] [phone] 03/21/2022

## 2022-03-21 NOTE — Telephone Encounter (Signed)
There is nothing prescription that will take care of this.  Recommend follow up with pulmonology as chronic cough is likely the problem.  In the meantime, she can use sucrettes/ chloreseptic spray for sore throat/ cough drops.

## 2022-04-02 ENCOUNTER — Ambulatory Visit (INDEPENDENT_AMBULATORY_CARE_PROVIDER_SITE_OTHER): Payer: Medicare HMO

## 2022-04-02 DIAGNOSIS — M8588 Other specified disorders of bone density and structure, other site: Secondary | ICD-10-CM | POA: Diagnosis not present

## 2022-04-02 DIAGNOSIS — Z780 Asymptomatic menopausal state: Secondary | ICD-10-CM | POA: Diagnosis not present

## 2022-04-03 DIAGNOSIS — J301 Allergic rhinitis due to pollen: Secondary | ICD-10-CM | POA: Diagnosis not present

## 2022-04-03 DIAGNOSIS — J3089 Other allergic rhinitis: Secondary | ICD-10-CM | POA: Diagnosis not present

## 2022-04-03 DIAGNOSIS — J3081 Allergic rhinitis due to animal (cat) (dog) hair and dander: Secondary | ICD-10-CM | POA: Diagnosis not present

## 2022-04-04 ENCOUNTER — Other Ambulatory Visit: Payer: Self-pay | Admitting: Family Medicine

## 2022-04-04 DIAGNOSIS — I10 Essential (primary) hypertension: Secondary | ICD-10-CM

## 2022-04-16 DIAGNOSIS — J3089 Other allergic rhinitis: Secondary | ICD-10-CM | POA: Diagnosis not present

## 2022-04-16 DIAGNOSIS — J3081 Allergic rhinitis due to animal (cat) (dog) hair and dander: Secondary | ICD-10-CM | POA: Diagnosis not present

## 2022-04-16 DIAGNOSIS — J301 Allergic rhinitis due to pollen: Secondary | ICD-10-CM | POA: Diagnosis not present

## 2022-04-18 ENCOUNTER — Other Ambulatory Visit: Payer: Self-pay | Admitting: Family Medicine

## 2022-04-18 DIAGNOSIS — N3281 Overactive bladder: Secondary | ICD-10-CM

## 2022-04-18 DIAGNOSIS — R350 Frequency of micturition: Secondary | ICD-10-CM

## 2022-04-18 DIAGNOSIS — E781 Pure hyperglyceridemia: Secondary | ICD-10-CM

## 2022-04-18 DIAGNOSIS — F5101 Primary insomnia: Secondary | ICD-10-CM

## 2022-04-25 DIAGNOSIS — J301 Allergic rhinitis due to pollen: Secondary | ICD-10-CM | POA: Diagnosis not present

## 2022-04-25 DIAGNOSIS — J3081 Allergic rhinitis due to animal (cat) (dog) hair and dander: Secondary | ICD-10-CM | POA: Diagnosis not present

## 2022-04-25 DIAGNOSIS — J3089 Other allergic rhinitis: Secondary | ICD-10-CM | POA: Diagnosis not present

## 2022-05-06 ENCOUNTER — Ambulatory Visit (INDEPENDENT_AMBULATORY_CARE_PROVIDER_SITE_OTHER): Payer: Medicare HMO | Admitting: Family Medicine

## 2022-05-06 ENCOUNTER — Encounter: Payer: Self-pay | Admitting: Family Medicine

## 2022-05-06 VITALS — BP 151/70 | HR 79 | Temp 98.8°F | Ht 69.0 in | Wt 239.0 lb

## 2022-05-06 DIAGNOSIS — M62830 Muscle spasm of back: Secondary | ICD-10-CM | POA: Diagnosis not present

## 2022-05-06 MED ORDER — CYCLOBENZAPRINE HCL 10 MG PO TABS: 10.0000 mg | ORAL_TABLET | Freq: Three times a day (TID) | ORAL | 0 refills | Status: DC | PRN

## 2022-05-06 MED ORDER — METHYLPREDNISOLONE ACETATE 40 MG/ML IJ SUSP
40.0000 mg | Freq: Once | INTRAMUSCULAR | Status: AC
  Administered 2022-05-06: 40 mg via INTRAMUSCULAR

## 2022-05-06 MED ORDER — KETOROLAC TROMETHAMINE 60 MG/2ML IM SOLN
60.0000 mg | Freq: Once | INTRAMUSCULAR | Status: AC
  Administered 2022-05-06: 60 mg via INTRAMUSCULAR

## 2022-05-06 NOTE — Progress Notes (Signed)
Subjective:  Patient ID: Kristina Mclaughlin, female    DOB: 1951/06/27, 71 y.o.   MRN: [id number]  Patient Care Team: Janora Norlander, DO as PCP - General (Family Medicine) Lavonna Monarch, MD (Inactive) as Consulting Physician (Dermatology) Tiajuana Amass, MD as Referring Physician (Allergy and Immunology) Celestia Khat, OD (Optometry)   Chief Complaint:  Back Pain   HPI: Kristina Mclaughlin is a 71 y.o. female presenting on 05/06/2022 for Back Pain   Pt states she went on a [place] bus trip to [place], returned on Friday. States she woke up Saturday with significant right lower back pain. States hurts to walk, move certain ways, and with palpation.   Back Pain This is a new problem. Episode onset: Saturday. The problem occurs constantly. The problem has been waxing and waning since onset. The pain is present in the lumbar spine. The quality of the pain is described as stabbing, shooting and cramping. The pain does not radiate. The pain is at a severity of 7/10. The pain is moderate. The symptoms are aggravated by bending, position, lying down, sitting, standing, stress and twisting. Pertinent negatives include no abdominal pain, bladder incontinence, bowel incontinence, chest pain, dysuria, fever, headaches, leg pain, numbness, paresis, paresthesias, pelvic pain, perianal numbness, tingling, weakness or weight loss. She has tried NSAIDs and heat for the symptoms. The treatment provided no relief.        Relevant past medical, surgical, family, and social history reviewed and updated as indicated.  Allergies and medications reviewed and updated. Data reviewed: Chart in Epic.   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, lateral back   Essential hypertension 10/29/2015   Hypertension    Insomnia     Past Surgical History:  Procedure Laterality Date   ABDOMINAL HYSTERECTOMY     BACK SURGERY      BREAST SURGERY     biopsy x2; reduction   EXAMINATION UNDER ANESTHESIA  08/27/2012   LIPOMA EXCISION Right 07/03/2021   Procedure: EXCISION LIPOMA; ANKLE;  Surgeon: Virl Cagey, MD;  Location: [facility];  Service: General;  Laterality: Right;   LIPOMA EXCISION N/A 07/03/2021   Procedure: EXCISION LIPOMA; SUPRAPUBIC;  Surgeon: Virl Cagey, MD;  Location: [facility];  Service: General;  Laterality: N/A;   MASS EXCISION Left 07/03/2021   Procedure: EXCISION LIPOMA; ANKLE;  Surgeon: Virl Cagey, MD;  Location: [facility];  Service: General;  Laterality: Left;   MASS EXCISION Left 07/03/2021   Procedure: EXCISION OF LIPOMA; ARM;  Surgeon: Virl Cagey, MD;  Location: [facility];  Service: General;  Laterality: Left;   REDUCTION MAMMAPLASTY Bilateral 2002    Social History   Socioeconomic History   Marital status: Married    Spouse name: Not on file   Number of children: 2   Years of education: Not on file   Highest education level: Not on file  Occupational History   Occupation: Purchasing   Tobacco Use   Smoking status: Never   Smokeless tobacco: Never  Vaping Use   Vaping Use: Never used  Substance and Sexual Activity   Alcohol use: No    Alcohol/week: 0.0 standard drinks of alcohol   Drug use: No   Sexual activity: Not on file  Other Topics Concern   Not on file  Social History Narrative   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  She tries to stay active.   Social Determinants of Health   Financial Resource Strain: Low Risk  (01/21/2022)   Overall Financial Resource Strain (CARDIA)    Difficulty of Paying Living Expenses: Not hard at all  Food Insecurity: No Food Insecurity (01/21/2022)   Hunger Vital Sign    Worried About Running Out of Food in the Last Year: Never true    Ran Out of Food in the Last Year: Never true  Transportation Needs: No Transportation Needs (01/21/2022)    PRAPARE - Hydrologist (Medical): No    Lack of Transportation (Non-Medical): No  Physical Activity: Sufficiently Active (01/21/2022)   Exercise Vital Sign    Days of Exercise per Week: 3 days    Minutes of Exercise per Session: 60 min  Stress: No Stress Concern Present (01/21/2022)   [place]    Feeling of Stress : Not at all  Social Connections: [place] (01/21/2022)   Social Connection and Isolation Panel [NHANES]    Frequency of Communication with Friends and Family: More than three times a week    Frequency of Social Gatherings with Friends and Family: More than three times a week    Attends Religious Services: More than 4 times per year    Active Member of [employer] or Organizations: Yes    Attends Archivist Meetings: More than 4 times per year    Marital Status: Married  Human resources officer Violence: Not At Risk (01/21/2022)   Humiliation, Afraid, Rape, and Kick questionnaire    Fear of Current or Ex-Partner: No    Emotionally Abused: No    Physically Abused: No    Sexually Abused: No    Outpatient Encounter Medications as of 05/06/2022  Medication Sig   albuterol (VENTOLIN HFA) 108 (90 Base) MCG/ACT inhaler TAKE 2 PUFFS BY MOUTH EVERY 6 HOURS AS NEEDED FOR WHEEZE OR SHORTNESS OF BREATH   amLODipine (NORVASC) 5 MG tablet TAKE 1 TABLET (5 MG TOTAL) BY MOUTH DAILY. TO REPLACE METOPROLOL   atorvastatin (LIPITOR) 40 MG tablet TAKE 1 TABLET BY MOUTH EVERY DAY   Azelastine HCl 137 MCG/SPRAY SOLN Place into both nostrils.   calcium carbonate (OSCAL) 1500 (600 Ca) MG TABS tablet Take 600 mg of elemental calcium by mouth daily with breakfast.   cyclobenzaprine (FLEXERIL) 10 MG tablet Take 1 tablet (10 mg total) by mouth 3 (three) times daily as needed for muscle spasms.   diclofenac Sodium (VOLTAREN) 1 % GEL Apply 2 g topically 4 (four) times daily.   EPINEPHrine 0.3 mg/0.3 mL  IJ SOAJ injection Inject 0.3 mg into the muscle as needed for anaphylaxis.   fexofenadine (ALLEGRA) 180 MG tablet Take 1 tablet (180 mg total) by mouth daily.   fluticasone (FLONASE) 50 MCG/ACT nasal spray Place 2 sprays into both nostrils daily.   ketoconazole (NIZORAL) 2 % shampoo APPLY 1 APPLICATION TOPICALLY 2 (TWO) TIMES A WEEK.   linaCLOtide (LINZESS PO) Take by mouth.   Multiple Vitamin (MULTI-VITAMIN PO) Take 1 tablet by mouth daily.    ondansetron (ZOFRAN) 4 MG tablet Take 1 tablet (4 mg total) by mouth every 8 (eight) hours as needed.   pantoprazole (PROTONIX) 40 MG tablet Take 1 tablet (40 mg total) by mouth 2 (two) times daily before a meal.   Polyethylene Glycol 400 (BLINK TEARS OP) Place 1 drop into both eyes 2 (two)  times daily as needed (dry eyes).   sucralfate (CARAFATE) 1 g tablet Take 1 tablet (1 g total) by mouth 4 (four) times daily -  before meals and at bedtime.   SUMAtriptan (IMITREX) 100 MG tablet TAKE 1 TABLET AS NEED FOR MIGRAINE. MAY REPEAT IN 2 HOURS IF HEADACHE PERSISTS OR RECURS.   SYMBICORT 80-4.5 MCG/ACT inhaler USE 2 INHALATIONS ORALLY   TWICE DAILY.  Patient has been using. Please fill.   traZODone (DESYREL) 150 MG tablet TAKE 1 TABLET (150 MG TOTAL) BY MOUTH AT BEDTIME AS NEEDED FOR SLEEP.   trospium (SANCTURA) 20 MG tablet TAKE 1 TABLET BY MOUTH TWICE A DAY   valsartan-hydrochlorothiazide (DIOVAN-HCT) 320-25 MG tablet TAKE 1 TABLET BY MOUTH EVERY DAY   [DISCONTINUED] predniSONE (DELTASONE) 20 MG tablet 2 po at same time daily for 3 days.  Start Saturday with breakfast   [EXPIRED] ketorolac (TORADOL) injection 60 mg    [EXPIRED] methylPREDNISolone acetate (DEPO-MEDROL) injection 40 mg    No facility-administered encounter medications on file as of 05/06/2022.    Allergies  Allergen Reactions   Lovenox [Enoxaparin Sodium] Anaphylaxis   Moxifloxacin Anaphylaxis and Other (See Comments)   Quinolones Hives and Shortness Of Breath   Guaifenesin Er    Mucinex  Sinus-Max [Phenylephrine-Apap-Guaifenesin] Swelling   Sulfamethoxazole-Trimethoprim Other (See Comments)    Review of Systems  Constitutional:  Negative for activity change, appetite change, chills, diaphoresis, fatigue, fever, unexpected weight change and weight loss.  HENT: Negative.    Eyes: Negative.  Negative for photophobia and visual disturbance.  Respiratory:  Negative for cough, chest tightness and shortness of breath.   Cardiovascular:  Negative for chest pain, palpitations and leg swelling.  Gastrointestinal:  Negative for abdominal pain, blood in stool, bowel incontinence, constipation, diarrhea, nausea and vomiting.  Endocrine: Negative.   Genitourinary:  Negative for bladder incontinence, decreased urine volume, difficulty urinating, dysuria, frequency, pelvic pain and urgency.  Musculoskeletal:  Positive for arthralgias, back pain, gait problem and myalgias. Negative for joint swelling and neck pain.  Skin: Negative.   Allergic/Immunologic: Negative.   Neurological:  Negative for dizziness, tingling, weakness, numbness, headaches and paresthesias.  Hematological: Negative.   Psychiatric/Behavioral:  Negative for confusion, hallucinations, sleep disturbance and suicidal ideas.   All other systems reviewed and are negative.       Objective:  BP (!) 151/70   Pulse 79   Temp 98.8 F (37.1 C)   Ht '5\' 9"'$  (1.753 m)   Wt 239 lb (108.4 kg)   SpO2 93%   BMI 35.29 kg/m    Wt Readings from Last 3 Encounters:  05/06/22 239 lb (108.4 kg)  03/19/22 238 lb 3.2 oz (108 kg)  02/14/22 243 lb (110.2 kg)    Physical Exam Vitals and nursing note reviewed.  Constitutional:      General: She is in acute distress (appears uncomfortable).     Appearance: She is obese. She is not ill-appearing, toxic-appearing or diaphoretic.  HENT:     Head: Normocephalic and atraumatic.     Mouth/Throat:     Mouth: Mucous membranes are moist.  Eyes:     Conjunctiva/sclera: Conjunctivae  normal.     Pupils: Pupils are equal, round, and reactive to light.  Cardiovascular:     Rate and Rhythm: Normal rate and regular rhythm.     Heart sounds: Normal heart sounds.  Pulmonary:     Effort: Pulmonary effort is normal.     Breath sounds: Normal breath sounds.  Musculoskeletal:  Thoracic back: Normal.     Lumbar back: Spasms and tenderness present. No swelling, edema, deformity, signs of trauma, lacerations or bony tenderness. Normal range of motion. Negative right straight leg raise test and negative left straight leg raise test. No scoliosis.       Back:     Right hip: Normal.     Left hip: Normal.  Skin:    General: Skin is warm and dry.     Capillary Refill: Capillary refill takes less than 2 seconds.  Neurological:     General: No focal deficit present.     Mental Status: She is alert and oriented to person, place, and time.     Gait: Gait abnormal (antalgic).  Psychiatric:        Mood and Affect: Mood normal.        Behavior: Behavior normal.        Thought Content: Thought content normal.        Judgment: Judgment normal.     Results for orders placed or performed in visit on 03/21/22  Rapid Strep Screen ([facility] ONLY)   Specimen: Other   Other  Result Value Ref Range   Strep Gp A Ag, IA W/Reflex Negative Negative  Culture, Group A Strep   Other  Result Value Ref Range   Strep A Culture CANCELED        Pertinent labs & imaging results that were available during my care of the patient were reviewed by me and considered in my medical decision making.  Assessment & Plan:  Kamyia was seen today for back pain.  Diagnoses and all orders for this visit:  Lumbar paraspinal muscle spasm Significant spasm of muscle noted. Will burst with steroids and toradol in office, muscle relaxant as prescribed with sedation precautions. Symptomatic care discussed in detail. Pt aware to report new, worsening, or persistent symptoms.  -     cyclobenzaprine  (FLEXERIL) 10 MG tablet; Take 1 tablet (10 mg total) by mouth 3 (three) times daily as needed for muscle spasms. -     ketorolac (TORADOL) injection 60 mg -     methylPREDNISolone acetate (DEPO-MEDROL) injection 40 mg     Continue all other maintenance medications.  Follow up plan: Return in about 4 weeks (around 06/03/2022), or if symptoms worsen or fail to improve.   Continue healthy lifestyle choices, including diet (rich in fruits, vegetables, and lean proteins, and low in salt and simple carbohydrates) and exercise (at least 30 minutes of moderate physical activity daily).  Educational handout given for muscle spasm  The above assessment and management plan was discussed with the patient. The patient verbalized understanding of and has agreed to the management plan. Patient is aware to call the clinic if they develop any new symptoms or if symptoms persist or worsen. Patient is aware when to return to the clinic for a follow-up visit. Patient educated on when it is appropriate to go to the emergency department.   Monia Pouch, FNP-C [facility] [phone]

## 2022-05-07 DIAGNOSIS — J3089 Other allergic rhinitis: Secondary | ICD-10-CM | POA: Diagnosis not present

## 2022-05-07 DIAGNOSIS — J3081 Allergic rhinitis due to animal (cat) (dog) hair and dander: Secondary | ICD-10-CM | POA: Diagnosis not present

## 2022-05-07 DIAGNOSIS — J301 Allergic rhinitis due to pollen: Secondary | ICD-10-CM | POA: Diagnosis not present

## 2022-05-09 ENCOUNTER — Other Ambulatory Visit: Payer: Self-pay | Admitting: Family Medicine

## 2022-05-09 DIAGNOSIS — J309 Allergic rhinitis, unspecified: Secondary | ICD-10-CM

## 2022-05-15 DIAGNOSIS — M5411 Radiculopathy, occipito-atlanto-axial region: Secondary | ICD-10-CM | POA: Diagnosis not present

## 2022-05-15 DIAGNOSIS — M9903 Segmental and somatic dysfunction of lumbar region: Secondary | ICD-10-CM | POA: Diagnosis not present

## 2022-05-15 DIAGNOSIS — J301 Allergic rhinitis due to pollen: Secondary | ICD-10-CM | POA: Diagnosis not present

## 2022-05-15 DIAGNOSIS — J3089 Other allergic rhinitis: Secondary | ICD-10-CM | POA: Diagnosis not present

## 2022-05-15 DIAGNOSIS — M9901 Segmental and somatic dysfunction of cervical region: Secondary | ICD-10-CM | POA: Diagnosis not present

## 2022-05-15 DIAGNOSIS — M9902 Segmental and somatic dysfunction of thoracic region: Secondary | ICD-10-CM | POA: Diagnosis not present

## 2022-05-15 DIAGNOSIS — M5136 Other intervertebral disc degeneration, lumbar region: Secondary | ICD-10-CM | POA: Diagnosis not present

## 2022-05-15 DIAGNOSIS — M5431 Sciatica, right side: Secondary | ICD-10-CM | POA: Diagnosis not present

## 2022-05-15 DIAGNOSIS — J3081 Allergic rhinitis due to animal (cat) (dog) hair and dander: Secondary | ICD-10-CM | POA: Diagnosis not present

## 2022-05-19 ENCOUNTER — Telehealth: Payer: Self-pay | Admitting: Family Medicine

## 2022-05-19 ENCOUNTER — Encounter: Payer: Self-pay | Admitting: Family Medicine

## 2022-05-19 ENCOUNTER — Ambulatory Visit (INDEPENDENT_AMBULATORY_CARE_PROVIDER_SITE_OTHER): Payer: Medicare HMO | Admitting: Family Medicine

## 2022-05-19 VITALS — BP 145/77 | HR 87 | Temp 99.8°F | Ht 69.0 in | Wt 237.8 lb

## 2022-05-19 DIAGNOSIS — R053 Chronic cough: Secondary | ICD-10-CM

## 2022-05-19 DIAGNOSIS — J208 Acute bronchitis due to other specified organisms: Secondary | ICD-10-CM

## 2022-05-19 DIAGNOSIS — B9689 Other specified bacterial agents as the cause of diseases classified elsewhere: Secondary | ICD-10-CM

## 2022-05-19 DIAGNOSIS — J069 Acute upper respiratory infection, unspecified: Secondary | ICD-10-CM | POA: Diagnosis not present

## 2022-05-19 LAB — RSV AG, IMMUNOCHR, WAIVED: RSV Ag, Immunochr, Waived: NEGATIVE

## 2022-05-19 LAB — VERITOR FLU A/B WAIVED
Influenza A: NEGATIVE
Influenza B: NEGATIVE

## 2022-05-19 MED ORDER — METHYLPREDNISOLONE ACETATE 40 MG/ML IJ SUSP
40.0000 mg | Freq: Once | INTRAMUSCULAR | Status: AC
  Administered 2022-05-19: 40 mg via INTRAMUSCULAR

## 2022-05-19 MED ORDER — IPRATROPIUM-ALBUTEROL 0.5-2.5 (3) MG/3ML IN SOLN
3.0000 mL | Freq: Once | RESPIRATORY_TRACT | Status: AC
  Administered 2022-05-19: 3 mL via RESPIRATORY_TRACT

## 2022-05-19 MED ORDER — PROMETHAZINE-DM 6.25-15 MG/5ML PO SYRP: 2.5000 mL | ORAL_SOLUTION | Freq: Four times a day (QID) | ORAL | 0 refills | Status: DC | PRN

## 2022-05-19 MED ORDER — CEFDINIR 300 MG PO CAPS: 300.0000 mg | ORAL_CAPSULE | Freq: Two times a day (BID) | ORAL | 0 refills | Status: DC

## 2022-05-19 NOTE — Telephone Encounter (Signed)
Pt aware of referral being placed and waiting for scheduling

## 2022-05-19 NOTE — Patient Instructions (Signed)
  Acute Bronchitis, Adult  Acute bronchitis is when air tubes in the lungs (bronchi) suddenly get swollen. The condition can make it hard for you to breathe. In adults, acute bronchitis usually goes away within 2 weeks. A cough caused by bronchitis may last up to 3 weeks. Smoking, allergies, and asthma can make the condition worse. What are the causes? Germs that cause cold and flu (viruses). The most common cause of this condition is the virus that causes the common cold. Bacteria. Substances that bother (irritate) the lungs, including: Smoke from cigarettes and other types of tobacco. Dust and pollen. Fumes from chemicals, gases, or burned fuel. Indoor or outdoor air pollution. What increases the risk? A weak body's defense system. This is also called the immune system. Any condition that affects your lungs and breathing, such as asthma. What are the signs or symptoms? A cough. Coughing up clear, yellow, or green mucus. Making high-pitched whistling sounds when you breathe, most often when you breathe out (wheezing). Runny or stuffy nose. Having too much mucus in your lungs (chest congestion). Shortness of breath. Body aches. A sore throat. How is this treated? Acute bronchitis may go away over time without treatment. Your doctor may tell you to: Drink more fluids. This will help thin your mucus so it is easier to cough up. Use a device that gets medicine into your lungs (inhaler). Use a vaporizer or a humidifier. These are machines that add water to the air. This helps with coughing and poor breathing. Take a medicine that thins mucus and helps clear it from your lungs. Take a medicine that prevents or stops coughing. It is not common to take an antibiotic medicine for this condition. Follow these instructions at home:  Take over-the-counter and prescription medicines only as told by your doctor. Use an inhaler, vaporizer, or humidifier as told by your doctor. Take two  teaspoons (10 mL) of honey at bedtime. This helps lessen your coughing at night. Drink enough fluid to keep your pee (urine) pale yellow. Do not smoke or use any products that contain nicotine or tobacco. If you need help quitting, ask your doctor. Get a lot of rest. Return to your normal activities when your doctor says that it is safe. Keep all follow-up visits. How is this prevented?  Wash your hands often with soap and water for at least 20 seconds. If you cannot use soap and water, use hand sanitizer. Avoid contact with people who have cold symptoms. Try not to touch your mouth, nose, or eyes with your hands. Avoid breathing in smoke or chemical fumes. Make sure to get the flu shot every year. Contact a doctor if: Your symptoms do not get better in 2 weeks. You have trouble coughing up the mucus. Your cough keeps you awake at night. You have a fever. Get help right away if: You cough up blood. You have chest pain. You have very bad shortness of breath. You faint or keep feeling like you are going to faint. You have a very bad headache. Your fever or chills get worse. These symptoms may be an emergency. Get help right away. Call your local emergency services (911 in the [place] Do not wait to see if the symptoms will go away. Do not drive yourself to the hospital. Summary Acute bronchitis is when air tubes in the lungs (bronchi) suddenly get swollen. In adults, acute bronchitis usually goes away within 2 weeks. Drink more fluids. This will help thin your mucus so it   is easier to cough up. Take over-the-counter and prescription medicines only as told by your doctor. Contact a doctor if your symptoms do not improve after 2 weeks of treatment. This information is not intended to replace advice given to you by your health care provider. Make sure you discuss any questions you have with your health care provider. Document Revised: 11/21/2020 Document Reviewed: 11/21/2020 Elsevier  Patient Education  2023 Elsevier Inc.  

## 2022-05-19 NOTE — Progress Notes (Signed)
Subjective: CC:URI PCP: Janora Norlander, DO AYT:KZSWFUX Kristina Mclaughlin is a 71 y.o. female presenting to clinic today for:  1. URI Patient reports that her typical cough has progressed over the last week and a half where she has a persistent harsh cough.  She is now having sputum that is green.  She has been utilizing NyQuil, DayQuil, Mucinex and Tylenol with no relief.  She utilizes her albuterol inhaler and Symbicort as directed.  She notes chills.  No known sick contacts.  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 TO REPLACE METOPROLOL, Disp: 90 tablet, Rfl: 0   atorvastatin (LIPITOR) 40 MG tablet, TAKE 1 TABLET BY MOUTH EVERY DAY, Disp: 90 tablet, Rfl: 3   Azelastine HCl 137 MCG/SPRAY SOLN, Place into both nostrils., Disp: , Rfl:    calcium carbonate (OSCAL) 1500 (600 Ca) MG TABS tablet, Take 600 mg of elemental calcium by mouth daily with breakfast., Disp: , Rfl:    cyclobenzaprine (FLEXERIL) 10 MG tablet, Take 1 tablet (10 mg total) by mouth 3 (three) times daily as needed for muscle spasms., Disp: 30 tablet, Rfl: 0   diclofenac Sodium (VOLTAREN) 1 % GEL, Apply 2 g topically 4 (four) times  daily., Disp: 50 g, Rfl: 1   EPINEPHrine 0.3 mg/0.3 mL IJ SOAJ injection, Inject 0.3 mg into the muscle as needed for anaphylaxis., Disp: , Rfl:    fexofenadine (ALLEGRA) 180 MG tablet, Take 1 tablet (180 mg total) by mouth daily., Disp: 90 tablet, Rfl: 3   fluticasone (FLONASE) 50 MCG/ACT nasal spray, SPRAY 2 SPRAYS INTO EACH NOSTRIL EVERY DAY, Disp: 48 mL, Rfl: 1   ketoconazole (NIZORAL) 2 % shampoo, APPLY 1 APPLICATION TOPICALLY 2 (TWO) TIMES A WEEK., Disp: 240 mL, Rfl: 0   linaCLOtide (LINZESS PO), Take by mouth., Disp: , Rfl:    Multiple Vitamin (MULTI-VITAMIN PO), Take 1 tablet by mouth daily. , Disp: , Rfl:    ondansetron (ZOFRAN) 4 MG tablet, Take 1 tablet (4 mg total) by mouth every 8 (eight) hours as needed., Disp: 30 tablet, Rfl: 1   pantoprazole (PROTONIX) 40 MG tablet, Take 1 tablet (40 mg total) by mouth 2 (two) times daily before a meal., Disp: 180 tablet, Rfl: 3   Polyethylene Glycol 400 (BLINK TEARS OP), Place 1 drop into both eyes 2 (two) times daily as needed (dry eyes)., Disp: , Rfl:    sucralfate (CARAFATE) 1 g tablet, Take 1 tablet (1 g total) by mouth 4 (four) times daily -  before meals and at bedtime., Disp: 360 tablet, Rfl: 2   SUMAtriptan (IMITREX) 100 MG tablet, TAKE 1 TABLET AS NEED FOR MIGRAINE. MAY REPEAT IN  2 HOURS IF HEADACHE PERSISTS OR RECURS., Disp: 8 tablet, Rfl: prn   SYMBICORT 80-4.5 MCG/ACT inhaler, USE 2 INHALATIONS ORALLY   TWICE DAILY.  Patient has been using. Please fill., Disp: 10.2 g, Rfl: 1   traZODone (DESYREL) 150 MG tablet, TAKE 1 TABLET (150 MG TOTAL) BY MOUTH AT BEDTIME AS NEEDED FOR SLEEP., Disp: 90 tablet, Rfl: 3   trospium (SANCTURA) 20 MG tablet, TAKE 1 TABLET BY MOUTH TWICE A DAY, Disp: 180 tablet, Rfl: 0   valsartan-hydrochlorothiazide (DIOVAN-HCT) 320-25 MG tablet, TAKE 1 TABLET BY MOUTH EVERY DAY, Disp: 90 tablet, Rfl: 1 Social History   Socioeconomic History   Marital status: Married    Spouse name: Not on file   Number of children: 2    Years of education: Not on file   Highest education level: Not on file  Occupational History   Occupation: Purchasing   Tobacco Use   Smoking status: Never   Smokeless tobacco: Never  Vaping Use   Vaping Use: Never used  Substance and Sexual Activity   Alcohol use: No    Alcohol/week: 0.0 standard drinks of alcohol   Drug use: No   Sexual activity: Not on file  Other Topics Concern   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  She tries to stay active.   Social Determinants of Health   Financial Resource Strain: Low Risk  (01/21/2022)   Overall Financial Resource Strain (CARDIA)    Difficulty of Paying Living Expenses: Not hard at all  Food Insecurity: No Food Insecurity (01/21/2022)   Hunger Vital Sign    Worried About Running Out of Food in the Last Year: Never true    Ran Out of Food in the Last Year: Never true  Transportation Needs: No Transportation Needs (01/21/2022)   PRAPARE - Hydrologist (Medical): No    Lack of Transportation (Non-Medical): No  Physical Activity: Sufficiently Active (01/21/2022)   Exercise Vital Sign    Days of Exercise per Week: 3 days    Minutes of Exercise per Session: 60 min  Stress: No Stress Concern Present (01/21/2022)   [place]    Feeling of Stress : Not at all  Social Connections: [place] (01/21/2022)   Social Connection and Isolation Panel [NHANES]    Frequency of Communication with Friends and Family: More than three times a week    Frequency of Social Gatherings with Friends and Family: More than three times a week    Attends Religious Services: More than 4 times per year    Active Member of [employer] or Organizations: Yes    Attends Music therapist: More than 4 times per year    Marital Status:  Married  Human resources officer Violence: Not At Risk (01/21/2022)   Humiliation, Afraid, Rape, and Kick questionnaire    Fear of Current or Ex-Partner: No    Emotionally Abused: No    Physically Abused: No    Sexually Abused: No   Family History  Problem Relation Age of Onset   Bladder Cancer Mother    Allergies Mother    Heart disease Mother    Allergies Sister    Alzheimer's disease Father    Breast cancer Neg Hx     Objective: Office vital signs reviewed. BP (!) 145/77  Pulse 87   Temp 99.8 F (37.7 C)   Ht '5\' 9"'$  (1.753 m)   Wt 237 lb 12.8 oz (107.9 kg)   BMI 35.12 kg/m   Physical Examination:  General: Awake, alert, nontoxic female, No acute distress HEENT: Normal    Neck: No masses palpated. No lymphadenopathy    Ears: Tympanic membranes intact, normal light reflex, no erythema, no bulging    Eyes: PERRLA, extraocular membranes intact, sclera white    Nose: nasal turbinates moist, clear nasal discharge    Throat: moist mucus membranes, no erythema, no tonsillar exudate.  Airway is patent Cardio: regular rate and rhythm, S1S2 heard, no murmurs appreciated Pulm: Reduced air movement in bilateral bases prior to DuoNeb.  She is coughing harshly and losing her breath during coughing spells  Assessment/ Plan: 71 y.o. female   Acute bacterial bronchitis - Plan: Novel Coronavirus, NAA ([facility]), Veritor Flu A/B Waived, methylPREDNISolone acetate (DEPO-MEDROL) injection 40 mg, cefdinir (OMNICEF) 300 MG capsule, promethazine-dextromethorphan (PROMETHAZINE-DM) 6.25-15 MG/5ML syrup, RSV Ag, Immunochr, Waived, ipratropium-albuterol (DUONEB) 0.5-2.5 (3) MG/3ML nebulizer solution 3 mL, CANCELED: RSV Ag, EIA  Chronic cough - Plan: Ambulatory referral to Pulmonology, RSV Ag, Immunochr, Waived, ipratropium-albuterol (DUONEB) 0.5-2.5 (3) MG/3ML nebulizer solution 3 mL  Going to empirically treat her as acute bacterial bronchitis given progressive nature of symptoms.  COVID test pending at  time of discharge but rapid flu, RSV negative.  Omnicef prescribed.  Cough syrup.  Given DuoNeb here in office.  New referral placed to pulmonology for reevaluation of this chronic cough.  It sounds like it was worked up previously but no true diagnosis could be found.  Orders Placed This Encounter  Procedures   Novel Coronavirus, NAA ([facility])    Order Specific Question:   Previously tested for COVID-19    Answer:   Yes    Order Specific Question:   Resident in a congregate (group) care setting    Answer:   No    Order Specific Question:   Is the patient student?    Answer:   No    Order Specific Question:   Employed in healthcare setting    Answer:   No    Order Specific Question:   Pregnant    Answer:   No    Order Specific Question:   Has patient completed COVID vaccination(s) (2 doses of Pfizer/Moderna 1 dose of [employer])    Answer:   Yes    Order Specific Question:   Has patient completed COVID Booster / 3rd dose    Answer:   No   RSV Ag, EIA   Veritor Flu A/B Waived    Order Specific Question:   Source    Answer:   nasal   No orders of the defined types were placed in this encounter.    Janora Norlander, DO [place] [phone]

## 2022-05-19 NOTE — Telephone Encounter (Signed)
Pt called requesting to speak directly with PCP nurse.

## 2022-05-20 ENCOUNTER — Telehealth: Payer: Self-pay

## 2022-05-20 LAB — NOVEL CORONAVIRUS, NAA: SARS-CoV-2, NAA: NOT DETECTED

## 2022-05-20 NOTE — Telephone Encounter (Signed)
See if she can come for cxr.

## 2022-05-20 NOTE — Telephone Encounter (Signed)
Pt states they did a chest xray- ron brown chiropractor last week

## 2022-05-20 NOTE — Telephone Encounter (Signed)
PT STILL COMPLAINING OF COUGH AND SAYS IT SEEMS TO BE GETTING WORSE  [place]- CAN YOU LOOK AT PULMONOLOGY REFERRAL?

## 2022-05-23 ENCOUNTER — Ambulatory Visit: Payer: Medicare HMO | Admitting: Internal Medicine

## 2022-05-23 ENCOUNTER — Institutional Professional Consult (permissible substitution): Payer: Medicare HMO | Admitting: Internal Medicine

## 2022-05-23 ENCOUNTER — Encounter: Payer: Self-pay | Admitting: Internal Medicine

## 2022-05-23 DIAGNOSIS — J45991 Cough variant asthma: Secondary | ICD-10-CM

## 2022-05-23 DIAGNOSIS — R058 Other specified cough: Secondary | ICD-10-CM | POA: Diagnosis not present

## 2022-05-23 DIAGNOSIS — B9689 Other specified bacterial agents as the cause of diseases classified elsewhere: Secondary | ICD-10-CM | POA: Diagnosis not present

## 2022-05-23 DIAGNOSIS — J208 Acute bronchitis due to other specified organisms: Secondary | ICD-10-CM

## 2022-05-23 LAB — NITRIC OXIDE: Nitric Oxide: 40

## 2022-05-23 MED ORDER — PROMETHAZINE-DM 6.25-15 MG/5ML PO SYRP: 2.5000 mL | ORAL_SOLUTION | Freq: Four times a day (QID) | ORAL | 0 refills | Status: DC | PRN

## 2022-05-23 MED ORDER — GABAPENTIN 100 MG PO CAPS: 100.0000 mg | ORAL_CAPSULE | Freq: Four times a day (QID) | ORAL | 2 refills | Status: DC

## 2022-05-23 MED ORDER — SYMBICORT 80-4.5 MCG/ACT IN AERO: INHALATION_SPRAY | RESPIRATORY_TRACT | 1 refills | Status: DC

## 2022-05-23 MED ORDER — CEFDINIR 300 MG PO CAPS: 300.0000 mg | ORAL_CAPSULE | Freq: Two times a day (BID) | ORAL | 0 refills | Status: DC

## 2022-05-23 MED ORDER — METHYLPREDNISOLONE ACETATE 80 MG/ML IJ SUSP
80.0000 mg | Freq: Once | INTRAMUSCULAR | Status: AC
  Administered 2022-05-23: 80 mg via INTRAMUSCULAR

## 2022-05-23 NOTE — Assessment & Plan Note (Addendum)
Onset 2015 on a background of allergic rhinitis onset in her  20's -Allergy shots per [name] since ? 2016  - Hiatal hernia small 10/2014 and much larger by 02/14/22  - sinus CT 11/06/2014 >>>  Acute/ chronic sinusitis > augmentin x 20 days> f/u [facility] then return to pulmonary when [facility] eval complete> rx gabapentin 100% elimination of cough while on it, relapsed shortly p off but never back to [place] - restarted gabapenitn titrate to max of 300 mg qid or whatever dose controls cough     Upper airway cough syndrome (previously labeled PNDS),  is so named because it's frequently impossible to sort out how much is  CR/sinusitis with freq throat clearing (which can be related to primary GERD)   vs  causing  secondary (" extra esophageal")  GERD from wide swings in gastric pressure that occur with throat clearing, often  promoting self use of mint and menthol lozenges that reduce the lower esophageal sphincter tone and exacerbate the problem further in a cyclical fashion.   These are the same pts (now being labeled as having "irritable larynx syndrome" by some cough centers) who not infrequently have a history of having failed to tolerate ace inhibitors,  dry powder inhalers or biphosphonates or report having atypical/extraesophageal reflux symptoms that don't respond to standard doses of PPI  and are easily confused as having aecopd or asthma flares by even experienced allergists/ pulmonologists (myself included).   rx as above and f/u in 4 weeks/ also rx for possible cough variant asthma but continue to use milder strength of ICS / laba

## 2022-05-23 NOTE — Progress Notes (Signed)
Subjective:    Patient ID: Kristina Mclaughlin, female    DOB: 06-25-1951,    MRN: [id number]    Brief patient profile:  21 yowf light smoker quit early 20's with onset also about the same time of rhinitis sping > fall rhinitis freq ov's never prednisone / mostly antihistamines some better over the years but tendency to bad colds not related to seasons rx with abx / cough med then better within a couple of weeks to a month before better for a year but starting Nov 2015 onset of head cold while on vacation in [place]  And coughing ever since so referred by Dr Morrie Sheldon to pulmonary office 10/20/14   Allergy shots per [name] since ? 2016    History of Present Illness  10/20/2014 1st [facility] office visit/ Berel Najjar   Chief Complaint  Patient presents with   Pulmonary Consult    Referred by Dr. Laurance Flatten. Pt c/o cough on and off since Nov 2015. She states cough is worse at night and is occ prod with minimal clear sputum. Sometimes feels as if she can not take in a good, deep breath.   cough worse p supper also can't lie down due to cough and has lots of am drainage but nothing purulent Has had reflux for years maintained on ppi  rec Please see patient coordinator before you leave today  to schedule sinus CT and we will call you with results  First take delsym two tsp every 12 hours and supplement if needed with  Percocet  up to 2 every 4 hours to suppress the urge to cough at all or even clear your throat. Swallowing water or using ice chips/non mint and menthol containing candies (such as lifesavers or sugarless jolly ranchers) are also effective.  You should rest your voice and avoid activities that you know make you cough. Once you have eliminated the cough for 3 straight days try reducing the percocet   then the delsym as tolerated.   Prednisone  Taper off as you plan Protonix (pantoprazole) Take 30-60 min before first meal of the day and take prilosec 30 min before supper and zantac 150 one  bedtime plus chlorpheniramine 4 mg x 2 at bedtime (both available over the counter)  until cough is completely gone for at least a week without the need for cough suppression GERD diet  - sinus CT 11/06/2014 >>>  Acute/ chronic sinusitis > augmentin x 20 days     11/17/2014 f/u ov/Dia Donate re: chronic cough "I've made 10 ov's  and no one has helped me" Chief Complaint  Patient presents with   Acute Visit    Pt states having increased cough for the past wk. In the am she is coughing up minimal green sputum.   cough never resolved but did not use percocet consistently yet cough was some better until 4/11 then 4/12 while on still on augmentin flared again same pattern as before with barking harsh quality 24/7 Rec zpak Prednisone 10 mg take  4 each am x 2 days,   2 each am x 2 days,  1 each am x 2 days and stop Take delsym (or mucinex dm 600 mg)  two tsp every 12 hours and supplement if needed with  Percocet  up to 2 every 4 hours  Once you have eliminated the cough for 3 straight days try reducing the percocet first,  then the delsym as tolerated.   Use flutter valve whenever you feel need  to cough  Continue  Pantoprazole (protonix) 40 mg   Take 30-60 min before first meal of the day and zantac 150  one bedtime until return to office Keep appt with [facility] and return here with all meds in hand if not satisfied after you finish with Dr Benjamine Mola > rec gabapentin resolved but recurred off it       05/23/2022  f/u ov/Shayon Trompeter re:  cough maint on symbicort 80/max gerd rx, no gabapentin   Chief Complaint  Patient presents with   Pulmonary Consult    Referred by Ronnie Doss, DO.  Pt seen here previously for cough in 2016. She states cough is still an issue- it's bothered her off and on all year. She is coughing until she's gagging. Cough is esp worse at night. She is coughing up green sputum.    Dyspnea:  fine unless coughing  Cough: late evening and noct  and sporadic during the day and turned green last  few weeks severe to point of gagging  Sleeping: on side  30-45 degrees  SABA use: 3 x times  02: none  Covid status:   vax x 2,  infected x 3      No obvious day to day or daytime variability or assoc   mucus plugs or hemoptysis or cp or chest tightness, subjective wheeze or overt sinus or hb symptoms.     Also denies any obvious fluctuation of symptoms with weather or environmental changes or other aggravating or alleviating factors except as outlined above   No unusual exposure hx or h/o childhood pna/ asthma or knowledge of premature birth.  Current Allergies, Complete Past Medical History, Past Surgical History, Family History, and Social History were reviewed in [employer] record.  ROS  The following are not active complaints unless bolded Hoarseness, sore throat, dysphagia, dental problems, itching, sneezing,  nasal congestion or discharge of excess mucus or purulent secretions, ear ache,   fever, chills, sweats, unintended wt loss or wt gain, classically pleuritic or exertional cp,  orthopnea pnd or arm/hand swelling  or leg swelling, presyncope, palpitations, abdominal pain, anorexia, nausea, vomiting, diarrhea  or change in bowel habits or change in bladder habits, change in stools or change in urine, dysuria, hematuria,  rash, arthralgias, visual complaints, headache, numbness, weakness or ataxia or problems with walking or coordination,  change in mood or  memory.        Current Meds  Medication Sig   albuterol (VENTOLIN HFA) 108 (90 Base) MCG/ACT inhaler TAKE 2 PUFFS BY MOUTH EVERY 6 HOURS AS NEEDED FOR WHEEZE OR SHORTNESS OF BREATH   amLODipine (NORVASC) 5 MG tablet TAKE 1 TABLET (5 MG TOTAL) BY MOUTH DAILY. TO REPLACE METOPROLOL   atorvastatin (LIPITOR) 40 MG tablet TAKE 1 TABLET BY MOUTH EVERY DAY   Azelastine HCl 137 MCG/SPRAY SOLN Place into both nostrils.   calcium carbonate (OSCAL) 1500 (600 Ca) MG TABS tablet Take 600 mg of elemental calcium by  mouth daily with breakfast.   cefdinir (OMNICEF) 300 MG capsule Take 1 capsule (300 mg total) by mouth 2 (two) times daily. 1 po BID   cyclobenzaprine (FLEXERIL) 10 MG tablet Take 1 tablet (10 mg total) by mouth 3 (three) times daily as needed for muscle spasms.   diclofenac Sodium (VOLTAREN) 1 % GEL Apply 2 g topically 4 (four) times daily.   EPINEPHrine 0.3 mg/0.3 mL IJ SOAJ injection Inject 0.3 mg into the muscle as needed for anaphylaxis.   fexofenadine (ALLEGRA)  180 MG tablet Take 1 tablet (180 mg total) by mouth daily.   fluticasone (FLONASE) 50 MCG/ACT nasal spray SPRAY 2 SPRAYS INTO EACH NOSTRIL EVERY DAY   ketoconazole (NIZORAL) 2 % shampoo APPLY 1 APPLICATION TOPICALLY 2 (TWO) TIMES A WEEK.   linaCLOtide (LINZESS PO) Take by mouth.   Multiple Vitamin (MULTI-VITAMIN PO) Take 1 tablet by mouth daily.    ondansetron (ZOFRAN) 4 MG tablet Take 1 tablet (4 mg total) by mouth every 8 (eight) hours as needed.   pantoprazole (PROTONIX) 40 MG tablet Take 1 tablet (40 mg total) by mouth 2 (two) times daily before a meal.   Polyethylene Glycol 400 (BLINK TEARS OP) Place 1 drop into both eyes 2 (two) times daily as needed (dry eyes).   promethazine-dextromethorphan (PROMETHAZINE-DM) 6.25-15 MG/5ML syrup Take 2.5 mLs by mouth 4 (four) times daily as needed for cough.   sucralfate (CARAFATE) 1 g tablet Take 1 tablet (1 g total) by mouth 4 (four) times daily -  before meals and at bedtime.   SUMAtriptan (IMITREX) 100 MG tablet TAKE 1 TABLET AS NEED FOR MIGRAINE. MAY REPEAT IN 2 HOURS IF HEADACHE PERSISTS OR RECURS.   SYMBICORT 80-4.5 MCG/ACT inhaler USE 2 INHALATIONS ORALLY   TWICE DAILY.  Patient has been using. Please fill.   traZODone (DESYREL) 150 MG tablet TAKE 1 TABLET (150 MG TOTAL) BY MOUTH AT BEDTIME AS NEEDED FOR SLEEP.   trospium (SANCTURA) 20 MG tablet TAKE 1 TABLET BY MOUTH TWICE A DAY   valsartan-hydrochlorothiazide (DIOVAN-HCT) 320-25 MG tablet TAKE 1 TABLET BY MOUTH EVERY DAY                       Objective:   Physical Exam   wts  05/23/2022      235   11/17/2014        230     10/20/14 226 lb (102.513 kg)  10/17/14 227 lb (102.967 kg)  10/10/14 225 lb (102.059 kg)     Vital signs reviewed  05/23/2022  - Note at rest 02 sats  94% on RA   General appearance:    pleasant amb wf   with extremely harsh honking dry coughing fits    HEENT : Oropharynx  clear      Nasal turbinates nl    NECK :  without  apparent JVD/ palpable Nodes/TM    LUNGS: no acc muscle use,  Nl contour chest which is clear to A and P bilaterally without cough on insp or exp maneuvers   CV:  RRR  no s3 or murmur or increase in P2, and no edema   ABD:  mod obese soft and nontender with nl inspiratory excursion in the supine position. No bruits or organomegaly appreciated   MS:  Nl gait/ ext warm without deformities Or obvious joint restrictions  calf tenderness, cyanosis or clubbing    SKIN: warm and dry without lesions    NEURO:  alert, approp, nl sensorium with  no motor or cerebellar deficits apparent.      I personally reviewed images and agree with radiology impression as follows:  CXR:   pa and lateral 02/14/22  1. Moderately large hiatal hernia. 2. No acute cardiopulmonary process.    Assessment & Plan:

## 2022-05-23 NOTE — Patient Instructions (Signed)
For cough > phenergan dm as needed   Depomedrol 120 mg IM   Omnicef 300 mg twice daily until gone   Gabapentin 100 mg four times daily and add extra dose every few days as needed  For drainage / throat tickle try take CHLORPHENIRAMINE  4 mg  ("Allergy Relief" '4mg'$   at [facility] should be easiest to find in the blue box usually on bottom shelf)  take one every 4 hours as needed - extremely effective and inexpensive over the counter- may cause drowsiness so start with just a dose or two an hour before bedtime and see how you tolerate it before trying in daytime.   Continue Symbicort 80 Take 2 puffs first thing in am and then another 2 puffs about 12 hours later.    Only use your albuterol as a rescue medication to be used if you can't catch your breath by resting or doing a relaxed purse lip breathing pattern.  - The less you use it, the better it will work when you need it. - Ok to use up to 2 puffs  every 4 hours if you must but call for immediate appointment if use goes up over your usual need - Don't leave home without it !!  (think of it like the spare tire for your car)   GERD (REFLUX)  is an extremely common cause of respiratory symptoms just like yours , many times with no obvious heartburn at all.    It can be treated with medication, but also with lifestyle changes including elevation of the head of your bed (ideally with 6 -8inch blocks under the headboard of your bed),  Smoking cessation, avoidance of late meals, excessive alcohol, and avoid fatty foods, chocolate, peppermint, colas, red wine, and acidic juices such as orange juice.  NO MINT OR MENTHOL PRODUCTS SO NO COUGH DROPS  USE SUGARLESS CANDY INSTEAD (Jolley ranchers or Stover's or Life Savers) or even ice chips will also do - the key is to swallow to prevent all throat clearing. NO OIL BASED VITAMINS - use powdered substitutes.  Avoid fish oil when coughing.   Please schedule a follow up office visit in 4  weeks,  call sooner if needed with all medications /inhalers/ solutions in hand so we can verify exactly what you are taking. This includes all medications from all doctors and over the [place] separate them into two bags:  the ones you take automatically, no matter what, vs the ones you take just when you feel you need them "BAG #2 is UP TO YOU"  - this will really help [place] help you take your medications more effectively.

## 2022-05-23 NOTE — Assessment & Plan Note (Addendum)
On background of allergic rhinitis on immunotherapy per [name] since ? 2016  - FENO during flare of cough 05/23/2022 = 40  On symb 80 2bid though hfa suboptimal - 05/23/2022  After extensive coaching inhaler device,  effectiveness =  75% (short Ti)    rec Continue rx for UACS and continue symbicort 80 2bid  Depomedrol 120 mg IM today  saba prn   Advised The standardized cough guidelines published in Chest by Lissa Morales in 2006 are still the best available and consist of a multiple step process (up to 12!) , not a single office visit,  and are intended  to address this problem logically,  with an alogrithm dependent on response to empiric treatment at  each progressive step  to determine a specific diagnosis with  minimal addtional testing needed. Therefore if adherence is an issue or can't be accurately verified,  it's very unlikely the standard evaluation and treatment will be successful here.    Furthermore, response to therapy (other than acute cough suppression, which should only be used short term with avoidance of narcotic containing cough syrups if possible), can be a gradual process for which the patient is not likely to  perceive immediate benefit.  Unlike going to an eye doctor where the best perscription is almost always the first one and is immediately effective, this is almost never the case in the management of chronic cough syndromes. Therefore the patient needs to commit up front to consistently adhere to recommendations  for up to 6 weeks of therapy directed at the likely underlying problem(s) before the response can be reasonably evaluated.   rec return in 4 weeks with all meds in hand using a trust but verify approach to confirm accurate Medication  Reconciliation The principal here is that until we are certain that the  patients are doing what we've asked, it makes no sense to ask them to do more.   Each maintenance medication was reviewed in detail including emphasizing most  importantly the difference between maintenance and prns and under what circumstances the prns are to be triggered using an action plan format where appropriate.  Total time for H and P, chart review, counseling, reviewing hfa device(s) and generating customized AVS unique to this office visit / same day charting > 45 min with more than one clinical dx with refractory symptoms > 5 years for pt not seen since 2016

## 2022-05-26 ENCOUNTER — Telehealth: Payer: Self-pay | Admitting: Family Medicine

## 2022-05-26 DIAGNOSIS — M9901 Segmental and somatic dysfunction of cervical region: Secondary | ICD-10-CM | POA: Diagnosis not present

## 2022-05-26 DIAGNOSIS — M5431 Sciatica, right side: Secondary | ICD-10-CM | POA: Diagnosis not present

## 2022-05-26 DIAGNOSIS — M9903 Segmental and somatic dysfunction of lumbar region: Secondary | ICD-10-CM | POA: Diagnosis not present

## 2022-05-26 DIAGNOSIS — M5136 Other intervertebral disc degeneration, lumbar region: Secondary | ICD-10-CM | POA: Diagnosis not present

## 2022-05-26 DIAGNOSIS — M5411 Radiculopathy, occipito-atlanto-axial region: Secondary | ICD-10-CM | POA: Diagnosis not present

## 2022-05-26 DIAGNOSIS — M9902 Segmental and somatic dysfunction of thoracic region: Secondary | ICD-10-CM | POA: Diagnosis not present

## 2022-05-26 NOTE — Telephone Encounter (Signed)
This will need to come from her pulmonologist if medically appropriate.  Saw Dr Melvyn Novas last week.

## 2022-05-26 NOTE — Telephone Encounter (Signed)
REFERRAL REQUEST Telephone Note  Have you been seen at our office for this problem? YES (Advise that they may need an appointment with their PCP before a referral can be done)  Reason for Referral: MRI of lungs Referral discussed with patient: YES  Best contact number of patient for referral team: [phone]    Has patient been seen by a specialist for this issue before: YES  Patient provider preference for referral: N/A Patient location preference for referral: Place on [address]   Patient notified that referrals can take up to a week or longer to process. If they haven't heard anything within a week they should call back and speak with the referral department.

## 2022-05-26 NOTE — Telephone Encounter (Signed)
Pt states she asked dr wert and he wouldn't not order, she will try and call him again to see if he is able to order

## 2022-05-27 ENCOUNTER — Telehealth: Payer: Self-pay | Admitting: Internal Medicine

## 2022-05-27 DIAGNOSIS — J3081 Allergic rhinitis due to animal (cat) (dog) hair and dander: Secondary | ICD-10-CM | POA: Diagnosis not present

## 2022-05-27 DIAGNOSIS — J301 Allergic rhinitis due to pollen: Secondary | ICD-10-CM | POA: Diagnosis not present

## 2022-05-27 DIAGNOSIS — R053 Chronic cough: Secondary | ICD-10-CM

## 2022-05-27 DIAGNOSIS — J3089 Other allergic rhinitis: Secondary | ICD-10-CM | POA: Diagnosis not present

## 2022-05-27 NOTE — Telephone Encounter (Signed)
Called and left voicemail for patient to call office back to see if these two scans were ok for her.

## 2022-05-27 NOTE — Telephone Encounter (Signed)
Would do HRCT chest and Sinus ct limited dx chronic refractory cough

## 2022-05-27 NOTE — Telephone Encounter (Signed)
Called patient and she states that she is wanting CT scan. She states that she would like to have one done  Please advise sir

## 2022-05-28 NOTE — Telephone Encounter (Signed)
Pt returned call. Let her know that Dr. Melvyn Novas was fine with  [place] ordering the CT of both her chest and sinuses and let her know that PCCs will call her to get her scheduled. Pt verbalized understanding. Orders placed. Nothing further needed.

## 2022-05-29 NOTE — Addendum Note (Signed)
Addended by: Lorretta Harp on: 05/29/2022 09:31 AM   Modules accepted: Orders

## 2022-06-02 ENCOUNTER — Ambulatory Visit (INDEPENDENT_AMBULATORY_CARE_PROVIDER_SITE_OTHER): Payer: Medicare HMO

## 2022-06-02 DIAGNOSIS — Z23 Encounter for immunization: Secondary | ICD-10-CM

## 2022-06-03 DIAGNOSIS — J3089 Other allergic rhinitis: Secondary | ICD-10-CM | POA: Diagnosis not present

## 2022-06-03 DIAGNOSIS — J301 Allergic rhinitis due to pollen: Secondary | ICD-10-CM | POA: Diagnosis not present

## 2022-06-03 DIAGNOSIS — J3081 Allergic rhinitis due to animal (cat) (dog) hair and dander: Secondary | ICD-10-CM | POA: Diagnosis not present

## 2022-06-04 ENCOUNTER — Other Ambulatory Visit: Payer: Self-pay | Admitting: Family Medicine

## 2022-06-04 DIAGNOSIS — I10 Essential (primary) hypertension: Secondary | ICD-10-CM

## 2022-06-05 ENCOUNTER — Other Ambulatory Visit: Payer: Self-pay | Admitting: Family Medicine

## 2022-06-05 ENCOUNTER — Institutional Professional Consult (permissible substitution): Payer: Medicare HMO | Admitting: Pulmonary Disease

## 2022-06-05 DIAGNOSIS — Z1231 Encounter for screening mammogram for malignant neoplasm of breast: Secondary | ICD-10-CM

## 2022-06-17 ENCOUNTER — Encounter: Payer: Self-pay | Admitting: Family Medicine

## 2022-06-17 ENCOUNTER — Ambulatory Visit (INDEPENDENT_AMBULATORY_CARE_PROVIDER_SITE_OTHER): Payer: Medicare HMO

## 2022-06-17 ENCOUNTER — Ambulatory Visit (INDEPENDENT_AMBULATORY_CARE_PROVIDER_SITE_OTHER): Payer: Medicare HMO | Admitting: Family Medicine

## 2022-06-17 VITALS — BP 118/66 | HR 81 | Temp 97.0°F | Ht 69.0 in | Wt 240.0 lb

## 2022-06-17 DIAGNOSIS — M25561 Pain in right knee: Secondary | ICD-10-CM | POA: Diagnosis not present

## 2022-06-17 DIAGNOSIS — M25562 Pain in left knee: Secondary | ICD-10-CM

## 2022-06-17 DIAGNOSIS — Z9181 History of falling: Secondary | ICD-10-CM | POA: Diagnosis not present

## 2022-06-17 DIAGNOSIS — M7122 Synovial cyst of popliteal space [Baker], left knee: Secondary | ICD-10-CM | POA: Diagnosis not present

## 2022-06-17 DIAGNOSIS — W19XXXA Unspecified fall, initial encounter: Secondary | ICD-10-CM

## 2022-06-17 MED ORDER — PREDNISONE 20 MG PO TABS: ORAL_TABLET | ORAL | 0 refills | Status: DC

## 2022-06-17 NOTE — Progress Notes (Signed)
Subjective:  Patient ID: Kristina Mclaughlin, female    DOB: 09-13-1950, 72 y.o.   MRN: [id number]  Patient Care Team: Janora Norlander, DO as PCP - General (Family Medicine) Lavonna Monarch, MD (Inactive) as Consulting Physician (Dermatology) Tiajuana Amass, MD as Referring Physician (Allergy and Immunology) Celestia Khat, OD (Optometry)   Chief Complaint:  Knee Pain (Left knee pain after two falls thur night and Saturday night)   HPI: Kristina Mclaughlin is a 71 y.o. female presenting on 06/17/2022 for Knee Pain (Left knee pain after two falls thur night and Saturday night)   Pt presents today with bilateral knee pain after 2 recent falls. Left worse than right with swelling and tenderness. States pain is sharp/shooting to aching and burning. Worse with climbing stairs. Has been taking ibuprofen with minimal relief of symptoms.   Knee Pain  The incident occurred 3 to 5 days ago. The incident occurred at home. The injury mechanism was a fall. The pain is present in the right knee and left knee. The quality of the pain is described as aching, burning and shooting. The pain is moderate. The pain has been Fluctuating since onset. She reports no foreign bodies present. The symptoms are aggravated by movement, palpation and weight bearing. She has tried NSAIDs for the symptoms. The treatment provided no relief.    Relevant past medical, surgical, family, and social history reviewed and updated as indicated.  Allergies and medications reviewed and updated. Data reviewed: Chart in Epic.   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, lateral back   Essential hypertension 10/29/2015   Hypertension    Insomnia     Past Surgical History:  Procedure Laterality Date   ABDOMINAL HYSTERECTOMY     BACK SURGERY     BREAST SURGERY     biopsy x2; reduction   EXAMINATION UNDER ANESTHESIA  08/27/2012   LIPOMA EXCISION  Right 07/03/2021   Procedure: EXCISION LIPOMA; ANKLE;  Surgeon: Virl Cagey, MD;  Location: [facility];  Service: General;  Laterality: Right;   LIPOMA EXCISION N/A 07/03/2021   Procedure: EXCISION LIPOMA; SUPRAPUBIC;  Surgeon: Virl Cagey, MD;  Location: [facility];  Service: General;  Laterality: N/A;   MASS EXCISION Left 07/03/2021   Procedure: EXCISION LIPOMA; ANKLE;  Surgeon: Virl Cagey, MD;  Location: [facility];  Service: General;  Laterality: Left;   MASS EXCISION Left 07/03/2021   Procedure: EXCISION OF LIPOMA; ARM;  Surgeon: Virl Cagey, MD;  Location: [facility];  Service: General;  Laterality: Left;   REDUCTION MAMMAPLASTY Bilateral 2002    Social History   Socioeconomic History   Marital status: Married    Spouse name: Not on file   Number of children: 2   Years of education: Not on file   Highest education level: Not on file  Occupational History   Occupation: Purchasing   Tobacco Use   Smoking status: Never   Smokeless tobacco: Never  Vaping Use   Vaping Use: Never used  Substance and Sexual Activity   Alcohol use: No    Alcohol/week: 0.0 standard drinks of alcohol   Drug use: No   Sexual activity: Not on file  Other Topics Concern   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  She tries to stay active.   Social Determinants of Health   Financial Resource Strain: Low Risk  (01/21/2022)   Overall Financial Resource Strain (CARDIA)    Difficulty of Paying Living Expenses: Not hard at all  Food Insecurity: No Food Insecurity (01/21/2022)   Hunger Vital Sign    Worried About Running Out of Food in the Last Year: Never true    Ran Out of Food in the Last Year: Never true  Transportation Needs: No Transportation Needs (01/21/2022)   PRAPARE - Hydrologist (Medical): No    Lack of Transportation  (Non-Medical): No  Physical Activity: Sufficiently Active (01/21/2022)   Exercise Vital Sign    Days of Exercise per Week: 3 days    Minutes of Exercise per Session: 60 min  Stress: No Stress Concern Present (01/21/2022)   [place]    Feeling of Stress : Not at all  Social Connections: [place] (01/21/2022)   Social Connection and Isolation Panel [NHANES]    Frequency of Communication with Friends and Family: More than three times a week    Frequency of Social Gatherings with Friends and Family: More than three times a week    Attends Religious Services: More than 4 times per year    Active Member of [employer] or Organizations: Yes    Attends Archivist Meetings: More than 4 times per year    Marital Status: Married  Human resources officer Violence: Not At Risk (01/21/2022)   Humiliation, Afraid, Rape, and Kick questionnaire    Fear of Current or Ex-Partner: No    Emotionally Abused: No    Physically Abused: No    Sexually Abused: No    Outpatient Encounter Medications as of 06/17/2022  Medication Sig   albuterol (VENTOLIN HFA) 108 (90 Base) MCG/ACT inhaler TAKE 2 PUFFS BY MOUTH EVERY 6 HOURS AS NEEDED FOR WHEEZE OR SHORTNESS OF BREATH   amLODipine (NORVASC) 5 MG tablet Take 1 tablet (5 mg total) by mouth daily.   atorvastatin (LIPITOR) 40 MG tablet TAKE 1 TABLET BY MOUTH EVERY DAY   Azelastine HCl 137 MCG/SPRAY SOLN Place into both nostrils.   calcium carbonate (OSCAL) 1500 (600 Ca) MG TABS tablet Take 600 mg of elemental calcium by mouth daily with breakfast.   cyclobenzaprine (FLEXERIL) 10 MG tablet Take 1 tablet (10 mg total) by mouth 3 (three) times daily as needed for muscle spasms.   diclofenac Sodium (VOLTAREN) 1 % GEL Apply 2 g topically 4 (four) times daily.   EPINEPHrine 0.3 mg/0.3 mL IJ SOAJ injection Inject 0.3 mg into the muscle as needed for anaphylaxis.   fexofenadine (ALLEGRA) 180 MG tablet  Take 1 tablet (180 mg total) by mouth daily.   fluticasone (FLONASE) 50 MCG/ACT nasal spray SPRAY 2 SPRAYS INTO EACH NOSTRIL EVERY DAY   gabapentin (NEURONTIN) 100 MG capsule Take 1 capsule (100 mg total) by mouth 4 (four) times daily. One three times daily   ketoconazole (NIZORAL) 2 % shampoo APPLY 1 APPLICATION TOPICALLY 2 (TWO) TIMES A WEEK.   linaCLOtide (LINZESS PO) Take by mouth.   Multiple Vitamin (MULTI-VITAMIN PO) Take 1 tablet by mouth daily.    ondansetron (ZOFRAN) 4 MG tablet Take 1 tablet (4 mg total) by mouth every 8 (eight) hours as needed.   pantoprazole (PROTONIX) 40 MG tablet Take 1 tablet (40 mg total) by mouth 2 (two) times daily before a meal.   Polyethylene Glycol 400 (  BLINK TEARS OP) Place 1 drop into both eyes 2 (two) times daily as needed (dry eyes).   predniSONE (DELTASONE) 20 MG tablet 2 po at sametime daily for 5 days- start tomorrow   sucralfate (CARAFATE) 1 g tablet Take 1 tablet (1 g total) by mouth 4 (four) times daily -  before meals and at bedtime.   SUMAtriptan (IMITREX) 100 MG tablet TAKE 1 TABLET AS NEED FOR MIGRAINE. MAY REPEAT IN 2 HOURS IF HEADACHE PERSISTS OR RECURS.   SYMBICORT 80-4.5 MCG/ACT inhaler USE 2 INHALATIONS ORALLY   TWICE DAILY.  Patient has been using. Please fill.   traZODone (DESYREL) 150 MG tablet TAKE 1 TABLET (150 MG TOTAL) BY MOUTH AT BEDTIME AS NEEDED FOR SLEEP.   trospium (SANCTURA) 20 MG tablet TAKE 1 TABLET BY MOUTH TWICE A DAY   valsartan-hydrochlorothiazide (DIOVAN-HCT) 320-25 MG tablet TAKE 1 TABLET BY MOUTH EVERY DAY   [DISCONTINUED] cefdinir (OMNICEF) 300 MG capsule Take 1 capsule (300 mg total) by mouth 2 (two) times daily. 1 po BID   [DISCONTINUED] promethazine-dextromethorphan (PROMETHAZINE-DM) 6.25-15 MG/5ML syrup Take 2.5 mLs by mouth 4 (four) times daily as needed for cough.   No facility-administered encounter medications on file as of 06/17/2022.    Allergies  Allergen Reactions   Lovenox [Enoxaparin Sodium]  Anaphylaxis   Moxifloxacin Anaphylaxis and Other (See Comments)   Quinolones Hives and Shortness Of Breath   Guaifenesin Er    Mucinex Sinus-Max [Phenylephrine-Apap-Guaifenesin] Swelling   Sulfamethoxazole-Trimethoprim Other (See Comments)    Review of Systems  Constitutional:  Negative for activity change, appetite change, chills, fatigue and fever.  HENT: Negative.    Eyes: Negative.   Respiratory:  Negative for cough, chest tightness and shortness of breath.   Cardiovascular:  Negative for chest pain, palpitations and leg swelling.  Gastrointestinal:  Negative for abdominal pain, blood in stool, constipation, diarrhea, nausea and vomiting.  Endocrine: Negative.   Genitourinary:  Negative for dysuria, frequency and urgency.  Musculoskeletal:  Positive for arthralgias, gait problem and joint swelling. Negative for back pain, myalgias, neck pain and neck stiffness.  Skin: Negative.   Allergic/Immunologic: Negative.   Neurological:  Negative for dizziness, weakness and headaches.  Hematological: Negative.   Psychiatric/Behavioral:  Negative for confusion, hallucinations, sleep disturbance and suicidal ideas.   All other systems reviewed and are negative.       Objective:  BP 118/66   Pulse 81   Temp (!) 97 F (36.1 C) (Temporal)   Ht '5\' 9"'$  (1.753 m)   Wt 240 lb (108.9 kg)   SpO2 93%   BMI 35.44 kg/m    Wt Readings from Last 3 Encounters:  06/17/22 240 lb (108.9 kg)  05/23/22 235 lb 6.4 oz (106.8 kg)  05/19/22 237 lb 12.8 oz (107.9 kg)    Physical Exam Vitals and nursing note reviewed.  Constitutional:      General: She is not in acute distress.    Appearance: Normal appearance. She is obese. She is not ill-appearing, toxic-appearing or diaphoretic.  HENT:     Head: Normocephalic and atraumatic.     Nose: Nose normal.     Mouth/Throat:     Mouth: Mucous membranes are moist.  Eyes:     Pupils: Pupils are equal, round, and reactive to light.  Cardiovascular:      Rate and Rhythm: Normal rate and regular rhythm.     Pulses: Normal pulses.     Heart sounds: Normal heart sounds.  Pulmonary:  Effort: Pulmonary effort is normal.     Breath sounds: Normal breath sounds.  Musculoskeletal:     Right upper leg: Normal.     Left upper leg: Normal.     Right knee: No swelling, deformity, effusion, erythema, ecchymosis, lacerations, bony tenderness or crepitus. Decreased range of motion. Tenderness present. No LCL laxity, MCL laxity, ACL laxity or PCL laxity. Normal alignment, normal meniscus and normal patellar mobility. Normal pulse.     Instability Tests: Anterior drawer test negative. Posterior drawer test negative. Anterior Lachman test negative. Medial McMurray test negative and lateral McMurray test negative.     Left knee: Swelling (popliteal space) and effusion present. No deformity, erythema, ecchymosis, lacerations, bony tenderness or crepitus. Decreased range of motion. Tenderness present. No LCL laxity, MCL laxity, ACL laxity or PCL laxity.Normal alignment, normal meniscus and normal patellar mobility. Normal pulse.     Instability Tests: Anterior drawer test negative. Posterior drawer test negative. Anterior Lachman test negative. Medial McMurray test negative and lateral McMurray test negative.     Right lower leg: Normal. No edema.     Left lower leg: Normal. No edema.  Skin:    General: Skin is warm and dry.     Capillary Refill: Capillary refill takes less than 2 seconds.  Neurological:     General: No focal deficit present.     Mental Status: She is alert and oriented to person, place, and time.  Psychiatric:        Mood and Affect: Mood normal.        Behavior: Behavior normal.        Thought Content: Thought content normal.        Judgment: Judgment normal.     Results for orders placed or performed in visit on 05/23/22  Nitric oxide  Result Value Ref Range   Nitric Oxide 40      X-Ray: left knee: soft tissue swelling in  popliteal space. Joint space in both knees preserved. No acute findings. Preliminary x-ray reading by Monia Pouch, FNP-C, [facility].   Pertinent labs & imaging results that were available during my care of the patient were reviewed by me and considered in my medical decision making.  Assessment & Plan:  Safiya was seen today for knee pain.  Diagnoses and all orders for this visit:  Fall, initial encounter Acute pain of left knee Acute pain of right knee Synovial cyst of left popliteal space Imaging without acute findings. Brace applied to left knee in office. Will burst with steroids and refer back to ortho. Pt aware to report new, worsening, or persistent symptoms.  -     predniSONE (DELTASONE) 20 MG tablet; 2 po at sametime daily for 5 days- start tomorrow -     Ambulatory referral to Orthopedic Surgery     Continue all other maintenance medications.  Follow up plan: Return if symptoms worsen or fail to improve.   Continue healthy lifestyle choices, including diet (rich in fruits, vegetables, and lean proteins, and low in salt and simple carbohydrates) and exercise (at least 30 minutes of moderate physical activity daily).  Educational handout given for baker cyst  The above assessment and management plan was discussed with the patient. The patient verbalized understanding of and has agreed to the management plan. Patient is aware to call the clinic if they develop any new symptoms or if symptoms persist or worsen. Patient is aware when to return to the clinic for a follow-up visit. Patient educated on when it  is appropriate to go to the emergency department.   Monia Pouch, FNP-C [facility] [phone]

## 2022-06-20 ENCOUNTER — Encounter: Payer: Self-pay | Admitting: Internal Medicine

## 2022-06-20 ENCOUNTER — Ambulatory Visit: Payer: Medicare HMO | Admitting: Internal Medicine

## 2022-06-20 VITALS — BP 128/68 | HR 85 | Temp 97.9°F | Ht 69.0 in | Wt 241.2 lb

## 2022-06-20 DIAGNOSIS — J45991 Cough variant asthma: Secondary | ICD-10-CM | POA: Diagnosis not present

## 2022-06-20 DIAGNOSIS — R058 Other specified cough: Secondary | ICD-10-CM | POA: Diagnosis not present

## 2022-06-20 LAB — POCT EXHALED NITRIC OXIDE: FeNO level (ppb): 85

## 2022-06-20 MED ORDER — GABAPENTIN 300 MG PO CAPS: 300.0000 mg | ORAL_CAPSULE | Freq: Two times a day (BID) | ORAL | 2 refills | Status: DC

## 2022-06-20 NOTE — Progress Notes (Signed)
Subjective:    Patient ID: Kristina Mclaughlin, female    DOB: January 14, 1951,    MRN: [id number]    Brief patient profile:  15  yowf light smoker quit early 20's with onset also about the same time of rhinitis sping > fall rhinitis freq ov's never prednisone / mostly antihistamines some better over the years but tendency to bad colds not related to seasons rx with abx / cough med then better within a couple of weeks to a month before better for a year but starting Nov 2015 onset of head cold while on vacation in [place]  And coughing ever since so referred by Dr Morrie Sheldon to pulmonary office 10/20/14   Allergy shots per [name] since ? 2016 and repeated in 2023 and less positive    History of Present Illness  10/20/2014 1st [facility] office visit/ Kristina Mclaughlin   Chief Complaint  Patient presents with   Pulmonary Consult    Referred by Dr. Laurance Flatten. Pt c/o cough on and off since Nov 2015. She states cough is worse at night and is occ prod with minimal clear sputum. Sometimes feels as if she can not take in a good, deep breath.   cough worse p supper also can't lie down due to cough and has lots of am drainage but nothing purulent Has had reflux for years maintained on ppi  rec Please see patient coordinator before you leave today  to schedule sinus CT and we will call you with results  First take delsym two tsp every 12 hours and supplement if needed with  Percocet  up to 2 every 4 hours to suppress the urge to cough at all or even clear your throat. Swallowing water or using ice chips/non mint and menthol containing candies (such as lifesavers or sugarless jolly ranchers) are also effective.  You should rest your voice and avoid activities that you know make you cough. Once you have eliminated the cough for 3 straight days try reducing the percocet   then the delsym as tolerated.   Prednisone  Taper off as you plan Protonix (pantoprazole) Take 30-60 min before first meal of the day and take prilosec  30 min before supper and zantac 150 one bedtime plus chlorpheniramine 4 mg x 2 at bedtime (both available over the counter)  until cough is completely gone for at least a week without the need for cough suppression GERD diet  - sinus CT 11/06/2014 >>>  Acute/ chronic sinusitis > augmentin x 20 days     11/17/2014 f/u ov/Kristina Mclaughlin re: chronic cough "I've made 10 ov's  and no one has helped me" Chief Complaint  Patient presents with   Acute Visit    Pt states having increased cough for the past wk. In the am she is coughing up minimal green sputum.   cough never resolved but did not use percocet consistently yet cough was some better until 4/11 then 4/12 while on still on augmentin flared again same pattern as before with barking harsh quality 24/7 Rec zpak Prednisone 10 mg take  4 each am x 2 days,   2 each am x 2 days,  1 each am x 2 days and stop Take delsym (or mucinex dm 600 mg)  two tsp every 12 hours and supplement if needed with  Percocet  up to 2 every 4 hours  Once you have eliminated the cough for 3 straight days try reducing the percocet first,  then the delsym as tolerated.  Use flutter valve whenever you feel need to cough  Continue  Pantoprazole (protonix) 40 mg   Take 30-60 min before first meal of the day and zantac 150  one bedtime until return to office Keep appt with Benjamine Mola and return here with all meds in hand if not satisfied after you finish with Dr Benjamine Mola > rec gabapentin resolved but recurred off it       05/23/2022  f/u ov/Kristina Mclaughlin re:  cough maint on symbicort 80/max gerd rx, no gabapentin   Chief Complaint  Patient presents with   Pulmonary Consult    Referred by Ronnie Doss, DO.  Pt seen here previously for cough in 2016. She states cough is still an issue- it's bothered her off and on all year. She is coughing until she's gagging. Cough is esp worse at night. She is coughing up green sputum.    Dyspnea:  fine unless coughing  Cough: late evening and noct  and  sporadic during the day and turned green last few weeks severe to point of gagging  Sleeping: on side  30-45 degrees  SABA use: 3 x times  02: none  Covid status:   vax x 2,  infected x 3  Rec For cough > phenergan dm as needed  Depomedrol 120 mg IM  Omnicef 300 mg twice daily until gone  Gabapentin 100 mg four times daily and add extra dose every few days as needed For drainage / throat tickle try take CHLORPHENIRAMINE  4 mg   Continue Symbicort 80 Take 2 puffs first thing in am and then another 2 puffs about 12 hours later.  Only use your albuterol as a rescue medication  GERD diet reviewed, bed blocks rec  Please schedule a follow up office visit in 4  weeks, call sooner if needed with all medications /inhalers/ solutions in hand     06/20/2022  f/u ov/Kristina Mclaughlin re: cough x 2017 in setting or rhinitis    maint on gabapentin 500 mg per day   and getting confused between symb and saba  Chief Complaint  Patient presents with   Follow-up    Cough persistent.  Gabapentin helps with cough.   Dyspnea:  Not limited by breathing from desired activities   Cough: dry hack sporadic  Sleeping: no resp cc  on 30 degrees elevated hob  and does fine  SABA use: confused with saba vs symbicort,  ? When stopped symbicort  ? 02: none      No obvious day to day or daytime variability or assoc excess/ purulent sputum or mucus plugs or hemoptysis or cp or chest tightness, subjective wheeze or overt sinus or hb symptoms.   Sleeping  without nocturnal  or early am exacerbation  of respiratory  c/o's or need for noct saba. Also denies any obvious fluctuation of symptoms with weather or environmental changes or other aggravating or alleviating factors except as outlined above   No unusual exposure hx or h/o childhood pna/ asthma or knowledge of premature birth.  Current Allergies, Complete Past Medical History, Past Surgical History, Family History, and Social History were reviewed in [facility] record.  ROS  The following are not active complaints unless bolded Hoarseness, sore throat, dysphagia, dental problems, itching, sneezing,  nasal congestion or discharge of excess mucus or purulent secretions, ear ache,   fever, chills, sweats, unintended wt loss or wt gain, classically pleuritic or exertional cp,  orthopnea pnd or arm/hand swelling  or leg swelling, presyncope,  palpitations, abdominal pain, anorexia, nausea, vomiting, diarrhea  or change in bowel habits or change in bladder habits, change in stools or change in urine, dysuria, hematuria,  rash, arthralgias, visual complaints, headache, numbness, weakness or ataxia or problems with walking or coordination,  change in mood or  memory.        Current Meds  Medication Sig   albuterol (VENTOLIN HFA) 108 (90 Base) MCG/ACT inhaler TAKE 2 PUFFS BY MOUTH EVERY 6 HOURS AS NEEDED FOR WHEEZE OR SHORTNESS OF BREATH   amLODipine (NORVASC) 5 MG tablet Take 1 tablet (5 mg total) by mouth daily.   atorvastatin (LIPITOR) 40 MG tablet TAKE 1 TABLET BY MOUTH EVERY DAY   Azelastine HCl 137 MCG/SPRAY SOLN Place into both nostrils.   calcium carbonate (OSCAL) 1500 (600 Ca) MG TABS tablet Take 600 mg of elemental calcium by mouth daily with breakfast.   cyclobenzaprine (FLEXERIL) 10 MG tablet Take 1 tablet (10 mg total) by mouth 3 (three) times daily as needed for muscle spasms.   diclofenac Sodium (VOLTAREN) 1 % GEL Apply 2 g topically 4 (four) times daily.   EPINEPHrine 0.3 mg/0.3 mL IJ SOAJ injection Inject 0.3 mg into the muscle as needed for anaphylaxis.   fexofenadine (ALLEGRA) 180 MG tablet Take 1 tablet (180 mg total) by mouth daily.   fluticasone (FLONASE) 50 MCG/ACT nasal spray SPRAY 2 SPRAYS INTO EACH NOSTRIL EVERY DAY   gabapentin (NEURONTIN) 100 MG capsule Take 1 capsule (100 mg total) by mouth 4 (four) times daily. One three times daily   ketoconazole (NIZORAL) 2 % shampoo APPLY 1 APPLICATION TOPICALLY 2 (TWO) TIMES A  WEEK.   linaCLOtide (LINZESS PO) Take by mouth.   Multiple Vitamin (MULTI-VITAMIN PO) Take 1 tablet by mouth daily.    ondansetron (ZOFRAN) 4 MG tablet Take 1 tablet (4 mg total) by mouth every 8 (eight) hours as needed.   pantoprazole (PROTONIX) 40 MG tablet Take 1 tablet (40 mg total) by mouth 2 (two) times daily before a meal.   Polyethylene Glycol 400 (BLINK TEARS OP) Place 1 drop into both eyes 2 (two) times daily as needed (dry eyes).   predniSONE (DELTASONE) 20 MG tablet 2 po at sametime daily for 5 days- start tomorrow   sucralfate (CARAFATE) 1 g tablet Take 1 tablet (1 g total) by mouth 4 (four) times daily -  before meals and at bedtime.   SUMAtriptan (IMITREX) 100 MG tablet TAKE 1 TABLET AS NEED FOR MIGRAINE. MAY REPEAT IN 2 HOURS IF HEADACHE PERSISTS OR RECURS.   SYMBICORT 80-4.5 MCG/ACT inhaler USE 2 INHALATIONS ORALLY   TWICE DAILY.  Patient has been using. Please fill.   traZODone (DESYREL) 150 MG tablet TAKE 1 TABLET (150 MG TOTAL) BY MOUTH AT BEDTIME AS NEEDED FOR SLEEP.   trospium (SANCTURA) 20 MG tablet TAKE 1 TABLET BY MOUTH TWICE A DAY   valsartan-hydrochlorothiazide (DIOVAN-HCT) 320-25 MG tablet TAKE 1 TABLET BY MOUTH EVERY DAY                  Objective:   Physical Exam   Wts  06/20/2022       241    05/23/2022      235   11/17/2014        230     10/20/14 226 lb (102.513 kg)  10/17/14 227 lb (102.967 kg)  10/10/14 225 lb (102.059 kg)     Vital signs reviewed  06/20/2022  - Note at rest 02 sats  96%  on RA   General appearance:    amb wf nad freq throat clearing    HEENT : Oropharynx  clear      Nasal turbinates nl    NECK :  without  apparent JVD/ palpable Nodes/TM    LUNGS: no acc muscle use,  Nl contour chest which is clear to A and P bilaterally without cough on insp or exp maneuvers   CV:  RRR  no s3 or murmur or increase in P2, and no edema   ABD:  soft and nontender with nl inspiratory excursion in the supine position. No bruits or  organomegaly appreciated   MS:  Nl gait/ ext warm without deformities Or obvious joint restrictions  calf tenderness, cyanosis or clubbing    SKIN: warm and dry without lesions    NEURO:  alert, approp, nl sensorium with  no motor or cerebellar deficits apparent.             Assessment & Plan:

## 2022-06-20 NOTE — Patient Instructions (Signed)
Plan A = Automatic = Always=    Symbicort 80 Take 2 puffs first thing in am and then another 2 puffs about 12 hours later.    Work on inhaler technique:  relax and gently blow all the way out then take a nice smooth full deep breath back in, triggering the inhaler at same time you start breathing in.  Hold breath in for at least  5 seconds if you can. Blow out symbicort thru nose. Rinse and gargle with water when done.  If mouth or throat bother you at all,  try brushing teeth/gums/tongue with arm and hammer toothpaste/ make a slurry and gargle and spit out.       Plan B = Backup (to supplement plan A, not to replace it) Only use your albuterol inhaler as a rescue medication to be used if you can't catch your breath by resting or doing a relaxed purse lip breathing pattern.  - The less you use it, the better it will work when you need it. - Ok to use the inhaler up to 2 puffs  every 4 hours if you must but call for appointment if use goes up over your usual need - Don't leave home without it !!  (think of it like the spare tire for your car)     Gabapentin 300 mg twice daily    Please schedule a follow up visit in 3 months but call sooner if needed

## 2022-06-20 NOTE — Assessment & Plan Note (Signed)
Onset 2015 on a background of allergic rhinitis onset in her  20's -Allergy shots per [name] since ? 2016 - Hiatal hernia small 10/2014 and much larger by 02/14/22  - sinus CT 11/06/2014 >>>  Acute/ chronic sinusitis > augmentin x 20 days> f/u [facility] then return to pulmonary when [facility] eval complete> rx gabapentin 100% elimination of cough while on it, relapsed shortly p off but never back to [place] - restarted gabapenitn titrate to max of 300 mg qid or whatever dose controls cough  - changed gabapentin 300 mg twice daily 06/20/2022 >>>   Clearly responding to gabapentin better than any other rx to date and still throat clearing on 500 mg per day using the 100s so rec 300 mg bid x 3 m then regroup,  sooner prn

## 2022-06-20 NOTE — Assessment & Plan Note (Signed)
On background of allergic rhinitis on immunotherapy per [name] since ? 2016  - FENO during flare of cough 05/23/2022 = 40  On symb 80 2bid though hfa suboptimal - FENO off symbicort  06/20/2022  = 85 > resume symbicort 80 2bid - 06/20/2022  After extensive coaching inhaler device,  effectiveness =    75% short Ti  Emhasized in her case symbicort 80 should be used as a maint rx to see if can prevent the frequent flares she's had in the past and f/u with Dr [name] planned as well           Each maintenance medication was reviewed in detail including emphasizing most importantly the difference between maintenance and prns and under what circumstances the prns are to be triggered using an action plan format where appropriate.  Total time for H and P, chart review, counseling, reviewing hfa device(s) and generating customized AVS unique to this office visit / same day charting  > 30 min

## 2022-06-24 ENCOUNTER — Encounter: Payer: Self-pay | Admitting: Family Medicine

## 2022-06-24 ENCOUNTER — Telehealth: Payer: Self-pay

## 2022-06-24 ENCOUNTER — Ambulatory Visit (INDEPENDENT_AMBULATORY_CARE_PROVIDER_SITE_OTHER): Payer: Medicare HMO | Admitting: Family Medicine

## 2022-06-24 VITALS — BP 144/82 | HR 80 | Temp 98.2°F | Ht 69.0 in | Wt 242.2 lb

## 2022-06-24 DIAGNOSIS — F329 Major depressive disorder, single episode, unspecified: Secondary | ICD-10-CM

## 2022-06-24 DIAGNOSIS — E669 Obesity, unspecified: Secondary | ICD-10-CM

## 2022-06-24 DIAGNOSIS — R252 Cramp and spasm: Secondary | ICD-10-CM

## 2022-06-24 DIAGNOSIS — E781 Pure hyperglyceridemia: Secondary | ICD-10-CM

## 2022-06-24 DIAGNOSIS — R7303 Prediabetes: Secondary | ICD-10-CM | POA: Diagnosis not present

## 2022-06-24 DIAGNOSIS — R69 Illness, unspecified: Secondary | ICD-10-CM | POA: Diagnosis not present

## 2022-06-24 DIAGNOSIS — R058 Other specified cough: Secondary | ICD-10-CM

## 2022-06-24 DIAGNOSIS — I10 Essential (primary) hypertension: Secondary | ICD-10-CM

## 2022-06-24 MED ORDER — BUPROPION HCL ER (SR) 150 MG PO TB12: 150.0000 mg | ORAL_TABLET | Freq: Every day | ORAL | 3 refills | Status: DC

## 2022-06-24 NOTE — Telephone Encounter (Signed)
Can you help pt with patient assistance for linzess

## 2022-06-24 NOTE — Progress Notes (Signed)
I have separately seen and examined the patient. I have discussed the findings and exam with student Dr Jerline Pain and agree with the below note.  My changes/additions are outlined in BLUE.    S: Patient reports being sad that she has not been able to continue her weight loss.  She injured herself and has been quite sedentary over the last several weeks.  She was working out regularly at the [employer] but really has not been able to do that due to left-sided knee pain and immobility.  She typically eats cereal, oatmeal or pop tarts for breakfast.  Lunch usually consists pimento cheese sandwich or nothing at all.  Her supper will be some type of lean meat like chicken or fish and vegetables plus or minus potatoes.  She does not drink any sugary beverages.  She hydrates well with water.  This is the "biggest that she has ever been" and she is embarrassed to disrobe in front of her spouse.  She is very tearful when talking about this.  She has tried keto and go low in efforts to reduce weight but this has not been helpful.  O: Vitals:   06/24/22 1458  BP: (!) 151/76  Pulse: 80  Temp: 98.2 F (36.8 C)  SpO2: 94%    General appearance: alert, cooperative, appears stated age, and intermittently tearful Eyes: negative findings: lids and lashes normal, conjunctivae and sclerae normal, and corneas clear Lungs:  Normal work of breathing on room air.  No coughing appreciated during today's exam Heart:  Regular rate and rhythm MSK: Gait slightly antalgic.  Left knee in brace Psych: Tearful.  Mood somewhat depressed.     06/24/2022    3:14 PM 05/19/2022   10:38 AM 05/06/2022    3:28 PM  Depression screen PHQ 2/9  Decreased Interest 0 0 0  Down, Depressed, Hopeless 0 0 0  PHQ - 2 Score 0 0 0  Altered sleeping 0 0 0  Tired, decreased energy 0 0 0  Change in appetite 0 0 0  Feeling bad or failure about yourself  0 0 0  Trouble concentrating 0 0 0  Moving slowly or fidgety/restless 0 0 0  Suicidal  thoughts 0 0 0  PHQ-9 Score 0 0 0  Difficult doing work/chores Not difficult at all Not difficult at all Not difficult at all      06/24/2022    3:14 PM 05/19/2022   10:38 AM 05/06/2022    3:28 PM 03/19/2022   10:45 AM  GAD 7 : Generalized Anxiety Score  Nervous, Anxious, on Edge 0 0 0 0  Control/stop worrying 0 0 0 0  Worry too much - different things 0 0 0 0  Trouble relaxing 0 0 0 0  Restless 0 0 0 0  Easily annoyed or irritable 0 0 0 0  Afraid - awful might happen 0 0 0 0  Total GAD 7 Score 0 0 0 0  Anxiety Difficulty Not difficult at all Not difficult at all Not difficult at all Not difficult at all   A/P:  Upper airway cough syndrome - Plan: CMP14+EGFR  Reactive depression - Plan: buPROPion (WELLBUTRIN SR) 150 MG 12 hr tablet, CMP14+EGFR  Essential hypertension - Plan: CMP14+EGFR  Hypertriglyceridemia - Plan: CMP14+EGFR, Lipid panel, TSH, T4, free  Pre-diabetes - Plan: CMP14+EGFR, Bayer DCA Hb A1c Waived  Obesity with serious comorbidity, unspecified classification, unspecified obesity type - Plan: CMP14+EGFR, Lipid panel, TSH, T4, free  Leg cramping - Plan: Magnesium,  Vitamin B12, Ferritin, CBC  Upper airway cough syndrome improved with current recommendations by her specialist.  Wants to start Wellbutrin in efforts to see if this might alleviate some of her stress and perhaps result in some weight loss  Blood pressure is not at goal but she was under some stress today.  Favor holding off on any medication changes for now as she is in the action phase of pursuing lifestyle modification.  She will come in for fasting labs as her A1c has been in the prediabetic range at 6.2 and now with weight gain there is concern for transition into type 2 diabetes.  I have given her information for the bariatric clinic in [place].  We will await the results of her labs to determine which modality of treatment to pursue.  She also mentions some leg cramping during today's visit so I  am adding additional labs to be collected with her fasting labs. ?  MSK related due to an unidentified low back issue  Haruki Arnold M. Lajuana Ripple, [facility]   -------------------------------------------------------------------------------------------------------------------------------------------------------------------------------------      Subjective: PX:TGGYIR management PCP: Janora Norlander, DO SWN:IOEVOJJ Kyra Laffey is a 71 y.o. female presenting to clinic today for:  Weight  The patient expresses frustration due to weight fluctuation, noting a recent increase from 234 to 242 pounds within three weeks. She experienced two falls, the first occurring about two weeks ago and the second on Saturday. She attributes the incidents to misjudging steps in her home and tripping over a carpet. Over the past 5-6 months, she discontinued gym visits due to recovering from [place], and more recently, due to a leg injury. She reports feeling down and depressed about her weight, expressing frustration with perceived fluctuations and a desire to be below 200 pounds. She "does not feel like herself" at her current weight. The patient maintains a generally healthy diet, including lean meats and vegetables, with consumption of carbs and sugar for breakfast. She reports a limited taste for sweets otherwise. She expresses willingness to persist with lifestyle modifications.  Chronic cough  allergies The patient is under the care of pulmonology with Dr. Melvyn Novas for chronic cough, diagnosed as Upper Airway Cough Syndrome, and is currently on a medication regimen comprising albuterol, azelastine nasal spray, and symbicort. Patient takes gabapentin for the cough, and there has been significant improvement. She reports that her cough worsened upon discontinuation of gabapentin and improved again once she restarted it. To further investigate the condition, the patient is scheduled for a CT  scan of both the lungs and sinus cavity at the end of the month. The patient has not required the use of the rescue inhaler (albuterol) while taking Symbicort.  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, lateral back   Essential hypertension 10/29/2015   Hypertension    Insomnia     Current Outpatient Medications:    albuterol (VENTOLIN HFA) 108 (90 Base) MCG/ACT inhaler, TAKE 2 PUFFS BY MOUTH EVERY 6 HOURS AS NEEDED FOR WHEEZE OR SHORTNESS OF BREATH, Disp: 8.5 each, Rfl: 5   amLODipine (NORVASC) 5 MG tablet, Take 1 tablet (5  mg total) by mouth daily., Disp: 90 tablet, Rfl: 0   atorvastatin (LIPITOR) 40 MG tablet, TAKE 1 TABLET BY MOUTH EVERY DAY, Disp: 90 tablet, Rfl: 3   Azelastine HCl 137 MCG/SPRAY SOLN, Place into both nostrils., Disp: , Rfl:    calcium carbonate (OSCAL) 1500 (600 Ca) MG TABS tablet, Take 600 mg of elemental calcium by mouth daily with breakfast., Disp: , Rfl:    cyclobenzaprine (FLEXERIL) 10 MG tablet, Take 1 tablet (10 mg total) by mouth 3 (three) times daily as needed for muscle spasms., Disp: 30 tablet, Rfl: 0   diclofenac Sodium (VOLTAREN) 1 % GEL, Apply 2 g topically 4 (four) times daily., Disp: 50 g, Rfl: 1   EPINEPHrine 0.3 mg/0.3 mL IJ SOAJ injection, Inject 0.3 mg into the muscle as needed for anaphylaxis., Disp: , Rfl:    fexofenadine (ALLEGRA) 180 MG tablet, Take 1 tablet (180 mg total) by mouth daily., Disp: 90 tablet, Rfl: 3   fluticasone (FLONASE) 50 MCG/ACT nasal spray, SPRAY 2 SPRAYS INTO EACH NOSTRIL EVERY DAY, Disp: 48 mL, Rfl: 1   gabapentin (NEURONTIN) 300 MG capsule,  Take 1 capsule (300 mg total) by mouth 2 (two) times daily., Disp: 180 capsule, Rfl: 2   ketoconazole (NIZORAL) 2 % shampoo, APPLY 1 APPLICATION TOPICALLY 2 (TWO) TIMES A WEEK., Disp: 240 mL, Rfl: 0   linaCLOtide (LINZESS PO), Take by mouth., Disp: , Rfl:    Multiple Vitamin (MULTI-VITAMIN PO), Take 1 tablet by mouth daily. , Disp: , Rfl:    ondansetron (ZOFRAN) 4 MG tablet, Take 1 tablet (4 mg total) by mouth every 8 (eight) hours as needed., Disp: 30 tablet, Rfl: 1   pantoprazole (PROTONIX) 40 MG tablet, Take 1 tablet (40 mg total) by mouth 2 (two) times daily before a meal., Disp: 180 tablet, Rfl: 3   Polyethylene Glycol 400 (BLINK TEARS OP), Place 1 drop into both eyes 2 (two) times daily as needed (dry eyes)., Disp: , Rfl:    sucralfate (CARAFATE) 1 g tablet, Take 1 tablet (1 g total) by mouth 4 (four) times daily -  before meals and at bedtime., Disp: 360 tablet, Rfl: 2   SUMAtriptan (IMITREX) 100 MG tablet, TAKE 1 TABLET AS NEED FOR MIGRAINE. MAY REPEAT IN 2 HOURS IF HEADACHE PERSISTS OR RECURS., Disp: 8 tablet, Rfl: prn   SYMBICORT 80-4.5 MCG/ACT inhaler, USE 2 INHALATIONS ORALLY   TWICE DAILY.  Patient has been using. Please fill., Disp: 10.2 g, Rfl: 1   traZODone (DESYREL) 150 MG tablet, TAKE 1 TABLET (150 MG TOTAL) BY MOUTH AT BEDTIME AS NEEDED FOR SLEEP., Disp: 90 tablet, Rfl: 3   trospium (SANCTURA) 20 MG tablet, TAKE 1 TABLET BY MOUTH TWICE A DAY, Disp: 180 tablet, Rfl: 0   valsartan-hydrochlorothiazide (DIOVAN-HCT) 320-25 MG tablet, TAKE 1 TABLET BY MOUTH EVERY DAY, Disp: 90 tablet, Rfl: 1 Social History   Socioeconomic History   Marital status: Married    Spouse name: Not on file   Number of children: 2   Years of education: Not on file   Highest education level: Not on file  Occupational History   Occupation: Purchasing   Tobacco Use   Smoking status: Never   Smokeless tobacco: Never  Vaping Use   Vaping Use: Never used  Substance and Sexual Activity   Alcohol use: No     Alcohol/week: 0.0 standard drinks of alcohol   Drug use: No   Sexual activity: Not on file  Other Topics Concern   Not  on file  Social History Narrative   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  She tries to stay active.   Social Determinants of Health   Financial Resource Strain: Low Risk  (01/21/2022)   Overall Financial Resource Strain (CARDIA)    Difficulty of Paying Living Expenses: Not hard at all  Food Insecurity: No Food Insecurity (01/21/2022)   Hunger Vital Sign    Worried About Running Out of Food in the Last Year: Never true    Ran Out of Food in the Last Year: Never true  Transportation Needs: No Transportation Needs (01/21/2022)   PRAPARE - Hydrologist (Medical): No    Lack of Transportation (Non-Medical): No  Physical Activity: Sufficiently Active (01/21/2022)   Exercise Vital Sign    Days of Exercise per Week: 3 days    Minutes of Exercise per Session: 60 min  Stress: No Stress Concern Present (01/21/2022)   [place]    Feeling of Stress : Not at all  Social Connections: [place] (01/21/2022)   Social Connection and Isolation Panel [NHANES]    Frequency of Communication with Friends and Family: More than three times a week    Frequency of Social Gatherings with Friends and Family: More than three times a week    Attends Religious Services: More than 4 times per year    Active Member of [employer] or Organizations: Yes    Attends Music therapist: More than 4 times per year    Marital Status: Married  Human resources officer Violence: Not At Risk (01/21/2022)   Humiliation, Afraid, Rape, and Kick questionnaire    Fear of Current or Ex-Partner: No    Emotionally Abused: No    Physically Abused: No    Sexually Abused: No   Family History  Problem Relation Age of Onset    Bladder Cancer Mother    Allergies Mother    Heart disease Mother    Allergies Sister    Alzheimer's disease Father    Breast cancer Neg Hx     Objective: Office vital signs reviewed. There were no vitals taken for this visit.  Physical Examination:  General: Awake, alert, well nourished, No acute distress Cardio: regular rate and rhythm, S1S2 heard, no murmurs appreciated Pulm: clear to auscultation bilaterally, no wheezes, rhonchi or rales; normal work of breathing on room air Extremities: warm, well perfused, No edema, cyanosis or clubbing; +1 pulses bilaterally. Mild pitting edema on her left leg.  Skin: dry; intact; no rashes or lesions  Assessment/ Plan: 71 y.o. female   Provided the patient with information for Dr. Dennard Nip for further weight management assistance through lifestyle modification. I anticipate that there may be an emotional component to the patient's challenges with weight loss and her perception of her weight. The patient was open to starting Wellbutrin to address the anxiety she experiences regarding her weight, and I anticipate that this medication may lead to some weight reduction. Considering that the patient has not experienced significant weight changes despite multiple attempts at lifestyle modification and has a BMI over 30, I believe she is a suitable candidate for additional pharmacologic weight management, although her insurance is unlikely to cover the medication at this time. I will conduct lab tests to monitor thyroid function, ruling out low thyroid contributing to difficulties with metabolism. Additionally, I will check A1c,  CBC, CMP, and lipid panel.  The patient reports experiencing lower extremity cramping at night. I plan to check magnesium, vitamin D, and B12 levels.  No orders of the defined types were placed in this encounter.  No orders of the defined types were placed in this encounter.  Stephani Police, MS3

## 2022-06-25 ENCOUNTER — Telehealth: Payer: Self-pay | Admitting: Family Medicine

## 2022-06-25 DIAGNOSIS — J301 Allergic rhinitis due to pollen: Secondary | ICD-10-CM | POA: Diagnosis not present

## 2022-06-25 DIAGNOSIS — J3081 Allergic rhinitis due to animal (cat) (dog) hair and dander: Secondary | ICD-10-CM | POA: Diagnosis not present

## 2022-06-25 DIAGNOSIS — J3089 Other allergic rhinitis: Secondary | ICD-10-CM | POA: Diagnosis not present

## 2022-06-25 NOTE — Telephone Encounter (Signed)
I believe the student did not realize that she was on maximum dose of the hydrochlorothiazide (diuretic).  The student did not realize that.  She can use compression hose for any ongoing swelling of the legs, elevation, avoidance of salt.

## 2022-06-25 NOTE — Telephone Encounter (Signed)
Can you send patient enrollment form for linzess? Not urgent --would prioritize re-enrollment 1st  Thank you!

## 2022-06-25 NOTE — Telephone Encounter (Signed)
Pt aware by vm. 

## 2022-07-02 ENCOUNTER — Ambulatory Visit
Admission: RE | Admit: 2022-07-02 | Discharge: 2022-07-02 | Disposition: A | Discharge status: Home / Self Care | Payer: Medicare HMO | Source: Ambulatory Visit | Attending: Internal Medicine | Admitting: Internal Medicine

## 2022-07-02 DIAGNOSIS — R053 Chronic cough: Secondary | ICD-10-CM

## 2022-07-02 DIAGNOSIS — I700 Atherosclerosis of aorta: Secondary | ICD-10-CM | POA: Diagnosis not present

## 2022-07-02 DIAGNOSIS — J329 Chronic sinusitis, unspecified: Secondary | ICD-10-CM | POA: Diagnosis not present

## 2022-07-02 DIAGNOSIS — K449 Diaphragmatic hernia without obstruction or gangrene: Secondary | ICD-10-CM | POA: Diagnosis not present

## 2022-07-02 DIAGNOSIS — I3139 Other pericardial effusion (noninflammatory): Secondary | ICD-10-CM | POA: Diagnosis not present

## 2022-07-02 DIAGNOSIS — J9811 Atelectasis: Secondary | ICD-10-CM | POA: Diagnosis not present

## 2022-07-03 ENCOUNTER — Other Ambulatory Visit: Payer: Medicare HMO

## 2022-07-03 DIAGNOSIS — I10 Essential (primary) hypertension: Secondary | ICD-10-CM | POA: Diagnosis not present

## 2022-07-03 DIAGNOSIS — F329 Major depressive disorder, single episode, unspecified: Secondary | ICD-10-CM

## 2022-07-03 DIAGNOSIS — R252 Cramp and spasm: Secondary | ICD-10-CM

## 2022-07-03 DIAGNOSIS — E669 Obesity, unspecified: Secondary | ICD-10-CM | POA: Diagnosis not present

## 2022-07-03 DIAGNOSIS — R6889 Other general symptoms and signs: Secondary | ICD-10-CM | POA: Diagnosis not present

## 2022-07-03 DIAGNOSIS — E781 Pure hyperglyceridemia: Secondary | ICD-10-CM | POA: Diagnosis not present

## 2022-07-03 DIAGNOSIS — R7303 Prediabetes: Secondary | ICD-10-CM

## 2022-07-03 DIAGNOSIS — R058 Other specified cough: Secondary | ICD-10-CM | POA: Diagnosis not present

## 2022-07-03 DIAGNOSIS — R69 Illness, unspecified: Secondary | ICD-10-CM | POA: Diagnosis not present

## 2022-07-03 LAB — BAYER DCA HB A1C WAIVED: HB A1C (BAYER DCA - WAIVED): 6.8 % — ABNORMAL HIGH (ref 4.8–5.6)

## 2022-07-04 ENCOUNTER — Other Ambulatory Visit: Payer: Self-pay

## 2022-07-04 ENCOUNTER — Other Ambulatory Visit: Payer: Self-pay | Admitting: Internal Medicine

## 2022-07-04 DIAGNOSIS — I10 Essential (primary) hypertension: Secondary | ICD-10-CM

## 2022-07-04 DIAGNOSIS — J329 Chronic sinusitis, unspecified: Secondary | ICD-10-CM

## 2022-07-04 LAB — CBC
Hematocrit: 40.8 % (ref 34.0–46.6)
Hemoglobin: 14.1 g/dL (ref 11.1–15.9)
MCH: 28.7 pg (ref 26.6–33.0)
MCHC: 34.6 g/dL (ref 31.5–35.7)
MCV: 83 fL (ref 79–97)
Platelets: 218 10*3/uL (ref 150–450)
RBC: 4.92 x10E6/uL (ref 3.77–5.28)
RDW: 13.2 % (ref 11.7–15.4)
WBC: 7.6 10*3/uL (ref 3.4–10.8)

## 2022-07-04 LAB — CMP14+EGFR
ALT: 35 IU/L — ABNORMAL HIGH (ref 0–32)
AST: 18 IU/L (ref 0–40)
Albumin/Globulin Ratio: 2.1 (ref 1.2–2.2)
Albumin: 4.1 g/dL (ref 3.9–4.9)
Alkaline Phosphatase: 104 IU/L (ref 44–121)
BUN/Creatinine Ratio: 11 — ABNORMAL LOW (ref 12–28)
BUN: 9 mg/dL (ref 8–27)
Bilirubin Total: 0.4 mg/dL (ref 0.0–1.2)
CO2: 23 mmol/L (ref 20–29)
Calcium: 9 mg/dL (ref 8.7–10.3)
Chloride: 97 mmol/L (ref 96–106)
Creatinine, Ser: 0.83 mg/dL (ref 0.57–1.00)
Globulin, Total: 2 g/dL (ref 1.5–4.5)
Glucose: 156 mg/dL — ABNORMAL HIGH (ref 70–99)
Potassium: 3.8 mmol/L (ref 3.5–5.2)
Sodium: 134 mmol/L (ref 134–144)
Total Protein: 6.1 g/dL (ref 6.0–8.5)
eGFR: 76 mL/min/{1.73_m2} (ref >59)

## 2022-07-04 LAB — MAGNESIUM: Magnesium: 1.9 mg/dL (ref 1.6–2.3)

## 2022-07-04 LAB — T4, FREE: Free T4: 1.38 ng/dL (ref 0.82–1.77)

## 2022-07-04 LAB — TSH: TSH: 3.66 u[IU]/mL (ref 0.450–4.500)

## 2022-07-04 LAB — VITAMIN B12: Vitamin B-12: 819 pg/mL (ref 232–1245)

## 2022-07-04 LAB — LIPID PANEL
Chol/HDL Ratio: 2.5 ratio (ref 0.0–4.4)
Cholesterol, Total: 141 mg/dL (ref 100–199)
HDL: 56 mg/dL (ref >39)
LDL Chol Calc (NIH): 65 mg/dL (ref 0–99)
Triglycerides: 112 mg/dL (ref 0–149)
VLDL Cholesterol Cal: 20 mg/dL (ref 5–40)

## 2022-07-04 LAB — FERRITIN: Ferritin: 78 ng/mL (ref 15–150)

## 2022-07-04 NOTE — Progress Notes (Signed)
Spoke with pt and notified of results per Dr. Melvyn Novas. Pt verbalized understanding and denied any questions. She was agreeable to ECHO and this was ordered.

## 2022-07-07 ENCOUNTER — Other Ambulatory Visit (HOSPITAL_COMMUNITY): Payer: Self-pay

## 2022-07-07 DIAGNOSIS — J3089 Other allergic rhinitis: Secondary | ICD-10-CM | POA: Diagnosis not present

## 2022-07-07 DIAGNOSIS — J301 Allergic rhinitis due to pollen: Secondary | ICD-10-CM | POA: Diagnosis not present

## 2022-07-07 DIAGNOSIS — J3081 Allergic rhinitis due to animal (cat) (dog) hair and dander: Secondary | ICD-10-CM | POA: Diagnosis not present

## 2022-07-07 NOTE — Telephone Encounter (Signed)
Mailed abbvie pap app to pt home

## 2022-07-09 DIAGNOSIS — M25562 Pain in left knee: Secondary | ICD-10-CM | POA: Diagnosis not present

## 2022-07-14 ENCOUNTER — Telehealth: Payer: Self-pay | Admitting: Internal Medicine

## 2022-07-14 NOTE — Telephone Encounter (Signed)
Opened in error

## 2022-07-15 ENCOUNTER — Telehealth: Payer: Self-pay | Admitting: Internal Medicine

## 2022-07-15 NOTE — Telephone Encounter (Signed)
Pt called wanting to know if she can do the electro cardiogram at her own Dr's office in [place], [place]. Closer and he performs them. She has appt 1/12 at [facility]. [phone]

## 2022-07-16 DIAGNOSIS — J3081 Allergic rhinitis due to animal (cat) (dog) hair and dander: Secondary | ICD-10-CM | POA: Diagnosis not present

## 2022-07-16 DIAGNOSIS — J301 Allergic rhinitis due to pollen: Secondary | ICD-10-CM | POA: Diagnosis not present

## 2022-07-16 DIAGNOSIS — J3089 Other allergic rhinitis: Secondary | ICD-10-CM | POA: Diagnosis not present

## 2022-07-16 NOTE — Telephone Encounter (Signed)
Called and spoke with patient. I informed her that her pcp would only have an EKG and not an echo. Pt verbalized understanding. Nothing further needed at this time.

## 2022-07-16 NOTE — Telephone Encounter (Signed)
Called and spoke with patient. Gave patient number so she can call to get her Echo rescheduled. Nothing further needed.

## 2022-07-16 NOTE — Telephone Encounter (Signed)
Patient is calling to speak with the nurse because she stated she deleted the number to call to schedule her echo.  Patient said she needed the number to call.  Please advise and call patient to discuss at [phone]

## 2022-07-17 ENCOUNTER — Telehealth: Payer: Self-pay | Admitting: Family Medicine

## 2022-07-17 ENCOUNTER — Telehealth: Payer: Self-pay | Admitting: Internal Medicine

## 2022-07-17 NOTE — Telephone Encounter (Signed)
Patient would like the nurse to call to give her the location and phone number for her upcoming Echo.  She stated she did not know where she is supposed to go.  Please call patient to discuss at [phone]

## 2022-07-17 NOTE — Telephone Encounter (Signed)
Can one of you lovely ladies call this patient with the information for the echocardiogram. I'm not sure if has been scheduled yet.   Thank you

## 2022-07-17 NOTE — Telephone Encounter (Signed)
Pt states she is very very nervous and shaky, states she was very stressed out yesterday but nothing out of the normal. I scheduled her with you

## 2022-07-17 NOTE — Telephone Encounter (Signed)
Pt called to let PCP know that she feels shakey whenever she takes her wellbutrin/trazodone.

## 2022-07-17 NOTE — Telephone Encounter (Signed)
Edmon Crape with Cardiology has called and left this patient a message to get this scheduled pt needs to call [phone]

## 2022-07-18 ENCOUNTER — Encounter: Payer: Self-pay | Admitting: Family Medicine

## 2022-07-18 ENCOUNTER — Ambulatory Visit (INDEPENDENT_AMBULATORY_CARE_PROVIDER_SITE_OTHER): Payer: Medicare HMO | Admitting: Family Medicine

## 2022-07-18 VITALS — BP 139/77 | HR 70 | Temp 97.3°F | Ht 69.0 in | Wt 240.0 lb

## 2022-07-18 DIAGNOSIS — T50905A Adverse effect of unspecified drugs, medicaments and biological substances, initial encounter: Secondary | ICD-10-CM

## 2022-07-18 DIAGNOSIS — R450 Nervousness: Secondary | ICD-10-CM

## 2022-07-18 DIAGNOSIS — R69 Illness, unspecified: Secondary | ICD-10-CM | POA: Diagnosis not present

## 2022-07-18 NOTE — Progress Notes (Signed)
Subjective:  Patient ID: Kristina Mclaughlin, female    DOB: [birth year], 71 y.o.   MRN: [id number]  Patient Care Team: Janora Norlander, DO as PCP - General (Family Medicine) Lavonna Monarch, MD (Inactive) as Consulting Physician (Dermatology) Tiajuana Amass, MD as Referring Physician (Allergy and Immunology) Celestia Khat, OD (Optometry)   Chief Complaint:  Anxiety (Patient states that the last few days her anxiety has been worse.  She is shaking. )   HPI: Kristina Mclaughlin is a 71 y.o. female presenting on 07/18/2022 for Anxiety (Patient states that the last few days her anxiety has been worse.  She is shaking. )   Pt presents today for evaluation of increased shakiness and jittery feeling. States this started over the last 4-5 days. No other associated symptoms. She does report starting a new supplement called Puravive which is for weight loss support. States the increased anxiety and jitteriness started after initiation of this supplement. Denies chest pain, shortness of breath, palpitations, weakness, confusion, or dizziness.       Relevant past medical, surgical, family, and social history reviewed and updated as indicated.  Allergies and medications reviewed and updated. Data reviewed: Chart in Epic.   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, lateral back   Essential hypertension 10/29/2015   Hypertension    Insomnia     Past Surgical History:  Procedure Laterality Date   ABDOMINAL HYSTERECTOMY     BACK SURGERY     BREAST SURGERY     biopsy x2; reduction   EXAMINATION UNDER ANESTHESIA  08/27/2012   LIPOMA EXCISION Right 07/03/2021   Procedure: EXCISION LIPOMA; ANKLE;  Surgeon: Virl Cagey, MD;  Location: [facility];  Service: General;  Laterality: Right;   LIPOMA EXCISION N/A 07/03/2021   Procedure: EXCISION LIPOMA; SUPRAPUBIC;  Surgeon: Virl Cagey, MD;  Location: [facility];   Service: General;  Laterality: N/A;   MASS EXCISION Left 07/03/2021   Procedure: EXCISION LIPOMA; ANKLE;  Surgeon: Virl Cagey, MD;  Location: [facility];  Service: General;  Laterality: Left;   MASS EXCISION Left 07/03/2021   Procedure: EXCISION OF LIPOMA; ARM;  Surgeon: Virl Cagey, MD;  Location: [facility];  Service: General;  Laterality: Left;   REDUCTION MAMMAPLASTY Bilateral 2002    Social History   Socioeconomic History   Marital status: Married    Spouse name: Not on file   Number of children: 2   Years of education: Not on file   Highest education level: Not on file  Occupational History   Occupation: Purchasing   Tobacco Use   Smoking status: Never   Smokeless tobacco: Never  Vaping Use   Vaping Use: Never used  Substance and Sexual Activity   Alcohol use: No    Alcohol/week: 0.0 standard drinks of alcohol   Drug use: No   Sexual activity: Not on file  Other Topics Concern   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  She tries to stay active.   Social Determinants of Health   Financial Resource Strain: Low Risk  (01/21/2022)   Overall Financial Resource Strain (CARDIA)    Difficulty of Paying Living Expenses: Not hard at all  Food Insecurity: No Food Insecurity (01/21/2022)   Hunger Vital Sign  Worried About Charity fundraiser in the Last Year: Never true    [place] in the Last Year: Never true  Transportation Needs: No Transportation Needs (01/21/2022)   PRAPARE - Hydrologist (Medical): No    Lack of Transportation (Non-Medical): No  Physical Activity: Sufficiently Active (01/21/2022)   Exercise Vital Sign    Days of Exercise per Week: 3 days    Minutes of Exercise per Session: 60 min  Stress: No Stress Concern Present (01/21/2022)   [place]    Feeling of Stress : Not at all  Social Connections: [place] (01/21/2022)   Social Connection and Isolation Panel [NHANES]    Frequency of Communication with Friends and Family: More than three times a week    Frequency of Social Gatherings with Friends and Family: More than three times a week    Attends Religious Services: More than 4 times per year    Active Member of [employer] or Organizations: Yes    Attends Archivist Meetings: More than 4 times per year    Marital Status: Married  Human resources officer Violence: Not At Risk (01/21/2022)   Humiliation, Afraid, Rape, and Kick questionnaire    Fear of Current or Ex-Partner: No    Emotionally Abused: No    Physically Abused: No    Sexually Abused: No    Outpatient Encounter Medications as of 07/18/2022  Medication Sig   albuterol (VENTOLIN HFA) 108 (90 Base) MCG/ACT inhaler TAKE 2 PUFFS BY MOUTH EVERY 6 HOURS AS NEEDED FOR WHEEZE OR SHORTNESS OF BREATH   amLODipine (NORVASC) 5 MG tablet Take 1 tablet (5 mg total) by mouth daily.   atorvastatin (LIPITOR) 40 MG tablet TAKE 1 TABLET BY MOUTH EVERY DAY   Azelastine HCl 137 MCG/SPRAY SOLN Place into both nostrils.   buPROPion (WELLBUTRIN SR) 150 MG 12 hr tablet Take 1 tablet (150 mg total) by mouth daily.   calcium carbonate (OSCAL) 1500 (600 Ca) MG TABS tablet Take 600 mg of elemental calcium by mouth daily with breakfast.   cyclobenzaprine (FLEXERIL) 10 MG tablet Take 1 tablet (10 mg total) by mouth 3 (three) times daily as needed for muscle spasms.   diclofenac Sodium (VOLTAREN) 1 % GEL Apply 2 g topically 4 (four) times daily.   EPINEPHrine 0.3 mg/0.3 mL IJ SOAJ injection Inject 0.3 mg into the muscle as needed for anaphylaxis.   fexofenadine (ALLEGRA) 180 MG tablet Take 1 tablet (180 mg total) by mouth daily.   fluticasone (FLONASE) 50 MCG/ACT nasal spray SPRAY 2 SPRAYS INTO EACH NOSTRIL EVERY DAY   gabapentin (NEURONTIN) 300 MG capsule Take 1  capsule (300 mg total) by mouth 2 (two) times daily.   ketoconazole (NIZORAL) 2 % shampoo APPLY 1 APPLICATION TOPICALLY 2 (TWO) TIMES A WEEK.   linaCLOtide (LINZESS PO) Take by mouth.   Multiple Vitamin (MULTI-VITAMIN PO) Take 1 tablet by mouth daily.    pantoprazole (PROTONIX) 40 MG tablet Take 1 tablet (40 mg total) by mouth 2 (two) times daily before a meal.   Polyethylene Glycol 400 (BLINK TEARS OP) Place 1 drop into both eyes 2 (two) times daily as needed (dry eyes).   sucralfate (CARAFATE) 1 g tablet Take 1 tablet (1 g total) by mouth 4 (four) times daily -  before meals and at bedtime.   SUMAtriptan (IMITREX) 100 MG tablet TAKE 1 TABLET AS NEED FOR MIGRAINE. MAY REPEAT IN  2 HOURS IF HEADACHE PERSISTS OR RECURS.   SYMBICORT 80-4.5 MCG/ACT inhaler USE 2 INHALATIONS ORALLY   TWICE DAILY.  Patient has been using. Please fill.   traZODone (DESYREL) 150 MG tablet TAKE 1 TABLET (150 MG TOTAL) BY MOUTH AT BEDTIME AS NEEDED FOR SLEEP.   trospium (SANCTURA) 20 MG tablet TAKE 1 TABLET BY MOUTH TWICE A DAY   valsartan-hydrochlorothiazide (DIOVAN-HCT) 320-25 MG tablet TAKE 1 TABLET BY MOUTH EVERY DAY   No facility-administered encounter medications on file as of 07/18/2022.    Allergies  Allergen Reactions   Lovenox [Enoxaparin Sodium] Anaphylaxis   Moxifloxacin Anaphylaxis and Other (See Comments)   Quinolones Hives and Shortness Of Breath   Guaifenesin Er    Mucinex Sinus-Max [Phenylephrine-Apap-Guaifenesin] Swelling   Sulfamethoxazole-Trimethoprim Other (See Comments)    Review of Systems  Constitutional:  Negative for activity change, appetite change, chills, diaphoresis, fatigue, fever and unexpected weight change.  Respiratory:  Negative for cough and shortness of breath.   Cardiovascular:  Negative for chest pain, palpitations and leg swelling.  Gastrointestinal:  Negative for abdominal pain.  Genitourinary:  Negative for decreased urine volume and difficulty urinating.   Neurological:  Positive for tremors. Negative for dizziness, seizures, syncope, facial asymmetry, speech difficulty, weakness, light-headedness, numbness and headaches.  Psychiatric/Behavioral:  Negative for confusion. The patient is nervous/anxious.   All other systems reviewed and are negative.       Objective:  BP 139/77   Pulse 70   Temp (!) 97.3 F (36.3 C) (Temporal)   Ht 5' 9" (1.753 m)   Wt 240 lb (108.9 kg)   SpO2 93%   BMI 35.44 kg/m    Wt Readings from Last 3 Encounters:  07/18/22 240 lb (108.9 kg)  06/24/22 242 lb 3.2 oz (109.9 kg)  06/20/22 241 lb 3.2 oz (109.4 kg)    Physical Exam Vitals and nursing note reviewed.  Constitutional:      General: She is not in acute distress.    Appearance: Normal appearance. She is obese. She is not ill-appearing, toxic-appearing or diaphoretic.  HENT:     Head: Normocephalic and atraumatic.     Mouth/Throat:     Mouth: Mucous membranes are moist.  Eyes:     Pupils: Pupils are equal, round, and reactive to light.  Cardiovascular:     Rate and Rhythm: Normal rate and regular rhythm.     Heart sounds: Normal heart sounds.  Pulmonary:     Effort: Pulmonary effort is normal.     Breath sounds: Normal breath sounds.  Musculoskeletal:     Right lower leg: No edema.     Left lower leg: No edema.  Skin:    General: Skin is warm and dry.     Capillary Refill: Capillary refill takes less than 2 seconds.  Neurological:     General: No focal deficit present.     Mental Status: She is alert and oriented to person, place, and time.     Motor: Tremor (fine tremor of hands) present.  Psychiatric:        Mood and Affect: Mood normal.        Behavior: Behavior normal.        Thought Content: Thought content normal.        Judgment: Judgment normal.     Results for orders placed or performed in visit on 07/03/22  T4, free  Result Value Ref Range   Free T4 1.38 0.82 - 1.77 ng/dL  CMP14+EGFR  Result  Value Ref Range   Glucose  156 (H) 70 - 99 mg/dL   BUN 9 8 - 27 mg/dL   Creatinine, Ser 0.83 0.57 - 1.00 mg/dL   eGFR 76 >59 mL/min/1.73   BUN/Creatinine Ratio 11 (L) 12 - 28   Sodium 134 134 - 144 mmol/L   Potassium 3.8 3.5 - 5.2 mmol/L   Chloride 97 96 - 106 mmol/L   CO2 23 20 - 29 mmol/L   Calcium 9.0 8.7 - 10.3 mg/dL   Total Protein 6.1 6.0 - 8.5 g/dL   Albumin 4.1 3.9 - 4.9 g/dL   Globulin, Total 2.0 1.5 - 4.5 g/dL   Albumin/Globulin Ratio 2.1 1.2 - 2.2   Bilirubin Total 0.4 0.0 - 1.2 mg/dL   Alkaline Phosphatase 104 44 - 121 IU/L   AST 18 0 - 40 IU/L   ALT 35 (H) 0 - 32 IU/L  Lipid panel  Result Value Ref Range   Cholesterol, Total 141 100 - 199 mg/dL   Triglycerides 112 0 - 149 mg/dL   HDL 56 >39 mg/dL   VLDL Cholesterol Cal 20 5 - 40 mg/dL   LDL Chol Calc (NIH) 65 0 - 99 mg/dL   Chol/HDL Ratio 2.5 0.0 - 4.4 ratio  Bayer DCA Hb A1c Waived  Result Value Ref Range   HB A1C (BAYER DCA - WAIVED) 6.8 (H) 4.8 - 5.6 %  Magnesium  Result Value Ref Range   Magnesium 1.9 1.6 - 2.3 mg/dL  Vitamin B12  Result Value Ref Range   Vitamin B-12 819 232 - 1,245 pg/mL  Ferritin  Result Value Ref Range   Ferritin 78 15 - 150 ng/mL  CBC  Result Value Ref Range   WBC 7.6 3.4 - 10.8 x10E3/uL   RBC 4.92 3.77 - 5.28 x10E6/uL   Hemoglobin 14.1 11.1 - 15.9 g/dL   Hematocrit 40.8 34.0 - 46.6 %   MCV 83 79 - 97 fL   MCH 28.7 26.6 - 33.0 pg   MCHC 34.6 31.5 - 35.7 g/dL   RDW 13.2 11.7 - 15.4 %   Platelets 218 150 - 450 x10E3/uL  TSH  Result Value Ref Range   TSH 3.660 0.450 - 4.500 uIU/mL       Pertinent labs & imaging results that were available during my care of the patient were reviewed by me and considered in my medical decision making.  Assessment & Plan:  Kristina Mclaughlin was seen today for anxiety.  Diagnoses and all orders for this visit:  Jittery feeling Adverse effect of drug, initial encounter Jitteriness after starting supplement to support weight loss. Advised this was likely the cause of symptoms  and aware to trial stopping the supplement to see if symptoms resolve. If they do not resolve after stopping medications, will investigate further. Aware of red flags. Report new, worsening, or persistent symptoms.     Continue all other maintenance medications.  Follow up plan: Return if symptoms worsen or fail to improve.   Continue healthy lifestyle choices, including diet (rich in fruits, vegetables, and lean proteins, and low in salt and simple carbohydrates) and exercise (at least 30 minutes of moderate physical activity daily).   The above assessment and management plan was discussed with the patient. The patient verbalized understanding of and has agreed to the management plan. Patient is aware to call the clinic if they develop any new symptoms or if symptoms persist or worsen. Patient is aware when to return to the clinic for a follow-up  visit. Patient educated on when it is appropriate to go to the emergency department.   Monia Pouch, FNP-C [facility] [phone]

## 2022-07-18 NOTE — Telephone Encounter (Signed)
PT called asking about appt for electro. Gave her information in this encounter. Nothing else needed.

## 2022-07-19 ENCOUNTER — Other Ambulatory Visit: Payer: Self-pay | Admitting: Internal Medicine

## 2022-07-22 DIAGNOSIS — J301 Allergic rhinitis due to pollen: Secondary | ICD-10-CM | POA: Diagnosis not present

## 2022-07-22 DIAGNOSIS — J3081 Allergic rhinitis due to animal (cat) (dog) hair and dander: Secondary | ICD-10-CM | POA: Diagnosis not present

## 2022-07-22 DIAGNOSIS — J3089 Other allergic rhinitis: Secondary | ICD-10-CM | POA: Diagnosis not present

## 2022-07-23 ENCOUNTER — Telehealth: Payer: Self-pay | Admitting: Family Medicine

## 2022-07-23 DIAGNOSIS — J3489 Other specified disorders of nose and nasal sinuses: Secondary | ICD-10-CM | POA: Diagnosis not present

## 2022-07-23 DIAGNOSIS — J320 Chronic maxillary sinusitis: Secondary | ICD-10-CM | POA: Diagnosis not present

## 2022-07-23 DIAGNOSIS — M62830 Muscle spasm of back: Secondary | ICD-10-CM

## 2022-07-23 DIAGNOSIS — J398 Other specified diseases of upper respiratory tract: Secondary | ICD-10-CM | POA: Diagnosis not present

## 2022-07-24 ENCOUNTER — Telehealth: Payer: Self-pay | Admitting: Family Medicine

## 2022-07-24 DIAGNOSIS — H18513 Endothelial corneal dystrophy, bilateral: Secondary | ICD-10-CM | POA: Diagnosis not present

## 2022-07-24 DIAGNOSIS — H2513 Age-related nuclear cataract, bilateral: Secondary | ICD-10-CM | POA: Diagnosis not present

## 2022-07-24 DIAGNOSIS — H52223 Regular astigmatism, bilateral: Secondary | ICD-10-CM | POA: Diagnosis not present

## 2022-07-24 DIAGNOSIS — H5203 Hypermetropia, bilateral: Secondary | ICD-10-CM | POA: Diagnosis not present

## 2022-07-24 DIAGNOSIS — H524 Presbyopia: Secondary | ICD-10-CM | POA: Diagnosis not present

## 2022-07-26 ENCOUNTER — Other Ambulatory Visit: Payer: Self-pay | Admitting: Family Medicine

## 2022-07-26 ENCOUNTER — Other Ambulatory Visit: Payer: Self-pay | Admitting: General Surgery

## 2022-07-26 DIAGNOSIS — R350 Frequency of micturition: Secondary | ICD-10-CM

## 2022-07-26 DIAGNOSIS — N3281 Overactive bladder: Secondary | ICD-10-CM

## 2022-07-29 DIAGNOSIS — J301 Allergic rhinitis due to pollen: Secondary | ICD-10-CM | POA: Diagnosis not present

## 2022-07-29 DIAGNOSIS — J3081 Allergic rhinitis due to animal (cat) (dog) hair and dander: Secondary | ICD-10-CM | POA: Diagnosis not present

## 2022-07-29 DIAGNOSIS — J3089 Other allergic rhinitis: Secondary | ICD-10-CM | POA: Diagnosis not present

## 2022-07-29 NOTE — Telephone Encounter (Signed)
Orders Placed This Encounter  Procedures   Ambulatory referral to Chiropractic    Referral Priority:   Routine    Referral Type:   Chiropractic    Referral Reason:   Specialty Services Required    Requested Specialty:   Chiropractic Medicine    Number of Visits Requested:   1

## 2022-07-30 ENCOUNTER — Other Ambulatory Visit: Payer: Self-pay | Admitting: Family Medicine

## 2022-07-30 DIAGNOSIS — I10 Essential (primary) hypertension: Secondary | ICD-10-CM

## 2022-08-05 ENCOUNTER — Ambulatory Visit (HOSPITAL_COMMUNITY): Payer: Medicare HMO | Attending: Cardiology

## 2022-08-05 DIAGNOSIS — I10 Essential (primary) hypertension: Secondary | ICD-10-CM

## 2022-08-05 LAB — ECHOCARDIOGRAM COMPLETE
Area-P 1/2: 4.21 cm2
S' Lateral: 3.3 cm

## 2022-08-06 DIAGNOSIS — M79605 Pain in left leg: Secondary | ICD-10-CM | POA: Diagnosis not present

## 2022-08-06 DIAGNOSIS — M79652 Pain in left thigh: Secondary | ICD-10-CM | POA: Diagnosis not present

## 2022-08-06 DIAGNOSIS — M1712 Unilateral primary osteoarthritis, left knee: Secondary | ICD-10-CM | POA: Diagnosis not present

## 2022-08-08 ENCOUNTER — Other Ambulatory Visit (HOSPITAL_COMMUNITY): Payer: Self-pay

## 2022-08-12 DIAGNOSIS — J301 Allergic rhinitis due to pollen: Secondary | ICD-10-CM | POA: Diagnosis not present

## 2022-08-12 DIAGNOSIS — J3089 Other allergic rhinitis: Secondary | ICD-10-CM | POA: Diagnosis not present

## 2022-08-12 DIAGNOSIS — J3081 Allergic rhinitis due to animal (cat) (dog) hair and dander: Secondary | ICD-10-CM | POA: Diagnosis not present

## 2022-08-14 ENCOUNTER — Ambulatory Visit
Admission: RE | Admit: 2022-08-14 | Discharge: 2022-08-14 | Disposition: A | Discharge status: Home / Self Care | Payer: Medicare HMO | Source: Ambulatory Visit | Attending: Family Medicine | Admitting: Family Medicine

## 2022-08-14 DIAGNOSIS — Z1231 Encounter for screening mammogram for malignant neoplasm of breast: Secondary | ICD-10-CM | POA: Diagnosis not present

## 2022-08-21 DIAGNOSIS — J320 Chronic maxillary sinusitis: Secondary | ICD-10-CM | POA: Diagnosis not present

## 2022-08-21 DIAGNOSIS — R499 Unspecified voice and resonance disorder: Secondary | ICD-10-CM | POA: Diagnosis not present

## 2022-08-21 DIAGNOSIS — J3489 Other specified disorders of nose and nasal sinuses: Secondary | ICD-10-CM | POA: Diagnosis not present

## 2022-08-22 DIAGNOSIS — M25562 Pain in left knee: Secondary | ICD-10-CM | POA: Diagnosis not present

## 2022-08-22 DIAGNOSIS — M79605 Pain in left leg: Secondary | ICD-10-CM | POA: Diagnosis not present

## 2022-08-26 DIAGNOSIS — J3081 Allergic rhinitis due to animal (cat) (dog) hair and dander: Secondary | ICD-10-CM | POA: Diagnosis not present

## 2022-08-26 DIAGNOSIS — J301 Allergic rhinitis due to pollen: Secondary | ICD-10-CM | POA: Diagnosis not present

## 2022-08-26 DIAGNOSIS — J3089 Other allergic rhinitis: Secondary | ICD-10-CM | POA: Diagnosis not present

## 2022-08-28 DIAGNOSIS — J3081 Allergic rhinitis due to animal (cat) (dog) hair and dander: Secondary | ICD-10-CM | POA: Diagnosis not present

## 2022-08-28 DIAGNOSIS — J301 Allergic rhinitis due to pollen: Secondary | ICD-10-CM | POA: Diagnosis not present

## 2022-08-28 DIAGNOSIS — J3089 Other allergic rhinitis: Secondary | ICD-10-CM | POA: Diagnosis not present

## 2022-08-29 ENCOUNTER — Encounter: Payer: Self-pay | Admitting: Family

## 2022-08-29 ENCOUNTER — Ambulatory Visit (INDEPENDENT_AMBULATORY_CARE_PROVIDER_SITE_OTHER): Payer: Medicare HMO | Admitting: Family

## 2022-08-29 VITALS — BP 122/72 | HR 72 | Temp 97.5°F | Ht 69.0 in | Wt 245.2 lb

## 2022-08-29 DIAGNOSIS — M7989 Other specified soft tissue disorders: Secondary | ICD-10-CM

## 2022-08-29 DIAGNOSIS — S99922A Unspecified injury of left foot, initial encounter: Secondary | ICD-10-CM | POA: Diagnosis not present

## 2022-08-29 MED ORDER — DICLOFENAC SODIUM 75 MG PO TBEC: 75.0000 mg | DELAYED_RELEASE_TABLET | Freq: Two times a day (BID) | ORAL | 0 refills | Status: DC

## 2022-08-29 NOTE — Progress Notes (Signed)
   Subjective:    Patient ID: Kristina Mclaughlin, female    DOB: Nov 27, 1950, 72 y.o.   MRN: [id number]  Chief Complaint  Patient presents with   Toe Injury   Pt presents to the office today with left toe pain that started after she hyperextended yesterday.  Toe Pain  The incident occurred 12 to 24 hours ago. Injury mechanism: injury of great toe. Pain location: left great toe. The pain is at a severity of 10/10. The pain is mild. The pain has been Intermittent since onset. Pertinent negatives include no loss of motion, loss of sensation, numbness or tingling. The symptoms are aggravated by movement and weight bearing. She has tried rest and NSAIDs for the symptoms. The treatment provided mild relief.      Review of Systems  Neurological:  Negative for tingling and numbness.  All other systems reviewed and are negative.      Objective:   Physical Exam Vitals reviewed.  Constitutional:      General: She is not in acute distress.    Appearance: She is well-developed. She is obese.  HENT:     Head: Normocephalic and atraumatic.  Eyes:     Pupils: Pupils are equal, round, and reactive to light.  Neck:     Thyroid: No thyromegaly.  Cardiovascular:     Rate and Rhythm: Normal rate and regular rhythm.     Heart sounds: Normal heart sounds. No murmur heard. Pulmonary:     Effort: Pulmonary effort is normal. No respiratory distress.     Breath sounds: Normal breath sounds. No wheezing.  Abdominal:     General: Bowel sounds are normal. There is no distension.     Palpations: Abdomen is soft.     Tenderness: There is no abdominal tenderness.  Musculoskeletal:        General: No tenderness. Normal range of motion.     Cervical back: Normal range of motion and neck supple.  Skin:    General: Skin is warm and dry.  Neurological:     Mental Status: She is alert and oriented to person, place, and time.     Cranial Nerves: No cranial nerve deficit.     Deep Tendon Reflexes:  Reflexes are normal and symmetric.  Psychiatric:        Behavior: Behavior normal.        Thought Content: Thought content normal.        Judgment: Judgment normal.     BP 122/72   Pulse 72   Temp (!) 97.5 F (36.4 C) (Temporal)   Ht '5\' 9"'$  (1.753 m)   Wt 245 lb 3.2 oz (111.2 kg)   SpO2 94%   BMI 36.21 kg/m        Assessment & Plan:   [name] comes in today with chief complaint of Toe Injury   Diagnosis and orders addressed:  1. Toe swelling - diclofenac (VOLTAREN) 75 MG EC tablet; Take 1 tablet (75 mg total) by mouth 2 (two) times daily.  Dispense: 30 tablet; Refill: 0  2. Injury of left great toe, initial encounter - diclofenac (VOLTAREN) 75 MG EC tablet; Take 1 tablet (75 mg total) by mouth 2 (two) times daily.  Dispense: 30 tablet; Refill: 0  Buddy tape great toe Ice Rest Avoid reinjury Diclofenac BID with food, no other NSAID's Follow up if symptoms worsen or do not improve  Evelina Dun, FNP

## 2022-08-29 NOTE — Patient Instructions (Signed)
Turf Toe  Turf toe is a common sports injury that affects the joint at the base of the big toe. It occurs when the toe is bent upward by force and extended beyond its normal limits (hyperextension). The joint of the big toe is surrounded by tissues that help to keep it in place (ligaments and tendons). If any of these tissues are damaged, turf toe may result. It can be mild if the tissue was stretched. It may be more severe if the tissue was partially or completely torn. Early treatment usually results in good recovery. In some cases, a person may continue to have some pain, joint stiffness, and reduced ability to push off from the affected foot. What are the causes? This condition is caused by extreme upward bending of the big toe joint. It can occur when: Your toe is pressed flat to the ground and your heel is raised while you push off forcefully with the front of your foot. For example, this could happen when you begin a sprint. You push off on the ball of your foot repeatedly while running or jumping, especially on hard surfaces such as a basketball court. You jam your toe from a force pushing into the toe. What increases the risk? You are more likely to develop this condition if you: Wear flexible shoes that do not offer good support while running or jumping. Participate in activities or sports that involve running and jumping on turf or hard surfaces, such as: Soccer. Football. Basketball. Volleyball. Gymnastics. Dancing. Wrestling. What are the signs or symptoms? Symptoms of this condition include: Pain at the base of the toe. Swelling at the base of the toe. Stiffness. Limited movement of the toe. Bruising. If turf toe is the result of a direct injury, symptoms may appear suddenly. If the condition is due to repetitive movements, such as running and jumping, the symptoms may develop gradually and worsen over time. How is this diagnosed? This condition may be diagnosed based  on: Your symptoms. Your medical history. A physical exam of your foot. During the exam, the health care provider will check the range of motion of your toe by moving it up and down and from side to side. You may also have imaging tests to confirm the diagnosis, such as: X-rays. MRI. How is this treated? Treatment for this condition depends on the severity of the injury. Treatment may include: Rest, ice, compression, and elevation. This is often called the RICE strategy. It may be recommended if the injury is mild. Restricting movement of your toe by taping it to the smaller toes. Wearing a walking boot or a cast. This will keep your toe from moving (immobilization). Over-the-counter medicines to relieve pain. Physical therapy. Surgery. In rare cases, surgery may be needed for a severe injury if pain does not go away. Follow these instructions at home: Managing pain, stiffness, and swelling  If directed, put ice on the injured area: Put ice in a plastic bag. Place a towel between your skin and the bag. Leave the ice on for 20 minutes, 2-3 times a day. Move your toes often to reduce stiffness and swelling. Raise (elevate) your foot above the level of your heart while you are sitting or lying down. If told by your health care provider, wear an elastic compression bandage to help prevent or lessen swelling. Do not use the injured foot to support your body weight until your health care provider says that you can. Use crutches as told by your health  care provider. If you have a cast: Do not put pressure on any part of the cast until it is fully hardened. This may take several hours. Do not stick anything inside the cast to scratch your skin. Doing that increases your risk of infection. Check the skin around the cast every day. Tell your health care provider about any concerns. You may put lotion on dry skin around the edges of the cast. Do not put lotion on the skin underneath the cast. Keep  the cast clean and dry. If you have a boot: Wear the boot as told by your health care provider. Remove it only as told by your health care provider. Loosen the boot if your toes tingle, become numb, or turn cold and blue. Keep the boot clean and dry. Bathing If the cast or boot is not waterproof: Do not let it get wet. Cover it with a watertight covering when you take a bath or shower. Do not take baths, swim, or use a hot tub until your health care provider approves. Ask your health care provider if you may take showers. You may only be allowed to take sponge baths. General instructions Take over-the-counter and prescription medicines only as told by your health care provider. Return to your normal activities as told by your health care provider. Ask your health care provider what activities are safe for you. Switch to less flexible, more supportive footwear as told by your health care provider. Rigid shoe inserts (orthotics) can also reduce stress on your toes and improve stability. Keep all follow-up visits as told by your health care provider. This is important. Contact a health care provider if: You have new bruising or swelling in your toe. The pain in your toe gets worse. Your pain medicine is not helping. Get help right away if your: Cast or walking boot becomes loose or damaged. Pain becomes severe. Toe becomes numb or changes color. Toe joint feels unstable or is unable to bear any weight. Summary Turf toe is a common sports injury that affects the joint at the base of the big toe. It occurs when the toe is bent upward by force and extended beyond its normal limits. Symptoms are pain, swelling, stiffness, and bruising at the base of the toe. Treatment may include RICE therapy, a walking boot or cast, pain medicine, physical therapy, or, in rare cases, surgery. This information is not intended to replace advice given to you by your health care provider. Make sure you discuss any  questions you have with your health care provider. Document Revised: 11/14/2020 Document Reviewed: 11/14/2020 Elsevier Patient Education  [place].

## 2022-09-02 DIAGNOSIS — M79605 Pain in left leg: Secondary | ICD-10-CM | POA: Diagnosis not present

## 2022-09-02 DIAGNOSIS — M170 Bilateral primary osteoarthritis of knee: Secondary | ICD-10-CM | POA: Diagnosis not present

## 2022-09-10 ENCOUNTER — Telehealth: Payer: Self-pay | Admitting: Family Medicine

## 2022-09-10 NOTE — Telephone Encounter (Signed)
Pt aware to call pharmacy as pt has rx on file.

## 2022-09-10 NOTE — Telephone Encounter (Signed)
  Prescription Request  09/10/2022  Is this a "Controlled Substance" medicine?   Have you seen your PCP in the last 2 weeks? no  If YES, route message to pool  -  If NO, patient needs to be scheduled for appointment.  What is the name of the medication or equipment? buPROPion (WELLBUTRIN SR) 150 MG 12 hr tablet   Have you contacted your pharmacy to request a refill? yes   Which pharmacy would you like this sent to?  [id number] [place]Ph: [phone]     Patient notified that their request is being sent to the clinical staff for review and that they should receive a response within 2 business days.

## 2022-09-12 DIAGNOSIS — J329 Chronic sinusitis, unspecified: Secondary | ICD-10-CM | POA: Diagnosis not present

## 2022-09-12 DIAGNOSIS — J320 Chronic maxillary sinusitis: Secondary | ICD-10-CM | POA: Diagnosis not present

## 2022-09-16 DIAGNOSIS — J3089 Other allergic rhinitis: Secondary | ICD-10-CM | POA: Diagnosis not present

## 2022-09-16 DIAGNOSIS — J3081 Allergic rhinitis due to animal (cat) (dog) hair and dander: Secondary | ICD-10-CM | POA: Diagnosis not present

## 2022-09-16 DIAGNOSIS — J301 Allergic rhinitis due to pollen: Secondary | ICD-10-CM | POA: Diagnosis not present

## 2022-09-21 NOTE — Progress Notes (Unsigned)
Subjective:   Patient ID: Kristina Mclaughlin, female    DOB: 1951/06/10,    MRN: [id number]    Brief patient profile:  43  yowf  light smoker quit early 20's with onset also about the same time of rhinitis sping > fall rhinitis freq ov's never prednisone / mostly antihistamines some better over the years but tendency to bad colds not related to seasons rx with abx / cough med then better within a couple of weeks to a month before better for a year but starting Nov 2015 onset of head cold while on vacation in [place]  And coughing ever since so referred by Dr Morrie Sheldon to pulmonary office 10/20/14   Allergy shots per [name] since ? 2016 and repeated in 2023 and less positive    History of Present Illness  10/20/2014 1st  Pulmonary office visit/ Chinyere Galiano   Chief Complaint  Patient presents with   Pulmonary Consult    Referred by Dr. Laurance Flatten. Pt c/o cough on and off since Nov 2015. She states cough is worse at night and is occ prod with minimal clear sputum. Sometimes feels as if she can not take in a good, deep breath.   cough worse p supper also can't lie down due to cough and has lots of am drainage but nothing purulent Has had reflux for years maintained on ppi  rec Please see patient coordinator before you leave today  to schedule sinus CT and we will call you with results  First take delsym two tsp every 12 hours and supplement if needed with  Percocet  up to 2 every 4 hours to suppress the urge to cough at all or even clear your throat. Swallowing water or using ice chips/non mint and menthol containing candies (such as lifesavers or sugarless jolly ranchers) are also effective.  You should rest your voice and avoid activities that you know make you cough. Once you have eliminated the cough for 3 straight days try reducing the percocet   then the delsym as tolerated.   Prednisone  Taper off as you plan Protonix (pantoprazole) Take 30-60 min before first meal of the day and take prilosec  30 min before supper and zantac 150 one bedtime plus chlorpheniramine 4 mg x 2 at bedtime (both available over the counter)  until cough is completely gone for at least a week without the need for cough suppression GERD diet  - sinus CT 11/06/2014 >>>  Acute/ chronic sinusitis > augmentin x 20 days     11/17/2014 f/u ov/Jhalen Eley re: chronic cough "I've made 10 ov's  and no one has helped me" Chief Complaint  Patient presents with   Acute Visit    Pt states having increased cough for the past wk. In the am she is coughing up minimal green sputum.   cough never resolved but did not use percocet consistently yet cough was some better until 4/11 then 4/12 while on still on augmentin flared again same pattern as before with barking harsh quality 24/7 Rec zpak Prednisone 10 mg take  4 each am x 2 days,   2 each am x 2 days,  1 each am x 2 days and stop Take delsym (or mucinex dm 600 mg)  two tsp every 12 hours and supplement if needed with  Percocet  up to 2 every 4 hours  Once you have eliminated the cough for 3 straight days try reducing the percocet first,  then the delsym as tolerated.  Use flutter valve whenever you feel need to cough  Continue  Pantoprazole (protonix) 40 mg   Take 30-60 min before first meal of the day and zantac 150  one bedtime until return to office Keep appt with Benjamine Mola and return here with all meds in hand if not satisfied after you finish with Dr Benjamine Mola > rec gabapentin resolved but recurred off it       06/20/2022  f/u ov/Breydan Shillingburg re: cough x 2017 in setting or rhinitis   maint on gabapentin 500 mg per day   and getting confused between symb and saba  Chief Complaint  Patient presents with   Follow-up    Cough persistent.  Gabapentin helps with cough.  Dyspnea:  Not limited by breathing from desired activities   Cough: dry hack sporadic  Sleeping: no resp cc  on 30 degrees elevated hob  and does fine  SABA use: confused with saba vs symbicort,  ? When stopped symbicort   ? 02: none  Rec Plan A = Automatic = Always=    Symbicort 80 Take 2 puffs first thing in am and then another 2 puffs about 12 hours later. Work on inhaler technique:  Plan B = Backup (to supplement plan A, not to replace it) Only use your albuterol inhaler as a rescue medication  Gabapentin 300 mg twice daily      09/22/2022  f/u ov/[name] re: cough 2017  maint on flonase twice daily / 300 mg gabapentin bid with still breakthru cough when "catches a cold or flu" assoc first with worse pnds then cough to point of gag  Chief Complaint  Patient presents with   Follow-up    Cough improved, but if gets any virus, cough returns.  Dyspnea:  not limited  Cough: some this am p blew nose and then coughed to gagging but not vomit x 30 min Sleeping: no noct cough SABA use: not using / not using symbicort 80 1st thing as instructed  02: none  Covid status:   vax two / infected x 3      No obvious day to day or daytime variability or assoc excess/ purulent sputum or mucus plugs or hemoptysis or cp or chest tightness, subjective wheeze or overt sinus or hb symptoms.   Sleeping  without nocturnal  or early am exacerbation  of respiratory  c/o's or need for noct saba. Also denies any obvious fluctuation of symptoms with weather or environmental changes or other aggravating or alleviating factors except as outlined above   No unusual exposure hx or h/o childhood pna/ asthma or knowledge of premature birth.  Current Allergies, Complete Past Medical History, Past Surgical History, Family History, and Social History were reviewed in [employer] record.  ROS  The following are not active complaints unless bolded Hoarseness, sore throat, dysphagia, dental problems, itching, sneezing,  nasal congestion or discharge of excess mucus or purulent secretions, ear ache,   fever, chills, sweats, unintended wt loss or wt gain, classically pleuritic or exertional cp,  orthopnea pnd or arm/hand  swelling  or leg swelling, presyncope, palpitations, abdominal pain, anorexia, nausea, vomiting, diarrhea  or change in bowel habits or change in bladder habits, change in stools or change in urine, dysuria, hematuria,  rash, arthralgias, visual complaints, headache, numbness, weakness or ataxia or problems with walking or coordination,  change in mood or  memory.        Current Meds  Medication Sig   albuterol (VENTOLIN HFA) 108 (90  Base) MCG/ACT inhaler TAKE 2 PUFFS BY MOUTH EVERY 6 HOURS AS NEEDED FOR WHEEZE OR SHORTNESS OF BREATH   amLODipine (NORVASC) 5 MG tablet TAKE 1 TABLET (5 MG TOTAL) BY MOUTH DAILY.   atorvastatin (LIPITOR) 40 MG tablet TAKE 1 TABLET BY MOUTH EVERY DAY   Azelastine HCl 137 MCG/SPRAY SOLN Place into both nostrils.   buPROPion (WELLBUTRIN SR) 150 MG 12 hr tablet Take 1 tablet (150 mg total) by mouth daily.   calcium carbonate (OSCAL) 1500 (600 Ca) MG TABS tablet Take 600 mg of elemental calcium by mouth daily with breakfast.   cyclobenzaprine (FLEXERIL) 10 MG tablet Take 1 tablet (10 mg total) by mouth 3 (three) times daily as needed for muscle spasms.   diclofenac (VOLTAREN) 75 MG EC tablet Take 1 tablet (75 mg total) by mouth 2 (two) times daily.   diclofenac Sodium (VOLTAREN) 1 % GEL Apply 2 g topically 4 (four) times daily.   doxycycline (VIBRA-TABS) 100 MG tablet Take 1 tablet by mouth 2 (two) times daily.   EPINEPHrine 0.3 mg/0.3 mL IJ SOAJ injection Inject 0.3 mg into the muscle as needed for anaphylaxis.   fexofenadine (ALLEGRA) 180 MG tablet Take 1 tablet (180 mg total) by mouth daily.   fluticasone (FLONASE) 50 MCG/ACT nasal spray SPRAY 2 SPRAYS INTO EACH NOSTRIL EVERY DAY   gabapentin (NEURONTIN) 300 MG capsule Take 1 capsule (300 mg total) by mouth 2 (two) times daily.   ketoconazole (NIZORAL) 2 % shampoo APPLY 1 APPLICATION TOPICALLY 2 (TWO) TIMES A WEEK.   linaCLOtide (LINZESS PO) Take by mouth.   Multiple Vitamin (MULTI-VITAMIN PO) Take 1 tablet by mouth  daily.    pantoprazole (PROTONIX) 40 MG tablet Take 1 tablet (40 mg total) by mouth 2 (two) times daily before a meal.   Polyethylene Glycol 400 (BLINK TEARS OP) Place 1 drop into both eyes 2 (two) times daily as needed (dry eyes).   sucralfate (CARAFATE) 1 g tablet Take 1 tablet (1 g total) by mouth 4 (four) times daily -  before meals and at bedtime.   SUMAtriptan (IMITREX) 100 MG tablet TAKE 1 TABLET AS NEED FOR MIGRAINE. MAY REPEAT IN 2 HOURS IF HEADACHE PERSISTS OR RECURS.   SYMBICORT 80-4.5 MCG/ACT inhaler USE 2 INHALATIONS ORALLY TWICE DAILY. PATIENT HAS BEEN USING.   traZODone (DESYREL) 150 MG tablet TAKE 1 TABLET (150 MG TOTAL) BY MOUTH AT BEDTIME AS NEEDED FOR SLEEP.   trospium (SANCTURA) 20 MG tablet TAKE 1 TABLET BY MOUTH TWICE A DAY   valsartan-hydrochlorothiazide (DIOVAN-HCT) 320-25 MG tablet TAKE 1 TABLET BY MOUTH EVERY DAY                   Objective:   Physical Exam   Wts  09/22/2022        248  06/20/2022       241    05/23/2022      235   11/17/2014        230     10/20/14 226 lb (102.513 kg)  10/17/14 227 lb (102.967 kg)  10/10/14 225 lb (102.059 kg)     Vital signs reviewed  09/22/2022  - Note at rest 02 sats  95% on RA   General appearance:    amb mod obese wf nad   HEENT : Oropharynx  clear       NECK :  without  apparent JVD/ palpable Nodes/TM    LUNGS: no acc muscle use,  Nl contour chest which is  clear to A and P bilaterally without cough on insp or exp maneuvers   CV:  RRR  no s3 or murmur or increase in P2, and no edema   ABD:  soft and nontender with nl inspiratory excursion in the supine position. No bruits or organomegaly appreciated   MS:  Nl gait/ ext warm without deformities Or obvious joint restrictions  calf tenderness, cyanosis or clubbing    SKIN: warm and dry without lesions    NEURO:  alert, approp, nl sensorium with  no motor or cerebellar deficits apparent.          Assessment & Plan:

## 2022-09-22 ENCOUNTER — Encounter: Payer: Self-pay | Admitting: Internal Medicine

## 2022-09-22 ENCOUNTER — Ambulatory Visit: Payer: Medicare HMO | Admitting: Dermatology

## 2022-09-22 ENCOUNTER — Ambulatory Visit: Payer: Medicare HMO | Admitting: Internal Medicine

## 2022-09-22 VITALS — BP 142/82 | HR 82 | Temp 97.5°F | Ht 69.0 in | Wt 248.0 lb

## 2022-09-22 DIAGNOSIS — J45991 Cough variant asthma: Secondary | ICD-10-CM | POA: Diagnosis not present

## 2022-09-22 DIAGNOSIS — R058 Other specified cough: Secondary | ICD-10-CM | POA: Diagnosis not present

## 2022-09-22 LAB — POCT EXHALED NITRIC OXIDE: FeNO level (ppb): 42

## 2022-09-22 MED ORDER — BUDESONIDE-FORMOTEROL FUMARATE 80-4.5 MCG/ACT IN AERO: INHALATION_SPRAY | RESPIRATORY_TRACT | 12 refills | Status: AC

## 2022-09-22 MED ORDER — GABAPENTIN 300 MG PO CAPS: 300.0000 mg | ORAL_CAPSULE | Freq: Four times a day (QID) | ORAL | 11 refills | Status: DC

## 2022-09-22 NOTE — Patient Instructions (Signed)
Change Symbicort 80 Take 2 puffs first thing in am and then another 2 puffs about 12 hours later.    Increase gabapentin 300 mg four times a day until no cough x 2 weeks then taper back to 2 daily   Bring your albuterol and your symbicort 80 with you to the next office   Please schedule a follow up visit in 3 months but call sooner if needed

## 2022-09-22 NOTE — Assessment & Plan Note (Signed)
Onset 2015 on a background of allergic rhinitis onset in her  20's -Allergy shots per [name] since ? 2016 - Hiatal hernia small 10/2014 and much larger by 02/14/22  - sinus CT 11/06/2014 >>>  Acute/ chronic sinusitis > augmentin x 20 days> f/u [facility] then return to pulmonary when [facility] eval complete> rx gabapentin 100% elimination of cough while on it, relapsed shortly p off but never back to [place] - restarted gabapenitn titrate to max of 300 mg qid or whatever dose controls cough  - changed gabapentin 300 mg twice daily 06/20/2022 >>>  - HRCT 07/02/22  1. No evidence of interstitial lung disease. 2. Moderate to large hiatal hernia. 3. Dilated main pulmonary artery, suggesting pulmonary arterial hypertension. 4. Three-vessel coronary atherosclerosis. 5. Trace pericardial effusion. 6.  Aortic Atherosclerosis (ICD10-I70.0) Rec:  Echo to eval for PH - sinus CT 07/02/22 c/w chronic sinusitis > ent eval > rx maint flonase/astelin   Continues to have pnds triggering cough which triggers gerd which then triggers more coughing   Rec  Increase gabapentin if tol to 300 qid and then taper back to lowest effective dosing

## 2022-09-22 NOTE — Assessment & Plan Note (Signed)
On background of allergic rhinitis on immunotherapy per [name] since ? 2016  - FENO during flare of cough 05/23/2022 = 40  On symb 80 2bid though hfa suboptimal - FENO off symbicort  06/20/2022  = 85 > resume symbicort 80 2bid - 06/20/2022  After extensive coaching inhaler device,  effectiveness =    75% short ti - FENO 09/22/2022   42 on symb 80 2bid   Emphasized symbiort 80 is 1sthing each am   - The proper method of use, as well as anticipated side effects, of a metered-dose inhaler were discussed and demonstrated to the patient using teach back method.   F/u in 3 m, call sooner prn          Each maintenance medication was reviewed in detail including emphasizing most importantly the difference between maintenance and prns and under what circumstances the prns are to be triggered using an action plan format where appropriate.  Total time for H and P, chart review, counseling, reviewing hfa  device(s) and generating customized AVS unique to this office visit / same day charting > 30 min for   refractory respiratory  symptoms of uncertain etiology

## 2022-09-23 ENCOUNTER — Ambulatory Visit (INDEPENDENT_AMBULATORY_CARE_PROVIDER_SITE_OTHER): Payer: Medicare HMO

## 2022-09-23 ENCOUNTER — Encounter: Payer: Self-pay | Admitting: Family Medicine

## 2022-09-23 ENCOUNTER — Ambulatory Visit (INDEPENDENT_AMBULATORY_CARE_PROVIDER_SITE_OTHER): Payer: Medicare HMO | Admitting: Family Medicine

## 2022-09-23 VITALS — BP 143/72 | HR 69 | Temp 98.0°F | Ht 69.0 in | Wt 242.4 lb

## 2022-09-23 DIAGNOSIS — S92405D Nondisplaced unspecified fracture of left great toe, subsequent encounter for fracture with routine healing: Secondary | ICD-10-CM | POA: Diagnosis not present

## 2022-09-23 DIAGNOSIS — S99922D Unspecified injury of left foot, subsequent encounter: Secondary | ICD-10-CM

## 2022-09-23 DIAGNOSIS — S92415A Nondisplaced fracture of proximal phalanx of left great toe, initial encounter for closed fracture: Secondary | ICD-10-CM | POA: Diagnosis not present

## 2022-09-23 NOTE — Progress Notes (Signed)
Subjective: CC: Toe injury PCP: Janora Norlander, DO [id number] Kristina Mclaughlin is a 72 y.o. female presenting to clinic today for:  1.  Toe injury Patient reports that she hyperflexed her left great toe a couple of weeks ago.  She was seen in office.  No x-rays obtained.  She is been utilizing oral NSAIDs, ice, elevation and really just trying to avoid things that aggravate it like using shoes with a close toe.  She unfortunately continues to have quite a bit of discomfort and is here for reevaluation today.   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, lateral back   Essential hypertension 10/29/2015   Hypertension    Insomnia     Current Outpatient Medications:    albuterol (VENTOLIN HFA) 108 (90 Base) MCG/ACT inhaler, TAKE 2 PUFFS BY MOUTH EVERY 6 HOURS AS NEEDED FOR WHEEZE OR SHORTNESS OF BREATH, Disp: 8.5 each, Rfl: 5   amLODipine (NORVASC) 5 MG tablet, TAKE 1 TABLET (5 MG TOTAL) BY MOUTH DAILY., Disp: 90 tablet, Rfl: 1   atorvastatin (LIPITOR) 40 MG tablet, TAKE 1 TABLET BY MOUTH EVERY DAY, Disp: 90 tablet, Rfl: 3   Azelastine HCl 137 MCG/SPRAY SOLN, Place into both nostrils., Disp: , Rfl:    budesonide-formoterol (SYMBICORT) 80-4.5 MCG/ACT inhaler, Take 2 puffs first thing in am and then another 2 puffs about 12 hours later., Disp: 1 each, Rfl: 12   buPROPion (WELLBUTRIN SR) 150 MG 12 hr tablet, Take 1 tablet (150 mg total) by mouth daily., Disp: 90 tablet, Rfl: 3   calcium carbonate (OSCAL) 1500 (600 Ca) MG TABS tablet, Take 600 mg of elemental calcium by mouth daily with breakfast., Disp:  , Rfl:    EPINEPHrine 0.3 mg/0.3 mL IJ SOAJ injection, Inject 0.3 mg into the muscle as needed for anaphylaxis., Disp: , Rfl:    fexofenadine (ALLEGRA) 180 MG tablet, Take 1 tablet (180 mg total) by mouth daily., Disp: 90 tablet, Rfl: 3   fluticasone (FLONASE) 50 MCG/ACT nasal spray, SPRAY 2 SPRAYS INTO EACH NOSTRIL EVERY DAY, Disp: 48 mL, Rfl: 1   gabapentin (NEURONTIN) 300 MG capsule, Take 1 capsule (300 mg total) by mouth 4 (four) times daily., Disp: 120 capsule, Rfl: 11   ketoconazole (NIZORAL) 2 % shampoo, APPLY 1 APPLICATION TOPICALLY 2 (TWO) TIMES A WEEK., Disp: 240 mL, Rfl: 0   linaCLOtide (LINZESS PO), Take by mouth., Disp: , Rfl:    Multiple Vitamin (MULTI-VITAMIN PO), Take 1 tablet by mouth daily. , Disp: , Rfl:    pantoprazole (PROTONIX) 40 MG tablet, Take 1 tablet (40 mg total) by mouth 2 (two) times daily before a meal., Disp: 180 tablet, Rfl: 3   Polyethylene Glycol 400 (BLINK TEARS OP), Place 1 drop into both eyes 2 (two) times daily as needed (dry eyes)., Disp: , Rfl:    sucralfate (CARAFATE) 1 g tablet, Take 1 tablet (1 g total) by mouth 4 (four) times daily -  before meals and at bedtime., Disp: 360 tablet, Rfl: 2   SUMAtriptan (IMITREX) 100 MG tablet, TAKE 1 TABLET AS NEED FOR MIGRAINE. MAY REPEAT IN 2 HOURS IF HEADACHE PERSISTS  OR RECURS., Disp: 8 tablet, Rfl: prn   traZODone (DESYREL) 150 MG tablet, TAKE 1 TABLET (150 MG TOTAL) BY MOUTH AT BEDTIME AS NEEDED FOR SLEEP., Disp: 90 tablet, Rfl: 3   trospium (SANCTURA) 20 MG tablet, TAKE 1 TABLET BY MOUTH TWICE A DAY, Disp: 180 tablet, Rfl: 0   valsartan-hydrochlorothiazide (DIOVAN-HCT) 320-25 MG tablet, TAKE 1 TABLET BY MOUTH EVERY DAY, Disp: 90 tablet, Rfl: 1   cyclobenzaprine (FLEXERIL) 10 MG tablet, Take 1 tablet (10 mg total) by mouth 3 (three) times daily as needed for muscle spasms. (Patient not taking: Reported on 09/23/2022), Disp: 30 tablet, Rfl: 0   diclofenac (VOLTAREN) 75 MG EC tablet, Take 1 tablet (75 mg total) by mouth  2 (two) times daily. (Patient not taking: Reported on 09/23/2022), Disp: 30 tablet, Rfl: 0 Social History   Socioeconomic History   Marital status: Married    Spouse name: Not on file   Number of children: 2   Years of education: Not on file   Highest education level: Not on file  Occupational History   Occupation: Purchasing   Tobacco Use   Smoking status: Never   Smokeless tobacco: Never  Vaping Use   Vaping Use: Never used  Substance and Sexual Activity   Alcohol use: No    Alcohol/week: 0.0 standard drinks of alcohol   Drug use: No   Sexual activity: Not on file  Other Topics Concern   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  She tries to stay active.   Social Determinants of Health   Financial Resource Strain: Low Risk  (01/21/2022)   Overall Financial Resource Strain (CARDIA)    Difficulty of Paying Living Expenses: Not hard at all  Food Insecurity: No Food Insecurity (01/21/2022)   Hunger Vital Sign    Worried About Running Out of Food in the Last Year: Never true    Ran Out of Food in the Last Year: Never true  Transportation Needs: No Transportation Needs (01/21/2022)   PRAPARE - Hydrologist (Medical): No    Lack of Transportation (Non-Medical): No  Physical Activity: Sufficiently Active (01/21/2022)   Exercise Vital Sign    Days of Exercise per Week: 3 days    Minutes of Exercise per Session: 60 min  Stress: No Stress Concern Present (01/21/2022)   [place]    Feeling of Stress : Not at all  Social Connections: [place] (01/21/2022)   Social Connection and Isolation Panel [NHANES]    Frequency of Communication with Friends and Family: More than three times a week    Frequency of Social Gatherings with Friends and Family: More than three  times a week    Attends Religious Services: More than 4 times per year    Active Member of [employer] or Organizations: Yes    Attends Archivist Meetings: More than 4 times per year    Marital Status: Married  Human resources officer Violence: Not At Risk (01/21/2022)   Humiliation, Afraid, Rape, and Kick questionnaire    Fear of Current or Ex-Partner: No    Emotionally Abused: No    Physically Abused: No    Sexually Abused: No   Family History  Problem Relation Age of Onset   Bladder Cancer Mother    Allergies Mother  Heart disease Mother    Allergies Sister    Alzheimer's disease Father    Breast cancer Neg Hx     Objective: Office vital signs reviewed. BP (!) 143/72   Pulse 69   Temp 98 F (36.7 C) (Temporal)   Ht 5' 9"$  (1.753 m)   Wt 242 lb 6 oz (109.9 kg)   BMI 35.79 kg/m   Physical Examination:  General: Awake, alert, well nourished, No acute distress MSK: Antalgic gait but ambulating independently.  Soft tissue swelling noted along the proximal left great toe.  She is exquisitely tender to palpation.  No ecchymosis.  Range of motion limited.  DG Toe Great Left  Result Date: 09/23/2022 CLINICAL DATA:  Hyperflexion injury 3 weeks ago.  Pain and swelling EXAM: LEFT GREAT TOE COMPARISON:  None Available. FINDINGS: Nondisplaced fracture involving the shaft of the great toe proximal phalanx. Subtle sclerosis along the fracture margin indicative of healing changes. No additional fractures. No dislocation. Joint spaces are preserved. Soft tissues are unremarkable. IMPRESSION: Healing nondisplaced fracture of the great toe proximal phalanx. Electronically Signed   By: Davina Poke D.O.   On: 09/23/2022 15:08    Assessment/ Plan: 72 y.o. female   Closed nondisplaced fracture of phalanx of left great toe with routine healing, unspecified phalanx, subsequent encounter - Plan: DG Toe Great Left, Ambulatory referral to Orthopedic Surgery  Personal review of x-ray  demonstrated a nondisplaced fracture of the left great toe.  I question a possible avulsion fracture of the distal phalanx but awaiting formal review by radiology.  Stat referral placed to orthopedics given ongoing pain, swelling and difficulty ambulating.  I placed her in a postop shoe in efforts to alleviate some of the pressure on that great toe.  Uncertain if orthopedics will offer additional interventions given that is 2-3 weeks out from original injury  Orders Placed This Encounter  Procedures   DG Toe Great Left    Standing Status:   Future    Number of Occurrences:   1    Standing Expiration Date:   09/24/2023    Order Specific Question:   Reason for Exam (SYMPTOM  OR DIAGNOSIS REQUIRED)    Answer:   hyperflexed 3 weeks ago. ongoing pain/ swelling    Order Specific Question:   Preferred imaging location?    Answer:   Internal   No orders of the defined types were placed in this encounter.    Janora Norlander, DO [place] [phone]

## 2022-09-26 ENCOUNTER — Other Ambulatory Visit: Payer: Self-pay | Admitting: Family Medicine

## 2022-09-26 DIAGNOSIS — M2012 Hallux valgus (acquired), left foot: Secondary | ICD-10-CM | POA: Diagnosis not present

## 2022-09-26 DIAGNOSIS — R350 Frequency of micturition: Secondary | ICD-10-CM

## 2022-09-26 DIAGNOSIS — S92415A Nondisplaced fracture of proximal phalanx of left great toe, initial encounter for closed fracture: Secondary | ICD-10-CM | POA: Diagnosis not present

## 2022-09-26 DIAGNOSIS — N3281 Overactive bladder: Secondary | ICD-10-CM

## 2022-10-01 DIAGNOSIS — R490 Dysphonia: Secondary | ICD-10-CM | POA: Diagnosis not present

## 2022-10-01 DIAGNOSIS — J3081 Allergic rhinitis due to animal (cat) (dog) hair and dander: Secondary | ICD-10-CM | POA: Diagnosis not present

## 2022-10-01 DIAGNOSIS — J320 Chronic maxillary sinusitis: Secondary | ICD-10-CM | POA: Diagnosis not present

## 2022-10-01 DIAGNOSIS — J3089 Other allergic rhinitis: Secondary | ICD-10-CM | POA: Diagnosis not present

## 2022-10-01 DIAGNOSIS — R499 Unspecified voice and resonance disorder: Secondary | ICD-10-CM | POA: Diagnosis not present

## 2022-10-01 DIAGNOSIS — K219 Gastro-esophageal reflux disease without esophagitis: Secondary | ICD-10-CM | POA: Diagnosis not present

## 2022-10-01 DIAGNOSIS — J3489 Other specified disorders of nose and nasal sinuses: Secondary | ICD-10-CM | POA: Diagnosis not present

## 2022-10-01 DIAGNOSIS — J301 Allergic rhinitis due to pollen: Secondary | ICD-10-CM | POA: Diagnosis not present

## 2022-10-02 ENCOUNTER — Telehealth: Payer: Self-pay | Admitting: Internal Medicine

## 2022-10-02 NOTE — Telephone Encounter (Signed)
Dr. Melvyn Novas recom this PT see her Dr. Because CT scan shows hernia. They want a copy of the CT scan report and Dr's notes sent over.  Dr. Is Michail Sermon  Fax # [phone]  Call Back # is [phone]

## 2022-10-03 NOTE — Telephone Encounter (Signed)
Printed off CT report and last office note. Faxed to Dr Michail Sermon. Nothing further needed

## 2022-10-16 DIAGNOSIS — J3081 Allergic rhinitis due to animal (cat) (dog) hair and dander: Secondary | ICD-10-CM | POA: Diagnosis not present

## 2022-10-16 DIAGNOSIS — J3089 Other allergic rhinitis: Secondary | ICD-10-CM | POA: Diagnosis not present

## 2022-10-16 DIAGNOSIS — J45991 Cough variant asthma: Secondary | ICD-10-CM | POA: Diagnosis not present

## 2022-10-16 DIAGNOSIS — J301 Allergic rhinitis due to pollen: Secondary | ICD-10-CM | POA: Diagnosis not present

## 2022-10-20 DIAGNOSIS — M1712 Unilateral primary osteoarthritis, left knee: Secondary | ICD-10-CM | POA: Diagnosis not present

## 2022-10-22 DIAGNOSIS — J3089 Other allergic rhinitis: Secondary | ICD-10-CM | POA: Diagnosis not present

## 2022-10-22 DIAGNOSIS — J301 Allergic rhinitis due to pollen: Secondary | ICD-10-CM | POA: Diagnosis not present

## 2022-10-22 DIAGNOSIS — J3081 Allergic rhinitis due to animal (cat) (dog) hair and dander: Secondary | ICD-10-CM | POA: Diagnosis not present

## 2022-10-23 DIAGNOSIS — K449 Diaphragmatic hernia without obstruction or gangrene: Secondary | ICD-10-CM | POA: Diagnosis not present

## 2022-11-04 ENCOUNTER — Telehealth: Payer: Self-pay | Admitting: Family Medicine

## 2022-11-04 ENCOUNTER — Other Ambulatory Visit: Payer: Self-pay | Admitting: Family Medicine

## 2022-11-04 DIAGNOSIS — M159 Polyosteoarthritis, unspecified: Secondary | ICD-10-CM

## 2022-11-04 MED ORDER — DICLOFENAC SODIUM 1 % EX GEL: 2.0000 g | Freq: Four times a day (QID) | CUTANEOUS | 99 refills | Status: AC | PRN

## 2022-11-04 NOTE — Telephone Encounter (Signed)
Pt had Diclofenac gel, which was [facility] 09/23/22 Please advise on RF, this was her last OV, next OV not sched

## 2022-11-04 NOTE — Telephone Encounter (Signed)
  Prescription Request  11/04/2022  Is this a "Controlled Substance" medicine? Not sure  Have you seen your PCP in the last 2 weeks? no  If YES, route message to pool  -  If NO, patient needs to be scheduled for appointment.  What is the name of the medication or equipment? Gel for her knee-starts with a "D"?  Have you contacted your pharmacy to request a refill? no   Which pharmacy would you like this sent to? [facility]   Patient notified that their request is being sent to the clinical staff for review and that they should receive a response within 2 business days.   Her gel is out-of-date now. Last seen 09-23-2022 w/Dr G.  Please call pt.

## 2022-11-07 DIAGNOSIS — S92415A Nondisplaced fracture of proximal phalanx of left great toe, initial encounter for closed fracture: Secondary | ICD-10-CM | POA: Diagnosis not present

## 2022-11-07 NOTE — Progress Notes (Signed)
Sent message, via epic in basket, requesting orders in epic from surgeon.  

## 2022-11-09 ENCOUNTER — Ambulatory Visit: Payer: Self-pay | Admitting: Surgery

## 2022-11-12 DIAGNOSIS — J3089 Other allergic rhinitis: Secondary | ICD-10-CM | POA: Diagnosis not present

## 2022-11-12 DIAGNOSIS — J3081 Allergic rhinitis due to animal (cat) (dog) hair and dander: Secondary | ICD-10-CM | POA: Diagnosis not present

## 2022-11-12 DIAGNOSIS — J301 Allergic rhinitis due to pollen: Secondary | ICD-10-CM | POA: Diagnosis not present

## 2022-11-12 NOTE — Progress Notes (Addendum)
COVID Vaccine Completed: Yes  Date of COVID positive in last 90 days:  No  PCP - Delynn Flavin, DO  Cardiologist - N/A Pulmonologist - Sandrea Hughs, MD  Chest x-ray - 02-14-22 Epic.  CT chest 07-02-22 Epic EKG - 11-14-22 Epic Stress Test - N/A ECHO - 08-05-22 Epic Cardiac Cath - N/A Pacemaker/ICD device last checked: Spinal Cord Stimulator: N/A  Bowel Prep - N/A  Sleep Study - N/A CPAP -   Elevated A1c 6.2 09-18-21, no diagnosis of prediabetes or diabetes.  CBG 136 at PAT Fasting Blood Sugar - Checks Blood Sugar _____ times a day  Last dose of GLP1 agonist-  N/A GLP1 instructions:  N/A   Last dose of SGLT-2 inhibitors-  N/A SGLT-2 instructions: N/A  Blood Thinner Instructions:  N/A Aspirin Instructions: Last Dose:  Activity level:  Can go up a flight of stairs and perform activities of daily living without stopping and without symptoms of chest pain or shortness of breath. 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 Patient was also instructed that they will need to review over the PAT instructions again at home before surgery.

## 2022-11-12 NOTE — Patient Instructions (Addendum)
SURGICAL WAITING ROOM VISITATION  Patients having surgery or a procedure may have no more than 2 support people in the waiting area - these visitors may rotate.    Children under the age of 35 must have an adult with them who is not the patient.  Due to an increase in RSV and influenza rates and associated hospitalizations, children ages 71 and under may not visit patients in [facility] hospitals.  If the patient needs to stay at the hospital during part of their recovery, the visitor guidelines for inpatient rooms apply. Pre-op nurse will coordinate an appropriate time for 1 support person to accompany patient in pre-op.  This support person may not rotate.    Please refer to the [facility] website for the visitor guidelines for Inpatients (after your surgery is over and you are in a regular room).       Your procedure is scheduled on: 11-18-22   Report to [facility] Main Entrance    Report to admitting at 12:15 PM   Call this number if you have problems the morning of surgery [phone]   Do not eat food :After Midnight.   After Midnight you may have the following liquids until 11:30 AM DAY OF SURGERY  Water Non-Citrus Juices (without pulp, NO RED-Apple, White grape, White cranberry) Black Coffee (NO MILK/CREAM OR CREAMERS, sugar ok)  Clear Tea (NO MILK/CREAM OR CREAMERS, sugar ok) regular and decaf                             Plain Jell-O (NO RED)                                           Fruit ices (not with fruit pulp, NO RED)                                     Popsicles (NO RED)                                                               Sports drinks like Gatorade (NO RED)                       If you have questions, please contact your surgeon's office.   FOLLOW ANY ADDITIONAL PRE OP INSTRUCTIONS YOU RECEIVED FROM YOUR SURGEON'S OFFICE!!!     Oral Hygiene is also important to reduce your risk of infection.                                    Remember  - BRUSH YOUR TEETH THE MORNING OF SURGERY WITH YOUR REGULAR TOOTHPASTE  DENTURES WILL BE REMOVED PRIOR TO SURGERY PLEASE DO NOT APPLY "Poly grip" OR ADHESIVES!!!   Do NOT smoke after Midnight   Take these medicines the morning of surgery with A SIP OF WATER:   Tylenol  Amlodipine  Atorvastatin  Bupropion  Gabapentin  Protonix  Imitrex  Trospium  Ok  to use inhalers,nasal sprays and eye drops.                              You may not have any metal on your body including hair pins, jewelry, and body piercing             Do not wear make-up, lotions, powders, perfume or deodorant  Do not wear nail polish including gel and S&S, artificial/acrylic nails, or any other type of covering on natural nails including finger and toenails. If you have artificial nails, gel coating, etc. that needs to be removed by a nail salon please have this removed prior to surgery or surgery may need to be canceled/ delayed if the surgeon/ anesthesia feels like they are unable to be safely monitored.   Do not shave  48 hours prior to surgery.     Do not bring valuables to the hospital. [facility] IS NOT   RESPONSIBLE   FOR VALUABLES.   Contacts, glasses, dentures or bridgework may not be worn into surgery.   Bring small overnight bag day of surgery.   DO NOT BRING YOUR HOME MEDICATIONS TO THE HOSPITAL. PHARMACY WILL DISPENSE MEDICATIONS LISTED ON YOUR MEDICATION LIST TO YOU DURING YOUR ADMISSION IN THE HOSPITAL!    Special Instructions: Bring a copy of your healthcare power of attorney and living will documents the day of surgery if you haven't scanned them before.              Please read over the following fact sheets you were given: IF YOU HAVE QUESTIONS ABOUT YOUR PRE-OP INSTRUCTIONS PLEASE CALL [phone] G[facility]   If you received a COVID test during your pre-op visit  it is requested that you wear a mask when out in public, stay away from anyone that may not be feeling well and notify your surgeon  if you develop symptoms. If you test positive for Covid or have been in contact with anyone that has tested positive in the last 10 days please notify you surgeon.    [facility] - Preparing for Surgery Before surgery, you can play an important role.  Because skin is not sterile, your skin needs to be as free of germs as possible.  You can reduce the number of germs on your skin by washing with CHG (chlorahexidine gluconate) soap before surgery.  CHG is an antiseptic cleaner which kills germs and bonds with the skin to continue killing germs even after washing. Please DO NOT use if you have an allergy to CHG or antibacterial soaps.  If your skin becomes reddened/irritated stop using the CHG and inform your nurse when you arrive at Short Stay. Do not shave (including legs and underarms) for at least 48 hours prior to the first CHG shower.  You may shave your face/neck.  Please follow these instructions carefully:  1.  Shower with CHG Soap the night before surgery and the  morning of surgery.  2.  If you choose to wash your hair, wash your hair first as usual with your normal  shampoo.  3.  After you shampoo, rinse your hair and body thoroughly to remove the shampoo.                             4.  Use CHG as you would any other liquid soap.  You can apply chg directly to the skin  and wash.  Gently with a scrungie or clean washcloth.  5.  Apply the CHG Soap to your body ONLY FROM THE NECK DOWN.   Do   not use on face/ open                           Wound or open sores. Avoid contact with eyes, ears mouth and   genitals (private parts).                       Wash face,  Genitals (private parts) with your normal soap.             6.  Wash thoroughly, paying special attention to the area where your    surgery  will be performed.  7.  Thoroughly rinse your body with warm water from the neck down.  8.  DO NOT shower/wash with your normal soap after using and rinsing off the CHG Soap.                9.   Pat yourself dry with a clean towel.            10.  Wear clean pajamas.            11.  Place clean sheets on your bed the night of your first shower and do not  sleep with pets. Day of Surgery : Do not apply any lotions/deodorants the morning of surgery.  Please wear clean clothes to the hospital/surgery center.  FAILURE TO FOLLOW THESE INSTRUCTIONS MAY RESULT IN THE CANCELLATION OF YOUR SURGERY  PATIENT SIGNATURE_________________________________  NURSE SIGNATURE__________________________________  ________________________________________________________________________

## 2022-11-14 ENCOUNTER — Other Ambulatory Visit: Payer: Self-pay

## 2022-11-14 ENCOUNTER — Encounter (HOSPITAL_COMMUNITY): Payer: Self-pay

## 2022-11-14 ENCOUNTER — Encounter (HOSPITAL_COMMUNITY)
Admission: RE | Admit: 2022-11-14 | Discharge: 2022-11-14 | Disposition: A | Discharge status: Home / Self Care | Payer: Medicare HMO | Source: Ambulatory Visit | Attending: Surgery | Admitting: Surgery

## 2022-11-14 VITALS — BP 144/87 | HR 77 | Temp 98.0°F | Resp 16 | Ht 69.0 in | Wt 238.8 lb

## 2022-11-14 DIAGNOSIS — K769 Liver disease, unspecified: Secondary | ICD-10-CM | POA: Diagnosis not present

## 2022-11-14 DIAGNOSIS — R7303 Prediabetes: Secondary | ICD-10-CM | POA: Diagnosis not present

## 2022-11-14 DIAGNOSIS — I10 Essential (primary) hypertension: Secondary | ICD-10-CM | POA: Insufficient documentation

## 2022-11-14 DIAGNOSIS — Z01818 Encounter for other preprocedural examination: Secondary | ICD-10-CM | POA: Insufficient documentation

## 2022-11-14 HISTORY — DX: Gastro-esophageal reflux disease without esophagitis: K21.9

## 2022-11-14 HISTORY — DX: Unspecified osteoarthritis, unspecified site: M19.90

## 2022-11-14 HISTORY — DX: Depression, unspecified: F32.A

## 2022-11-14 HISTORY — DX: Migraine, unspecified, not intractable, without status migrainosus: G43.909

## 2022-11-14 LAB — COMPREHENSIVE METABOLIC PANEL
ALT: 25 U/L (ref 0–44)
AST: 19 U/L (ref 15–41)
Albumin: 4 g/dL (ref 3.5–5.0)
Alkaline Phosphatase: 84 U/L (ref 38–126)
Anion gap: 8 (ref 5–15)
BUN: 14 mg/dL (ref 8–23)
CO2: 25 mmol/L (ref 22–32)
Calcium: 8.9 mg/dL (ref 8.9–10.3)
Chloride: 102 mmol/L (ref 98–111)
Creatinine, Ser: 0.8 mg/dL (ref 0.44–1.00)
GFR, Estimated: >60 mL/min (ref >60)
Glucose, Bld: 126 mg/dL — ABNORMAL HIGH (ref 70–99)
Potassium: 3.7 mmol/L (ref 3.5–5.1)
Sodium: 135 mmol/L (ref 135–145)
Total Bilirubin: 0.5 mg/dL (ref 0.3–1.2)
Total Protein: 6.7 g/dL (ref 6.5–8.1)

## 2022-11-14 LAB — CBC
HCT: 45.1 % (ref 36.0–46.0)
Hemoglobin: 15.3 g/dL — ABNORMAL HIGH (ref 12.0–15.0)
MCH: 29.1 pg (ref 26.0–34.0)
MCHC: 33.9 g/dL (ref 30.0–36.0)
MCV: 85.7 fL (ref 80.0–100.0)
Platelets: 209 10*3/uL (ref 150–400)
RBC: 5.26 MIL/uL — ABNORMAL HIGH (ref 3.87–5.11)
RDW: 13.6 % (ref 11.5–15.5)
WBC: 6.5 10*3/uL (ref 4.0–10.5)
nRBC: 0 % (ref 0.0–0.2)

## 2022-11-14 LAB — GLUCOSE, CAPILLARY: Glucose-Capillary: 136 mg/dL — ABNORMAL HIGH (ref 70–99)

## 2022-11-15 LAB — HEMOGLOBIN A1C
Hgb A1c MFr Bld: 6.1 % — ABNORMAL HIGH (ref 4.8–5.6)
Mean Plasma Glucose: 128.37 mg/dL

## 2022-11-18 ENCOUNTER — Ambulatory Visit (HOSPITAL_COMMUNITY): Payer: Medicare HMO | Admitting: Physician Assistant

## 2022-11-18 ENCOUNTER — Inpatient Hospital Stay (HOSPITAL_COMMUNITY)
Admission: AD | Admit: 2022-11-18 | Discharge: 2022-11-20 | DRG: 328 | Disposition: A | Discharge status: Home / Self Care | Payer: Medicare HMO | Attending: Surgery | Admitting: Surgery

## 2022-11-18 ENCOUNTER — Encounter (HOSPITAL_COMMUNITY)
Admission: AD | Disposition: A | Discharge status: Home / Self Care | Payer: Self-pay | Source: Home / Self Care | Attending: Surgery

## 2022-11-18 ENCOUNTER — Other Ambulatory Visit: Payer: Self-pay

## 2022-11-18 ENCOUNTER — Ambulatory Visit (HOSPITAL_COMMUNITY): Payer: Medicare HMO | Admitting: Anesthesiology

## 2022-11-18 ENCOUNTER — Encounter (HOSPITAL_COMMUNITY): Payer: Self-pay | Admitting: Surgery

## 2022-11-18 DIAGNOSIS — R0781 Pleurodynia: Secondary | ICD-10-CM | POA: Diagnosis not present

## 2022-11-18 DIAGNOSIS — Z79899 Other long term (current) drug therapy: Secondary | ICD-10-CM

## 2022-11-18 DIAGNOSIS — K440 Diaphragmatic hernia with obstruction, without gangrene: Principal | ICD-10-CM | POA: Diagnosis present

## 2022-11-18 DIAGNOSIS — Z8249 Family history of ischemic heart disease and other diseases of the circulatory system: Secondary | ICD-10-CM

## 2022-11-18 DIAGNOSIS — K219 Gastro-esophageal reflux disease without esophagitis: Secondary | ICD-10-CM | POA: Diagnosis present

## 2022-11-18 DIAGNOSIS — F32A Depression, unspecified: Secondary | ICD-10-CM | POA: Diagnosis present

## 2022-11-18 DIAGNOSIS — Z87891 Personal history of nicotine dependence: Secondary | ICD-10-CM

## 2022-11-18 DIAGNOSIS — Z7951 Long term (current) use of inhaled steroids: Secondary | ICD-10-CM

## 2022-11-18 DIAGNOSIS — G629 Polyneuropathy, unspecified: Secondary | ICD-10-CM | POA: Diagnosis present

## 2022-11-18 DIAGNOSIS — R1310 Dysphagia, unspecified: Secondary | ICD-10-CM | POA: Diagnosis present

## 2022-11-18 DIAGNOSIS — Z8052 Family history of malignant neoplasm of bladder: Secondary | ICD-10-CM

## 2022-11-18 DIAGNOSIS — Z882 Allergy status to sulfonamides status: Secondary | ICD-10-CM

## 2022-11-18 DIAGNOSIS — I10 Essential (primary) hypertension: Secondary | ICD-10-CM | POA: Diagnosis present

## 2022-11-18 DIAGNOSIS — Z820 Family history of epilepsy and other diseases of the nervous system: Secondary | ICD-10-CM

## 2022-11-18 DIAGNOSIS — J45909 Unspecified asthma, uncomplicated: Secondary | ICD-10-CM | POA: Diagnosis present

## 2022-11-18 DIAGNOSIS — Z888 Allergy status to other drugs, medicaments and biological substances status: Secondary | ICD-10-CM

## 2022-11-18 DIAGNOSIS — R12 Heartburn: Secondary | ICD-10-CM | POA: Diagnosis present

## 2022-11-18 DIAGNOSIS — G43909 Migraine, unspecified, not intractable, without status migrainosus: Secondary | ICD-10-CM | POA: Diagnosis present

## 2022-11-18 DIAGNOSIS — K449 Diaphragmatic hernia without obstruction or gangrene: Principal | ICD-10-CM

## 2022-11-18 DIAGNOSIS — R7303 Prediabetes: Secondary | ICD-10-CM

## 2022-11-18 DIAGNOSIS — R0902 Hypoxemia: Secondary | ICD-10-CM | POA: Diagnosis not present

## 2022-11-18 HISTORY — PX: XI ROBOTIC ASSISTED HIATAL HERNIA REPAIR: SHX6889

## 2022-11-18 SURGERY — REPAIR, HERNIA, HIATAL, ROBOT-ASSISTED
Anesthesia: General

## 2022-11-18 MED ORDER — CHLORHEXIDINE GLUCONATE 0.12 % MT SOLN
15.0000 mL | Freq: Once | OROMUCOSAL | Status: AC
  Administered 2022-11-18: 15 mL via OROMUCOSAL

## 2022-11-18 MED ORDER — ALBUTEROL SULFATE HFA 108 (90 BASE) MCG/ACT IN AERS
INHALATION_SPRAY | RESPIRATORY_TRACT | Status: DC | PRN
  Administered 2022-11-18 (×2): 4 via RESPIRATORY_TRACT

## 2022-11-18 MED ORDER — BUPIVACAINE LIPOSOME 1.3 % IJ SUSP: 20.0000 mL | Freq: Once | INTRAMUSCULAR | Status: DC

## 2022-11-18 MED ORDER — LACTATED RINGERS IV SOLN: INTRAVENOUS | Status: DC

## 2022-11-18 MED ORDER — LIDOCAINE HCL (PF) 2 % IJ SOLN
INTRAMUSCULAR | Status: AC
  Filled 2022-11-18: qty 5

## 2022-11-18 MED ORDER — BUPIVACAINE-EPINEPHRINE (PF) 0.25% -1:200000 IJ SOLN
INTRAMUSCULAR | Status: AC
  Filled 2022-11-18: qty 30

## 2022-11-18 MED ORDER — CEFAZOLIN SODIUM-DEXTROSE 2-4 GM/100ML-% IV SOLN
2.0000 g | INTRAVENOUS | Status: AC
  Administered 2022-11-18: 2 g via INTRAVENOUS
  Filled 2022-11-18: qty 100

## 2022-11-18 MED ORDER — LIDOCAINE HCL (PF) 2 % IJ SOLN
INTRAMUSCULAR | Status: AC
  Filled 2022-11-18: qty 10

## 2022-11-18 MED ORDER — TRAZODONE HCL 50 MG PO TABS
150.0000 mg | ORAL_TABLET | Freq: Every evening | ORAL | Status: DC | PRN
  Administered 2022-11-19: 150 mg via ORAL
  Filled 2022-11-18 (×2): qty 1

## 2022-11-18 MED ORDER — FLUTICASONE PROPIONATE 50 MCG/ACT NA SUSP
1.0000 | Freq: Two times a day (BID) | NASAL | Status: DC
  Administered 2022-11-18 – 2022-11-20 (×4): 1 via NASAL

## 2022-11-18 MED ORDER — CHLORHEXIDINE GLUCONATE CLOTH 2 % EX PADS: 6.0000 | MEDICATED_PAD | Freq: Once | CUTANEOUS | Status: DC

## 2022-11-18 MED ORDER — ALBUTEROL SULFATE HFA 108 (90 BASE) MCG/ACT IN AERS
INHALATION_SPRAY | RESPIRATORY_TRACT | Status: AC
  Filled 2022-11-18: qty 6.7

## 2022-11-18 MED ORDER — DEXAMETHASONE SODIUM PHOSPHATE 10 MG/ML IJ SOLN
INTRAMUSCULAR | Status: DC | PRN
  Administered 2022-11-18: 10 mg via INTRAVENOUS

## 2022-11-18 MED ORDER — PROCHLORPERAZINE EDISYLATE 10 MG/2ML IJ SOLN
10.0000 mg | INTRAMUSCULAR | Status: DC | PRN
  Administered 2022-11-19 – 2022-11-20 (×2): 10 mg via INTRAVENOUS
  Filled 2022-11-18 (×2): qty 2

## 2022-11-18 MED ORDER — ACETAMINOPHEN 500 MG PO TABS: 1000.0000 mg | ORAL_TABLET | Freq: Once | ORAL | Status: DC

## 2022-11-18 MED ORDER — KETOCONAZOLE 2 % EX SHAM: 1.0000 | MEDICATED_SHAMPOO | Freq: Every day | CUTANEOUS | Status: DC | PRN

## 2022-11-18 MED ORDER — ATORVASTATIN CALCIUM 20 MG PO TABS
40.0000 mg | ORAL_TABLET | Freq: Every day | ORAL | Status: DC
  Administered 2022-11-19 – 2022-11-20 (×2): 40 mg via ORAL
  Filled 2022-11-18 (×2): qty 2

## 2022-11-18 MED ORDER — HYDROMORPHONE HCL 1 MG/ML IJ SOLN
0.5000 mg | INTRAMUSCULAR | Status: DC | PRN
  Administered 2022-11-19 (×4): 0.5 mg via INTRAVENOUS
  Filled 2022-11-18 (×4): qty 0.5

## 2022-11-18 MED ORDER — PHENOL 1.4 % MT LIQD: 1.0000 | OROMUCOSAL | Status: DC | PRN

## 2022-11-18 MED ORDER — FLUTICASONE PROPIONATE 50 MCG/ACT NA SUSP
1.0000 | Freq: Two times a day (BID) | NASAL | Status: DC
  Filled 2022-11-18: qty 16

## 2022-11-18 MED ORDER — SUMATRIPTAN SUCCINATE 50 MG PO TABS: 100.0000 mg | ORAL_TABLET | ORAL | Status: DC | PRN

## 2022-11-18 MED ORDER — PHENYLEPHRINE HCL (PRESSORS) 10 MG/ML IV SOLN
INTRAVENOUS | Status: AC
  Filled 2022-11-18: qty 1

## 2022-11-18 MED ORDER — IRBESARTAN 150 MG PO TABS
300.0000 mg | ORAL_TABLET | Freq: Every day | ORAL | Status: DC
  Administered 2022-11-18 – 2022-11-20 (×3): 300 mg via ORAL
  Filled 2022-11-18 (×4): qty 2

## 2022-11-18 MED ORDER — BUPIVACAINE LIPOSOME 1.3 % IJ SUSP
INTRAMUSCULAR | Status: DC | PRN
  Administered 2022-11-18: 20 mL

## 2022-11-18 MED ORDER — HYDROCHLOROTHIAZIDE 25 MG PO TABS
25.0000 mg | ORAL_TABLET | Freq: Every day | ORAL | Status: DC
  Administered 2022-11-18 – 2022-11-20 (×3): 25 mg via ORAL
  Filled 2022-11-18 (×3): qty 1

## 2022-11-18 MED ORDER — FENTANYL CITRATE (PF) 250 MCG/5ML IJ SOLN
INTRAMUSCULAR | Status: AC
  Filled 2022-11-18: qty 5

## 2022-11-18 MED ORDER — ONDANSETRON HCL 4 MG/2ML IJ SOLN
INTRAMUSCULAR | Status: DC | PRN
  Administered 2022-11-18: 4 mg via INTRAVENOUS

## 2022-11-18 MED ORDER — MELATONIN 5 MG PO TABS
5.0000 mg | ORAL_TABLET | Freq: Every evening | ORAL | Status: DC | PRN
  Administered 2022-11-19: 5 mg via ORAL
  Filled 2022-11-18 (×2): qty 1

## 2022-11-18 MED ORDER — SODIUM CHLORIDE 0.9 % IR SOLN
Status: DC | PRN
  Administered 2022-11-18: 1000 mL

## 2022-11-18 MED ORDER — BUPROPION HCL ER (SR) 150 MG PO TB12
150.0000 mg | ORAL_TABLET | Freq: Every day | ORAL | Status: DC
  Administered 2022-11-19 – 2022-11-20 (×2): 150 mg via ORAL
  Filled 2022-11-18 (×2): qty 1

## 2022-11-18 MED ORDER — EPHEDRINE 5 MG/ML INJ
INTRAVENOUS | Status: AC
  Filled 2022-11-18: qty 5

## 2022-11-18 MED ORDER — VALSARTAN-HYDROCHLOROTHIAZIDE 320-25 MG PO TABS: 1.0000 | ORAL_TABLET | Freq: Every day | ORAL | Status: DC

## 2022-11-18 MED ORDER — HYDROMORPHONE HCL 1 MG/ML IJ SOLN
0.5000 mg | INTRAMUSCULAR | Status: AC | PRN
  Administered 2022-11-18 (×3): 0.5 mg via INTRAVENOUS

## 2022-11-18 MED ORDER — GABAPENTIN 600 MG PO TABS: 300.0000 mg | ORAL_TABLET | Freq: Three times a day (TID) | ORAL | Status: DC

## 2022-11-18 MED ORDER — ROCURONIUM BROMIDE 10 MG/ML (PF) SYRINGE
PREFILLED_SYRINGE | INTRAVENOUS | Status: AC
  Filled 2022-11-18: qty 10

## 2022-11-18 MED ORDER — OXYCODONE HCL 5 MG PO TABS: 5.0000 mg | ORAL_TABLET | ORAL | Status: DC | PRN

## 2022-11-18 MED ORDER — LIDOCAINE 2% (20 MG/ML) 5 ML SYRINGE
INTRAMUSCULAR | Status: DC | PRN
  Administered 2022-11-18: 1.5 mg/kg/h via INTRAVENOUS

## 2022-11-18 MED ORDER — PROPOFOL 10 MG/ML IV BOLUS
INTRAVENOUS | Status: DC | PRN
  Administered 2022-11-18: 150 mg via INTRAVENOUS

## 2022-11-18 MED ORDER — FENTANYL CITRATE PF 50 MCG/ML IJ SOSY
PREFILLED_SYRINGE | INTRAMUSCULAR | Status: AC
  Administered 2022-11-18: 50 ug via INTRAVENOUS
  Filled 2022-11-18: qty 1

## 2022-11-18 MED ORDER — HYDROMORPHONE HCL 1 MG/ML IJ SOLN
INTRAMUSCULAR | Status: AC
  Administered 2022-11-18: 0.5 mg via INTRAVENOUS
  Filled 2022-11-18: qty 2

## 2022-11-18 MED ORDER — PHENYLEPHRINE HCL (PRESSORS) 10 MG/ML IV SOLN
INTRAVENOUS | Status: DC | PRN
  Administered 2022-11-18: 80 ug via INTRAVENOUS

## 2022-11-18 MED ORDER — DOCUSATE SODIUM 100 MG PO CAPS
100.0000 mg | ORAL_CAPSULE | Freq: Two times a day (BID) | ORAL | Status: DC
  Administered 2022-11-18 – 2022-11-20 (×3): 100 mg via ORAL
  Filled 2022-11-18 (×4): qty 1

## 2022-11-18 MED ORDER — LINACLOTIDE 72 MCG PO CAPS
72.0000 ug | ORAL_CAPSULE | ORAL | Status: DC
  Administered 2022-11-19: 72 ug via ORAL
  Filled 2022-11-18: qty 1

## 2022-11-18 MED ORDER — GABAPENTIN 100 MG PO CAPS
300.0000 mg | ORAL_CAPSULE | Freq: Two times a day (BID) | ORAL | Status: DC
  Administered 2022-11-18 – 2022-11-20 (×4): 300 mg via ORAL
  Filled 2022-11-18 (×4): qty 3

## 2022-11-18 MED ORDER — PANTOPRAZOLE SODIUM 40 MG PO TBEC
40.0000 mg | DELAYED_RELEASE_TABLET | Freq: Two times a day (BID) | ORAL | Status: DC
  Administered 2022-11-19 – 2022-11-20 (×3): 40 mg via ORAL
  Filled 2022-11-18 (×3): qty 1

## 2022-11-18 MED ORDER — ALBUTEROL SULFATE (2.5 MG/3ML) 0.083% IN NEBU: 3.0000 mL | INHALATION_SOLUTION | RESPIRATORY_TRACT | Status: DC | PRN

## 2022-11-18 MED ORDER — MOMETASONE FURO-FORMOTEROL FUM 100-5 MCG/ACT IN AERO
2.0000 | INHALATION_SPRAY | Freq: Two times a day (BID) | RESPIRATORY_TRACT | Status: DC
  Filled 2022-11-18: qty 8.8

## 2022-11-18 MED ORDER — LIDOCAINE HCL (PF) 2 % IJ SOLN
INTRAMUSCULAR | Status: AC
  Filled 2022-11-18: qty 15

## 2022-11-18 MED ORDER — AZELASTINE HCL 137 MCG/SPRAY NA SOLN: 1.0000 | Freq: Every day | NASAL | Status: DC | PRN

## 2022-11-18 MED ORDER — SIMETHICONE 80 MG PO CHEW
80.0000 mg | CHEWABLE_TABLET | Freq: Four times a day (QID) | ORAL | Status: DC | PRN
  Administered 2022-11-19 – 2022-11-20 (×4): 80 mg via ORAL
  Filled 2022-11-18 (×4): qty 1

## 2022-11-18 MED ORDER — FENTANYL CITRATE PF 50 MCG/ML IJ SOSY
25.0000 ug | PREFILLED_SYRINGE | INTRAMUSCULAR | Status: DC | PRN
  Administered 2022-11-18: 50 ug via INTRAVENOUS

## 2022-11-18 MED ORDER — PROPOFOL 10 MG/ML IV BOLUS
INTRAVENOUS | Status: AC
  Filled 2022-11-18: qty 20

## 2022-11-18 MED ORDER — BUPIVACAINE-EPINEPHRINE 0.25% -1:200000 IJ SOLN
INTRAMUSCULAR | Status: DC | PRN
  Administered 2022-11-18: 30 mL

## 2022-11-18 MED ORDER — MIDAZOLAM HCL 2 MG/2ML IJ SOLN
INTRAMUSCULAR | Status: AC
  Filled 2022-11-18: qty 2

## 2022-11-18 MED ORDER — ROCURONIUM BROMIDE 10 MG/ML (PF) SYRINGE
PREFILLED_SYRINGE | INTRAVENOUS | Status: DC | PRN
  Administered 2022-11-18: 100 mg via INTRAVENOUS
  Administered 2022-11-18: 10 mg via INTRAVENOUS

## 2022-11-18 MED ORDER — MIDAZOLAM HCL 2 MG/2ML IJ SOLN
INTRAMUSCULAR | Status: DC | PRN
  Administered 2022-11-18: 2 mg via INTRAVENOUS

## 2022-11-18 MED ORDER — SUCRALFATE 1 G PO TABS
1.0000 g | ORAL_TABLET | Freq: Four times a day (QID) | ORAL | Status: DC | PRN
  Administered 2022-11-19 (×3): 1 g via ORAL
  Filled 2022-11-18 (×3): qty 1

## 2022-11-18 MED ORDER — METHOCARBAMOL 1000 MG/10ML IJ SOLN
500.0000 mg | Freq: Four times a day (QID) | INTRAVENOUS | Status: DC | PRN
  Administered 2022-11-19 (×2): 500 mg via INTRAVENOUS
  Filled 2022-11-18: qty 5
  Filled 2022-11-18 (×2): qty 500

## 2022-11-18 MED ORDER — EPINEPHRINE 0.3 MG/0.3ML IJ SOAJ: 0.3000 mg | INTRAMUSCULAR | Status: DC | PRN

## 2022-11-18 MED ORDER — AMLODIPINE BESYLATE 5 MG PO TABS
5.0000 mg | ORAL_TABLET | Freq: Every day | ORAL | Status: DC
  Administered 2022-11-19 – 2022-11-20 (×2): 5 mg via ORAL
  Filled 2022-11-18 (×2): qty 1

## 2022-11-18 MED ORDER — FENTANYL CITRATE (PF) 250 MCG/5ML IJ SOLN
INTRAMUSCULAR | Status: DC | PRN
  Administered 2022-11-18 (×2): 50 ug via INTRAVENOUS
  Administered 2022-11-18: 100 ug via INTRAVENOUS
  Administered 2022-11-18: 50 ug via INTRAVENOUS

## 2022-11-18 MED ORDER — FENTANYL CITRATE PF 50 MCG/ML IJ SOSY
PREFILLED_SYRINGE | INTRAMUSCULAR | Status: AC
  Filled 2022-11-18: qty 1

## 2022-11-18 MED ORDER — DEXAMETHASONE SODIUM PHOSPHATE 10 MG/ML IJ SOLN
INTRAMUSCULAR | Status: AC
  Filled 2022-11-18: qty 1

## 2022-11-18 MED ORDER — SUGAMMADEX SODIUM 200 MG/2ML IV SOLN
INTRAVENOUS | Status: DC | PRN
  Administered 2022-11-18: 216.6 mg via INTRAVENOUS

## 2022-11-18 MED ORDER — LIDOCAINE HCL (PF) 2 % IJ SOLN
INTRAMUSCULAR | Status: DC | PRN
  Administered 2022-11-18: 100 mg via INTRADERMAL

## 2022-11-18 MED ORDER — ONDANSETRON HCL 4 MG/2ML IJ SOLN
INTRAMUSCULAR | Status: AC
  Filled 2022-11-18: qty 2

## 2022-11-18 MED ORDER — ORAL CARE MOUTH RINSE: 15.0000 mL | Freq: Once | OROMUCOSAL | Status: AC

## 2022-11-18 MED ORDER — LORATADINE 10 MG PO TABS
10.0000 mg | ORAL_TABLET | Freq: Every day | ORAL | Status: DC
  Administered 2022-11-18 – 2022-11-19 (×2): 10 mg via ORAL
  Filled 2022-11-18 (×2): qty 1

## 2022-11-18 MED ORDER — OXYCODONE HCL 5 MG PO TABS
10.0000 mg | ORAL_TABLET | ORAL | Status: DC | PRN
  Administered 2022-11-18 – 2022-11-19 (×3): 10 mg via ORAL
  Filled 2022-11-18 (×4): qty 2

## 2022-11-18 MED ORDER — BUPIVACAINE LIPOSOME 1.3 % IJ SUSP
INTRAMUSCULAR | Status: AC
  Filled 2022-11-18: qty 20

## 2022-11-18 MED ORDER — PHENYLEPHRINE HCL-NACL 20-0.9 MG/250ML-% IV SOLN
INTRAVENOUS | Status: DC | PRN
  Administered 2022-11-18: 35 ug/min via INTRAVENOUS

## 2022-11-18 MED ORDER — BUDESONIDE-FORMOTEROL FUMARATE 80-4.5 MCG/ACT IN AERO
2.0000 | INHALATION_SPRAY | Freq: Every day | RESPIRATORY_TRACT | Status: DC
  Administered 2022-11-18 – 2022-11-19 (×2): 2 via RESPIRATORY_TRACT

## 2022-11-18 MED ORDER — ONDANSETRON HCL 4 MG/2ML IJ SOLN
4.0000 mg | Freq: Four times a day (QID) | INTRAMUSCULAR | Status: DC | PRN
  Administered 2022-11-19: 4 mg via INTRAVENOUS
  Filled 2022-11-18: qty 2

## 2022-11-18 SURGICAL SUPPLY — 66 items
ADH SKN CLS APL DERMABOND .7 (GAUZE/BANDAGES/DRESSINGS) ×1
ANTIFOG SOL W/FOAM PAD STRL (MISCELLANEOUS) ×1
APL PRP STRL LF DISP 70% ISPRP (MISCELLANEOUS) ×1
APPLIER CLIP 5 13 M/L LIGAMAX5 (MISCELLANEOUS)
APPLIER CLIP ROT 10 11.4 M/L (STAPLE)
APR CLP MED LRG 11.4X10 (STAPLE)
APR CLP MED LRG 5 ANG JAW (MISCELLANEOUS)
BAG COUNTER SPONGE SURGICOUNT (BAG) ×2 IMPLANT
BAG SPNG CNTER NS LX DISP (BAG) ×1
BLADE SURG SZ11 CARB STEEL (BLADE) ×2 IMPLANT
CHLORAPREP W/TINT 26 (MISCELLANEOUS) ×2 IMPLANT
CLIP APPLIE 5 13 M/L LIGAMAX5 (MISCELLANEOUS) IMPLANT
CLIP APPLIE ROT 10 11.4 M/L (STAPLE) IMPLANT
COVER SURGICAL LIGHT HANDLE (MISCELLANEOUS) ×2 IMPLANT
COVER TIP SHEARS 8 DVNC (MISCELLANEOUS) IMPLANT
DERMABOND ADVANCED .7 DNX12 (GAUZE/BANDAGES/DRESSINGS) ×2 IMPLANT
DRAIN PENROSE 0.5X18 (DRAIN) ×2 IMPLANT
DRAPE ARM DVNC X/XI (DISPOSABLE) ×8 IMPLANT
DRAPE COLUMN DVNC XI (DISPOSABLE) ×2 IMPLANT
DRIVER NDL LRG 8 DVNC XI (INSTRUMENTS) ×2 IMPLANT
DRIVER NDL MEGA SUTCUT DVNCXI (INSTRUMENTS) ×2 IMPLANT
DRIVER NDLE LRG 8 DVNC XI (INSTRUMENTS) ×1 IMPLANT
DRIVER NDLE MEGA SUTCUT DVNCXI (INSTRUMENTS) ×1 IMPLANT
ELECT REM PT RETURN 15FT ADLT (MISCELLANEOUS) ×2 IMPLANT
ENDOLOOP SUT PDS II  0 18 (SUTURE)
ENDOLOOP SUT PDS II 0 18 (SUTURE) IMPLANT
GAUZE 4X4 16PLY ~~LOC~~+RFID DBL (SPONGE) ×2 IMPLANT
GLOVE BIO SURGEON STRL SZ7.5 (GLOVE) ×6 IMPLANT
GLOVE INDICATOR 8.0 STRL GRN (GLOVE) ×6 IMPLANT
GOWN STRL REUS W/ TWL XL LVL3 (GOWN DISPOSABLE) IMPLANT
GOWN STRL REUS W/TWL XL LVL3 (GOWN DISPOSABLE)
GRASPER SUT TROCAR 14GX15 (MISCELLANEOUS) ×2 IMPLANT
GRASPER TIP-UP FEN DVNC XI (INSTRUMENTS) ×2 IMPLANT
IRRIG SUCT STRYKERFLOW 2 WTIP (MISCELLANEOUS)
IRRIGATION SUCT STRKRFLW 2 WTP (MISCELLANEOUS) IMPLANT
KIT BASIN OR (CUSTOM PROCEDURE TRAY) ×2 IMPLANT
KIT TURNOVER KIT A (KITS) IMPLANT
LUBRICANT JELLY K Y 4OZ (MISCELLANEOUS) IMPLANT
MARKER SKIN DUAL TIP RULER LAB (MISCELLANEOUS) ×2 IMPLANT
MESH BIO-A 7X10 SYN MAT (Mesh General) IMPLANT
NDL HYPO 22X1.5 SAFETY MO (MISCELLANEOUS) ×2 IMPLANT
NEEDLE HYPO 22X1.5 SAFETY MO (MISCELLANEOUS) ×1 IMPLANT
PACK CARDIOVASCULAR III (CUSTOM PROCEDURE TRAY) ×2 IMPLANT
PAD POSITIONING PINK XL (MISCELLANEOUS) ×2 IMPLANT
RETRACTOR GRSP SML 8 DVNC XI (INSTRUMENTS) ×2 IMPLANT
SCISSORS LAP 5X35 DISP (ENDOMECHANICALS) IMPLANT
SCISSORS MNPLR CVD DVNC XI (INSTRUMENTS) ×2 IMPLANT
SEAL UNIV 5-12 XI (MISCELLANEOUS) ×6 IMPLANT
SEALER VESSEL EXT DVNC XI (MISCELLANEOUS) ×2 IMPLANT
SET TUBE SMOKE EVAC HIGH FLOW (TUBING) ×2 IMPLANT
SOL ELECTROSURG ANTI STICK (MISCELLANEOUS) ×1
SOLUTION ANTFG W/FOAM PAD STRL (MISCELLANEOUS) ×2 IMPLANT
SOLUTION ELECTROSURG ANTI STCK (MISCELLANEOUS) ×2 IMPLANT
SPIKE FLUID TRANSFER (MISCELLANEOUS) ×2 IMPLANT
SUT ETHIBOND 0 36 GRN (SUTURE) ×4 IMPLANT
SUT MNCRL AB 4-0 PS2 18 (SUTURE) ×2 IMPLANT
SUT SILK 0 SH 30 (SUTURE) IMPLANT
SUT SILK 2 0 SH (SUTURE) IMPLANT
SYR 20ML LL LF (SYRINGE) ×2 IMPLANT
TIP INNERVISION DETACH 40FR (MISCELLANEOUS) IMPLANT
TIP INNERVISION DETACH 50FR (MISCELLANEOUS) IMPLANT
TIP INNERVISION DETACH 56FR (MISCELLANEOUS) IMPLANT
TOWEL OR 17X26 10 PK STRL BLUE (TOWEL DISPOSABLE) ×2 IMPLANT
TRAY FOLEY MTR SLVR 16FR STAT (SET/KITS/TRAYS/PACK) IMPLANT
TROCAR ADV FIXATION 5X100MM (TROCAR) IMPLANT
TROCAR BALLN 12MMX100 BLUNT (TROCAR) IMPLANT

## 2022-11-18 NOTE — Anesthesia Preprocedure Evaluation (Addendum)
Anesthesia Evaluation  Patient identified by MRN, date of birth, ID band Patient awake    Reviewed: Allergy & Precautions, NPO status , Patient's Chart, lab work & pertinent test results  Airway Mallampati: III  TM Distance: >3 FB Neck ROM: Full    Dental no notable dental hx. (+) Teeth Intact, Dental Advisory Given   Pulmonary asthma , former smoker   Pulmonary exam normal breath sounds clear to auscultation       Cardiovascular hypertension, Pt. on medications Normal cardiovascular exam Rhythm:Regular Rate:Normal  TTE 2024  1. Left ventricular ejection fraction, by estimation, is 60 to 65%. Left  ventricular ejection fraction by 3D volume is 59 %. The left ventricle has  normal function. The left ventricle has no regional wall motion  abnormalities. There is mild concentric  left ventricular hypertrophy. Left ventricular diastolic parameters are  consistent with Grade II diastolic dysfunction (pseudonormalization).  Elevated left ventricular end-diastolic pressure.   2. Right ventricular systolic function is normal. The right ventricular  size is mildly enlarged.   3. Left atrial size was mildly dilated.   4. The mitral valve is normal in structure. Trivial mitral valve  regurgitation. No evidence of mitral stenosis.   5. The aortic valve is normal in structure. Aortic valve regurgitation is  not visualized. No aortic stenosis is present.   6. The inferior vena cava is normal in size with greater than 50%  respiratory variability, suggesting right atrial pressure of 3 mmHg.     Neuro/Psych  Headaches PSYCHIATRIC DISORDERS  Depression       GI/Hepatic Neg liver ROS,GERD  Medicated and Controlled,,  Endo/Other  negative endocrine ROS    Renal/GU negative Renal ROS  negative genitourinary   Musculoskeletal  (+) Arthritis ,    Abdominal   Peds  Hematology negative hematology ROS (+)   Anesthesia Other  Findings   Reproductive/Obstetrics                             Anesthesia Physical Anesthesia Plan  ASA: 2  Anesthesia Plan: General   Post-op Pain Management: Tylenol PO (pre-op)*   Induction: Intravenous  PONV Risk Score and Plan: 3 and Dexamethasone, Ondansetron and Treatment may vary due to age or medical condition  Airway Management Planned: Oral ETT  Additional Equipment:   Intra-op Plan:   Post-operative Plan: Extubation in OR  Informed Consent: I have reviewed the patients History and Physical, chart, labs and discussed the procedure including the risks, benefits and alternatives for the proposed anesthesia with the patient or authorized representative who has indicated his/her understanding and acceptance.     Dental advisory given  Plan Discussed with: CRNA  Anesthesia Plan Comments: (2 IVs)       Anesthesia Quick Evaluation

## 2022-11-18 NOTE — Transfer of Care (Signed)
Immediate Anesthesia Transfer of Care Note  Patient: Kristina Mclaughlin  Procedure(s) Performed: ROBOTIC HIATAL HERNIA REPAIR  Patient Location: PACU  Anesthesia Type:General  Level of Consciousness: drowsy and patient cooperative  Airway & Oxygen Therapy: Patient Spontanous Breathing and Patient connected to face mask oxygen  Post-op Assessment: Report given to RN and Post -op Vital signs reviewed and stable  Post vital signs: Reviewed and stable  Last Vitals:  Vitals Value Taken Time  BP 171/74 11/18/22 1930  Temp 36.4 C 11/18/22 1927  Pulse 83 11/18/22 1932  Resp 19 11/18/22 1932  SpO2 95 % 11/18/22 1932  Vitals shown include unvalidated device data.  Last Pain:  Vitals:   11/18/22 1234  TempSrc:   PainSc: 0-No pain      Patients Stated Pain Goal: 3 (11/18/22 1234)  Complications: No notable events documented.

## 2022-11-18 NOTE — Anesthesia Procedure Notes (Signed)
Procedure Name: Intubation Date/Time: 11/18/2022 4:52 PM  Performed by: Deri Fuelling, CRNAPre-anesthesia Checklist: Patient identified, Emergency Drugs available, Suction available, Timeout performed and Patient being monitored Patient Re-evaluated:Patient Re-evaluated prior to induction Oxygen Delivery Method: Circle system utilized Preoxygenation: Pre-oxygenation with 100% oxygen Induction Type: IV induction Laryngoscope Size: Mac and 3 Grade View: Grade I Tube size: 7.0 mm Placement Confirmation: ETT inserted through vocal cords under direct vision, positive ETCO2, CO2 detector and breath sounds checked- equal and bilateral Secured at: 21 cm Tube secured with: Tape Dental Injury: Teeth and Oropharynx as per pre-operative assessment

## 2022-11-18 NOTE — H&P (Signed)
Admitting Physician: Hyman Hopes Chinonso Linker  Service: General Surgery  CC: Hiatal hernia  Subjective   HPI: Kristina Mclaughlin is an 72 y.o. female who is here for robotic hiatal hernia repair.  Notes from office visit:  Kristina Mclaughlin is a 72 y.o. female who is seen today as an office consultation for evaluation of New Consultation ( Hiatal hernia)  Ms. Burd cough for a year. She then started to look into what was causing this. She saw multiple doctors. She found the gabapentin stopped the cough, however she does not tolerate the side effects of this well.  She also has significant heartburn symptoms often waking up in the melanite throwing up acid. She also has bad dysphagia symptoms with pain with swallowing things.  Her last upper endoscopy was at [facility] gastroenterology and I do not have this paperwork, but according to their recent office note, it showed a hiatal hernia and gastritis.     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, lateral back   Depression    Essential hypertension 10/29/2015   GERD (gastroesophageal reflux disease)    Hypertension    Insomnia    Migraine headache     Past Surgical History:  Procedure Laterality Date   ABDOMINAL HYSTERECTOMY     BACK SURGERY     BREAST SURGERY     biopsy x2; reduction   EXAMINATION UNDER ANESTHESIA  08/27/2012   LIPOMA EXCISION Right 07/03/2021   Procedure: EXCISION LIPOMA; ANKLE;  Surgeon: Lucretia Roers, MD;  Location: [facility];  Service: General;  Laterality: Right;   LIPOMA EXCISION N/A 07/03/2021   Procedure: EXCISION LIPOMA; SUPRAPUBIC;  Surgeon: Lucretia Roers, MD;  Location: [facility];  Service: General;  Laterality: N/A;   MASS EXCISION Left 07/03/2021   Procedure: EXCISION LIPOMA; ANKLE;  Surgeon: Lucretia Roers, MD;  Location: [facility];  Service: General;  Laterality: Left;   MASS EXCISION Left 07/03/2021    Procedure: EXCISION OF LIPOMA; ARM;  Surgeon: Lucretia Roers, MD;  Location: [facility];  Service: General;  Laterality: Left;   NECK SURGERY     Mass removed   REDUCTION MAMMAPLASTY Bilateral 2002    Family History  Problem Relation Age of Onset   Bladder Cancer Mother    Allergies Mother    Heart disease Mother    Allergies Sister    Alzheimer's disease Father    Breast cancer Neg Hx     Social:  reports that she has quit smoking. Her smoking use included cigarettes. She has a 0.50 pack-year smoking history. She has never used smokeless tobacco. She reports that she does not drink alcohol and does not use drugs.  Allergies:  Allergies  Allergen Reactions   Lovenox [Enoxaparin Sodium] Anaphylaxis   Moxifloxacin Anaphylaxis and Other (See Comments)   Quinolones Hives and Shortness Of Breath   Mucinex Sinus-Max [Phenylephrine-Apap-Guaifenesin] Swelling    "Red mucinex tablet"    Sulfamethoxazole-Trimethoprim Other (See Comments)    Unknown reaction    Medications: Current Outpatient Medications  Medication Instructions   acetaminophen (TYLENOL) 1,000 mg, Oral, Every 6 hours PRN   albuterol (VENTOLIN HFA) 108 (90 Base) MCG/ACT inhaler TAKE 2 PUFFS BY MOUTH EVERY 6 HOURS AS NEEDED FOR WHEEZE OR SHORTNESS OF BREATH   amLODipine (NORVASC) 5 mg, Oral, Daily   atorvastatin (LIPITOR) 40 mg, Oral, Daily   Azelastine HCl 137  MCG/SPRAY SOLN 1 spray, Each Nare, Daily PRN   Biotin 10 mg, Oral, Daily   budesonide-formoterol (SYMBICORT) 80-4.5 MCG/ACT inhaler Take 2 puffs first thing in am and then another 2 puffs about 12 hours later.   buPROPion (WELLBUTRIN SR) 150 mg, Oral, Daily   Calcium Carbonate (CALCIUM 500 PO) 500 mg, Oral, Daily   COLLAGEN PO 1 tablet, Oral, Daily   diclofenac Sodium (VOLTAREN) 2 g, Topical, 4 times daily PRN   EPINEPHrine (EPI-PEN) 0.3 mg, Intramuscular, As needed   fexofenadine (ALLEGRA) 180 mg, Oral, Daily   fluticasone (FLONASE) 50 MCG/ACT nasal spray  SPRAY 2 SPRAYS INTO EACH NOSTRIL EVERY DAY   gabapentin (NEURONTIN) 300 mg, Oral, 4 times daily   ketoconazole (NIZORAL) 2 % shampoo 1 Application, Topical, 2 times weekly   linaclotide (LINZESS) 72 mcg, Oral, Every other day   Melatonin 5 mg, Oral, At bedtime PRN   meloxicam (MOBIC) 7.5 mg, Oral, Daily PRN   Multiple Vitamin (MULTI-VITAMIN PO) 1 tablet, Oral, Daily   pantoprazole (PROTONIX) 40 mg, Oral, 2 times daily before meals   Polyethylene Glycol 400 (BLINK TEARS OP) 1 drop, Both Eyes, Daily   Probiotic Product (PROBIOTIC PO) 1 capsule, Oral, Daily   sucralfate (CARAFATE) 1 g, Oral, 3 times daily before meals & bedtime   SUMAtriptan (IMITREX) 100 MG tablet TAKE 1 TABLET AS NEED FOR MIGRAINE. MAY REPEAT IN 2 HOURS IF HEADACHE PERSISTS OR RECURS.   traZODone (DESYREL) 150 mg, Oral, At bedtime PRN   trospium (SANCTURA) 20 MG tablet TAKE 1 TABLET BY MOUTH TWICE A DAY   Turmeric Curcumin 500 mg, Oral, Daily   valsartan-hydrochlorothiazide (DIOVAN-HCT) 320-25 MG tablet 1 tablet, Oral, Daily   Vitamin D3 5,000 Units, Oral, Daily    ROS - all of the below systems have been reviewed with the patient and positives are indicated with bold text General: chills, fever or night sweats Eyes: blurry vision or double vision ENT: epistaxis or sore throat Allergy/Immunology: itchy/watery eyes or nasal congestion Hematologic/Lymphatic: bleeding problems, blood clots or swollen lymph nodes Endocrine: temperature intolerance or unexpected weight changes Breast: new or changing breast lumps or nipple discharge Resp: cough, shortness of breath, or wheezing CV: chest pain or dyspnea on exertion GI: as per HPI GU: dysuria, trouble voiding, or hematuria MSK: joint pain or joint stiffness Neuro: TIA or stroke symptoms Derm: pruritus and skin lesion changes Psych: anxiety and depression  Objective   PE Blood pressure (!) 150/79, pulse (!) 55, temperature 98.5 F (36.9 C), temperature source Oral,  resp. rate 16, height 5\' 9"  (1.753 m), weight 108.3 kg, SpO2 95 %. Constitutional: NAD; conversant; no deformities Eyes: Moist conjunctiva; no lid lag; anicteric; PERRL Neck: Trachea midline; no thyromegaly Lungs: Normal respiratory effort; no tactile fremitus CV: RRR; no palpable thrills; no pitting edema GI: Abd Soft, nontender; no palpable hepatosplenomegaly MSK: Normal range of motion of extremities; no clubbing/cyanosis Psychiatric: Appropriate affect; alert and oriented x3 Lymphatic: No palpable cervical or axillary lymphadenopathy  No results found for this or any previous visit (from the past 24 hour(s)).  Imaging Orders  No imaging studies ordered today   CT Chest 2022-07-03  1. No evidence of interstitial lung disease. 2. Moderate to large hiatal hernia. 3. Dilated main pulmonary artery, suggesting pulmonary arterial hypertension. 4. Three-vessel coronary atherosclerosis. 5. Trace pericardial effusion. 6. Aortic Atherosclerosis (ICD10-I70.0).   Assessment and Plan   Kristina Mclaughlin is an 72 y.o. female with a hiatal hernia.   Ms. Broxterman has  a hiatal hernia and significant symptoms of dysphagia. She also complains of heartburn symptoms. She also has cough as a primary complaint. I explained there are typical and atypical symptoms of hiatal hernias. I explained the typical symptoms of heartburn from acid reflux and dysphagia should improve with hiatal hernia repair, however atypical symptoms may or may not respond well to hiatal hernia repair. It seems her dysphagia is significant and enough that she would want to surgery to fix this. I am hopeful that her heartburn and cough will also improve, however I explained these symptoms may continue postoperatively. I do not feel additional testing is necessary as the patient has significant dysphagia symptoms warranting a hiatal hernia repair. I do not feel manometry would provide accurate motility information, and I plan not  to be aggressive with the fundoplication at the time of surgery due to her dysphagia symptoms so I feel manometry is not needed in this case.  I recommended robotic hiatal hernia repair. We discussed the procedure itself as well as its risk, benefits, and alternatives. We discussed the high recurrence rate related to hiatal hernia repair and the recommendation only to have a hiatal hernia repair if she has significant symptoms. After full discussion all questions answered the patient granted consent to proceed. Our surgery scheduler will reach out to the patient to schedule surgery.   Quentin Ore, MD  [facility] Use [email or web address] to contact on call provider

## 2022-11-18 NOTE — Op Note (Signed)
Patient: Kristina Mclaughlin (Jun 16, 1951, [id number])  Date of Surgery: 11/18/2022   Preoperative Diagnosis: HIATAL HERNIA WITH DYSPHAGIA  Postoperative Diagnosis: HIATAL HERNIA WITH DYSPHAGIA  Surgical Procedure: ROBOTIC HIATAL HERNIA REPAIR WITH PLACEMENT OF BIO-A MESH AND TOUPET FUNDOPLICATION  Operative Team Members:  Surgeon(s) and Role:    * Kenlei Safi, Hyman Hopes, MD - Primary   Anesthesiologist: Elmer Picker, MD CRNA: Epimenio Sarin, CRNA; Deri Fuelling, CRNA   Anesthesia: General   Fluids:  Total I/O In: 1000 [I.V.:1000] Out: 20 [Blood:20]  Complications: None  Drains:  none   Specimen: None  Disposition:  PACU - hemodynamically stable.  Plan of Care: Admit for overnight observation  Indications for Procedure: Kristina Mclaughlin is a 72 y.o. female who presented with a symptomatic hiatal hernia.  I recommended robotic repair.  We discussed the procedure, its risks, benefits and alternatives.  After a full discussion and all questions answered the patient granted consent to proceed.  Findings: Large hiatal hernia containing approximately 1/2 of the stomach.  Description of Procedure:   The patient was positioned supine, padded and secured to the bed.  The abdomen was widely prepped and draped.  A time out procedure was performed.   The abdomen was entered in the right hypochondrium, utilizing a 5 mm optical-viewing trocar.  Upon safe entry into the abdomen, it was insufflated completely.  Under direct visualization, a four 8mm trocars were placed across the mid abdomen upgrading the initial trocar.  A Nathanson liver retractor was placed at the epigastrium and utilized to retract the left lobe of the liver anteriorly.  It was held in position with a laparoscopic holding device clamped to the side of the bed.  The robot was docked in the standard fashion and the operation begun from the robotic console.    Beginning at the twelve o'clock  position on the crus, the hernia sac was grasped and reduced form the mediastinum.  The hernia sac was divided to enter the plane between the sac and the mediastinum.  The hernia sac was divided towards the left and right with continued traction on the hernia sac to reduce it.  A mediastinal dissection was performed to further reduce the hernia sac which facilitated reduction of the stomach as well.  The hernia sac was disconnected from the right and left crura and subsequently, the hernia sac was dissected from the stomach and esophagus, and excised.  The short gastric vessels were divided from the inferior pole of the spleen, superiorly, to completely disconnect the stomach from the spleen and the left hemidiaphragm.  All posterior gastric attachments to the lesser sac and retroperitoneum were divided.  The left crus was further delineated.  A retroesophageal window was created and the tip-up grasper was passed behind the esophagus for retraction.    A high, circumferential mediastinal dissection was performed in an effort to mobilize the esophagus and provide for adequate intraabdominal esophageal length.  The mediastinal dissection was performed bluntly, with little to no thermal energy.  The anterior and posterior vagus nerves were both identified and preserved.  The crural defect was reapproximated with four, interrupted, 0 Ethibond sutures.  The crural pillars came together well without tearing of the adjacent diaphragmatic tissue.    Due to the size of the hiatal hernia, I decided to place a absorbable Bio-A mesh to prevent early hernia recurrence.  The mesh was inserted in the abdomen, positioned to cover the crural closure, and fixed to the crura using 4-0  silk sutures.  The mesh was tucked behind the caudate lobe of the liver and the fatty tissue in the left upper quadrant laid flat against the diaphragm.  A loose toupet fundoplication was performed.  The fundus the stomach was passed by the  esophagus performing a shoeshine maneuver to ensure no twisting of the fundus and appropriate positioning around the esophagus.  Three 0 silk sutures were placed to fix the wrap to the right anterior portion of the esophagus, then three additional 0 silk sutures were placed to fix the wrap to the left anterior portion of the esophagus.  This created a toupee fundoplication wrapping approximately 270 degrees of the esophagus.  The operative field was inspected for hemostasis.   All sutures were removed.  The robot was undocked and the laparoscope was inserted into the peritoneal cavity to visualize the removal of the liver retractor.    The peritoneal cavity was desufflated and the trocars removed.  The incisions were closed with 4-0 Monocryl subcuticular sutures and skin glue.   Ivar Drape, MD General, Bariatric, & Minimally Invasive Surgery [facility], [place]

## 2022-11-19 ENCOUNTER — Observation Stay (HOSPITAL_COMMUNITY): Payer: Medicare HMO

## 2022-11-19 ENCOUNTER — Encounter (HOSPITAL_COMMUNITY): Payer: Self-pay | Admitting: Surgery

## 2022-11-19 DIAGNOSIS — R0781 Pleurodynia: Secondary | ICD-10-CM | POA: Diagnosis not present

## 2022-11-19 DIAGNOSIS — Z820 Family history of epilepsy and other diseases of the nervous system: Secondary | ICD-10-CM | POA: Diagnosis not present

## 2022-11-19 DIAGNOSIS — J9811 Atelectasis: Secondary | ICD-10-CM | POA: Diagnosis not present

## 2022-11-19 DIAGNOSIS — R1310 Dysphagia, unspecified: Secondary | ICD-10-CM | POA: Diagnosis not present

## 2022-11-19 DIAGNOSIS — Z882 Allergy status to sulfonamides status: Secondary | ICD-10-CM | POA: Diagnosis not present

## 2022-11-19 DIAGNOSIS — Z79899 Other long term (current) drug therapy: Secondary | ICD-10-CM | POA: Diagnosis not present

## 2022-11-19 DIAGNOSIS — Z87891 Personal history of nicotine dependence: Secondary | ICD-10-CM | POA: Diagnosis not present

## 2022-11-19 DIAGNOSIS — G43909 Migraine, unspecified, not intractable, without status migrainosus: Secondary | ICD-10-CM | POA: Diagnosis not present

## 2022-11-19 DIAGNOSIS — I10 Essential (primary) hypertension: Secondary | ICD-10-CM | POA: Diagnosis not present

## 2022-11-19 DIAGNOSIS — Z8249 Family history of ischemic heart disease and other diseases of the circulatory system: Secondary | ICD-10-CM | POA: Diagnosis not present

## 2022-11-19 DIAGNOSIS — F32A Depression, unspecified: Secondary | ICD-10-CM | POA: Diagnosis not present

## 2022-11-19 DIAGNOSIS — J939 Pneumothorax, unspecified: Secondary | ICD-10-CM | POA: Diagnosis not present

## 2022-11-19 DIAGNOSIS — R0902 Hypoxemia: Secondary | ICD-10-CM | POA: Diagnosis not present

## 2022-11-19 DIAGNOSIS — K449 Diaphragmatic hernia without obstruction or gangrene: Secondary | ICD-10-CM | POA: Diagnosis not present

## 2022-11-19 DIAGNOSIS — Z7951 Long term (current) use of inhaled steroids: Secondary | ICD-10-CM | POA: Diagnosis not present

## 2022-11-19 DIAGNOSIS — K440 Diaphragmatic hernia with obstruction, without gangrene: Secondary | ICD-10-CM | POA: Diagnosis not present

## 2022-11-19 DIAGNOSIS — R12 Heartburn: Secondary | ICD-10-CM | POA: Diagnosis not present

## 2022-11-19 DIAGNOSIS — J45909 Unspecified asthma, uncomplicated: Secondary | ICD-10-CM | POA: Diagnosis not present

## 2022-11-19 DIAGNOSIS — Z888 Allergy status to other drugs, medicaments and biological substances status: Secondary | ICD-10-CM | POA: Diagnosis not present

## 2022-11-19 DIAGNOSIS — Z8052 Family history of malignant neoplasm of bladder: Secondary | ICD-10-CM | POA: Diagnosis not present

## 2022-11-19 DIAGNOSIS — K219 Gastro-esophageal reflux disease without esophagitis: Secondary | ICD-10-CM | POA: Diagnosis not present

## 2022-11-19 DIAGNOSIS — G629 Polyneuropathy, unspecified: Secondary | ICD-10-CM | POA: Diagnosis not present

## 2022-11-19 LAB — BASIC METABOLIC PANEL
Anion gap: 11 (ref 5–15)
BUN: 9 mg/dL (ref 8–23)
CO2: 23 mmol/L (ref 22–32)
Calcium: 8.5 mg/dL — ABNORMAL LOW (ref 8.9–10.3)
Chloride: 101 mmol/L (ref 98–111)
Creatinine, Ser: 0.71 mg/dL (ref 0.44–1.00)
GFR, Estimated: >60 mL/min (ref >60)
Glucose, Bld: 210 mg/dL — ABNORMAL HIGH (ref 70–99)
Potassium: 3.8 mmol/L (ref 3.5–5.1)
Sodium: 135 mmol/L (ref 135–145)

## 2022-11-19 LAB — CBC
HCT: 41.8 % (ref 36.0–46.0)
Hemoglobin: 13.7 g/dL (ref 12.0–15.0)
MCH: 29.1 pg (ref 26.0–34.0)
MCHC: 32.8 g/dL (ref 30.0–36.0)
MCV: 88.7 fL (ref 80.0–100.0)
Platelets: 173 10*3/uL (ref 150–400)
RBC: 4.71 MIL/uL (ref 3.87–5.11)
RDW: 13.3 % (ref 11.5–15.5)
WBC: 12.6 10*3/uL — ABNORMAL HIGH (ref 4.0–10.5)
nRBC: 0 % (ref 0.0–0.2)

## 2022-11-19 LAB — TROPONIN I (HIGH SENSITIVITY)
Troponin I (High Sensitivity): 9 ng/L (ref <18)
Troponin I (High Sensitivity): 12 ng/L (ref <18)

## 2022-11-19 MED ORDER — OXYCODONE-ACETAMINOPHEN 5-325 MG PO TABS: 1.0000 | ORAL_TABLET | ORAL | 0 refills | Status: DC | PRN

## 2022-11-19 MED ORDER — NITROGLYCERIN 0.4 MG SL SUBL
0.4000 mg | SUBLINGUAL_TABLET | SUBLINGUAL | Status: DC | PRN
  Filled 2022-11-19: qty 1

## 2022-11-19 MED ORDER — HYDRALAZINE HCL 20 MG/ML IJ SOLN
5.0000 mg | INTRAMUSCULAR | Status: DC | PRN
  Administered 2022-11-19 – 2022-11-20 (×4): 5 mg via INTRAVENOUS
  Filled 2022-11-19 (×4): qty 1

## 2022-11-19 NOTE — Progress Notes (Signed)
Patient was experiencing right side chest pain that radiated to her back. Rapid response and Dr. Dossie Der were called. RR came up to the room and an EKG was done as well as ordered nitro which was not used. Patient's BP was elevated. She was given pain medication, robaxin, and her regular medications to include BP medications.  Patient felt better within 15-20 minutes. Dr. Dossie Der called and gave new orders.

## 2022-11-19 NOTE — Progress Notes (Addendum)
72 year old female patient with a chief complaint of right sided chest pain., with no abdominal pain. Patients pain level was a 10 out of 10 this morning. Patients blood pressure was extremely high when pain was a 10. Rapid response team was called to perform a evaluation. EKG clear of any sinus infarction. Patient given pain medication to subside the pain. Patient heart is clear from any murmurs. Lungs sounds clear bilaterally. Patient states they have gas and have not had a bowel since yesterday morning. Bowel sounds hyperactive in all four quadrants. Skin is intact and clear of any lesions or bruises. Patients IV infiltrated on her left hand. IV was discontinued and cold compress was given.    Waldo Laine Runner, broadcasting/film/video

## 2022-11-19 NOTE — Progress Notes (Addendum)
Progress Note: General Surgery Service   Chief Complaint/Subjective: Right sided pleuritic chest pain.  No abdominal pain.  Swallowed pills including gabapentin without issue  Objective: Vital signs in last 24 hours: Temp:  [97.3 F (36.3 C)-98.5 F (36.9 C)] 97.8 F (36.6 C) (04/17 0524) Pulse Rate:  [55-85] 78 (04/17 0524) Resp:  [14-21] 16 (04/17 0524) BP: (150-176)/(61-79) 157/71 (04/17 0524) SpO2:  [89 %-96 %] 89 % (04/17 0524) Weight:  [108.3 kg] 108.3 kg (04/16 1234)    Intake/Output from previous day: 04/16 0701 - 04/17 0700 In: 2678.6 [P.O.:240; I.V.:2338.6; IV Piggyback:100] Out: 1520 [Urine:1500; Blood:20] Intake/Output this shift: No intake/output data recorded.  Constitutional: NAD; conversant; no deformities Eyes: Moist conjunctiva; no lid lag; anicteric; PERRL Neck: Trachea midline; no thyromegaly Lungs: Normal respiratory effort; no tactile fremitus CV: RRR; no palpable thrills; no pitting edema GI: Abd Incisions c/d/I w/ glue; no palpable hepatosplenomegaly MSK: Normal range of motion of extremities; no clubbing/cyanosis Psychiatric: Appropriate affect; alert and oriented x3 Lymphatic: No palpable cervical or axillary lymphadenopathy  Lab Results: CBC  Recent Labs    11/19/22 0441  WBC 12.6*  HGB 13.7  HCT 41.8  PLT 173   BMET Recent Labs    11/19/22 0441  NA 135  K 3.8  CL 101  CO2 23  GLUCOSE 210*  BUN 9  CREATININE 0.71  CALCIUM 8.5*   PT/INR No results for input(s): "LABPROT", "INR" in the last 72 hours. ABG No results for input(s): "PHART", "HCO3" in the last 72 hours.  Invalid input(s): "PCO2", "PO2"  Anti-infectives: Anti-infectives (From admission, onward)    Start     Dose/Rate Route Frequency Ordered Stop   11/19/22 0600  ceFAZolin (ANCEF) IVPB 2g/100 mL premix        2 g 200 mL/hr over 30 Minutes Intravenous On call to O.R. 11/18/22 1220 11/18/22 1653       Medications: Scheduled Meds:  amLODipine  5 mg Oral  Daily   atorvastatin  40 mg Oral Daily   budesonide-formoterol  2 puff Inhalation QHS   buPROPion  150 mg Oral Daily   docusate sodium  100 mg Oral BID   fluticasone  1 spray Each Nare BID   gabapentin  300 mg Oral BID   irbesartan  300 mg Oral Daily   And   hydrochlorothiazide  25 mg Oral Daily   linaclotide  72 mcg Oral QODAY   loratadine  10 mg Oral QHS   pantoprazole  40 mg Oral BID AC   Continuous Infusions:  lactated ringers 50 mL/hr at 11/18/22 2130   methocarbamol (ROBAXIN) IV     PRN Meds:.albuterol, Azelastine HCl, EPINEPHrine, HYDROmorphone (DILAUDID) injection, ketoconazole, melatonin, methocarbamol (ROBAXIN) IV, ondansetron (ZOFRAN) IV, oxyCODONE, oxyCODONE, phenol, prochlorperazine, simethicone, sucralfate, SUMAtriptan, traZODone  Assessment/Plan: ROBOTIC HIATAL HERNIA REPAIR 11/18/2022  - Doing okay POD1 - Right sided pleuritic chest pain, some hypoxia and ventilator changes consistent with a capnothorax on the right.  Should resolve with observation and pulmonary toilet.  Will watch symptoms for now and check CXR to confirm. - Stop IVF - Increase activity - Full liquid diet    LOS: 0 days    Quentin Ore, MD  [facility] Use [email or web address] to contact on call provider  Daily Billing: [id number] - post op

## 2022-11-19 NOTE — TOC CM/SW Note (Signed)
Transition of Care [facility]) Screening Note  Patient Details  Name: Kristina Mclaughlin Date of Birth: [birth year]  Transition of Care [facility]) CM/SW Contact:    Ewing Schlein, LCSW Phone Number: 11/19/2022, 11:54 AM  Transition of Care Department [facility]) has reviewed patient and no TOC needs have been identified at this time. We will continue to monitor patient advancement through interdisciplinary progression rounds. If new patient transition needs arise, please place a TOC consult.

## 2022-11-19 NOTE — Progress Notes (Signed)
Mobility Specialist - Progress Note   11/19/22 1400  Oxygen Therapy  SpO2 (!) 89 %  O2 Device Nasal Cannula  Patient Activity (if Appropriate) Ambulating  Mobility  Activity Ambulated independently in hallway  Level of Assistance Independent  Assistive Device None  Distance Ambulated (ft) 480 ft  Activity Response Tolerated well  Mobility Referral Yes  $Mobility charge 1 Mobility   Pt received in bed and agreeable to mobility. Prior to ambulating pt requested to use the bathroom. Nursing student recorded volume. Pt had several burps throughout session. Pt took 1x standing rest break due to O2 dropping to 89%. Pt to recliner after session with all needs met & nursing students in room.   Pre-mobility: 91 HR, 92% SpO2 (2L [place]) During mobility: 92 HR, 89% SpO2 (2L [place]) Post-mobility: 91 HR, 91% SPO2 (2L [place])  Chief Technology Officer

## 2022-11-19 NOTE — Significant Event (Signed)
Rapid Response Event Note   Reason for Call :  Sudden right sided Chest pain radiating to back  Initial Focused Assessment:  Patient in bed experiencing intermittent right sided chest pain radiating to patient right scapula. Patients vital signs BP 190/82,  pulse 88, Spo2 96% on 2L [place]. Heart sounds regular S1 & S2, pulses +2 radial regular. Patient A/O X4. Lung sounds clear, rate 24 breaths/ min. Bowel sounds very Hyperactive   Interventions:  Nitroglycerin ordered but not administered  EKG obtained which was negative for STEMI/ NSTEMI Troponin ordered  Plan of Care:  Trend Troponin's  Admin Mylicon for gas  Robaxin for muscle spasm  Dilaudid 0.5mg  for breakthrough pain  Event Summary:  If chest pain continues to worsen or change reach back out to rapid response/ MD. Encourage patient to pulmonary toilet, ambulate as tolerated, and move their bowels per MD.  MD Notified: Stechschulte MD Call Time: 1015 Arrival Time: 1020 End Time: 1050  Teresita Madura, RN

## 2022-11-20 LAB — CBC
HCT: 41.5 % (ref 36.0–46.0)
Hemoglobin: 13.5 g/dL (ref 12.0–15.0)
MCH: 28.4 pg (ref 26.0–34.0)
MCHC: 32.5 g/dL (ref 30.0–36.0)
MCV: 87.4 fL (ref 80.0–100.0)
Platelets: 168 10*3/uL (ref 150–400)
RBC: 4.75 MIL/uL (ref 3.87–5.11)
RDW: 13.5 % (ref 11.5–15.5)
WBC: 9.7 10*3/uL (ref 4.0–10.5)
nRBC: 0 % (ref 0.0–0.2)

## 2022-11-20 LAB — BASIC METABOLIC PANEL
Anion gap: 10 (ref 5–15)
BUN: 6 mg/dL — ABNORMAL LOW (ref 8–23)
CO2: 27 mmol/L (ref 22–32)
Calcium: 8.5 mg/dL — ABNORMAL LOW (ref 8.9–10.3)
Chloride: 98 mmol/L (ref 98–111)
Creatinine, Ser: 0.68 mg/dL (ref 0.44–1.00)
GFR, Estimated: >60 mL/min (ref >60)
Glucose, Bld: 142 mg/dL — ABNORMAL HIGH (ref 70–99)
Potassium: 3.1 mmol/L — ABNORMAL LOW (ref 3.5–5.1)
Sodium: 135 mmol/L (ref 135–145)

## 2022-11-20 MED ORDER — ONDANSETRON HCL 4 MG PO TABS: 4.0000 mg | ORAL_TABLET | Freq: Three times a day (TID) | ORAL | 0 refills | Status: DC | PRN

## 2022-11-20 NOTE — Discharge Instructions (Signed)
Diet - Stay hydrated! Drink at least 64 ounces of fluid per day - Be sure to take 90 grams of protein per day - Stick to a liquid diet for two weeks after discharge from the hospital.  Eat soft foods the next two weeks.  After these four weeks ease back into your normal diet.  Aovid foods that were difficult to swallow before surgery. - Avoid foods with high amounts of sugar and starch

## 2022-11-20 NOTE — Discharge Summary (Signed)
Patient ID: Kristina Mclaughlin [id number] 71 y.o. 05-27-1951  11/18/2022  Discharge date and time: 11/20/2022  Admitting Physician: Kristina Mclaughlin Kristina Mclaughlin  Discharge Physician: Kristina Mclaughlin Kristina Mclaughlin  Admission Diagnoses: Hiatal hernia [K44.9] Patient Active Problem List   Diagnosis Date Noted   Hiatal hernia 11/18/2022   Acid reflux 02/10/2022   Chronic GERD 02/10/2022   Gastroesophageal reflux disease 02/10/2022   Chronic superficial gastritis without bleeding 02/10/2022   Fatty liver 02/10/2022   Rectal pain 02/10/2022   Rectal bleeding 02/10/2022   Allergic rhinitis due to animal (cat) (dog) hair and dander 01/20/2022   Cough variant asthma 01/20/2022   Chronic constipation 09/18/2021   Neuropathic pain 08/22/2021   Lipoma    Lipoma of foot 06/11/2021   Lipoma of upper arm 06/11/2021   Acute pain of left knee 05/27/2021   Other chest pain 05/27/2021   Sensorineural hearing loss (SNHL) of left ear with unrestricted hearing of right ear 12/11/2020   Spitting suture 04/03/2020   Lipoma of abdominal wall 12/15/2019   Breast lipoma 12/15/2019   Lipoma of both lower extremities 12/15/2019   Tear of medial meniscus of knee 09/06/2018   Pain in right knee 05/27/2018   Primary insomnia 05/11/2018   Unilateral primary osteoarthritis, left knee 05/11/2018   Hemorrhoids 01/22/2018   Allergic rhinitis 10/29/2015   Essential hypertension 10/29/2015   Upper airway cough syndrome 10/20/2014     Discharge Diagnoses:  Patient Active Problem List   Diagnosis Date Noted   Hiatal hernia 11/18/2022   Acid reflux 02/10/2022   Chronic GERD 02/10/2022   Gastroesophageal reflux disease 02/10/2022   Chronic superficial gastritis without bleeding 02/10/2022   Fatty liver 02/10/2022   Rectal pain 02/10/2022   Rectal bleeding 02/10/2022   Allergic rhinitis due to animal (cat) (dog) hair and dander 01/20/2022   Cough variant asthma 01/20/2022   Chronic constipation 09/18/2021    Neuropathic pain 08/22/2021   Lipoma    Lipoma of foot 06/11/2021   Lipoma of upper arm 06/11/2021   Acute pain of left knee 05/27/2021   Other chest pain 05/27/2021   Sensorineural hearing loss (SNHL) of left ear with unrestricted hearing of right ear 12/11/2020   Spitting suture 04/03/2020   Lipoma of abdominal wall 12/15/2019   Breast lipoma 12/15/2019   Lipoma of both lower extremities 12/15/2019   Tear of medial meniscus of knee 09/06/2018   Pain in right knee 05/27/2018   Primary insomnia 05/11/2018   Unilateral primary osteoarthritis, left knee 05/11/2018   Hemorrhoids 01/22/2018   Allergic rhinitis 10/29/2015   Essential hypertension 10/29/2015   Upper airway cough syndrome 10/20/2014    Operations: Procedure(s): ROBOTIC HIATAL HERNIA REPAIR  Admission Condition: good  Discharged Condition: good  Indication for Admission: Hiatal hernia  Hospital Course: Kristina Mclaughlin underwent robotic hiatal hernia repair.  She had some chest pain POD1 but workup was negative.  She felt better and went home POD2  Consults: None  Significant Diagnostic Studies: None  Treatments: surgery: as above  Disposition: Home  Patient Instructions:  Allergies as of 11/20/2022       Reactions   Lovenox [enoxaparin Sodium] Anaphylaxis   Moxifloxacin Anaphylaxis, Other (See Comments)   Quinolones Hives, Shortness Of Breath   Mucinex Sinus-max [phenylephrine-apap-guaifenesin] Swelling   "Red mucinex tablet"    Sulfamethoxazole-trimethoprim Other (See Comments)   Unknown reaction        Medication List     TAKE these medications    acetaminophen 500 MG tablet Commonly  known as: TYLENOL Take 1,000 mg by mouth every 6 (six) hours as needed for moderate pain.   albuterol 108 (90 Base) MCG/ACT inhaler Commonly known as: VENTOLIN HFA TAKE 2 PUFFS BY MOUTH EVERY 6 HOURS AS NEEDED FOR WHEEZE OR SHORTNESS OF BREATH   amLODipine 5 MG tablet Commonly known as: NORVASC TAKE 1 TABLET (5  MG TOTAL) BY MOUTH DAILY.   atorvastatin 40 MG tablet Commonly known as: LIPITOR TAKE 1 TABLET BY MOUTH EVERY DAY   Azelastine HCl 137 MCG/SPRAY Soln Place 1 spray into both nostrils daily as needed (congestion).   Biotin 10 MG Caps Take 10 mg by mouth daily.   BLINK TEARS OP Place 1 drop into both eyes daily.   budesonide-formoterol 80-4.5 MCG/ACT inhaler Commonly known as: Symbicort Take 2 puffs first thing in am and then another 2 puffs about 12 hours later.   buPROPion 150 MG 12 hr tablet Commonly known as: Wellbutrin SR Take 1 tablet (150 mg total) by mouth daily.   CALCIUM 500 PO Take 500 mg by mouth daily.   COLLAGEN PO Take 1 tablet by mouth daily.   diclofenac Sodium 1 % Gel Commonly known as: Voltaren Apply 2 g topically 4 (four) times daily as needed (arthritis).   EPINEPHrine 0.3 mg/0.3 mL Soaj injection Commonly known as: EPI-PEN Inject 0.3 mg into the muscle as needed for anaphylaxis.   fexofenadine 180 MG tablet Commonly known as: ALLEGRA Take 1 tablet (180 mg total) by mouth daily. What changed: when to take this   fluticasone 50 MCG/ACT nasal spray Commonly known as: FLONASE SPRAY 2 SPRAYS INTO EACH NOSTRIL EVERY DAY What changed: See the new instructions.   gabapentin 300 MG capsule Commonly known as: Neurontin Take 1 capsule (300 mg total) by mouth 4 (four) times daily. What changed: when to take this   ketoconazole 2 % shampoo Commonly known as: NIZORAL APPLY 1 APPLICATION TOPICALLY 2 (TWO) TIMES A WEEK. What changed:  when to take this reasons to take this   Linzess 72 MCG capsule Generic drug: linaclotide Take 72 mcg by mouth every other day.   Melatonin 5 MG Caps Take 5 mg by mouth at bedtime as needed (sleep).   meloxicam 7.5 MG tablet Commonly known as: MOBIC Take 7.5 mg by mouth daily as needed for pain.   MULTI-VITAMIN PO Take 1 tablet by mouth daily.   ondansetron 4 MG tablet Commonly known as: Zofran Take 1 tablet  (4 mg total) by mouth every 8 (eight) hours as needed for nausea or vomiting.   oxyCODONE-acetaminophen 5-325 MG tablet Commonly known as: Percocet Take 1 tablet by mouth every 4 (four) hours as needed for severe pain.   pantoprazole 40 MG tablet Commonly known as: PROTONIX Take 1 tablet (40 mg total) by mouth 2 (two) times daily before a meal.   PROBIOTIC PO Take 1 capsule by mouth daily.   sucralfate 1 g tablet Commonly known as: CARAFATE Take 1 tablet (1 g total) by mouth 4 (four) times daily -  before meals and at bedtime. What changed:  when to take this reasons to take this   SUMAtriptan 100 MG tablet Commonly known as: IMITREX TAKE 1 TABLET AS NEED FOR MIGRAINE. MAY REPEAT IN 2 HOURS IF HEADACHE PERSISTS OR RECURS.   traZODone 150 MG tablet Commonly known as: DESYREL TAKE 1 TABLET (150 MG TOTAL) BY MOUTH AT BEDTIME AS NEEDED FOR SLEEP.   trospium 20 MG tablet Commonly known as: SANCTURA TAKE 1 TABLET  BY MOUTH TWICE A DAY   Turmeric Curcumin 500 MG Caps Take 500 mg by mouth daily.   valsartan-hydrochlorothiazide 320-25 MG tablet Commonly known as: DIOVAN-HCT TAKE 1 TABLET BY MOUTH EVERY DAY   Vitamin D3 125 MCG (5000 UT) Caps Take 5,000 Units by mouth daily.        Activity: no heavy lifting for 4 weeks Diet:  Liquid diet for 2 weeks, soft diet for 2 weeks then normal diet at 4 week mark Wound Care: keep wound clean and dry  Follow-up:  With Dr. Dossie Der  Signed: Hyman Mclaughlin Audria Takeshita General, Bariatric, & Minimally Invasive Surgery [facility], [place]   11/20/2022, 12:04 PM

## 2022-11-21 ENCOUNTER — Telehealth: Payer: Self-pay

## 2022-11-21 NOTE — Anesthesia Postprocedure Evaluation (Signed)
Anesthesia Post Note  Patient: Kristina Mclaughlin  Procedure(s) Performed: ROBOTIC HIATAL HERNIA REPAIR     Patient location during evaluation: PACU Anesthesia Type: General Level of consciousness: awake and alert Pain management: pain level controlled Vital Signs Assessment: post-procedure vital signs reviewed and stable Respiratory status: spontaneous breathing, nonlabored ventilation, respiratory function stable and patient connected to nasal cannula oxygen Cardiovascular status: blood pressure returned to baseline and stable Postop Assessment: no apparent nausea or vomiting Anesthetic complications: no  No notable events documented.  Last Vitals:  Vitals:   11/20/22 0434 11/20/22 0929  BP: (!) 172/69 (!) 140/66  Pulse: 79 85  Resp:  18  Temp:  37 C  SpO2:  90%    Last Pain:  Vitals:   11/20/22 0929  TempSrc: Oral  PainSc:                  Mariah Harn L Rudell Marlowe

## 2022-11-21 NOTE — Transitions of Care (Post Inpatient/ED Visit) (Signed)
   11/21/2022  Name: Kristina Mclaughlin MRN: [id number] DOB: 1951-07-05  Today's TOC FU Call Status: Today's TOC FU Call Status:: Successful TOC FU Call Competed TOC FU Call Complete Date: 11/21/22  Transition Care Management Follow-up Telephone Call Date of Discharge: 11/20/22 Discharge Facility: [facility]) Type of Discharge: Inpatient Admission Primary Inpatient Discharge Diagnosis:: Hiatal Hernia Repair How have you been since you were released from the hospital?: Better Any questions or concerns?: No  Items Reviewed: Did you receive and understand the discharge instructions provided?: Yes Medications obtained and verified?: Yes (Medications Reviewed) Any new allergies since your discharge?: No Dietary orders reviewed?: No Do you have support at home?: Yes People in Home: spouse Name of Support/Comfort Primary Source: [facility] and Equipment/Supplies: Were Home Health Services Ordered?: No Any new equipment or medical supplies ordered?: No  Functional Questionnaire: Do you need assistance with bathing/showering or dressing?: No Do you need assistance with meal preparation?: No Do you need assistance with eating?: No Do you have difficulty maintaining continence: No Do you need assistance with getting out of bed/getting out of a chair/moving?: No Do you have difficulty managing or taking your medications?: No  Follow up appointments reviewed: PCP Follow-up appointment confirmed?: NA Specialist Hospital Follow-up appointment confirmed?: Yes Date of Specialist follow-up appointment?: 12/17/22 Follow-Up Specialty Provider:: Dr. Dossie Der (surgeon) Do you need transportation to your follow-up appointment?: No Do you understand care options if your condition(s) worsen?: Yes-patient verbalized understanding   Interventions Today    Flowsheet Row Most Recent Value  Chronic Disease   Chronic disease during today's visit Other  [Hiatal Hernia Repair]   General Interventions   General Interventions Discussed/Reviewed General Interventions Discussed  Pharmacy Interventions   Pharmacy Dicussed/Reviewed Pharmacy Topics Discussed       Jodelle Gross, RN, BSN, CCM Care Management Coordinator [facility] Phone: [phone]/Fax: [phone]

## 2022-11-26 DIAGNOSIS — M1712 Unilateral primary osteoarthritis, left knee: Secondary | ICD-10-CM | POA: Diagnosis not present

## 2022-12-02 DIAGNOSIS — G5601 Carpal tunnel syndrome, right upper limb: Secondary | ICD-10-CM | POA: Diagnosis not present

## 2022-12-03 IMAGING — US US ABDOMEN LIMITED RUQ/ASCITES
1 series · 14 of 25 positions shown · non-contrast
Comparison: None.

CLINICAL DATA: Right upper and lower quadrant pain for 1 year.

EXAM:
ULTRASOUND ABDOMEN LIMITED RIGHT UPPER QUADRANT

[Series 1: us abdomen limited ruq/ascites · 0.20mm/px · 14 of 40 slices shown]
[im 1/40]
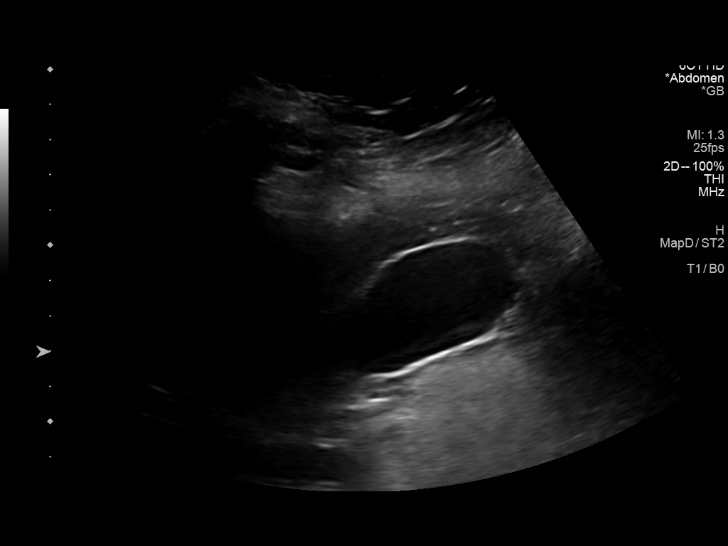
[im 4/40]
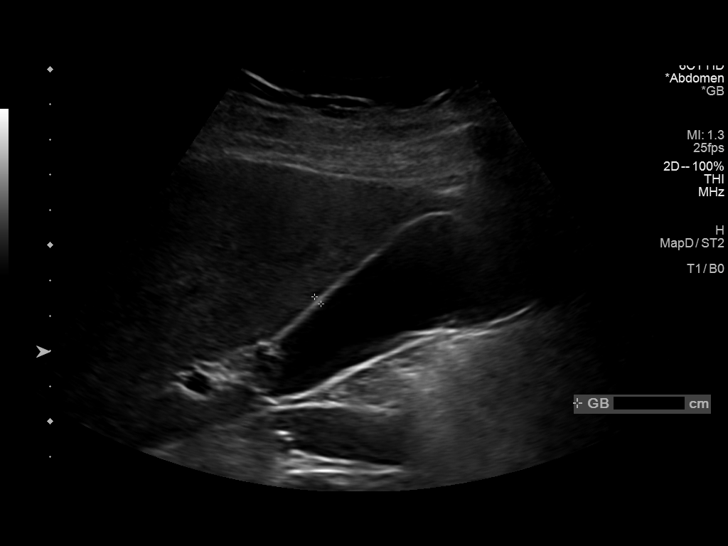
[im 7/40]
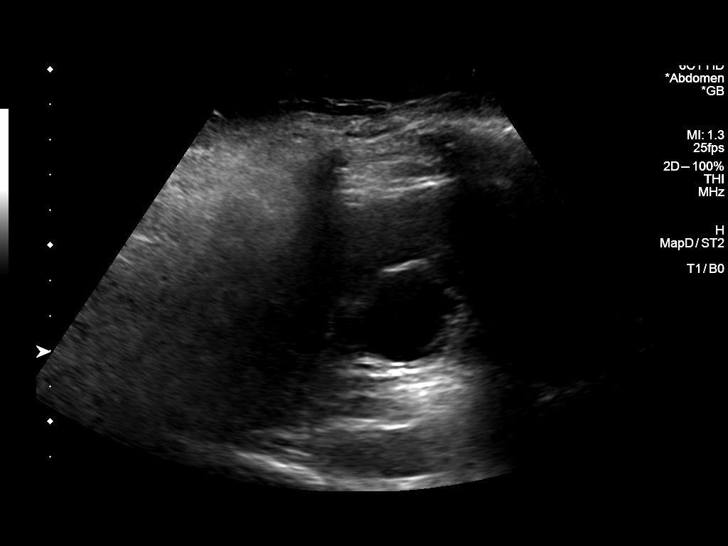
[im 10/40]
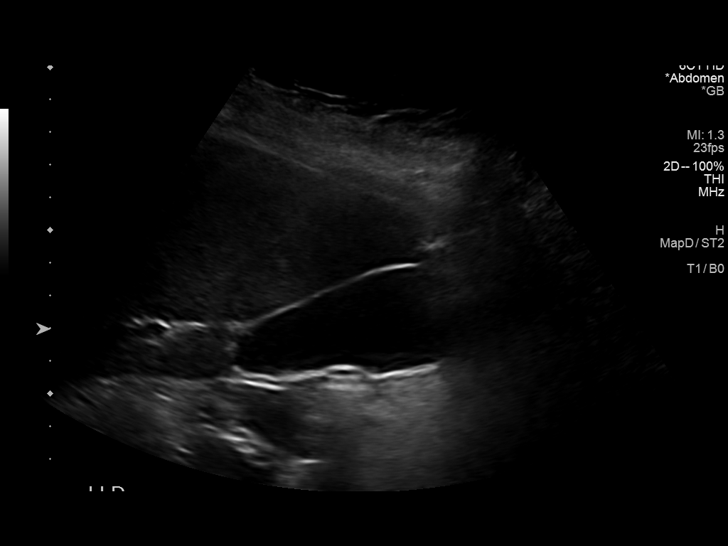
[im 14/40]
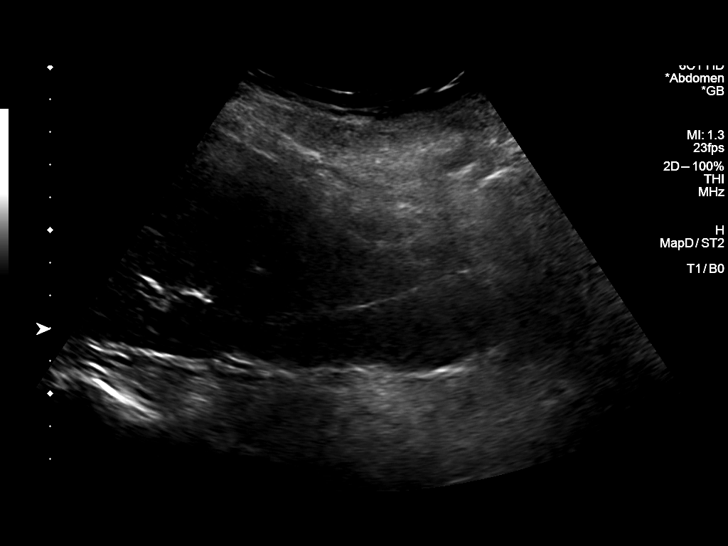
[im 15/40]
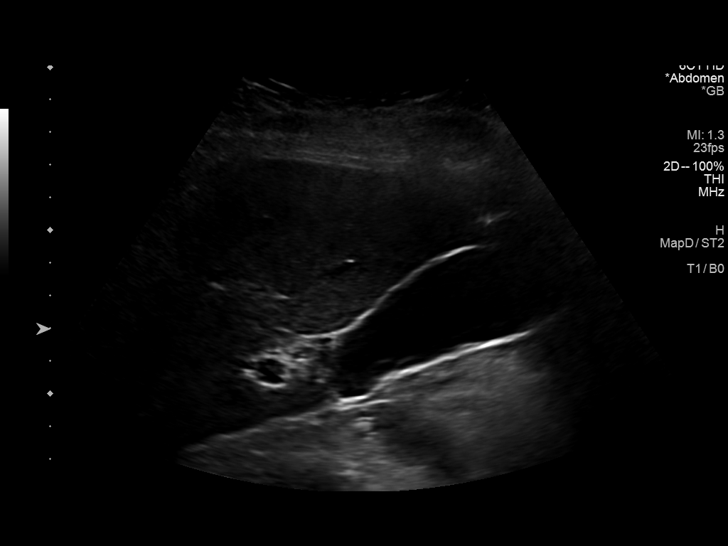
[im 18/40]
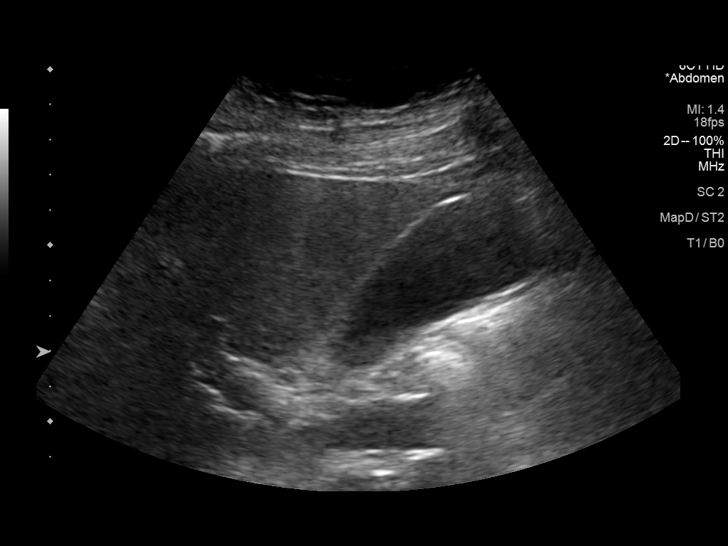
[im 22/40]
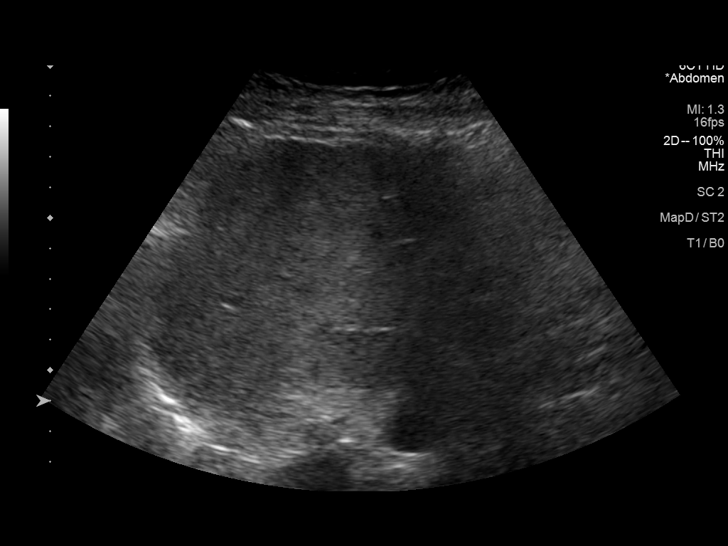
[im 25/40]
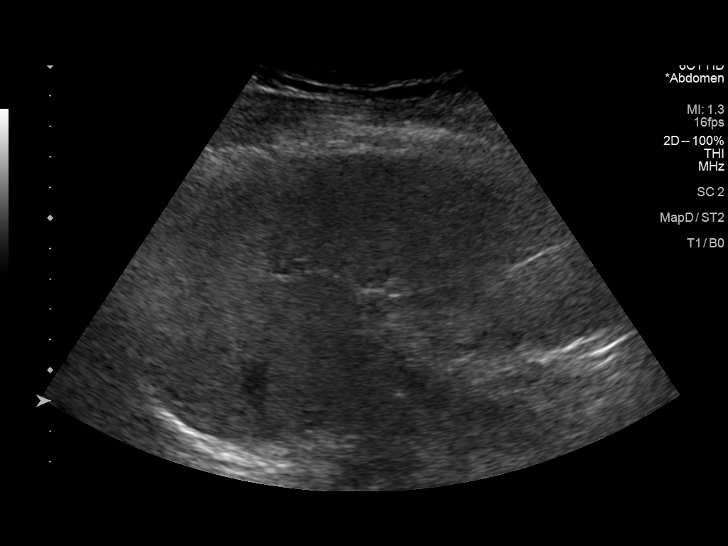
[im 27/40]
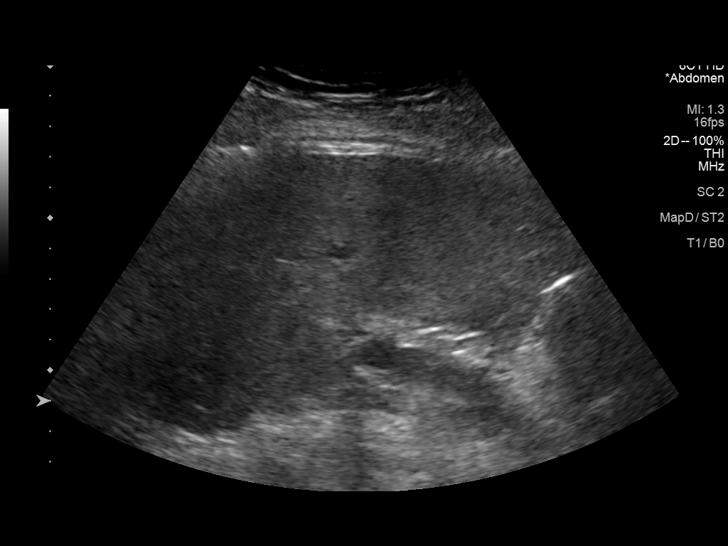
[im 30/40]
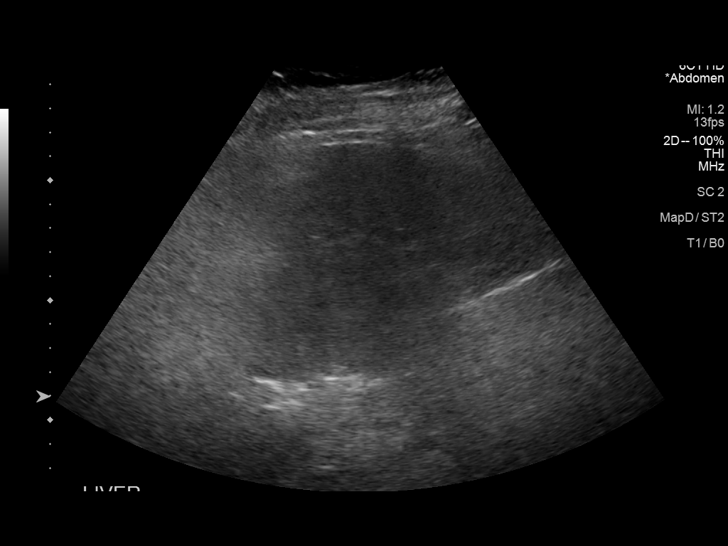
[im 33/40]
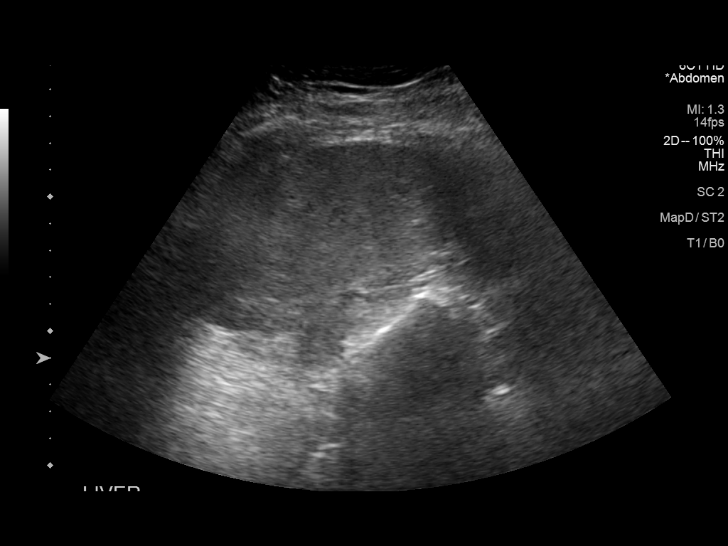
[im 36/40]
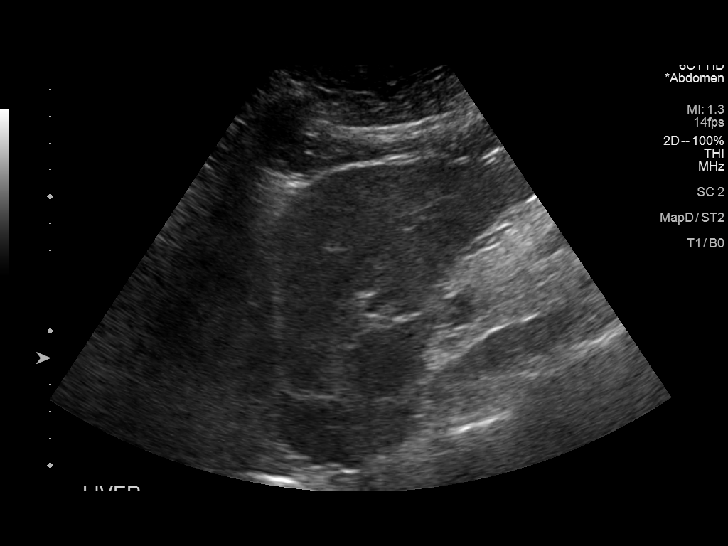
[im 40/40]
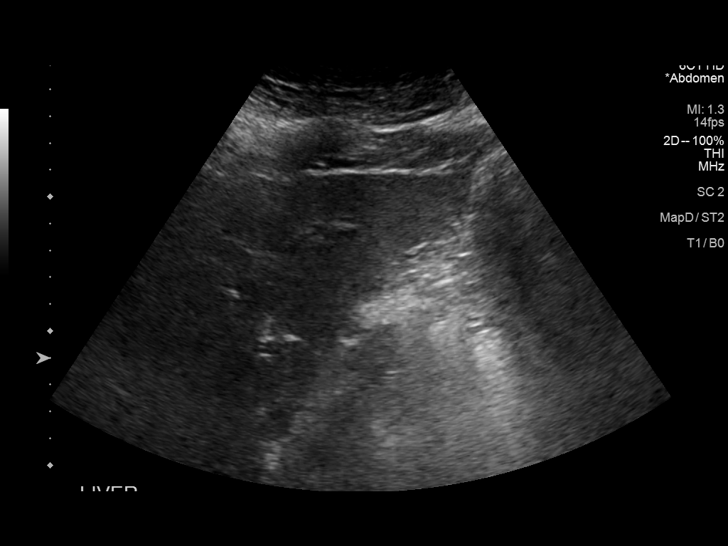

[14 of 25 positions shown; findings below may reference images not displayed]

FINDINGS: Gallbladder:

No gallstones or wall thickening visualized. No sonographic Murphy
sign noted by sonographer.

Common bile duct:

Diameter: 3 mm

Liver:

No focal lesion. Coarsening of parenchymal echotexture. Portal vein
is patent on color Doppler imaging with normal direction of blood
flow towards the liver.

Other: None.
IMPRESSION: Coarsened parenchymal echotexture is a nonspecific indicator of
hepatocellular dysfunction. No focal hepatic lesion is identified.

## 2022-12-04 DIAGNOSIS — M1712 Unilateral primary osteoarthritis, left knee: Secondary | ICD-10-CM | POA: Diagnosis not present

## 2022-12-04 DIAGNOSIS — J301 Allergic rhinitis due to pollen: Secondary | ICD-10-CM | POA: Diagnosis not present

## 2022-12-04 DIAGNOSIS — J3081 Allergic rhinitis due to animal (cat) (dog) hair and dander: Secondary | ICD-10-CM | POA: Diagnosis not present

## 2022-12-04 DIAGNOSIS — J3089 Other allergic rhinitis: Secondary | ICD-10-CM | POA: Diagnosis not present

## 2022-12-11 DIAGNOSIS — M1712 Unilateral primary osteoarthritis, left knee: Secondary | ICD-10-CM | POA: Diagnosis not present

## 2022-12-15 DIAGNOSIS — M5430 Sciatica, unspecified side: Secondary | ICD-10-CM | POA: Diagnosis not present

## 2022-12-15 DIAGNOSIS — R609 Edema, unspecified: Secondary | ICD-10-CM | POA: Diagnosis not present

## 2022-12-15 DIAGNOSIS — M8580 Other specified disorders of bone density and structure, unspecified site: Secondary | ICD-10-CM | POA: Diagnosis not present

## 2022-12-15 DIAGNOSIS — I129 Hypertensive chronic kidney disease with stage 1 through stage 4 chronic kidney disease, or unspecified chronic kidney disease: Secondary | ICD-10-CM | POA: Diagnosis not present

## 2022-12-15 DIAGNOSIS — E785 Hyperlipidemia, unspecified: Secondary | ICD-10-CM | POA: Diagnosis not present

## 2022-12-15 DIAGNOSIS — Z008 Encounter for other general examination: Secondary | ICD-10-CM | POA: Diagnosis not present

## 2022-12-15 DIAGNOSIS — E669 Obesity, unspecified: Secondary | ICD-10-CM | POA: Diagnosis not present

## 2022-12-15 DIAGNOSIS — G4700 Insomnia, unspecified: Secondary | ICD-10-CM | POA: Diagnosis not present

## 2022-12-15 DIAGNOSIS — K581 Irritable bowel syndrome with constipation: Secondary | ICD-10-CM | POA: Diagnosis not present

## 2022-12-15 DIAGNOSIS — M1990 Unspecified osteoarthritis, unspecified site: Secondary | ICD-10-CM | POA: Diagnosis not present

## 2022-12-15 DIAGNOSIS — N3281 Overactive bladder: Secondary | ICD-10-CM | POA: Diagnosis not present

## 2022-12-15 DIAGNOSIS — K219 Gastro-esophageal reflux disease without esophagitis: Secondary | ICD-10-CM | POA: Diagnosis not present

## 2022-12-15 DIAGNOSIS — G43909 Migraine, unspecified, not intractable, without status migrainosus: Secondary | ICD-10-CM | POA: Diagnosis not present

## 2022-12-22 DIAGNOSIS — G5601 Carpal tunnel syndrome, right upper limb: Secondary | ICD-10-CM | POA: Diagnosis not present

## 2022-12-22 DIAGNOSIS — J3089 Other allergic rhinitis: Secondary | ICD-10-CM | POA: Diagnosis not present

## 2022-12-22 DIAGNOSIS — J301 Allergic rhinitis due to pollen: Secondary | ICD-10-CM | POA: Diagnosis not present

## 2022-12-22 DIAGNOSIS — J3081 Allergic rhinitis due to animal (cat) (dog) hair and dander: Secondary | ICD-10-CM | POA: Diagnosis not present

## 2022-12-28 NOTE — Progress Notes (Unsigned)
Subjective:   Patient ID: Kristina Mclaughlin, female    DOB: 03-30-1951,    MRN: [id number]    Brief patient profile:  41 yowf  light smoker quit early 20's with onset also about the same time of rhinitis sping > fall rhinitis freq ov's never prednisone / mostly antihistamines some better over the years but tendency to bad colds not related to seasons rx with abx / cough med then better within a couple of weeks to a month before better for a year but starting Nov 2015 onset of head cold while on vacation in [place]  And coughing ever since so referred by Kristina Mclaughlin to pulmonary office 10/20/14   Allergy shots per Kristina Mclaughlin since ? 2016 and repeated in 2023 and less positive testing per pt    History of Present Illness  10/20/2014 1st [facility] office visit/ Kristina Mclaughlin   Chief Complaint  Patient presents with   Pulmonary Consult    Referred by Kristina. Christell Mclaughlin. Pt c/o cough on and off since Nov 2015. She states cough is worse at night and is occ prod with minimal clear sputum. Sometimes feels as if she can not take in a good, deep breath.   cough worse p supper also can't lie down due to cough and has lots of am drainage but nothing purulent Has had reflux for years maintained on ppi  rec Please see patient coordinator before you leave today  to schedule sinus CT and we will call you with results  First take delsym two tsp every 12 hours and supplement if needed with  Percocet  up to 2 every 4 hours to suppress the urge to cough at all or even clear your throat. Swallowing water or using ice chips/non mint and menthol containing candies (such as lifesavers or sugarless jolly ranchers) are also effective.  You should rest your voice and avoid activities that you know make you cough. Once you have eliminated the cough for 3 straight days try reducing the percocet   then the delsym as tolerated.   Prednisone  Taper off as you plan Protonix (pantoprazole) Take 30-60 min before first meal of the day and  take prilosec 30 min before supper and zantac 150 one bedtime plus chlorpheniramine 4 mg x 2 at bedtime (both available over the counter)  until cough is completely gone for at least a week without the need for cough suppression GERD diet  - sinus CT 11/06/2014 >>>  Acute/ chronic sinusitis > augmentin x 20 days    11/17/2014 f/u ov/Kristina Mclaughlin re: chronic cough "I've made 10 ov's  and no one has helped me" Chief Complaint  Patient presents with   Acute Visit    Pt states having increased cough for the past wk. In the am she is coughing up minimal green sputum.   cough never resolved but did not use percocet consistently yet cough was some better until 4/11 then 4/12 while on still on augmentin flared again same pattern as before with barking harsh quality 24/7 Rec zpak Prednisone 10 mg take  4 each am x 2 days,   2 each am x 2 days,  1 each am x 2 days and stop Take delsym (or mucinex dm 600 mg)  two tsp every 12 hours and supplement if needed with  Percocet  up to 2 every 4 hours  Once you have eliminated the cough for 3 straight days try reducing the percocet first,  then the delsym as tolerated.  Use flutter valve whenever you feel need to cough  Continue  Pantoprazole (protonix) 40 mg   Take 30-60 min before first meal of the day and zantac 150  one bedtime until return to office Keep appt with Kristina Mclaughlin and return here with all meds in hand if not satisfied after you finish with Kristina Kristina Mclaughlin > rec gabapentin resolved but recurred off it       06/20/2022  f/u ov/Kristina Mclaughlin re: cough x 2017 in setting or rhinitis   maint on gabapentin 500 mg per day   and getting confused between symb and saba  Chief Complaint  Patient presents with   Follow-up    Cough persistent.  Gabapentin helps with cough.  Dyspnea:  Not limited by breathing from desired activities   Cough: dry hack sporadic  Sleeping: no resp cc  on 30 degrees elevated hob  and does fine  SABA use: confused with saba vs symbicort,  ? When stopped  symbicort  ? 02: none  Rec Plan A = Automatic = Always=    Symbicort 80 Take 2 puffs first thing in am and then another 2 puffs about 12 hours later. Work on inhaler technique:  Plan B = Backup (to supplement plan A, not to replace it) Only use your albuterol inhaler as a rescue medication  Gabapentin 300 mg twice daily      09/22/2022  f/u ov/Kristina Mclaughlin re: cough 2017  maint on flonase twice daily / 300 mg gabapentin bid with still breakthru cough when "catches a cold or flu" assoc first with worse pnds then cough to point of gag  Chief Complaint  Patient presents with   Follow-up    Cough improved, but if gets any virus, cough returns.  Dyspnea:  not limited  Cough: some this am p blew nose and then coughed to gagging but not vomit x 30 min Sleeping: no noct cough SABA use: not using / not using symbicort 80 1st thing as instructed  02: none  Covid status:   vax two / infected x 3  Rec Change Symbicort 80 Take 2 puffs first thing in am and then another 2 puffs about 12 hours later.  Increase gabapentin 300 mg four times a day until no cough x 2 weeks then taper back to 2 daily  Bring your albuterol and your symbicort 80 with you to the next office    12/30/2022  f/u ov/Kristina Mclaughlin re: cough x 2017 maint on symbicort 80 upt ot 2 bid and gabapentin down to 300 mg bid -  did  bring inhalers Chief Complaint  Patient presents with   Follow-up    Doing well. Discuss d/c Gabapentin.  Dyspnea:  limited by R knee Cough: resolved  on 300 mg bid gabapentin though still gets choked a bit on her own saliva/ or if singing/ no excess mucus or dysphagia. Sleeping: around 30 degrees due to back  SABA use: none  02: none     No obvious day to day or daytime variability or assoc excess/ purulent sputum or mucus plugs or hemoptysis or cp or chest tightness, subjective wheeze or overt  hb symptoms.   Sleeping  without nocturnal  or early am exacerbation  of respiratory  c/o's or need for noct saba. Also  denies any obvious fluctuation of symptoms with weather or environmental changes or other aggravating or alleviating factors except as outlined above   No unusual exposure hx or h/o childhood pna/ asthma or knowledge of premature birth.  Current Allergies, Complete Past Medical History, Past Surgical History, Family History, and Social History were reviewed in [employer] record.  ROS  The following are not active complaints unless bolded Hoarseness, sore throat, dysphagia, dental problems, itching, sneezing,  nasal congestion or discharge of excess mucus or purulent secretions, ear ache,   fever, chills, sweats, unintended wt loss or wt gain, classically pleuritic or exertional cp,  orthopnea pnd or arm/hand swelling  or leg swelling, presyncope, palpitations, abdominal pain, anorexia, nausea, vomiting, diarrhea  or change in bowel habits or change in bladder habits, change in stools or change in urine, dysuria, hematuria,  rash, arthralgias, visual complaints, headache, numbness, weakness or ataxia or problems with walking or coordination,  change in mood or  memory.        Current Meds  Medication Sig   acetaminophen (TYLENOL) 500 MG tablet Take 1,000 mg by mouth every 6 (six) hours as needed for moderate pain.   albuterol (VENTOLIN HFA) 108 (90 Base) MCG/ACT inhaler TAKE 2 PUFFS BY MOUTH EVERY 6 HOURS AS NEEDED FOR WHEEZE OR SHORTNESS OF BREATH   amLODipine (NORVASC) 5 MG tablet TAKE 1 TABLET (5 MG TOTAL) BY MOUTH DAILY.   atorvastatin (LIPITOR) 40 MG tablet TAKE 1 TABLET BY MOUTH EVERY DAY   Azelastine HCl 137 MCG/SPRAY SOLN Place 1 spray into both nostrils daily as needed (congestion).   Biotin 10 MG CAPS Take 10 mg by mouth daily.   budesonide-formoterol (SYMBICORT) 80-4.5 MCG/ACT inhaler Take 2 puffs first thing in am and then another 2 puffs about 12 hours later.   buPROPion (WELLBUTRIN SR) 150 MG 12 hr tablet Take 1 tablet (150 mg total) by mouth daily.   Calcium  Carbonate (CALCIUM 500 PO) Take 500 mg by mouth daily.   Cholecalciferol (VITAMIN D3) 125 MCG (5000 UT) capsule Take 5,000 Units by mouth daily.   COLLAGEN PO Take 1 tablet by mouth daily.   diclofenac Sodium (VOLTAREN) 1 % GEL Apply 2 g topically 4 (four) times daily as needed (arthritis).   EPINEPHrine 0.3 mg/0.3 mL IJ SOAJ injection Inject 0.3 mg into the muscle as needed for anaphylaxis.   fexofenadine (ALLEGRA) 180 MG tablet Take 1 tablet (180 mg total) by mouth daily. (Patient taking differently: Take 180 mg by mouth at bedtime.)   fluticasone (FLONASE) 50 MCG/ACT nasal spray SPRAY 2 SPRAYS INTO EACH NOSTRIL EVERY DAY (Patient taking differently: Place 1 spray into both nostrils 2 (two) times daily.)   gabapentin (NEURONTIN) 300 MG capsule Take 1 capsule (300 mg total) by mouth 4 (four) times daily. (Patient taking differently: Take 300 mg by mouth 2 (two) times daily.)   ketoconazole (NIZORAL) 2 % shampoo APPLY 1 APPLICATION TOPICALLY 2 (TWO) TIMES A WEEK. (Patient taking differently: Apply 1 Application topically daily as needed for irritation.)   linaclotide (LINZESS) 72 MCG capsule Take 72 mcg by mouth every other day.   Melatonin 5 MG CAPS Take 5 mg by mouth at bedtime as needed (sleep).   Multiple Vitamin (MULTI-VITAMIN PO) Take 1 tablet by mouth daily.    pantoprazole (PROTONIX) 40 MG tablet Take 1 tablet (40 mg total) by mouth 2 (two) times daily before a meal.   Polyethylene Glycol 400 (BLINK TEARS OP) Place 1 drop into both eyes daily.   Probiotic Product (PROBIOTIC PO) Take 1 capsule by mouth daily.   sucralfate (CARAFATE) 1 g tablet Take 1 tablet (1 g total) by mouth 4 (four) times daily -  before meals and  at bedtime. (Patient taking differently: Take 1 g by mouth 4 (four) times daily as needed (heartburn).)   SUMAtriptan (IMITREX) 100 MG tablet TAKE 1 TABLET AS NEED FOR MIGRAINE. MAY REPEAT IN 2 HOURS IF HEADACHE PERSISTS OR RECURS.   traZODone (DESYREL) 150 MG tablet TAKE 1  TABLET (150 MG TOTAL) BY MOUTH AT BEDTIME AS NEEDED FOR SLEEP.   trospium (SANCTURA) 20 MG tablet TAKE 1 TABLET BY MOUTH TWICE A DAY   Turmeric Curcumin 500 MG CAPS Take 500 mg by mouth daily.   valsartan-hydrochlorothiazide (DIOVAN-HCT) 320-25 MG tablet TAKE 1 TABLET BY MOUTH EVERY DAY                 Objective:   Physical Exam   Wts  12/30/2022        234   09/22/2022        248  06/20/2022       241    05/23/2022      235   11/17/2014        230     10/20/14 226 lb (102.513 kg)  10/17/14 227 lb (102.967 kg)  10/10/14 225 lb (102.059 kg)     Vital signs reviewed  12/30/2022  - Note at rest 02 sats  95% on RA   General appearance:    amb wf easily choked during conversations/ better p jolly rancher   HEENT : Oropharynx  clear     NECK :  without  apparent JVD/ palpable Nodes/TM    LUNGS: no acc muscle use,  Nl contour chest which is clear to A and P bilaterally without cough on insp or exp maneuvers   CV:  RRR  no s3 or murmur or increase in P2, and no edema   ABD:  soft and nontender with nl inspiratory excursion in the supine position. No bruits or organomegaly appreciated   MS:  Nl gait/ ext warm without deformities Or obvious joint restrictions  calf tenderness, cyanosis or clubbing    SKIN: warm and dry without lesions    NEURO:  alert, approp, nl sensorium with  no motor or cerebellar deficits apparent.         Assessment & Plan:

## 2022-12-30 ENCOUNTER — Ambulatory Visit: Payer: Medicare HMO | Admitting: Internal Medicine

## 2022-12-30 ENCOUNTER — Encounter: Payer: Self-pay | Admitting: Internal Medicine

## 2022-12-30 VITALS — BP 128/76 | HR 75 | Temp 98.4°F | Ht 69.0 in | Wt 234.0 lb

## 2022-12-30 DIAGNOSIS — R058 Other specified cough: Secondary | ICD-10-CM | POA: Diagnosis not present

## 2022-12-30 DIAGNOSIS — J45991 Cough variant asthma: Secondary | ICD-10-CM

## 2022-12-30 NOTE — Assessment & Plan Note (Addendum)
On background of allergic rhinitis on immunotherapy per Barnetta Chapel since ? 2016  - FENO during flare of cough 05/23/2022 = 40  On symb 80 2bid though hfa suboptimal - FENO off symbicort  06/20/2022  = 85 > resume symbicort 80 2bid - FENO 09/22/2022  =  42   on symb 80 2bid  - 12/30/2022  After extensive coaching inhaler device,  effectiveness =   80% smi > continue  symb 80 upt to  2bid   All goals of chronic asthma control met including optimal function and elimination of symptoms with minimal need for rescue therapy.  Contingencies discussed in full including contacting this office immediately if not controlling the symptoms using the rule of two's.     F/u can be prn as also f/b dr Barnetta Chapel on allergy shots         Each maintenance medication was reviewed in detail including emphasizing most importantly the difference between maintenance and prns and under what circumstances the prns are to be triggered using an action plan format where appropriate.  Total time for H and P, chart review, counseling,   and generating customized AVS unique to this office visit / same day charting = 25 min

## 2022-12-30 NOTE — Patient Instructions (Addendum)
Try gabapentin in evening = 300 mg daily about an hour before bed - after a month ok to try off completely but if flares ok to go back to 4 x daily   Verdie Mosher, SLP  [facility]  [address]  [phone]     Follow up here can be as needed

## 2022-12-30 NOTE — Assessment & Plan Note (Signed)
Onset 2015 on a background of allergic rhinitis onset in her  20's -Allergy shots per Barnetta Chapel since ? 2016 - Hiatal hernia small 10/2014 and much larger by 02/14/22  - sinus CT 11/06/2014 >>>  Acute/ chronic sinusitis > augmentin x 20 days> f/u [facility] then return to pulmonary when [facility] eval complete> rx gabapentin 100% elimination of cough while on it, relapsed shortly p off but never back to [place] - restarted gabapenitn titrate to max of 300 mg qid or whatever dose controls cough  - changed gabapentin 300 mg twice daily 06/20/2022 >>>  - HRCT 07/02/22  1. No evidence of interstitial lung disease. 2. Moderate to large hiatal hernia. 3. Dilated main pulmonary artery, suggesting pulmonary arterial hypertension. 4. Three-vessel coronary atherosclerosis. 5. Trace pericardial effusion. 6.  Aortic Atherosclerosis (ICD10-I70.0) Rec:  Echo to eval for PH - sinus CT 07/02/22 c/w chronic sinusitis > ent eval > rx maint flonase/astelin   Has ent f/u - in meatnime can try gabapentin taper down/off x 1 month and f/u with ENT / ST either in [place] or [facility]

## 2023-01-02 DIAGNOSIS — S92415A Nondisplaced fracture of proximal phalanx of left great toe, initial encounter for closed fracture: Secondary | ICD-10-CM | POA: Diagnosis not present

## 2023-01-02 DIAGNOSIS — M79672 Pain in left foot: Secondary | ICD-10-CM | POA: Diagnosis not present

## 2023-01-02 DIAGNOSIS — M2012 Hallux valgus (acquired), left foot: Secondary | ICD-10-CM | POA: Diagnosis not present

## 2023-01-05 ENCOUNTER — Other Ambulatory Visit: Payer: Self-pay | Admitting: Family Medicine

## 2023-01-05 DIAGNOSIS — I10 Essential (primary) hypertension: Secondary | ICD-10-CM

## 2023-01-05 DIAGNOSIS — E781 Pure hyperglyceridemia: Secondary | ICD-10-CM

## 2023-01-06 DIAGNOSIS — G5601 Carpal tunnel syndrome, right upper limb: Secondary | ICD-10-CM | POA: Diagnosis not present

## 2023-01-15 ENCOUNTER — Ambulatory Visit (INDEPENDENT_AMBULATORY_CARE_PROVIDER_SITE_OTHER): Payer: Medicare HMO | Admitting: Nurse Practitioner

## 2023-01-15 ENCOUNTER — Encounter: Payer: Self-pay | Admitting: Nurse Practitioner

## 2023-01-15 ENCOUNTER — Ambulatory Visit (HOSPITAL_COMMUNITY)
Admission: RE | Admit: 2023-01-15 | Discharge: 2023-01-15 | Disposition: A | Discharge status: Home / Self Care | Payer: Medicare HMO | Source: Ambulatory Visit | Attending: Nurse Practitioner | Admitting: Nurse Practitioner

## 2023-01-15 VITALS — BP 136/84 | HR 71 | Temp 97.3°F | Resp 20 | Ht 69.0 in | Wt 232.0 lb

## 2023-01-15 DIAGNOSIS — W19XXXA Unspecified fall, initial encounter: Secondary | ICD-10-CM

## 2023-01-15 DIAGNOSIS — R519 Headache, unspecified: Secondary | ICD-10-CM | POA: Diagnosis not present

## 2023-01-15 DIAGNOSIS — S0990XA Unspecified injury of head, initial encounter: Secondary | ICD-10-CM | POA: Diagnosis not present

## 2023-01-15 NOTE — Progress Notes (Signed)
Subjective:    Patient ID: Kristina Mclaughlin, female    DOB: January 02, 1951, 72 y.o.   MRN: [id number]   Chief Complaint: Fall   Patient fell at [facility] and hit her face. She is c/o headache every since. No nausea and vomiting.   Fall The accident occurred 3 to 5 days ago. The fall occurred while walking. She fell from a height of 6 to 10 ft. She landed on Hard floor. There was no blood loss. The point of impact was the head and right knee (hit head on a rae track on floor). The pain is present in the head, right knee and left knee. The pain is at a severity of 8/10. The pain is moderate. Associated symptoms include headaches. Pertinent negatives include no abdominal pain. She has tried acetaminophen for the symptoms. The treatment provided mild relief.    Patient Active Problem List   Diagnosis Date Noted   Hiatal hernia 11/18/2022   Acid reflux 02/10/2022   Chronic GERD 02/10/2022   Gastroesophageal reflux disease 02/10/2022   Chronic superficial gastritis without bleeding 02/10/2022   Fatty liver 02/10/2022   Rectal pain 02/10/2022   Rectal bleeding 02/10/2022   Allergic rhinitis due to animal (cat) (dog) hair and dander 01/20/2022   Cough variant asthma 01/20/2022   Chronic constipation 09/18/2021   Neuropathic pain 08/22/2021   Lipoma    Lipoma of foot 06/11/2021   Lipoma of upper arm 06/11/2021   Acute pain of left knee 05/27/2021   Other chest pain 05/27/2021   Sensorineural hearing loss (SNHL) of left ear with unrestricted hearing of right ear 12/11/2020   Spitting suture 04/03/2020   Lipoma of abdominal wall 12/15/2019   Breast lipoma 12/15/2019   Lipoma of both lower extremities 12/15/2019   Tear of medial meniscus of knee 09/06/2018   Pain in right knee 05/27/2018   Primary insomnia 05/11/2018   Unilateral primary osteoarthritis, left knee 05/11/2018   Hemorrhoids 01/22/2018   Allergic rhinitis 10/29/2015   Essential hypertension 10/29/2015   Upper  airway cough syndrome 10/20/2014       Review of Systems  Constitutional:  Negative for diaphoresis.  HENT:  Positive for facial swelling.   Eyes:  Negative for photophobia, pain and visual disturbance.  Respiratory:  Negative for shortness of breath.   Cardiovascular:  Negative for chest pain, palpitations and leg swelling.  Gastrointestinal:  Negative for abdominal pain.  Endocrine: Negative for polydipsia.  Skin:  Negative for rash.  Neurological:  Positive for headaches. Negative for weakness.  Hematological:  Does not bruise/bleed easily.  All other systems reviewed and are negative.      Objective:   Physical Exam Vitals reviewed.  Constitutional:      Appearance: Normal appearance.  Cardiovascular:     Rate and Rhythm: Normal rate and regular rhythm.     Heart sounds: Normal heart sounds.  Pulmonary:     Breath sounds: Normal breath sounds.  Skin:    General: Skin is warm.  Neurological:     General: No focal deficit present.     Mental Status: She is alert and oriented to person, place, and time.     Cranial Nerves: No cranial nerve deficit.     Sensory: No sensory deficit.  Psychiatric:        Mood and Affect: Mood normal.        Behavior: Behavior normal.     BP 136/84   Pulse 71   Temp (!) 97.3  F (36.3 C) (Temporal)   Resp 20   Ht 5\' 9"  (1.753 m)   Wt 232 lb (105.2 kg)   SpO2 97%   BMI 34.26 kg/m        Assessment & Plan:  [name] in today with chief complaint of Fall   1. Traumatic injury of head, initial encounter Watch for signs of ICP- personality changes, nausea and vomiting - CT HEAD WO CONTRAST ( ); Future  2. Fall, initial encounter Fall prevention    The above assessment and management plan was discussed with the patient. The patient verbalized understanding of and has agreed to the management plan. Patient is aware to call the clinic if symptoms persist or worsen. Patient is aware when to return to the  clinic for a follow-up visit. Patient educated on when it is appropriate to go to the emergency department.   Kristina Daphine Deutscher, FNP

## 2023-01-15 NOTE — Patient Instructions (Signed)
Fall Prevention in the Home, Adult Falls can cause injuries and can happen to people of all ages. There are many things you can do to make your home safer and to help prevent falls. What actions can I take to prevent falls? General information Use good lighting in all rooms. Make sure to: Replace any light bulbs that burn out. Turn on the lights in dark areas and use night-lights. Keep items that you use often in easy-to-reach places. Lower the shelves around your home if needed. Move furniture so that there are clear paths around it. Do not use throw rugs or other things on the floor that can make you trip. If any of your floors are uneven, fix them. Add color or contrast paint or tape to clearly mark and help you see: Grab bars or handrails. First and last steps of staircases. Where the edge of each step is. If you use a ladder or stepladder: Make sure that it is fully opened. Do not climb a closed ladder. Make sure the sides of the ladder are locked in place. Have someone hold the ladder while you use it. Know where your pets are as you move through your home. What can I do in the bathroom?     Keep the floor dry. Clean up any water on the floor right away. Remove soap buildup in the bathtub or shower. Buildup makes bathtubs and showers slippery. Use non-skid mats or decals on the floor of the bathtub or shower. Attach bath mats securely with double-sided, non-slip rug tape. If you need to sit down in the shower, use a non-slip stool. Install grab bars by the toilet and in the bathtub and shower. Do not use towel bars as grab bars. What can I do in the bedroom? Make sure that you have a light by your bed that is easy to reach. Do not use any sheets or blankets on your bed that hang to the floor. Have a firm chair or bench with side arms that you can use for support when you get dressed. What can I do in the kitchen? Clean up any spills right away. If you need to reach something  above you, use a step stool with a grab bar. Keep electrical cords out of the way. Do not use floor polish or wax that makes floors slippery. What can I do with my stairs? Do not leave anything on the stairs. Make sure that you have a light switch at the top and the bottom of the stairs. Make sure that there are handrails on both sides of the stairs. Fix handrails that are broken or loose. Install non-slip stair treads on all your stairs if they do not have carpet. Avoid having throw rugs at the top or bottom of the stairs. Choose a carpet that does not hide the edge of the steps on the stairs. Make sure that the carpet is firmly attached to the stairs. Fix carpet that is loose or worn. What can I do on the outside of my home? Use bright outdoor lighting. Fix the edges of walkways and driveways and fix any cracks. Clear paths of anything that can make you trip, such as tools or rocks. Add color or contrast paint or tape to clearly mark and help you see anything that might make you trip as you walk through a door, such as a raised step or threshold. Trim any bushes or trees on paths to your home. Check to see if handrails are loose   or broken and that both sides of all steps have handrails. Install guardrails along the edges of any raised decks and porches. Have leaves, snow, or ice cleared regularly. Use sand, salt, or ice melter on paths if you live where there is ice and snow during the winter. Clean up any spills in your garage right away. This includes grease or oil spills. What other actions can I take? Review your medicines with your doctor. Some medicines can cause dizziness or changes in blood pressure, which increase your risk of falling. Wear shoes that: Have a low heel. Do not wear high heels. Have rubber bottoms and are closed at the toe. Feel good on your feet and fit well. Use tools that help you move around if needed. These include: Canes. Walkers. Scooters. Crutches. Ask  your doctor what else you can do to help prevent falls. This may include seeing a physical therapist to learn to do exercises to move better and get stronger. Where to find more information Centers for Disease Control and Prevention, [employer]: cdc.gov National Institute on Aging: nia.nih.gov National Institute on Aging: nia.nih.gov Contact a doctor if: You are afraid of falling at home. You feel weak, drowsy, or dizzy at home. You fall at home. Get help right away if you: Lose consciousness or have trouble moving after a fall. Have a fall that causes a head injury. These symptoms may be an emergency. Get help right away. Call 911. Do not wait to see if the symptoms will go away. Do not drive yourself to the hospital. This information is not intended to replace advice given to you by your health care provider. Make sure you discuss any questions you have with your health care provider. Document Revised: 03/24/2022 Document Reviewed: 03/24/2022 Elsevier Patient Education  2024 [employer].  

## 2023-01-16 DIAGNOSIS — J301 Allergic rhinitis due to pollen: Secondary | ICD-10-CM | POA: Diagnosis not present

## 2023-01-16 DIAGNOSIS — J3089 Other allergic rhinitis: Secondary | ICD-10-CM | POA: Diagnosis not present

## 2023-01-16 DIAGNOSIS — J3081 Allergic rhinitis due to animal (cat) (dog) hair and dander: Secondary | ICD-10-CM | POA: Diagnosis not present

## 2023-01-22 DIAGNOSIS — M1712 Unilateral primary osteoarthritis, left knee: Secondary | ICD-10-CM | POA: Diagnosis not present

## 2023-01-23 ENCOUNTER — Ambulatory Visit (INDEPENDENT_AMBULATORY_CARE_PROVIDER_SITE_OTHER): Payer: Medicare HMO

## 2023-01-23 VITALS — Ht 69.0 in | Wt 231.0 lb

## 2023-01-23 DIAGNOSIS — Z0000 Encounter for general adult medical examination without abnormal findings: Secondary | ICD-10-CM | POA: Diagnosis not present

## 2023-01-23 NOTE — Progress Notes (Signed)
Subjective:   Kristina Mclaughlin is a 72 y.o. female who presents for Medicare Annual (Subsequent) preventive examination.  Visit Complete: Virtual  I connected with  Kristina Mclaughlin on 01/23/23 by a audio enabled telemedicine application and verified that I am speaking with the correct person using two identifiers.  Patient Location: Home  Provider Location: Home Office  I discussed the limitations of evaluation and management by telemedicine. The patient expressed understanding and agreed to proceed.  Patient Medicare AWV questionnaire was completed by the patient on 01/23/2023; I have confirmed that all information answered by patient is correct and no changes since this date.  Review of Systems     Cardiac Risk Factors include: advanced age (>58men, >3 women);hypertension;dyslipidemia     Objective:    Today's Vitals   01/23/23 0950  Weight: 231 lb (104.8 kg)  Height: 5\' 9"  (1.753 m)   Body mass index is 34.11 kg/m.     01/23/2023    9:56 AM 11/18/2022   12:36 PM 11/14/2022   11:25 AM 01/21/2022   11:23 AM 12/25/2021    4:19 PM 07/03/2021    6:33 AM 07/01/2021   10:04 AM  Advanced Directives  Does Patient Have a Medical Advance Directive? Yes Yes Yes Yes No Yes Yes  Type of Estate agent of [place];Living will Healthcare Power of [place];Living will Healthcare Power of [place];Living will Healthcare Power of [place];Living will  Healthcare Power of [place];Living will   Does patient want to make changes to medical advance directive?  No - Patient declined    No - Patient declined No - Patient declined  Copy of Healthcare Power of Attorney in Chart? No - copy requested No - copy requested No - copy requested No - copy requested  No - copy requested     Current Medications (verified) Outpatient Encounter Medications as of 01/23/2023  Medication Sig   acetaminophen (TYLENOL) 500 MG tablet Take 1,000 mg by mouth every 6 (six)  hours as needed for moderate pain.   albuterol (VENTOLIN HFA) 108 (90 Base) MCG/ACT inhaler TAKE 2 PUFFS BY MOUTH EVERY 6 HOURS AS NEEDED FOR WHEEZE OR SHORTNESS OF BREATH   amLODipine (NORVASC) 5 MG tablet TAKE 1 TABLET (5 MG TOTAL) BY MOUTH DAILY.   atorvastatin (LIPITOR) 40 MG tablet TAKE 1 TABLET BY MOUTH EVERY DAY   Azelastine HCl 137 MCG/SPRAY SOLN Place 1 spray into both nostrils daily as needed (congestion).   Biotin 10 MG CAPS Take 10 mg by mouth daily.   budesonide-formoterol (SYMBICORT) 80-4.5 MCG/ACT inhaler Take 2 puffs first thing in am and then another 2 puffs about 12 hours later.   buPROPion (WELLBUTRIN SR) 150 MG 12 hr tablet Take 1 tablet (150 mg total) by mouth daily.   Calcium Carbonate (CALCIUM 500 PO) Take 500 mg by mouth daily.   Cholecalciferol (VITAMIN D3) 125 MCG (5000 UT) capsule Take 5,000 Units by mouth daily.   COLLAGEN PO Take 1 tablet by mouth daily.   diclofenac Sodium (VOLTAREN) 1 % GEL Apply 2 g topically 4 (four) times daily as needed (arthritis).   EPINEPHrine 0.3 mg/0.3 mL IJ SOAJ injection Inject 0.3 mg into the muscle as needed for anaphylaxis.   fexofenadine (ALLEGRA) 180 MG tablet Take 1 tablet (180 mg total) by mouth daily. (Patient taking differently: Take 180 mg by mouth at bedtime.)   fluticasone (FLONASE) 50 MCG/ACT nasal spray SPRAY 2 SPRAYS INTO EACH NOSTRIL EVERY DAY (Patient taking differently: Place 1 spray  into both nostrils 2 (two) times daily.)   gabapentin (NEURONTIN) 300 MG capsule Take 1 capsule (300 mg total) by mouth 4 (four) times daily. (Patient taking differently: Take 300 mg by mouth 2 (two) times daily.)   ketoconazole (NIZORAL) 2 % shampoo APPLY 1 APPLICATION TOPICALLY 2 (TWO) TIMES A WEEK. (Patient taking differently: Apply 1 Application topically daily as needed for irritation.)   linaclotide (LINZESS) 72 MCG capsule Take 72 mcg by mouth every other day.   Melatonin 5 MG CAPS Take 5 mg by mouth at bedtime as needed (sleep).    Multiple Vitamin (MULTI-VITAMIN PO) Take 1 tablet by mouth daily.    Polyethylene Glycol 400 (BLINK TEARS OP) Place 1 drop into both eyes daily.   Probiotic Product (PROBIOTIC PO) Take 1 capsule by mouth daily.   sucralfate (CARAFATE) 1 g tablet Take 1 tablet (1 g total) by mouth 4 (four) times daily -  before meals and at bedtime. (Patient taking differently: Take 1 g by mouth 4 (four) times daily as needed (heartburn).)   SUMAtriptan (IMITREX) 100 MG tablet TAKE 1 TABLET AS NEED FOR MIGRAINE. MAY REPEAT IN 2 HOURS IF HEADACHE PERSISTS OR RECURS.   traZODone (DESYREL) 150 MG tablet TAKE 1 TABLET (150 MG TOTAL) BY MOUTH AT BEDTIME AS NEEDED FOR SLEEP.   trospium (SANCTURA) 20 MG tablet TAKE 1 TABLET BY MOUTH TWICE A DAY   Turmeric Curcumin 500 MG CAPS Take 500 mg by mouth daily.   valsartan-hydrochlorothiazide (DIOVAN-HCT) 320-25 MG tablet TAKE 1 TABLET BY MOUTH EVERY DAY   No facility-administered encounter medications on file as of 01/23/2023.    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, lateral back   Depression    Essential hypertension 10/29/2015   GERD (gastroesophageal reflux disease)    Hypertension    Insomnia    Migraine headache    Past Surgical History:  Procedure Laterality Date   ABDOMINAL HYSTERECTOMY     BACK SURGERY     BREAST SURGERY     biopsy x2; reduction   EXAMINATION UNDER ANESTHESIA  08/27/2012   LIPOMA EXCISION Right 07/03/2021   Procedure: EXCISION LIPOMA; ANKLE;  Surgeon: Lucretia Roers, MD;  Location: [facility];  Service: General;  Laterality: Right;   LIPOMA EXCISION N/A 07/03/2021   Procedure: EXCISION LIPOMA; SUPRAPUBIC;  Surgeon: Lucretia Roers, MD;  Location: [facility];  Service: General;   Laterality: N/A;   MASS EXCISION Left 07/03/2021   Procedure: EXCISION LIPOMA; ANKLE;  Surgeon: Lucretia Roers, MD;  Location: [facility];  Service: General;  Laterality: Left;   MASS EXCISION Left 07/03/2021   Procedure: EXCISION OF LIPOMA; ARM;  Surgeon: Lucretia Roers, MD;  Location: [facility];  Service: General;  Laterality: Left;   NECK SURGERY     Mass removed   REDUCTION MAMMAPLASTY Bilateral 2002   XI ROBOTIC ASSISTED HIATAL HERNIA REPAIR N/A 11/18/2022   Procedure: ROBOTIC HIATAL HERNIA REPAIR;  Surgeon: Quentin Ore, MD;  Location: [facility];  Service: General;  Laterality: N/A;   Family History  Problem Relation Age of Onset   Bladder Cancer Mother    Allergies Mother    Heart disease Mother    Allergies Sister    Alzheimer's disease Father    Breast cancer Neg Hx  Social History   Socioeconomic History   Marital status: Married    Spouse name: Not on file   Number of children: 2   Years of education: Not on file   Highest education level: Not on file  Occupational History   Occupation: Purchasing   Tobacco Use   Smoking status: Former    Packs/day: 0.25    Years: 2.00    Additional pack years: 0.00    Total pack years: 0.50    Types: Cigarettes    Quit date: 1980    Years since quitting: 44.5   Smokeless tobacco: Never  Vaping Use   Vaping Use: Never used  Substance and Sexual Activity   Alcohol use: No    Alcohol/week: 0.0 standard drinks of alcohol   Drug use: No   Sexual activity: Not on file  Other Topics Concern   Not on file  Social History Narrative   She is a retired Therapist, occupational.  She is married with 2 daughters and 4 grandchildren, all who live locally.  She tries to stay active.   Social Determinants of Health   Financial Resource Strain: Low Risk  (01/23/2023)   Overall Financial Resource Strain (CARDIA)    Difficulty of Paying Living Expenses: Not hard at all  Food Insecurity: No Food Insecurity (01/23/2023)    Hunger Vital Sign    Worried About Running Out of Food in the Last Year: Never true    Ran Out of Food in the Last Year: Never true  Transportation Needs: No Transportation Needs (11/19/2022)   PRAPARE - Administrator, Civil Service (Medical): No    Lack of Transportation (Non-Medical): No  Physical Activity: Insufficiently Active (01/23/2023)   Exercise Vital Sign    Days of Exercise per Week: 3 days    Minutes of Exercise per Session: 30 min  Stress: No Stress Concern Present (01/23/2023)   [employer] of Occupational Health - Occupational Stress Questionnaire    Feeling of Stress : Not at all  Social Connections: Socially Integrated (01/23/2023)   Social Connection and Isolation Panel [NHANES]    Frequency of Communication with Friends and Family: More than three times a week    Frequency of Social Gatherings with Friends and Family: More than three times a week    Attends Religious Services: More than 4 times per year    Active Member of [employer] or Organizations: Yes    Attends Engineer, structural: More than 4 times per year    Marital Status: Married    Tobacco Counseling Counseling given: Not Answered   Clinical Intake:  Pre-visit preparation completed: Yes  Pain : No/denies pain     Nutritional Risks: None Diabetes: No  How often do you need to have someone help you when you read instructions, pamphlets, or other written materials from your doctor or pharmacy?: 1 - Never  Interpreter Needed?: No  Information entered by :: Renie Ora, LPN   Activities of Daily Living    01/23/2023    9:56 AM 11/19/2022    3:00 AM  In your present state of health, do you have any difficulty performing the following activities:  Hearing? 0 0  Vision? 0 1  Difficulty concentrating or making decisions? 0 1  Walking or climbing stairs? 0 1  Dressing or bathing? 0 0  Doing errands, shopping? 0 0  Preparing Food and eating ? N   Using the Toilet? N   In  the past  six months, have you accidently leaked urine? N   Do you have problems with loss of bowel control? N   Managing your Medications? N   Managing your Finances? N   Housekeeping or managing your Housekeeping? N     Patient Care Team: Raliegh Ip, DO as PCP - General (Family Medicine) Janalyn Harder, MD (Inactive) as Consulting Physician (Dermatology) Eileen Stanford, MD as Referring Physician (Allergy and Immunology) Delora Fuel, OD (Optometry)  Indicate any recent Medical Services you may have received from other than [facility] providers in the past year (date may be approximate).     Assessment:   This is a routine wellness examination for Kristina Mclaughlin.  Hearing/Vision screen Vision Screening - Comments:: Wears rx glasses - up to date with routine eye exams with  Dr.Johnson   Dietary issues and exercise activities discussed:     Goals Addressed             This Visit's Progress    DIET - INCREASE WATER INTAKE         Depression Screen    01/23/2023    9:54 AM 09/23/2022    2:35 PM 08/29/2022   12:02 PM 07/18/2022    9:25 AM 06/24/2022    3:14 PM 05/19/2022   10:38 AM 05/06/2022    3:28 PM  PHQ 2/9 Scores  PHQ - 2 Score 0 0 0 0 0 0 0  PHQ- 9 Score  0 0 0 0 0 0    Fall Risk    01/23/2023    9:52 AM 01/23/2023    9:51 AM 08/29/2022   12:02 PM 07/18/2022    9:25 AM 06/24/2022    3:14 PM  Fall Risk   Falls in the past year? 1 0 1 1 0  Number falls in past yr: 1 0 1 1   Injury with Fall? 1 0 1 0   Risk for fall due to : History of fall(s);Impaired balance/gait;Orthopedic patient No Fall Risks Impaired balance/gait    Follow up Education provided;Falls prevention discussed;Falls evaluation completed Falls prevention discussed Falls evaluation completed Falls prevention discussed     MEDICARE RISK AT HOME:  Medicare Risk at Home - 01/23/23 0951     Any stairs in or around the home? No    If so, are there any without handrails? No    Home free of loose  throw rugs in walkways, pet beds, electrical cords, etc? Yes    Adequate lighting in your home to reduce risk of falls? Yes    Life alert? No    Use of a cane, walker or w/c? No    Grab bars in the bathroom? Yes    Shower chair or bench in shower? Yes    Elevated toilet seat or a handicapped toilet? Yes             TIMED UP AND GO:  Was the test performed?  No    Cognitive Function:        01/23/2023    9:56 AM 01/21/2022   11:21 AM 01/08/2021   11:10 AM 03/22/2019   11:31 AM  6CIT Screen  What Year? 0 points 0 points 0 points 0 points  What month? 0 points 0 points 0 points   What time? 0 points 0 points 0 points 0 points  Count back from 20 0 points 0 points 0 points 0 points  Months in reverse 0 points 0 points 0 points 0 points  Repeat phrase  0 points 4 points 0 points 0 points  Total Score 0 points 4 points 0 points     Immunizations Immunization History  Administered Date(s) Administered   Fluad Quad(high Dose 65+) 05/23/2019, 05/30/2020, 05/27/2021, 06/02/2022   Influenza, High Dose Seasonal PF 08/07/2016, 08/06/2017, 08/12/2018, 10/20/2019, 10/17/2021   Influenza,inj,quad, With Preservative 05/06/2017, 05/08/2018   Influenza-Unspecified 05/23/2015, 05/08/2018   PFIZER(Purple Top)SARS-COV-2 Vaccination 10/24/2019, 11/14/2019   Pneumococcal Conjugate-13 05/23/2019   Pneumococcal Polysaccharide-23 08/07/2016, 08/06/2017, 06/09/2018, 08/12/2018, 10/20/2019, 10/17/2021   Pneumococcal-Unspecified 05/23/2015   Zoster Recombinat (Shingrix) 09/05/2019, 11/28/2019   Zoster, Live 06/20/2015    TDAP status: Due, Education has been provided regarding the importance of this vaccine. Advised may receive this vaccine at local pharmacy or Health Dept. Aware to provide a copy of the vaccination record if obtained from local pharmacy or Health Dept. Verbalized acceptance and understanding.  Flu Vaccine status: Up to date  Pneumococcal vaccine status: Up to date  Covid-19  vaccine status: Completed vaccines  Qualifies for Shingles Vaccine? Yes   Zostavax completed Yes   Shingrix Completed?: Yes  Screening Tests Health Maintenance  Topic Date Due   DTaP/Tdap/Td (1 - Tdap) Never done   COVID-19 Vaccine (3 - Pfizer risk series) 12/12/2019   INFLUENZA VACCINE  03/05/2023   MAMMOGRAM  08/15/2023   Medicare Annual Wellness (AWV)  01/23/2024   Colonoscopy  06/26/2024   DEXA SCAN  04/03/2027   Pneumonia Vaccine 53+ Years old  Completed   Hepatitis C Screening  Completed   Zoster Vaccines- Shingrix  Completed   HPV VACCINES  Aged Out    Health Maintenance  Health Maintenance Due  Topic Date Due   DTaP/Tdap/Td (1 - Tdap) Never done   COVID-19 Vaccine (3 - Pfizer risk series) 12/12/2019    Colorectal cancer screening: Type of screening: Colonoscopy. Completed 06/26/2014. Repeat every 10 years  Mammogram status: Completed 08/14/2022. Repeat every year  Bone Density status: Completed 04/02/2022. Results reflect: Bone density results: OSTEOPENIA. Repeat every 5 years.  Lung Cancer Screening: (Low Dose CT Chest recommended if Age 69-80 years, 20 pack-year currently smoking OR have quit w/in 15years.) does not qualify.   Lung Cancer Screening Referral: n/a  Additional Screening:  Hepatitis C Screening: does not qualify; Completed 10/20/2018  Vision Screening: Recommended annual ophthalmology exams for early detection of glaucoma and other disorders of the eye. Is the patient up to date with their annual eye exam?  Yes  Who is the provider or what is the name of the office in which the patient attends annual eye exams? Dr.johnson If pt is not established with a provider, would they like to be referred to a provider to establish care? No .   Dental Screening: Recommended annual dental exams for proper oral hygiene  Diabetic Foot Exam: Diabetic Foot Exam: Overdue, Pt has been advised about the importance in completing this exam. Pt is scheduled for  diabetic foot exam on next office visit .  Community Resource Referral / Chronic Care Management: CRR required this visit?  No   CCM required this visit?  No     Plan:     I have personally reviewed and noted the following in the patient's chart:   Medical and social history Use of alcohol, tobacco or illicit drugs  Current medications and supplements including opioid prescriptions. Patient is not currently taking opioid prescriptions. Functional ability and status Nutritional status Physical activity Advanced directives List of other physicians Hospitalizations, surgeries, and ER visits in previous 12 months  Vitals Screenings to include cognitive, depression, and falls Referrals and appointments  In addition, I have reviewed and discussed with patient certain preventive protocols, quality metrics, and best practice recommendations. A written personalized care plan for preventive services as well as general preventive health recommendations were provided to patient.     Lorrene Reid, LPN   1/61/0960   After Visit Summary: (MyChart) Due to this being a telephonic visit, the after visit summary with patients personalized plan was offered to patient via MyChart   Nurse Notes: Due TDAP Vaccine

## 2023-01-23 NOTE — Patient Instructions (Signed)
Kristina Mclaughlin , Thank you for taking time to come for your Medicare Wellness Visit. I appreciate your ongoing commitment to your health goals. Please review the following plan we discussed and let me know if I can assist you in the future.   These are the goals we discussed:  Goals      DIET - INCREASE WATER INTAKE     Weight (lb) < 200 lb (90.7 kg)     Goal is to be below 200 lbs Hopes knees get better so she can stay active and independent        This is a list of the screening recommended for you and due dates:  Health Maintenance  Topic Date Due   DTaP/Tdap/Td vaccine (1 - Tdap) Never done   COVID-19 Vaccine (3 - Pfizer risk series) 12/12/2019   Flu Shot  03/05/2023   Mammogram  08/15/2023   Medicare Annual Wellness Visit  01/23/2024   Colon Cancer Screening  06/26/2024   DEXA scan (bone density measurement)  04/03/2027   Pneumonia Vaccine  Completed   Hepatitis C Screening  Completed   Zoster (Shingles) Vaccine  Completed   HPV Vaccine  Aged Out    Advanced directives: Please bring a copy of your health care power of attorney and living will to the office to be added to your chart at your convenience.   Conditions/risks identified: Aim for 30 minutes of exercise or brisk walking, 6-8 glasses of water, and 5 servings of fruits and vegetables each day.   Next appointment: Follow up in one year for your annual wellness visit    Preventive Care 65 Years and Older, Female Preventive care refers to lifestyle choices and visits with your health care provider that can promote health and wellness. What does preventive care include? A yearly physical exam. This is also called an annual well check. Dental exams once or twice a year. Routine eye exams. Ask your health care provider how often you should have your eyes checked. Personal lifestyle choices, including: Daily care of your teeth and gums. Regular physical activity. Eating a healthy diet. Avoiding tobacco and drug  use. Limiting alcohol use. Practicing safe sex. Taking low-dose aspirin every day. Taking vitamin and mineral supplements as recommended by your health care provider. What happens during an annual well check? The services and screenings done by your health care provider during your annual well check will depend on your age, overall health, lifestyle risk factors, and family history of disease. Counseling  Your health care provider may ask you questions about your: Alcohol use. Tobacco use. Drug use. Emotional well-being. Home and relationship well-being. Sexual activity. Eating habits. History of falls. Memory and ability to understand (cognition). Work and work Astronomer. Reproductive health. Screening  You may have the following tests or measurements: Height, weight, and BMI. Blood pressure. Lipid and cholesterol levels. These may be checked every 5 years, or more frequently if you are over 32 years old. Skin check. Lung cancer screening. You may have this screening every year starting at age 17 if you have a 30-pack-year history of smoking and currently smoke or have quit within the past 15 years. Fecal occult blood test (FOBT) of the stool. You may have this test every year starting at age 58. Flexible sigmoidoscopy or colonoscopy. You may have a sigmoidoscopy every 5 years or a colonoscopy every 10 years starting at age 3. Hepatitis C blood test. Hepatitis B blood test. Sexually transmitted disease (STD) testing. Diabetes screening. This  is done by checking your blood sugar (glucose) after you have not eaten for a while (fasting). You may have this done every 1-3 years. Bone density scan. This is done to screen for osteoporosis. You may have this done starting at age 54. Mammogram. This may be done every 1-2 years. Talk to your health care provider about how often you should have regular mammograms. Talk with your health care provider about your test results, treatment  options, and if necessary, the need for more tests. Vaccines  Your health care provider may recommend certain vaccines, such as: Influenza vaccine. This is recommended every year. Tetanus, diphtheria, and acellular pertussis (Tdap, Td) vaccine. You may need a Td booster every 10 years. Zoster vaccine. You may need this after age 42. Pneumococcal 13-valent conjugate (PCV13) vaccine. One dose is recommended after age 66. Pneumococcal polysaccharide (PPSV23) vaccine. One dose is recommended after age 47. Talk to your health care provider about which screenings and vaccines you need and how often you need them. This information is not intended to replace advice given to you by your health care provider. Make sure you discuss any questions you have with your health care provider. Document Released: 08/17/2015 Document Revised: 04/09/2016 Document Reviewed: 05/22/2015 Elsevier Interactive Patient Education  2017 [employer] Prevention in the Home Falls can cause injuries. They can happen to people of all ages. There are many things you can do to make your home safe and to help prevent falls. What can I do on the outside of my home? Regularly fix the edges of walkways and driveways and fix any cracks. Remove anything that might make you trip as you walk through a door, such as a raised step or threshold. Trim any bushes or trees on the path to your home. Use bright outdoor lighting. Clear any walking paths of anything that might make someone trip, such as rocks or tools. Regularly check to see if handrails are loose or broken. Make sure that both sides of any steps have handrails. Any raised decks and porches should have guardrails on the edges. Have any leaves, snow, or ice cleared regularly. Use sand or salt on walking paths during winter. Clean up any spills in your garage right away. This includes oil or grease spills. What can I do in the bathroom? Use night lights. Install grab  bars by the toilet and in the tub and shower. Do not use towel bars as grab bars. Use non-skid mats or decals in the tub or shower. If you need to sit down in the shower, use a plastic, non-slip stool. Keep the floor dry. Clean up any water that spills on the floor as soon as it happens. Remove soap buildup in the tub or shower regularly. Attach bath mats securely with double-sided non-slip rug tape. Do not have throw rugs and other things on the floor that can make you trip. What can I do in the bedroom? Use night lights. Make sure that you have a light by your bed that is easy to reach. Do not use any sheets or blankets that are too big for your bed. They should not hang down onto the floor. Have a firm chair that has side arms. You can use this for support while you get dressed. Do not have throw rugs and other things on the floor that can make you trip. What can I do in the kitchen? Clean up any spills right away. Avoid walking on wet floors. Keep items that  you use a lot in easy-to-reach places. If you need to reach something above you, use a strong step stool that has a grab bar. Keep electrical cords out of the way. Do not use floor polish or wax that makes floors slippery. If you must use wax, use non-skid floor wax. Do not have throw rugs and other things on the floor that can make you trip. What can I do with my stairs? Do not leave any items on the stairs. Make sure that there are handrails on both sides of the stairs and use them. Fix handrails that are broken or loose. Make sure that handrails are as long as the stairways. Check any carpeting to make sure that it is firmly attached to the stairs. Fix any carpet that is loose or worn. Avoid having throw rugs at the top or bottom of the stairs. If you do have throw rugs, attach them to the floor with carpet tape. Make sure that you have a light switch at the top of the stairs and the bottom of the stairs. If you do not have them,  ask someone to add them for you. What else can I do to help prevent falls? Wear shoes that: Do not have high heels. Have rubber bottoms. Are comfortable and fit you well. Are closed at the toe. Do not wear sandals. If you use a stepladder: Make sure that it is fully opened. Do not climb a closed stepladder. Make sure that both sides of the stepladder are locked into place. Ask someone to hold it for you, if possible. Clearly mark and make sure that you can see: Any grab bars or handrails. First and last steps. Where the edge of each step is. Use tools that help you move around (mobility aids) if they are needed. These include: Canes. Walkers. Scooters. Crutches. Turn on the lights when you go into a dark area. Replace any light bulbs as soon as they burn out. Set up your furniture so you have a clear path. Avoid moving your furniture around. If any of your floors are uneven, fix them. If there are any pets around you, be aware of where they are. Review your medicines with your doctor. Some medicines can make you feel dizzy. This can increase your chance of falling. Ask your doctor what other things that you can do to help prevent falls. This information is not intended to replace advice given to you by your health care provider. Make sure you discuss any questions you have with your health care provider. Document Released: 05/17/2009 Document Revised: 12/27/2015 Document Reviewed: 08/25/2014 Elsevier Interactive Patient Education  2017 [employer].

## 2023-02-10 DIAGNOSIS — J3081 Allergic rhinitis due to animal (cat) (dog) hair and dander: Secondary | ICD-10-CM | POA: Diagnosis not present

## 2023-02-10 DIAGNOSIS — J3089 Other allergic rhinitis: Secondary | ICD-10-CM | POA: Diagnosis not present

## 2023-02-10 DIAGNOSIS — J301 Allergic rhinitis due to pollen: Secondary | ICD-10-CM | POA: Diagnosis not present

## 2023-02-27 DIAGNOSIS — M2012 Hallux valgus (acquired), left foot: Secondary | ICD-10-CM | POA: Diagnosis not present

## 2023-03-08 ENCOUNTER — Other Ambulatory Visit: Payer: Self-pay | Admitting: Family Medicine

## 2023-03-08 ENCOUNTER — Other Ambulatory Visit: Payer: Self-pay | Admitting: Family

## 2023-03-08 DIAGNOSIS — N3281 Overactive bladder: Secondary | ICD-10-CM

## 2023-03-08 DIAGNOSIS — M7989 Other specified soft tissue disorders: Secondary | ICD-10-CM

## 2023-03-08 DIAGNOSIS — E781 Pure hyperglyceridemia: Secondary | ICD-10-CM

## 2023-03-08 DIAGNOSIS — R350 Frequency of micturition: Secondary | ICD-10-CM

## 2023-03-08 DIAGNOSIS — F5101 Primary insomnia: Secondary | ICD-10-CM

## 2023-03-08 DIAGNOSIS — S99922A Unspecified injury of left foot, initial encounter: Secondary | ICD-10-CM

## 2023-03-10 DIAGNOSIS — G5601 Carpal tunnel syndrome, right upper limb: Secondary | ICD-10-CM | POA: Diagnosis not present

## 2023-03-11 ENCOUNTER — Other Ambulatory Visit: Payer: Self-pay | Admitting: Family

## 2023-03-11 ENCOUNTER — Other Ambulatory Visit: Payer: Self-pay | Admitting: Family Medicine

## 2023-03-11 DIAGNOSIS — M7989 Other specified soft tissue disorders: Secondary | ICD-10-CM

## 2023-03-11 DIAGNOSIS — F5101 Primary insomnia: Secondary | ICD-10-CM

## 2023-03-11 DIAGNOSIS — N3281 Overactive bladder: Secondary | ICD-10-CM

## 2023-03-11 DIAGNOSIS — E781 Pure hyperglyceridemia: Secondary | ICD-10-CM

## 2023-03-11 DIAGNOSIS — R350 Frequency of micturition: Secondary | ICD-10-CM

## 2023-03-11 DIAGNOSIS — S99922A Unspecified injury of left foot, initial encounter: Secondary | ICD-10-CM

## 2023-03-19 DIAGNOSIS — J3089 Other allergic rhinitis: Secondary | ICD-10-CM | POA: Diagnosis not present

## 2023-03-19 DIAGNOSIS — J301 Allergic rhinitis due to pollen: Secondary | ICD-10-CM | POA: Diagnosis not present

## 2023-03-19 DIAGNOSIS — J3081 Allergic rhinitis due to animal (cat) (dog) hair and dander: Secondary | ICD-10-CM | POA: Diagnosis not present

## 2023-03-23 ENCOUNTER — Other Ambulatory Visit: Payer: Self-pay | Admitting: Family Medicine

## 2023-03-23 DIAGNOSIS — N3281 Overactive bladder: Secondary | ICD-10-CM

## 2023-03-23 DIAGNOSIS — R350 Frequency of micturition: Secondary | ICD-10-CM

## 2023-03-23 DIAGNOSIS — F329 Major depressive disorder, single episode, unspecified: Secondary | ICD-10-CM

## 2023-03-23 NOTE — Telephone Encounter (Signed)
Appt scheduled for 04/13/23 for CPE with Dr. Reece Agar.

## 2023-03-23 NOTE — Telephone Encounter (Signed)
[facility]ottschalk  pt NTBS 30-d given 03/11/23

## 2023-03-26 DIAGNOSIS — J301 Allergic rhinitis due to pollen: Secondary | ICD-10-CM | POA: Diagnosis not present

## 2023-03-26 DIAGNOSIS — J3081 Allergic rhinitis due to animal (cat) (dog) hair and dander: Secondary | ICD-10-CM | POA: Diagnosis not present

## 2023-03-26 DIAGNOSIS — J3089 Other allergic rhinitis: Secondary | ICD-10-CM | POA: Diagnosis not present

## 2023-04-04 ENCOUNTER — Other Ambulatory Visit: Payer: Self-pay | Admitting: Family Medicine

## 2023-04-04 DIAGNOSIS — E781 Pure hyperglyceridemia: Secondary | ICD-10-CM

## 2023-04-08 ENCOUNTER — Other Ambulatory Visit: Payer: Self-pay | Admitting: Family Medicine

## 2023-04-08 DIAGNOSIS — E781 Pure hyperglyceridemia: Secondary | ICD-10-CM

## 2023-04-13 ENCOUNTER — Encounter: Payer: Self-pay | Admitting: Family Medicine

## 2023-04-13 ENCOUNTER — Ambulatory Visit (INDEPENDENT_AMBULATORY_CARE_PROVIDER_SITE_OTHER): Payer: Medicare HMO | Admitting: Family Medicine

## 2023-04-13 VITALS — BP 140/80 | HR 67 | Temp 98.7°F | Ht 69.0 in | Wt 236.0 lb

## 2023-04-13 DIAGNOSIS — F5101 Primary insomnia: Secondary | ICD-10-CM

## 2023-04-13 DIAGNOSIS — R1031 Right lower quadrant pain: Secondary | ICD-10-CM | POA: Diagnosis not present

## 2023-04-13 DIAGNOSIS — R7303 Prediabetes: Secondary | ICD-10-CM | POA: Diagnosis not present

## 2023-04-13 DIAGNOSIS — E781 Pure hyperglyceridemia: Secondary | ICD-10-CM | POA: Diagnosis not present

## 2023-04-13 DIAGNOSIS — F329 Major depressive disorder, single episode, unspecified: Secondary | ICD-10-CM | POA: Diagnosis not present

## 2023-04-13 DIAGNOSIS — I10 Essential (primary) hypertension: Secondary | ICD-10-CM

## 2023-04-13 DIAGNOSIS — K5909 Other constipation: Secondary | ICD-10-CM | POA: Diagnosis not present

## 2023-04-13 DIAGNOSIS — Z0001 Encounter for general adult medical examination with abnormal findings: Secondary | ICD-10-CM | POA: Diagnosis not present

## 2023-04-13 DIAGNOSIS — N3281 Overactive bladder: Secondary | ICD-10-CM | POA: Diagnosis not present

## 2023-04-13 DIAGNOSIS — Z0000 Encounter for general adult medical examination without abnormal findings: Secondary | ICD-10-CM

## 2023-04-13 LAB — BAYER DCA HB A1C WAIVED: HB A1C (BAYER DCA - WAIVED): 6.2 % — ABNORMAL HIGH (ref 4.8–5.6)

## 2023-04-13 MED ORDER — VALSARTAN-HYDROCHLOROTHIAZIDE 320-25 MG PO TABS
1.0000 | ORAL_TABLET | Freq: Every day | ORAL | 3 refills | Status: DC
Start: 2023-04-13 — End: 2024-05-03

## 2023-04-13 MED ORDER — TROSPIUM CHLORIDE 20 MG PO TABS
20.0000 mg | ORAL_TABLET | Freq: Two times a day (BID) | ORAL | 3 refills | Status: DC
Start: 2023-04-13 — End: 2024-03-07

## 2023-04-13 MED ORDER — AMLODIPINE BESYLATE 5 MG PO TABS: 5.0000 mg | ORAL_TABLET | Freq: Every day | ORAL | 3 refills | Status: DC

## 2023-04-13 MED ORDER — SUMATRIPTAN SUCCINATE 100 MG PO TABS: ORAL_TABLET | ORAL | 99 refills | Status: AC

## 2023-04-13 MED ORDER — BUPROPION HCL ER (SR) 100 MG PO TB12
ORAL_TABLET | ORAL | 0 refills | Status: DC
Start: 2023-04-13 — End: 2023-11-18

## 2023-04-13 MED ORDER — TRAZODONE HCL 150 MG PO TABS
150.0000 mg | ORAL_TABLET | Freq: Every evening | ORAL | 3 refills | Status: DC | PRN
Start: 2023-04-13 — End: 2024-03-07

## 2023-04-13 MED ORDER — LINACLOTIDE 290 MCG PO CAPS
290.0000 ug | ORAL_CAPSULE | Freq: Every day | ORAL | Status: DC
Start: 2023-04-13 — End: 2024-05-03

## 2023-04-13 MED ORDER — ATORVASTATIN CALCIUM 40 MG PO TABS: 40.0000 mg | ORAL_TABLET | Freq: Every day | ORAL | 3 refills | Status: DC

## 2023-04-13 NOTE — Progress Notes (Signed)
Kristina Mclaughlin is a 72 y.o. female presents to office today for annual physical exam examination.    Concerns today include: 1.  Right lower quadrant pain Patient reports intermittent right sided lower quadrant pain that becomes severe that she doubles over.  This occurs at random, sometimes once or twice per week but sometimes it does not occur at all.  She does report chronic constipation is currently treated with Linzess 145 mcg by her gastroenterologist.  Had Nissen fundoplication done earlier this year and does report quite a bit of improvement with GERD symptoms.  She has follow-up with them later this year.  No reports of rectal bleeding, nausea, vomiting.  Does not feel that she empties her bowels totally  2.  Tremor She reports a mild tremor in her hands that she noticed today.  She wonders if perhaps her Wellbutrin may be causing.  She is been on this for reactive depression but feels like she may be of to come off of med.  Would like taper instructions  Occupation: Now working as Network engineer at [employer], Substance use: None Health Maintenance Due  Topic Date Due   DTaP/Tdap/Td (1 - Tdap) Never done   COVID-19 Vaccine (3 - Pfizer risk series) 12/12/2019   Refills needed today: All  Immunization History  Administered Date(s) Administered   Fluad Quad(high Dose 65+) 05/23/2019, 05/30/2020, 05/27/2021, 06/02/2022   Influenza, High Dose Seasonal PF 08/07/2016, 08/06/2017, 08/12/2018, 10/20/2019, 10/17/2021   Influenza,inj,quad, With Preservative 05/06/2017, 05/08/2018   Influenza-Unspecified 05/23/2015, 05/08/2018   PFIZER(Purple Top)SARS-COV-2 Vaccination 10/24/2019, 11/14/2019   Pneumococcal Conjugate-13 05/23/2019   Pneumococcal Polysaccharide-23 08/07/2016, 08/06/2017, 06/09/2018, 08/12/2018, 10/20/2019, 10/17/2021   Pneumococcal-Unspecified 05/23/2015   Zoster Recombinant(Shingrix) 09/05/2019, 11/28/2019   Zoster, Live 06/20/2015   Past Medical History:   Diagnosis Date   Arthritis    Asthma    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 - left lower, lateral back   Depression    Essential hypertension 10/29/2015   GERD (gastroesophageal reflux disease)    Hypertension    Insomnia    Migraine headache    Tear of medial meniscus of knee 09/06/2018   Social History   Socioeconomic History   Marital status: Married    Spouse name: Not on file   Number of children: 2   Years of education: Not on file   Highest education level: Not on file  Occupational History   Occupation: Purchasing   Tobacco Use   Smoking status: Former    Current packs/day: 0.00    Average packs/day: 0.3 packs/day for 2.0 years (0.5 ttl pk-yrs)    Types: Cigarettes    Start date: 54    Quit date: 1980    Years since quitting: 44.7   Smokeless tobacco: Never  Vaping Use   Vaping status: Never Used  Substance and Sexual Activity   Alcohol use: No    Alcohol/week: 0.0 standard drinks of alcohol   Drug use: No   Sexual activity: Not on file  Other Topics Concern   Not on file  Social History Narrative   She is a retired Therapist, occupational.  She is married with 2 daughters and 4 grandchildren, all who live locally.  She tries to stay active.   Social Determinants of Health   Financial Resource Strain: Low Risk  (01/23/2023)   Overall Financial Resource Strain (CARDIA)    Difficulty of Paying Living Expenses: Not hard at all  Food Insecurity: No Food Insecurity (01/23/2023)  Hunger Vital Sign    Worried About Running Out of Food in the Last Year: Never true    Ran Out of Food in the Last Year: Never true  Transportation Needs: No Transportation Needs (11/19/2022)   PRAPARE - Administrator, Civil Service (Medical): No    Lack of Transportation (Non-Medical): No  Physical Activity: Insufficiently Active (01/23/2023)   Exercise Vital Sign    Days of Exercise per Week: 3 days    Minutes of Exercise per Session: 30 min   Stress: No Stress Concern Present (01/23/2023)   [employer] of Occupational Health - Occupational Stress Questionnaire    Feeling of Stress : Not at all  Social Connections: Socially Integrated (01/23/2023)   Social Connection and Isolation Panel [NHANES]    Frequency of Communication with Friends and Family: More than three times a week    Frequency of Social Gatherings with Friends and Family: More than three times a week    Attends Religious Services: More than 4 times per year    Active Member of Clubs or Organizations: Yes    Attends Banker Meetings: More than 4 times per year    Marital Status: Married  Catering manager Violence: Not At Risk (01/23/2023)   Humiliation, Afraid, Rape, and Kick questionnaire    Fear of Current or Ex-Partner: No    Emotionally Abused: No    Physically Abused: No    Sexually Abused: No   Past Surgical History:  Procedure Laterality Date   ABDOMINAL HYSTERECTOMY     BACK SURGERY     BREAST SURGERY     biopsy x2; reduction   EXAMINATION UNDER ANESTHESIA  08/27/2012   LIPOMA EXCISION Right 07/03/2021   Procedure: EXCISION LIPOMA; ANKLE;  Surgeon: Lucretia Roers, MD;  Location: [facility];  Service: General;  Laterality: Right;   LIPOMA EXCISION N/A 07/03/2021   Procedure: EXCISION LIPOMA; SUPRAPUBIC;  Surgeon: Lucretia Roers, MD;  Location: [facility];  Service: General;  Laterality: N/A;   MASS EXCISION Left 07/03/2021   Procedure: EXCISION LIPOMA; ANKLE;  Surgeon: Lucretia Roers, MD;  Location: [facility];  Service: General;  Laterality: Left;   MASS EXCISION Left 07/03/2021   Procedure: EXCISION OF LIPOMA; ARM;  Surgeon: Lucretia Roers, MD;  Location: [facility];  Service: General;  Laterality: Left;   NECK SURGERY     Mass removed   REDUCTION MAMMAPLASTY Bilateral 2002   XI ROBOTIC ASSISTED HIATAL HERNIA REPAIR N/A 11/18/2022   Procedure: ROBOTIC HIATAL HERNIA REPAIR;  Surgeon: Quentin Ore, MD;  Location: [facility];   Service: General;  Laterality: N/A;   Family History  Problem Relation Age of Onset   Bladder Cancer Mother    Allergies Mother    Heart disease Mother    Allergies Sister    Alzheimer's disease Father    Breast cancer Neg Hx     Current Outpatient Medications:    acetaminophen (TYLENOL) 500 MG tablet, Take 1,000 mg by mouth every 6 (six) hours as needed for moderate pain., Disp: , Rfl:    albuterol (VENTOLIN HFA) 108 (90 Base) MCG/ACT inhaler, TAKE 2 PUFFS BY MOUTH EVERY 6 HOURS AS NEEDED FOR WHEEZE OR SHORTNESS OF BREATH, Disp: 8.5 each, Rfl: 5   amLODipine (NORVASC) 5 MG tablet, TAKE 1 TABLET (5 MG TOTAL) BY MOUTH DAILY., Disp: 90 tablet, Rfl: 1   atorvastatin (LIPITOR) 40 MG tablet, Take 1 tablet (40 mg total) by mouth daily., Disp: 30  tablet, Rfl: 0   Azelastine HCl 137 MCG/SPRAY SOLN, Place 1 spray into both nostrils daily as needed (congestion)., Disp: , Rfl:    Biotin 10 MG CAPS, Take 10 mg by mouth daily., Disp: , Rfl:    budesonide-formoterol (SYMBICORT) 80-4.5 MCG/ACT inhaler, Take 2 puffs first thing in am and then another 2 puffs about 12 hours later., Disp: 1 each, Rfl: 12   buPROPion (WELLBUTRIN SR) 150 MG 12 hr tablet, Take 1 tablet (150 mg total) by mouth daily., Disp: 90 tablet, Rfl: 3   Calcium Carbonate (CALCIUM 500 PO), Take 500 mg by mouth daily., Disp: , Rfl:    Cholecalciferol (VITAMIN D3) 125 MCG (5000 UT) capsule, Take 5,000 Units by mouth daily., Disp: , Rfl:    COLLAGEN PO, Take 1 tablet by mouth daily., Disp: , Rfl:    diclofenac Sodium (VOLTAREN) 1 % GEL, Apply 2 g topically 4 (four) times daily as needed (arthritis)., Disp: 400 g, Rfl: PRN   EPINEPHrine 0.3 mg/0.3 mL IJ SOAJ injection, Inject 0.3 mg into the muscle as needed for anaphylaxis., Disp: , Rfl:    fexofenadine (ALLEGRA) 180 MG tablet, Take 1 tablet (180 mg total) by mouth daily. (Patient taking differently: Take 180 mg by mouth at bedtime.), Disp: 90 tablet, Rfl: 3   fluticasone (FLONASE) 50  MCG/ACT nasal spray, SPRAY 2 SPRAYS INTO EACH NOSTRIL EVERY DAY (Patient taking differently: Place 1 spray into both nostrils 2 (two) times daily.), Disp: 48 mL, Rfl: 1   gabapentin (NEURONTIN) 300 MG capsule, Take 1 capsule (300 mg total) by mouth 4 (four) times daily. (Patient taking differently: Take 300 mg by mouth 2 (two) times daily.), Disp: 120 capsule, Rfl: 11   ketoconazole (NIZORAL) 2 % shampoo, APPLY 1 APPLICATION TOPICALLY 2 (TWO) TIMES A WEEK. (Patient taking differently: Apply 1 Application topically daily as needed for irritation.), Disp: 240 mL, Rfl: 0   linaclotide (LINZESS) 72 MCG capsule, Take 72 mcg by mouth every other day., Disp: , Rfl:    Melatonin 5 MG CAPS, Take 5 mg by mouth at bedtime as needed (sleep)., Disp: , Rfl:    Multiple Vitamin (MULTI-VITAMIN PO), Take 1 tablet by mouth daily. , Disp: , Rfl:    Polyethylene Glycol 400 (BLINK TEARS OP), Place 1 drop into both eyes daily., Disp: , Rfl:    Probiotic Product (PROBIOTIC PO), Take 1 capsule by mouth daily., Disp: , Rfl:    sucralfate (CARAFATE) 1 g tablet, Take 1 tablet (1 g total) by mouth 4 (four) times daily -  before meals and at bedtime. (Patient taking differently: Take 1 g by mouth 4 (four) times daily as needed (heartburn).), Disp: 360 tablet, Rfl: 2   SUMAtriptan (IMITREX) 100 MG tablet, TAKE 1 TABLET AS NEED FOR MIGRAINE. MAY REPEAT IN 2 HOURS IF HEADACHE PERSISTS OR RECURS., Disp: 8 tablet, Rfl: prn   traZODone (DESYREL) 150 MG tablet, TAKE 1 TABLET (150 MG TOTAL) BY MOUTH AT BEDTIME AS NEEDED FOR SLEEP., Disp: 90 tablet, Rfl: 3   trospium (SANCTURA) 20 MG tablet, Take 1 tablet (20 mg total) by mouth 2 (two) times daily. **NEEDS TO BE SEEN BEFORE NEXT REFILL**, Disp: 60 tablet, Rfl: 0   Turmeric Curcumin 500 MG CAPS, Take 500 mg by mouth daily., Disp: , Rfl:    valsartan-hydrochlorothiazide (DIOVAN-HCT) 320-25 MG tablet, TAKE 1 TABLET BY MOUTH EVERY DAY, Disp: 90 tablet, Rfl: 1  Allergies  Allergen Reactions    Lovenox [Enoxaparin Sodium] Anaphylaxis   Moxifloxacin Anaphylaxis  and Other (See Comments)   Quinolones Hives and Shortness Of Breath   Mucinex Sinus-Max [Phenylephrine-Apap-Guaifenesin] Swelling    "Red mucinex tablet"    Sulfamethoxazole-Trimethoprim Other (See Comments)    Unknown reaction     ROS: Review of Systems Pertinent items noted in HPI and remainder of comprehensive ROS otherwise negative.    Physical exam BP (!) 140/80   Pulse 67   Temp 98.7 F (37.1 C)   Ht 5\' 9"  (1.753 m)   Wt 236 lb (107 kg)   SpO2 93%   BMI 34.85 kg/m  General appearance: alert, cooperative, appears stated age, and no distress Head: Normocephalic, without obvious abnormality, atraumatic Eyes: negative findings: lids and lashes normal, conjunctivae and sclerae normal, corneas clear, and pupils equal, round, reactive to light and accomodation Ears: normal TM's and external ear canals both ears Nose: Nares normal. Septum midline. Mucosa normal. No drainage or sinus tenderness. Throat: lips, mucosa, and tongue normal; teeth and gums normal Neck: no adenopathy, supple, symmetrical, trachea midline, and thyroid not enlarged, symmetric, no tenderness/mass/nodules Back: symmetric, no curvature. ROM normal. No CVA tenderness. Lungs: clear to auscultation bilaterally Heart:  Regular rate and rhythm.  S1, S2 heard.  2 out of 6 systolic ejection murmur noted at the right sternal border Abdomen: soft, non-tender; bowel sounds normal; no masses,  no organomegaly Extremities: extremities normal, atraumatic, no cyanosis or edema Pulses: 2+ and symmetric Skin: Skin color, texture, turgor normal. No rashes or lesions Lymph nodes: Cervical, supraclavicular, and axillary nodes normal. Neurologic: Grossly normal      04/13/2023    8:26 AM 04/13/2023    8:25 AM 01/23/2023    9:54 AM  Depression screen PHQ 2/9  Decreased Interest 0 0 0  Down, Depressed, Hopeless 0 0 0  PHQ - 2 Score 0 0 0  Altered sleeping 0  0   Tired, decreased energy 0 0   Change in appetite 0 0   Feeling bad or failure about yourself  0 0   Trouble concentrating 0 0   Moving slowly or fidgety/restless 0 0   Suicidal thoughts 0 0   PHQ-9 Score 0 0   Difficult doing work/chores Not difficult at all Not difficult at all       04/13/2023    8:25 AM 09/23/2022    2:35 PM 08/29/2022   12:02 PM 07/18/2022    9:25 AM  GAD 7 : Generalized Anxiety Score  Nervous, Anxious, on Edge 0 0 0 2  Control/stop worrying 0 0 0 0  Worry too much - different things 0 0 0 0  Trouble relaxing 0 0 0 0  Restless 0 0 0 0  Easily annoyed or irritable 0 0 0 0  Afraid - awful might happen 0 0 0 0  Total GAD 7 Score 0 0 0 2  Anxiety Difficulty  Not difficult at all Not difficult at all Not difficult at all     Assessment/ Plan: Kristina Mclaughlin here for annual physical exam.   Annual physical exam  RLQ abdominal pain - Plan: linaclotide (LINZESS) 290 MCG CAPS capsule  Chronic constipation - Plan: linaclotide (LINZESS) 290 MCG CAPS capsule  Essential hypertension - Plan: amLODipine (NORVASC) 5 MG tablet, valsartan-hydrochlorothiazide (DIOVAN-HCT) 320-25 MG tablet  Pre-diabetes - Plan: Bayer DCA Hb A1c Waived  Hypertriglyceridemia - Plan: Lipid Panel, atorvastatin (LIPITOR) 40 MG tablet  Reactive depression - Plan: buPROPion ER (WELLBUTRIN SR) 100 MG 12 hr tablet  Primary insomnia - Plan: traZODone (  DESYREL) 150 MG tablet  Overactive bladder - Plan: trospium (SANCTURA) 20 MG tablet  I suspect that the right lower quadrant abdominal pain that is intermittent is likely due to uncontrolled constipation and bloating.  I have given her samples of Linzess 290 mcg to trial.  Advised to follow-up with gastroenterology if symptoms are ongoing for further evaluation.  Her physical exam was unremarkable today  Blood pressure is borderline but technically under control for age with current regimen.  She is going to follow-up in 2 weeks for  blood pressure recheck again  Fasting labs collected.  Continue statin.  Check A1c given prediabetic range  She wishes to trial off of Wellbutrin so reduced dose sent with taper instructions.  I suspect that she may have a mild essential tremor but wants to see if perhaps medication is causing.  She will contact me to let me know how this is going  Trazodone renewed and sent to her renewed.  These issues are chronic and stable  Counseled on healthy lifestyle choices, including diet (rich in fruits, vegetables and lean meats and low in salt and simple carbohydrates) and exercise (at least 30 minutes of moderate physical activity daily).  Patient to follow up 6-68m  Kristina Mclaughlin M. [name], DO

## 2023-04-14 ENCOUNTER — Emergency Department (HOSPITAL_BASED_OUTPATIENT_CLINIC_OR_DEPARTMENT_OTHER)
Admission: EM | Admit: 2023-04-14 | Discharge: 2023-04-15 | Disposition: A | Discharge status: Home / Self Care | Payer: Medicare HMO | Attending: Emergency Medicine | Admitting: Emergency Medicine

## 2023-04-14 ENCOUNTER — Emergency Department (HOSPITAL_BASED_OUTPATIENT_CLINIC_OR_DEPARTMENT_OTHER): Payer: Medicare HMO

## 2023-04-14 ENCOUNTER — Other Ambulatory Visit: Payer: Self-pay

## 2023-04-14 ENCOUNTER — Telehealth: Payer: Self-pay | Admitting: Family Medicine

## 2023-04-14 DIAGNOSIS — R1031 Right lower quadrant pain: Secondary | ICD-10-CM | POA: Diagnosis not present

## 2023-04-14 DIAGNOSIS — R1030 Lower abdominal pain, unspecified: Secondary | ICD-10-CM

## 2023-04-14 DIAGNOSIS — Z79899 Other long term (current) drug therapy: Secondary | ICD-10-CM | POA: Insufficient documentation

## 2023-04-14 DIAGNOSIS — K5730 Diverticulosis of large intestine without perforation or abscess without bleeding: Secondary | ICD-10-CM | POA: Diagnosis not present

## 2023-04-14 DIAGNOSIS — R1033 Periumbilical pain: Secondary | ICD-10-CM | POA: Insufficient documentation

## 2023-04-14 DIAGNOSIS — I10 Essential (primary) hypertension: Secondary | ICD-10-CM | POA: Diagnosis not present

## 2023-04-14 DIAGNOSIS — K838 Other specified diseases of biliary tract: Secondary | ICD-10-CM | POA: Diagnosis not present

## 2023-04-14 DIAGNOSIS — J45909 Unspecified asthma, uncomplicated: Secondary | ICD-10-CM | POA: Diagnosis not present

## 2023-04-14 LAB — COMPREHENSIVE METABOLIC PANEL
ALT: 27 U/L (ref 0–44)
AST: 19 U/L (ref 15–41)
Albumin: 4.5 g/dL (ref 3.5–5.0)
Alkaline Phosphatase: 98 U/L (ref 38–126)
Anion gap: 10 (ref 5–15)
BUN: 14 mg/dL (ref 8–23)
CO2: 27 mmol/L (ref 22–32)
Calcium: 9.2 mg/dL (ref 8.9–10.3)
Chloride: 101 mmol/L (ref 98–111)
Creatinine, Ser: 0.89 mg/dL (ref 0.44–1.00)
GFR, Estimated: >60 mL/min (ref >60)
Glucose, Bld: 137 mg/dL — ABNORMAL HIGH (ref 70–99)
Potassium: 3.8 mmol/L (ref 3.5–5.1)
Sodium: 138 mmol/L (ref 135–145)
Total Bilirubin: 0.4 mg/dL (ref 0.3–1.2)
Total Protein: 7.3 g/dL (ref 6.5–8.1)

## 2023-04-14 LAB — CBC
HCT: 42.5 % (ref 36.0–46.0)
Hemoglobin: 14.6 g/dL (ref 12.0–15.0)
MCH: 29 pg (ref 26.0–34.0)
MCHC: 34.4 g/dL (ref 30.0–36.0)
MCV: 84.5 fL (ref 80.0–100.0)
Platelets: 219 10*3/uL (ref 150–400)
RBC: 5.03 MIL/uL (ref 3.87–5.11)
RDW: 13.8 % (ref 11.5–15.5)
WBC: 6.5 10*3/uL (ref 4.0–10.5)
nRBC: 0 % (ref 0.0–0.2)

## 2023-04-14 LAB — URINALYSIS, ROUTINE W REFLEX MICROSCOPIC
Bacteria, UA: NONE SEEN
Bilirubin Urine: NEGATIVE
Glucose, UA: NEGATIVE mg/dL
Hgb urine dipstick: NEGATIVE
Ketones, ur: NEGATIVE mg/dL
Nitrite: NEGATIVE
Protein, ur: NEGATIVE mg/dL
Specific Gravity, Urine: 1.014 (ref 1.005–1.030)
pH: 6.5 (ref 5.0–8.0)

## 2023-04-14 LAB — LIPID PANEL
Chol/HDL Ratio: 2.5 ratio (ref 0.0–4.4)
Cholesterol, Total: 114 mg/dL (ref 100–199)
HDL: 45 mg/dL (ref >39)
LDL Chol Calc (NIH): 49 mg/dL (ref 0–99)
Triglycerides: 111 mg/dL (ref 0–149)
VLDL Cholesterol Cal: 20 mg/dL (ref 5–40)

## 2023-04-14 LAB — LIPASE, BLOOD: Lipase: 12 U/L (ref 11–51)

## 2023-04-14 MED ORDER — IOHEXOL 300 MG/ML  SOLN
100.0000 mL | Freq: Once | INTRAMUSCULAR | Status: AC | PRN
  Administered 2023-04-14: 100 mL via INTRAVENOUS

## 2023-04-14 NOTE — Telephone Encounter (Signed)
Pt stated that feet are more swollen in afternoons and less swollen in the morning. 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 Denies and shortness of breath or feet being cold. Informed pt to elevate feet in the afternoons and to try compression socks and to make sure she is wearing good shoes while working. Advised pt to call back if these things do not help or if swelling gets worse because she will need to be seen. Advised pt if she develops any shortness of breath to go to ER. Pt verbalized understanding

## 2023-04-14 NOTE — ED Notes (Signed)
Patient transported to CT 

## 2023-04-14 NOTE — ED Triage Notes (Signed)
Right side abdo pain, started 3:30 pm Constant constant stabbing sharp pain

## 2023-04-14 NOTE — ED Provider Notes (Signed)
[facility] EMERGENCY DEPARTMENT AT [facility] Provider Note   CSN: [id number] Arrival date & time: 04/14/23  1731     History {Add pertinent medical, surgical, social history, OB history to HPI:1} Chief Complaint  Patient presents with  . Abdominal Pain    Kristina Mclaughlin is a 72 y.o. female with a past medical history of asthma, hypertension, migraines presents today for evaluation of abdominal pain.  Patient reports she has had abdominal pain since 3:30 PM today.  Pain is located in the periumbilical area and right lower quadrant, constant, nonradiating.  Patient states she has had similar pain in the past but it resolves quickly.  This time the pain is persistent.  Last bowel movement was earlier this afternoon with loose stool, nonbloody.  She denies any fever, nausea or vomiting, chest pain or shortness of breath, urinary symptoms, blood in her stool or urine.   Abdominal Pain   Past Medical History:  Diagnosis Date  . Arthritis   . Asthma   . 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 Depression   . Essential hypertension 10/29/2015  . GERD (gastroesophageal reflux disease)   . Hypertension   . Insomnia   . Migraine headache   . Tear of medial meniscus of knee 09/06/2018   Past Surgical History:  Procedure Laterality Date  . ABDOMINAL HYSTERECTOMY    . BACK SURGERY    . BREAST SURGERY     biopsy x2; reduction  . EXAMINATION UNDER ANESTHESIA  08/27/2012  . LIPOMA EXCISION Right 07/03/2021   Procedure: EXCISION LIPOMA; ANKLE;  Surgeon: Lucretia Roers, MD;  Location: [facility];  Service: General;  Laterality: Right;  . LIPOMA EXCISION N/A 07/03/2021   Procedure: EXCISION LIPOMA; SUPRAPUBIC;  Surgeon: Lucretia Roers, MD;  Location: [facility];  Service: General;  Laterality: N/A;  . MASS EXCISION Left 07/03/2021   Procedure: EXCISION LIPOMA; ANKLE;  Surgeon: Lucretia Roers, MD;  Location: [facility];  Service:  General;  Laterality: Left;  Marland Kitchen MASS EXCISION Left 07/03/2021   Procedure: EXCISION OF LIPOMA; ARM;  Surgeon: Lucretia Roers, MD;  Location: [facility];  Service: General;  Laterality: Left;  . NECK SURGERY     Mass removed  . REDUCTION MAMMAPLASTY Bilateral 2002  . XI ROBOTIC ASSISTED HIATAL HERNIA REPAIR N/A 11/18/2022   Procedure: ROBOTIC HIATAL HERNIA REPAIR;  Surgeon: Quentin Ore, MD;  Location: [facility];  Service: General;  Laterality: N/A;     Home Medications Prior to Admission medications   Medication Sig Start Date End Date Taking? Authorizing Provider  acetaminophen (TYLENOL) 500 MG tablet Take 1,000 mg by mouth every 6 (six) hours as needed for moderate pain.    [provider]  albuterol (VENTOLIN HFA) 108 (90 Base) MCG/ACT inhaler TAKE 2 PUFFS BY MOUTH EVERY 6 HOURS AS NEEDED FOR WHEEZE OR SHORTNESS OF BREATH 09/23/21   Delynn Flavin M, DO  amLODipine (NORVASC) 5 MG tablet Take 1 tablet (5 mg total) by mouth daily. 04/13/23   Raliegh Ip, DO  atorvastatin (LIPITOR) 40 MG tablet Take 1 tablet (40 mg total) by mouth daily. 04/13/23   Raliegh Ip, DO  Azelastine HCl 137 MCG/SPRAY SOLN Place 1 spray into both nostrils daily as needed (congestion). 01/15/22   [provider]  Biotin 10 MG CAPS Take 10 mg by mouth daily.    [provider]  budesonide-formoterol (SYMBICORT) 80-4.5 MCG/ACT inhaler Take 2 puffs first  thing in am and then another 2 puffs about 12 hours later. 09/22/22   Nyoka Cowden, MD  buPROPion ER (WELLBUTRIN SR) 100 MG 12 hr tablet Take 1 tablet (100 mg total) by mouth daily for 28 days, THEN 1 tablet (100 mg total) every other day for 28 days. Then stop. 04/13/23 06/08/23  Raliegh Ip, DO  Calcium Carbonate (CALCIUM 500 PO) Take 500 mg by mouth daily.    [provider]  Cholecalciferol (VITAMIN D3) 125 MCG (5000 UT) capsule Take 5,000 Units by mouth daily.    [provider]  COLLAGEN PO Take 1  tablet by mouth daily.    [provider]  diclofenac Sodium (VOLTAREN) 1 % GEL Apply 2 g topically 4 (four) times daily as needed (arthritis). 11/04/22   Raliegh Ip, DO  EPINEPHrine 0.3 mg/0.3 mL IJ SOAJ injection Inject 0.3 mg into the muscle as needed for anaphylaxis. 09/07/19   [provider]  fexofenadine (ALLEGRA) 180 MG tablet Take 1 tablet (180 mg total) by mouth daily. Patient taking differently: Take 180 mg by mouth at bedtime. 09/18/21   Raliegh Ip, DO  fluticasone (FLONASE) 50 MCG/ACT nasal spray SPRAY 2 SPRAYS INTO EACH NOSTRIL EVERY DAY Patient taking differently: Place 1 spray into both nostrils 2 (two) times daily. 05/09/22   Raliegh Ip, DO  gabapentin (NEURONTIN) 300 MG capsule Take 1 capsule (300 mg total) by mouth 4 (four) times daily. Patient taking differently: Take 300 mg by mouth 2 (two) times daily. 09/22/22   Nyoka Cowden, MD  ketoconazole (NIZORAL) 2 % shampoo APPLY 1 APPLICATION TOPICALLY 2 (TWO) TIMES A WEEK. Patient taking differently: Apply 1 Application topically daily as needed for irritation. 03/03/22   Raliegh Ip, DO  linaclotide (LINZESS) 290 MCG CAPS capsule Take 1 capsule (290 mcg total) by mouth daily before breakfast. 04/13/23   Raliegh Ip, DO  Melatonin 5 MG CAPS Take 5 mg by mouth at bedtime as needed (sleep).    [provider]  Multiple Vitamin (MULTI-VITAMIN PO) Take 1 tablet by mouth daily.     [provider]  Polyethylene Glycol 400 (BLINK TEARS OP) Place 1 drop into both eyes daily.    [provider]  Probiotic Product (PROBIOTIC PO) Take 1 capsule by mouth daily.    [provider]  sucralfate (CARAFATE) 1 g tablet Take 1 tablet (1 g total) by mouth 4 (four) times daily -  before meals and at bedtime. Patient taking differently: Take 1 g by mouth 4 (four) times daily as needed (heartburn). 01/13/22   Raliegh Ip, DO  SUMAtriptan (IMITREX) 100 MG  tablet TAKE 1 TABLET AS NEED FOR MIGRAINE. MAY REPEAT IN 2 HOURS IF HEADACHE PERSISTS OR RECURS. 04/13/23   Delynn Flavin M, DO  traZODone (DESYREL) 150 MG tablet Take 1 tablet (150 mg total) by mouth at bedtime as needed for sleep. 04/13/23   Raliegh Ip, DO  trospium (SANCTURA) 20 MG tablet Take 1 tablet (20 mg total) by mouth 2 (two) times daily. 04/13/23   Raliegh Ip, DO  Turmeric Curcumin 500 MG CAPS Take 500 mg by mouth daily.    [provider]  valsartan-hydrochlorothiazide (DIOVAN-HCT) 320-25 MG tablet Take 1 tablet by mouth daily. 04/13/23   Raliegh Ip, DO      Allergies    Lovenox [enoxaparin sodium], Moxifloxacin, Quinolones, Mucinex sinus-max [phenylephrine-apap-guaifenesin], and Sulfamethoxazole-trimethoprim    Review of Systems   Review  of Systems  Gastrointestinal:  Positive for abdominal pain.    Physical Exam Updated Vital Signs BP (!) 180/77   Pulse 63   Temp 98.3 F (36.8 C) (Oral)   Resp 16   SpO2 94%  Physical Exam Vitals and nursing note reviewed.  Constitutional:      Appearance: Normal appearance.  HENT:     Head: Normocephalic and atraumatic.     Mouth/Throat:     Mouth: Mucous membranes are moist.  Eyes:     General: No scleral icterus. Cardiovascular:     Rate and Rhythm: Normal rate and regular rhythm.     Pulses: Normal pulses.     Heart sounds: Normal heart sounds.  Pulmonary:     Effort: Pulmonary effort is normal.     Breath sounds: Normal breath sounds.  Abdominal:     General: Abdomen is flat.     Palpations: Abdomen is soft.     Tenderness: There is abdominal tenderness in the right lower quadrant and periumbilical area.  Musculoskeletal:        General: No deformity.  Skin:    General: Skin is warm.     Findings: No rash.  Neurological:     General: No focal deficit present.     Mental Status: She is alert.  Psychiatric:        Mood and Affect: Mood normal.    ED Results / Procedures /  Treatments   Labs (all labs ordered are listed, but only abnormal results are displayed) Labs Reviewed  COMPREHENSIVE METABOLIC PANEL - Abnormal; Notable for the following components:      Result Value   Glucose, Bld 137 (*)    All other components within normal limits  URINALYSIS, ROUTINE W REFLEX MICROSCOPIC - Abnormal; Notable for the following components:   Leukocytes,Ua MODERATE (*)    All other components within normal limits  LIPASE, BLOOD  CBC    EKG None  Radiology No results found.  Procedures Procedures  {Document cardiac monitor, telemetry assessment procedure when appropriate:1}  Medications Ordered in ED Medications - No data to display  ED Course/ Medical Decision Making/ A&P   {   Click here for ABCD2, HEART and other calculatorsREFRESH Note before signing :1}                              Medical Decision Making Amount and/or Complexity of Data Reviewed Labs: ordered.   ***  {Document critical care time when appropriate:1} {Document review of labs and clinical decision tools ie heart score, Chads2Vasc2 etc:1}  {Document your independent review of radiology images, and any outside records:1} {Document your discussion with family members, caretakers, and with consultants:1} {Document social determinants of health affecting pt's care:1} {Document your decision making why or why not admission, treatments were needed:1} Final Clinical Impression(s) / ED Diagnoses Final diagnoses:  None    Rx / DC Orders ED Discharge Orders     None

## 2023-04-15 MED ORDER — NAPROXEN 500 MG PO TABS: 500.0000 mg | ORAL_TABLET | Freq: Two times a day (BID) | ORAL | 0 refills | Status: DC

## 2023-04-15 NOTE — Discharge Instructions (Addendum)
Please take your medications as prescribed. Take tylenol/ibuprofen for pain. I recommend close follow-up with PCP for reevaluation.  Please do not hesitate to return to emergency department if worrisome signs symptoms we discussed become apparent.

## 2023-04-28 ENCOUNTER — Ambulatory Visit (INDEPENDENT_AMBULATORY_CARE_PROVIDER_SITE_OTHER): Payer: Medicare HMO | Admitting: *Deleted

## 2023-04-28 VITALS — BP 116/71 | HR 67

## 2023-04-28 DIAGNOSIS — I10 Essential (primary) hypertension: Secondary | ICD-10-CM

## 2023-04-28 NOTE — Progress Notes (Signed)
Patient in today for blood pressure check. BP well within normal range. Will forward to PCP.

## 2023-05-01 ENCOUNTER — Ambulatory Visit: Payer: Medicare HMO | Admitting: Family Medicine

## 2023-05-11 ENCOUNTER — Ambulatory Visit (INDEPENDENT_AMBULATORY_CARE_PROVIDER_SITE_OTHER): Payer: Medicare HMO | Admitting: Nurse Practitioner

## 2023-05-11 ENCOUNTER — Encounter: Payer: Self-pay | Admitting: Nurse Practitioner

## 2023-05-11 ENCOUNTER — Ambulatory Visit: Payer: Medicare HMO | Admitting: Nurse Practitioner

## 2023-05-11 VITALS — BP 134/71 | HR 69 | Temp 97.5°F | Ht 69.0 in | Wt 235.8 lb

## 2023-05-11 DIAGNOSIS — R051 Acute cough: Secondary | ICD-10-CM | POA: Insufficient documentation

## 2023-05-11 DIAGNOSIS — H6502 Acute serous otitis media, left ear: Secondary | ICD-10-CM | POA: Insufficient documentation

## 2023-05-11 DIAGNOSIS — R0981 Nasal congestion: Secondary | ICD-10-CM | POA: Diagnosis not present

## 2023-05-11 MED ORDER — AMOXICILLIN 875 MG PO TABS
875.0000 mg | ORAL_TABLET | Freq: Two times a day (BID) | ORAL | 0 refills | Status: DC
Start: 2023-05-11 — End: 2023-08-04

## 2023-05-11 NOTE — Progress Notes (Signed)
Acute Office Visit  Subjective:     Patient ID: Kristina Mclaughlin, female    DOB: Feb 19, 1951, 72 y.o.   MRN: [id number]  Chief Complaint  Patient presents with   Nasal Congestion    Exposed to covid on a trip having congestion, coughing for few days   Cough    HPI Kristina Mclaughlin 72 year old female seen today on 05/11/23 as an acute visit for neck pain nasal congestion and cough due to exposure to COVID. Client reports that she recently traveled to [facility] and normal way back 15 people from the boss had COVID pain she started experiencing cough no fever no chills, no diarrhea no nausea vomiting.  She requested to be tested for COVID.   ROS Negative unless indicated in HPI    Objective:    BP 134/71   Pulse 69   Temp (!) 97.5 F (36.4 C) (Temporal)   Ht 5\' 9"  (1.753 m)   Wt 235 lb 12.8 oz (107 kg)   SpO2 95%   BMI 34.82 kg/m  BP Readings from Last 3 Encounters:  05/11/23 134/71  04/28/23 116/71  04/14/23 (!) 173/70   Wt Readings from Last 3 Encounters:  05/11/23 235 lb 12.8 oz (107 kg)  04/13/23 236 lb (107 kg)  01/23/23 231 lb (104.8 kg)      Physical Exam Constitutional:      Appearance: Normal appearance.  HENT:     Right Ear: Hearing, tympanic membrane and ear canal normal.     Left Ear: Drainage and tenderness present. Tympanic membrane is erythematous.  Eyes:     General: No scleral icterus.    Extraocular Movements: Extraocular movements intact.     Conjunctiva/sclera: Conjunctivae normal.     Pupils: Pupils are equal, round, and reactive to light.  Cardiovascular:     Rate and Rhythm: Normal rate and regular rhythm.  Pulmonary:     Effort: Pulmonary effort is normal.     Breath sounds: Normal breath sounds.  Abdominal:     General: Bowel sounds are normal.     Palpations: Abdomen is soft.  Musculoskeletal:        General: Normal range of motion.     Cervical back: Normal range of motion.     Right lower leg: No edema.      Left lower leg: No edema.  Skin:    General: Skin is warm and dry.     Findings: No rash.  Neurological:     Mental Status: She is alert and oriented to person, place, and time. Mental status is at baseline.  Psychiatric:        Mood and Affect: Mood normal.        Behavior: Behavior normal.        Thought Content: Thought content normal.        Judgment: Judgment normal.     No results found for any visits on 05/11/23.      Assessment & Plan:  Acute cough -     Novel Coronavirus, NAA ([facility])  Nasal congestion -     Novel Coronavirus, NAA ([facility])  Non-recurrent acute serous otitis media of left ear -     Amoxicillin; Take 1 tablet (875 mg total) by mouth 2 (two) times daily.  Dispense: 14 tablet; Refill: 0  Carmeshia is a 72 year old Caucasian female seen today for otitis media of the left ear, no acute distress Amoxicillin 875 mg twice daily for 7 days.   Take  all antibiotic until gone awaiting for COVID results  The above assessment and management plan was discussed with the patient. The patient verbalized understanding of and has agreed to the management plan. Patient is aware to call the clinic if they develop any new symptoms or if symptoms persist or worsen. Patient is aware when to return to the clinic for a follow-up visit. Patient educated on when it is appropriate to go to the emergency department.  Return for follow-up.  Arrie Aran Santa Lighter, DNP [facility] [address] [place], [place] [address] [phone]

## 2023-05-12 LAB — NOVEL CORONAVIRUS, NAA: SARS-CoV-2, NAA: NOT DETECTED

## 2023-05-14 ENCOUNTER — Telehealth: Payer: Self-pay

## 2023-05-14 NOTE — Telephone Encounter (Signed)
Transition Care Management Follow-up Telephone Call Date of discharge and from where: 04/15/2023 [facility] How have you been since you were released from the hospital? Patient stated she is still experiencing stomach pain and will be following-up with her Gastroenterologist. Any questions or concerns? No  Items Reviewed: Did the pt receive and understand the discharge instructions provided? Yes  Medications obtained and verified? Yes  Other? No  Any new allergies since your discharge? No  Dietary orders reviewed? Yes Do you have support at home? Yes   Follow up appointments reviewed:  PCP Hospital f/u appt confirmed? No  Scheduled to see  on  @ . Specialist Hospital f/u appt confirmed? Yes  Scheduled to see Charlott Rakes, MD on 05/05/2023 @ [facility]. Are transportation arrangements needed? No  If their condition worsens, is the pt aware to call PCP or go to the Emergency Dept.? Yes Was the patient provided with contact information for the PCP's office or ED? Yes Was to pt encouraged to call back with questions or concerns? Yes   Jahi Roza [facility]  [facility], [facility] Guide Direct Dial: [phone]  Website: [email or web address]

## 2023-05-18 DIAGNOSIS — J301 Allergic rhinitis due to pollen: Secondary | ICD-10-CM | POA: Diagnosis not present

## 2023-05-18 DIAGNOSIS — J3089 Other allergic rhinitis: Secondary | ICD-10-CM | POA: Diagnosis not present

## 2023-05-18 DIAGNOSIS — J3081 Allergic rhinitis due to animal (cat) (dog) hair and dander: Secondary | ICD-10-CM | POA: Diagnosis not present

## 2023-05-22 ENCOUNTER — Ambulatory Visit (INDEPENDENT_AMBULATORY_CARE_PROVIDER_SITE_OTHER): Payer: Medicare HMO

## 2023-05-22 DIAGNOSIS — Z23 Encounter for immunization: Secondary | ICD-10-CM

## 2023-05-26 DIAGNOSIS — J3089 Other allergic rhinitis: Secondary | ICD-10-CM | POA: Diagnosis not present

## 2023-05-26 DIAGNOSIS — J3081 Allergic rhinitis due to animal (cat) (dog) hair and dander: Secondary | ICD-10-CM | POA: Diagnosis not present

## 2023-05-26 DIAGNOSIS — J301 Allergic rhinitis due to pollen: Secondary | ICD-10-CM | POA: Diagnosis not present

## 2023-06-02 DIAGNOSIS — J3081 Allergic rhinitis due to animal (cat) (dog) hair and dander: Secondary | ICD-10-CM | POA: Diagnosis not present

## 2023-06-02 DIAGNOSIS — J3089 Other allergic rhinitis: Secondary | ICD-10-CM | POA: Diagnosis not present

## 2023-06-02 DIAGNOSIS — J301 Allergic rhinitis due to pollen: Secondary | ICD-10-CM | POA: Diagnosis not present

## 2023-06-12 DIAGNOSIS — J3081 Allergic rhinitis due to animal (cat) (dog) hair and dander: Secondary | ICD-10-CM | POA: Diagnosis not present

## 2023-06-12 DIAGNOSIS — J3089 Other allergic rhinitis: Secondary | ICD-10-CM | POA: Diagnosis not present

## 2023-06-12 DIAGNOSIS — J301 Allergic rhinitis due to pollen: Secondary | ICD-10-CM | POA: Diagnosis not present

## 2023-06-18 DIAGNOSIS — J3089 Other allergic rhinitis: Secondary | ICD-10-CM | POA: Diagnosis not present

## 2023-06-18 DIAGNOSIS — J3489 Other specified disorders of nose and nasal sinuses: Secondary | ICD-10-CM | POA: Diagnosis not present

## 2023-06-18 DIAGNOSIS — J343 Hypertrophy of nasal turbinates: Secondary | ICD-10-CM | POA: Diagnosis not present

## 2023-06-18 DIAGNOSIS — R490 Dysphonia: Secondary | ICD-10-CM | POA: Diagnosis not present

## 2023-06-18 DIAGNOSIS — J301 Allergic rhinitis due to pollen: Secondary | ICD-10-CM | POA: Diagnosis not present

## 2023-06-18 DIAGNOSIS — J3081 Allergic rhinitis due to animal (cat) (dog) hair and dander: Secondary | ICD-10-CM | POA: Diagnosis not present

## 2023-06-18 DIAGNOSIS — K219 Gastro-esophageal reflux disease without esophagitis: Secondary | ICD-10-CM | POA: Diagnosis not present

## 2023-07-15 DIAGNOSIS — M1712 Unilateral primary osteoarthritis, left knee: Secondary | ICD-10-CM | POA: Diagnosis not present

## 2023-07-22 DIAGNOSIS — M1712 Unilateral primary osteoarthritis, left knee: Secondary | ICD-10-CM | POA: Diagnosis not present

## 2023-07-24 ENCOUNTER — Other Ambulatory Visit: Payer: Self-pay | Admitting: Gastroenterology

## 2023-07-24 ENCOUNTER — Ambulatory Visit
Admission: RE | Admit: 2023-07-24 | Discharge: 2023-07-24 | Disposition: A | Discharge status: Home / Self Care | Payer: Medicare HMO | Source: Ambulatory Visit | Attending: Gastroenterology | Admitting: Gastroenterology

## 2023-07-24 DIAGNOSIS — R140 Abdominal distension (gaseous): Secondary | ICD-10-CM | POA: Diagnosis not present

## 2023-07-24 DIAGNOSIS — K5900 Constipation, unspecified: Secondary | ICD-10-CM | POA: Diagnosis not present

## 2023-07-24 DIAGNOSIS — R109 Unspecified abdominal pain: Secondary | ICD-10-CM | POA: Diagnosis not present

## 2023-07-27 DIAGNOSIS — H5203 Hypermetropia, bilateral: Secondary | ICD-10-CM | POA: Diagnosis not present

## 2023-07-27 DIAGNOSIS — H524 Presbyopia: Secondary | ICD-10-CM | POA: Diagnosis not present

## 2023-07-27 DIAGNOSIS — H18513 Endothelial corneal dystrophy, bilateral: Secondary | ICD-10-CM | POA: Diagnosis not present

## 2023-07-27 DIAGNOSIS — H52223 Regular astigmatism, bilateral: Secondary | ICD-10-CM | POA: Diagnosis not present

## 2023-07-27 DIAGNOSIS — H2513 Age-related nuclear cataract, bilateral: Secondary | ICD-10-CM | POA: Diagnosis not present

## 2023-07-30 DIAGNOSIS — M1712 Unilateral primary osteoarthritis, left knee: Secondary | ICD-10-CM | POA: Diagnosis not present

## 2023-08-04 ENCOUNTER — Ambulatory Visit: Payer: Medicare HMO | Admitting: Family

## 2023-08-04 ENCOUNTER — Encounter: Payer: Self-pay | Admitting: Family

## 2023-08-04 VITALS — BP 131/72 | HR 76 | Temp 97.6°F | Ht 69.0 in | Wt 232.0 lb

## 2023-08-04 DIAGNOSIS — J45991 Cough variant asthma: Secondary | ICD-10-CM

## 2023-08-04 DIAGNOSIS — H6502 Acute serous otitis media, left ear: Secondary | ICD-10-CM

## 2023-08-04 MED ORDER — PREDNISONE 20 MG PO TABS: 40.0000 mg | ORAL_TABLET | Freq: Every day | ORAL | 0 refills | Status: AC

## 2023-08-04 MED ORDER — BENZONATATE 200 MG PO CAPS: 200.0000 mg | ORAL_CAPSULE | Freq: Three times a day (TID) | ORAL | 1 refills | Status: DC | PRN

## 2023-08-04 MED ORDER — AMOXICILLIN-POT CLAVULANATE 875-125 MG PO TABS: 1.0000 | ORAL_TABLET | Freq: Two times a day (BID) | ORAL | 0 refills | Status: DC

## 2023-08-04 NOTE — Patient Instructions (Signed)

## 2023-08-04 NOTE — Progress Notes (Signed)
 Subjective:    Patient ID: Kristina Mclaughlin, female    DOB: 1951/02/19, 72 y.o.   MRN: [id number]  Chief Complaint  Patient presents with   sick    Started two weeks ago. Symptoms got worse. Cough,sneezing, green phlegm, left ear pain, night sweats.   Pt presents to the office today with cough and left ear pain that started two weeks ago.  Cough This is a new problem. The current episode started 1 to 4 weeks ago. The problem has been gradually worsening. The problem occurs every few minutes. The cough is Productive of purulent sputum. Associated symptoms include chills, ear congestion, ear pain, headaches, nasal congestion, a sore throat and shortness of breath. Pertinent negatives include no fever, myalgias or wheezing. She has tried rest and OTC cough suppressant for the symptoms. The treatment provided mild relief. Her past medical history is significant for asthma.      Review of Systems  Constitutional:  Positive for chills. Negative for fever.  HENT:  Positive for ear pain and sore throat.   Respiratory:  Positive for cough and shortness of breath. Negative for wheezing.   Musculoskeletal:  Negative for myalgias.  Neurological:  Positive for headaches.  All other systems reviewed and are negative.   Social History   Socioeconomic History   Marital status: Married    Spouse name: Not on file   Number of children: 2   Years of education: Not on file   Highest education level: Not on file  Occupational History   Occupation: Purchasing   Tobacco Use   Smoking status: Former    Current packs/day: 0.00    Average packs/day: 0.3 packs/day for 2.0 years (0.5 ttl pk-yrs)    Types: Cigarettes    Start date: 7    Quit date: 1980    Years since quitting: 45.0   Smokeless tobacco: Never  Vaping Use   Vaping status: Never Used  Substance and Sexual Activity   Alcohol use: No    Alcohol/week: 0.0 standard drinks of alcohol   Drug use: No   Sexual activity: Not on  file  Other Topics Concern   Not on file  Social History Narrative   She is a retired Therapist, Occupational.  She is married with 2 daughters and 4 grandchildren, all who live locally.  She tries to stay active.   Social Drivers of Corporate Investment Banker Strain: Low Risk  (01/23/2023)   Overall Financial Resource Strain (CARDIA)    Difficulty of Paying Living Expenses: Not hard at all  Food Insecurity: No Food Insecurity (01/23/2023)   Hunger Vital Sign    Worried About Running Out of Food in the Last Year: Never true    Ran Out of Food in the Last Year: Never true  Transportation Needs: No Transportation Needs (11/19/2022)   PRAPARE - Administrator, Civil Service (Medical): No    Lack of Transportation (Non-Medical): No  Physical Activity: Insufficiently Active (01/23/2023)   Exercise Vital Sign    Days of Exercise per Week: 3 days    Minutes of Exercise per Session: 30 min  Stress: No Stress Concern Present (01/23/2023)   [employer] of Occupational Health - Occupational Stress Questionnaire    Feeling of Stress : Not at all  Social Connections: Socially Integrated (01/23/2023)   Social Connection and Isolation Panel [NHANES]    Frequency of Communication with Friends and Family: More than three times a week  Frequency of Social Gatherings with Friends and Family: More than three times a week    Attends Religious Services: More than 4 times per year    Active Member of [employer] or Organizations: Yes    Attends Engineer, Structural: More than 4 times per year    Marital Status: Married   Family History  Problem Relation Age of Onset   Bladder Cancer Mother    Allergies Mother    Heart disease Mother    Allergies Sister    Alzheimer's disease Father    Breast cancer Neg Hx         Objective:   Physical Exam Vitals reviewed.  Constitutional:      General: She is not in acute distress.    Appearance: She is well-developed.  HENT:      Head: Normocephalic and atraumatic.     Right Ear: Tympanic membrane normal.     Left Ear: Tenderness present. Tympanic membrane is erythematous.  Eyes:     Pupils: Pupils are equal, round, and reactive to light.  Neck:     Thyroid : No thyromegaly.  Cardiovascular:     Rate and Rhythm: Normal rate and regular rhythm.     Heart sounds: Normal heart sounds. No murmur heard. Pulmonary:     Effort: Pulmonary effort is normal. No respiratory distress.     Breath sounds: Normal breath sounds. No wheezing.  Abdominal:     General: Bowel sounds are normal. There is no distension.     Palpations: Abdomen is soft.     Tenderness: There is no abdominal tenderness.  Musculoskeletal:        General: No tenderness. Normal range of motion.     Cervical back: Normal range of motion and neck supple.  Skin:    General: Skin is warm and dry.  Neurological:     Mental Status: She is alert and oriented to person, place, and time.     Cranial Nerves: No cranial nerve deficit.     Deep Tendon Reflexes: Reflexes are normal and symmetric.  Psychiatric:        Behavior: Behavior normal.        Thought Content: Thought content normal.        Judgment: Judgment normal.       BP 131/72   Pulse 76   Temp 97.6 F (36.4 C) (Temporal)   Ht 5' 9 (1.753 m)   Wt 232 lb (105.2 kg)   SpO2 92%   BMI 34.26 kg/m      Assessment & Plan:  Kristina Mclaughlin comes in today with chief complaint of sick (Started two weeks ago. Symptoms got worse. Cough,sneezing, green phlegm, left ear pain, night sweats.)   Diagnosis and orders addressed:  1. Cough variant asthma (Primary) - Take meds as prescribed - Use a cool mist humidifier  -Use saline nose sprays frequently -Force fluids -For any cough or congestion  Use plain Mucinex - regular strength or max strength is fine -For fever or aces or pains- take tylenol  or ibuprofen. -Throat lozenges if help -Follow up if symptoms worsen or do not improve   - predniSONE  (DELTASONE ) 20 MG tablet; Take 2 tablets (40 mg total) by mouth daily with breakfast for 5 days.  Dispense: 10 tablet; Refill: 0 - benzonatate  (TESSALON ) 200 MG capsule; Take 1 capsule (200 mg total) by mouth 3 (three) times daily as needed.  Dispense: 30 capsule; Refill: 1  2. Non-recurrent acute serous otitis media of left  ear - amoxicillin -clavulanate (AUGMENTIN ) 875-125 MG tablet; Take 1 tablet by mouth 2 (two) times daily.  Dispense: 14 tablet; Refill: 0     Bari Learn, FNP

## 2023-08-07 DIAGNOSIS — J301 Allergic rhinitis due to pollen: Secondary | ICD-10-CM | POA: Diagnosis not present

## 2023-08-07 DIAGNOSIS — J3089 Other allergic rhinitis: Secondary | ICD-10-CM | POA: Diagnosis not present

## 2023-08-07 DIAGNOSIS — J3081 Allergic rhinitis due to animal (cat) (dog) hair and dander: Secondary | ICD-10-CM | POA: Diagnosis not present

## 2023-08-10 DIAGNOSIS — Z83719 Family history of colon polyps, unspecified: Secondary | ICD-10-CM | POA: Diagnosis not present

## 2023-08-10 DIAGNOSIS — K5904 Chronic idiopathic constipation: Secondary | ICD-10-CM | POA: Diagnosis not present

## 2023-08-10 DIAGNOSIS — R1084 Generalized abdominal pain: Secondary | ICD-10-CM | POA: Diagnosis not present

## 2023-08-13 DIAGNOSIS — J3089 Other allergic rhinitis: Secondary | ICD-10-CM | POA: Diagnosis not present

## 2023-08-13 DIAGNOSIS — J301 Allergic rhinitis due to pollen: Secondary | ICD-10-CM | POA: Diagnosis not present

## 2023-08-13 DIAGNOSIS — J3081 Allergic rhinitis due to animal (cat) (dog) hair and dander: Secondary | ICD-10-CM | POA: Diagnosis not present

## 2023-08-20 DIAGNOSIS — J3089 Other allergic rhinitis: Secondary | ICD-10-CM | POA: Diagnosis not present

## 2023-08-20 DIAGNOSIS — J301 Allergic rhinitis due to pollen: Secondary | ICD-10-CM | POA: Diagnosis not present

## 2023-08-20 DIAGNOSIS — J3081 Allergic rhinitis due to animal (cat) (dog) hair and dander: Secondary | ICD-10-CM | POA: Diagnosis not present

## 2023-08-25 DIAGNOSIS — K648 Other hemorrhoids: Secondary | ICD-10-CM | POA: Diagnosis not present

## 2023-08-25 DIAGNOSIS — R1084 Generalized abdominal pain: Secondary | ICD-10-CM | POA: Diagnosis not present

## 2023-08-25 DIAGNOSIS — Z83719 Family history of colon polyps, unspecified: Secondary | ICD-10-CM | POA: Diagnosis not present

## 2023-08-25 DIAGNOSIS — K5730 Diverticulosis of large intestine without perforation or abscess without bleeding: Secondary | ICD-10-CM | POA: Diagnosis not present

## 2023-08-26 DIAGNOSIS — J301 Allergic rhinitis due to pollen: Secondary | ICD-10-CM | POA: Diagnosis not present

## 2023-08-26 DIAGNOSIS — J3089 Other allergic rhinitis: Secondary | ICD-10-CM | POA: Diagnosis not present

## 2023-08-26 DIAGNOSIS — J3081 Allergic rhinitis due to animal (cat) (dog) hair and dander: Secondary | ICD-10-CM | POA: Diagnosis not present

## 2023-08-27 DIAGNOSIS — J3081 Allergic rhinitis due to animal (cat) (dog) hair and dander: Secondary | ICD-10-CM | POA: Diagnosis not present

## 2023-08-27 DIAGNOSIS — J3089 Other allergic rhinitis: Secondary | ICD-10-CM | POA: Diagnosis not present

## 2023-08-27 DIAGNOSIS — J301 Allergic rhinitis due to pollen: Secondary | ICD-10-CM | POA: Diagnosis not present

## 2023-09-08 DIAGNOSIS — J3489 Other specified disorders of nose and nasal sinuses: Secondary | ICD-10-CM | POA: Diagnosis not present

## 2023-09-08 DIAGNOSIS — R040 Epistaxis: Secondary | ICD-10-CM | POA: Diagnosis not present

## 2023-09-21 DIAGNOSIS — J3089 Other allergic rhinitis: Secondary | ICD-10-CM | POA: Diagnosis not present

## 2023-09-21 DIAGNOSIS — J301 Allergic rhinitis due to pollen: Secondary | ICD-10-CM | POA: Diagnosis not present

## 2023-09-21 DIAGNOSIS — J3081 Allergic rhinitis due to animal (cat) (dog) hair and dander: Secondary | ICD-10-CM | POA: Diagnosis not present

## 2023-09-23 DIAGNOSIS — J301 Allergic rhinitis due to pollen: Secondary | ICD-10-CM | POA: Diagnosis not present

## 2023-09-23 DIAGNOSIS — J45991 Cough variant asthma: Secondary | ICD-10-CM | POA: Diagnosis not present

## 2023-09-23 DIAGNOSIS — J3081 Allergic rhinitis due to animal (cat) (dog) hair and dander: Secondary | ICD-10-CM | POA: Diagnosis not present

## 2023-09-23 DIAGNOSIS — J3089 Other allergic rhinitis: Secondary | ICD-10-CM | POA: Diagnosis not present

## 2023-10-01 ENCOUNTER — Other Ambulatory Visit: Payer: Self-pay | Admitting: Family Medicine

## 2023-10-01 DIAGNOSIS — Z1231 Encounter for screening mammogram for malignant neoplasm of breast: Secondary | ICD-10-CM

## 2023-10-06 ENCOUNTER — Ambulatory Visit
Admission: RE | Admit: 2023-10-06 | Discharge: 2023-10-06 | Disposition: A | Discharge status: Home / Self Care | Payer: Medicare HMO | Source: Ambulatory Visit | Attending: Family Medicine | Admitting: Family Medicine

## 2023-10-06 DIAGNOSIS — Z1231 Encounter for screening mammogram for malignant neoplasm of breast: Secondary | ICD-10-CM

## 2023-10-08 IMAGING — MG MM DIGITAL SCREENING BILAT W/ TOMO AND CAD
6 of 10 series · 6 of 30 positions shown · non-contrast
Comparison: Previous exam(s).

CLINICAL DATA: Screening.

EXAM:
DIGITAL SCREENING BILATERAL MAMMOGRAM WITH TOMOSYNTHESIS AND CAD
TECHNIQUE: Bilateral screening digital craniocaudal and mediolateral oblique
mammograms were obtained. Bilateral screening digital breast
tomosynthesis was performed. The images were evaluated with
computer-aided detection.

[L CC synth-2D]
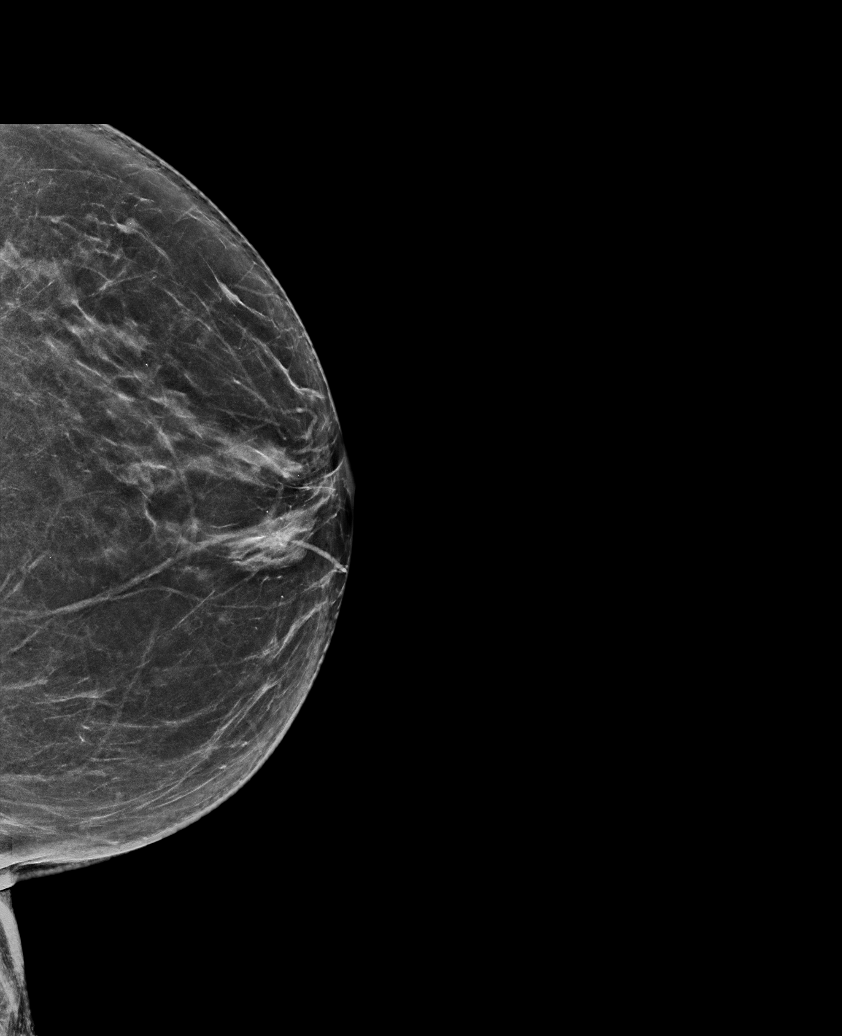

[R MLO synth-2D]
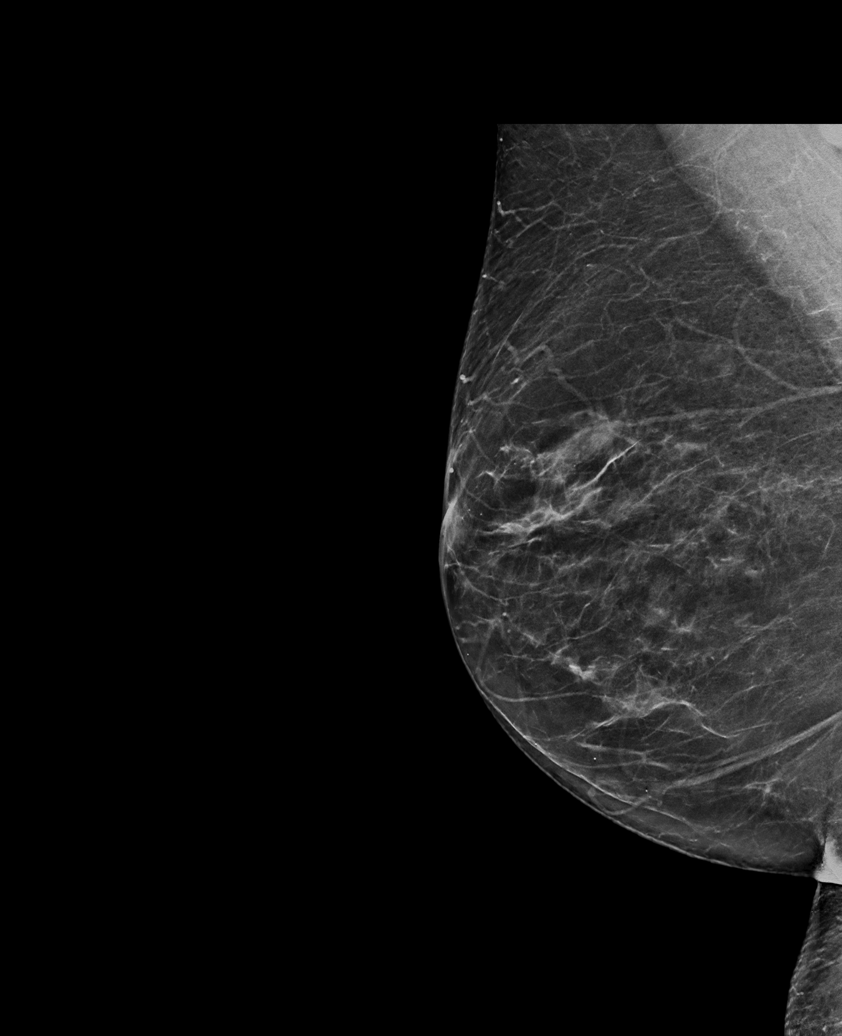

[L XCCL synth-2D]
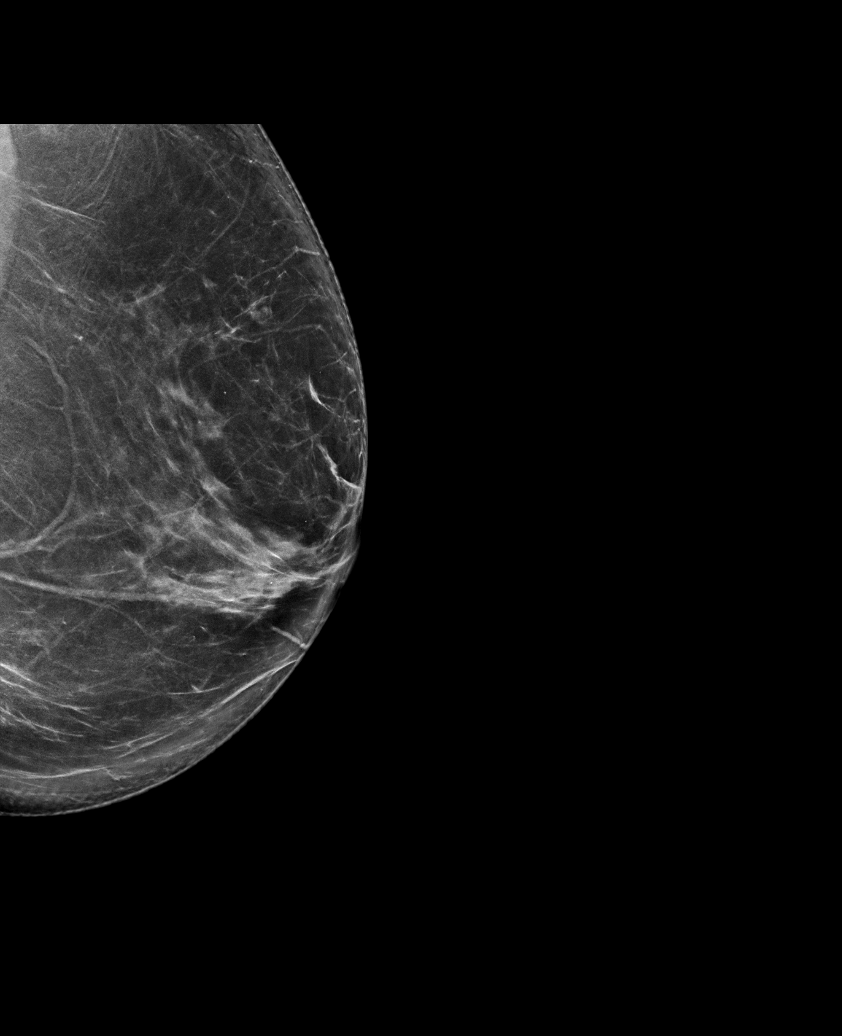

[L MLO synth-2D]
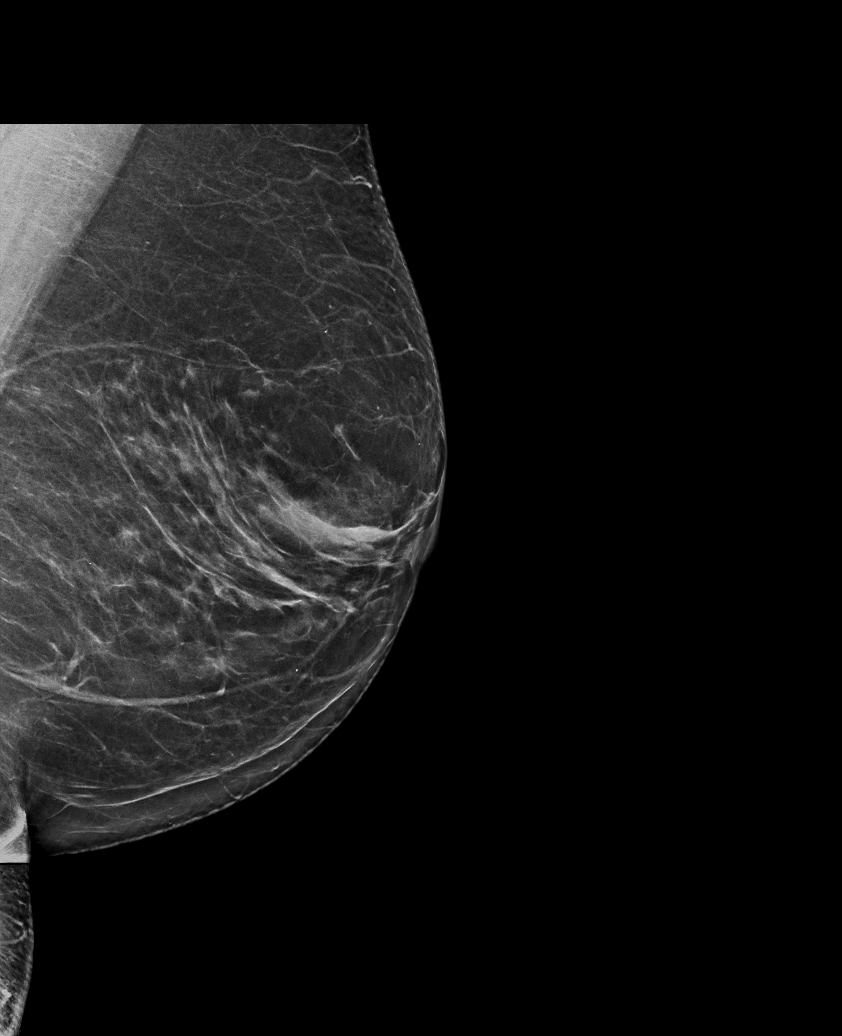

[R CC synth-2D]
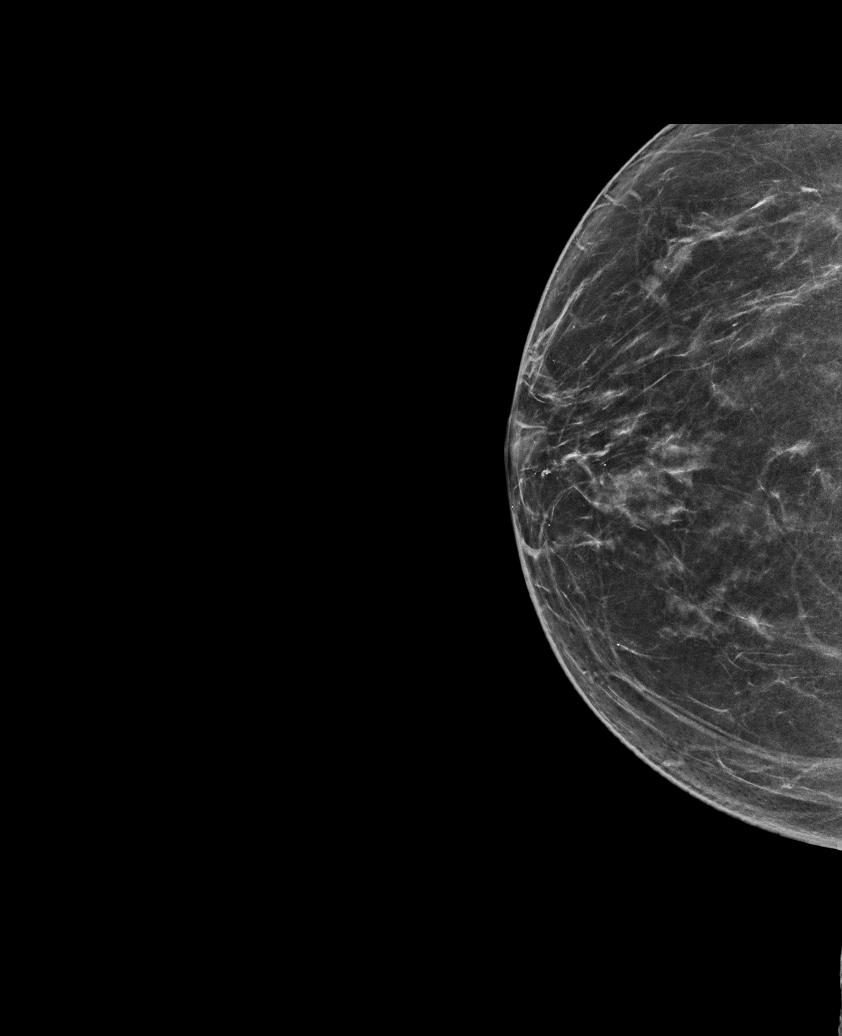

[R CC tomo · tomo slice 35/69.0]
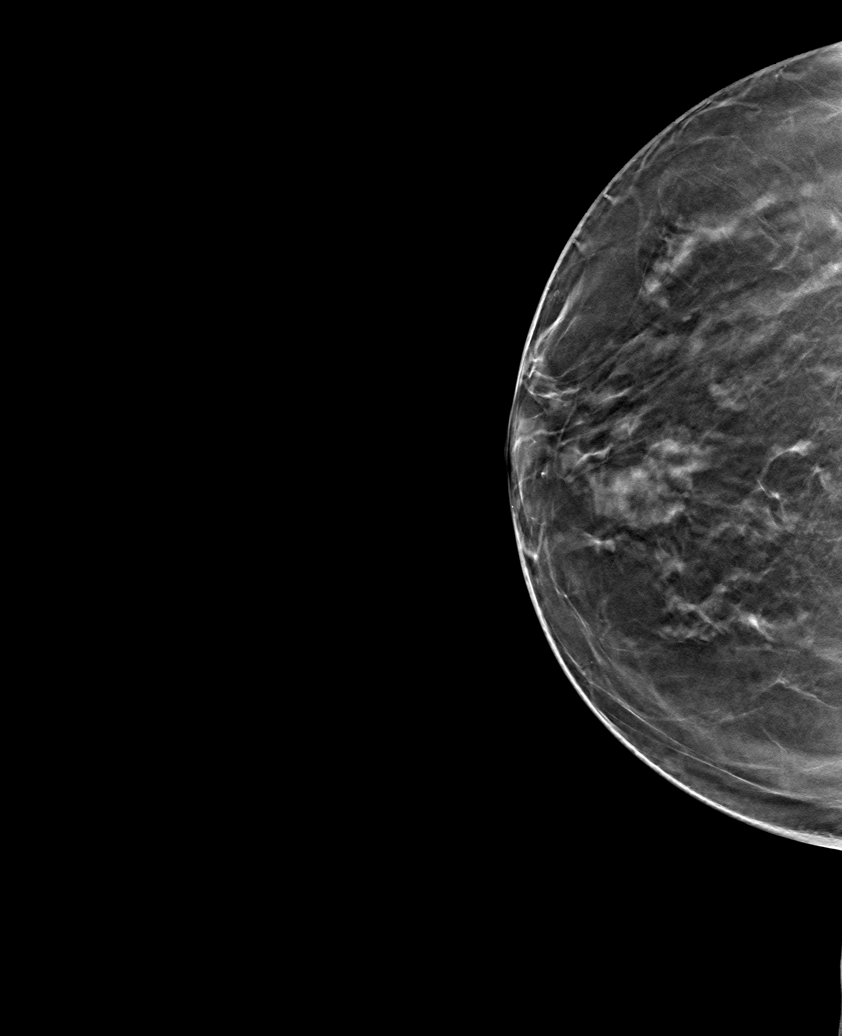

[6 of 30 positions shown; findings below may reference images not displayed]

ACR Breast Density Category b: There are scattered areas of
fibroglandular density.
FINDINGS: There are no findings suspicious for malignancy.
IMPRESSION: No mammographic evidence of malignancy. A result letter of this
screening mammogram will be mailed directly to the patient.

RECOMMENDATION:
Screening mammogram in one year. (Code:51-O-LD2)

BI-RADS CATEGORY  1: Negative.

## 2023-10-12 ENCOUNTER — Telehealth: Payer: Self-pay | Admitting: Family Medicine

## 2023-10-12 ENCOUNTER — Other Ambulatory Visit: Payer: Self-pay | Admitting: Family Medicine

## 2023-10-12 DIAGNOSIS — R928 Other abnormal and inconclusive findings on diagnostic imaging of breast: Secondary | ICD-10-CM

## 2023-10-12 NOTE — Telephone Encounter (Signed)
 Copied from CRM [id number]. Topic: Clinical - Lab/Test Results >> Oct 12, 2023  8:49 AM Gery Pray wrote: Reason for CRM: Patient calling to go over results of her mammogram. Patient would like for Dr. [name] to giver her call at [phone].

## 2023-10-12 NOTE — Telephone Encounter (Signed)
 Spoke to patient on phone.

## 2023-10-15 DIAGNOSIS — M1712 Unilateral primary osteoarthritis, left knee: Secondary | ICD-10-CM | POA: Diagnosis not present

## 2023-10-15 DIAGNOSIS — J3081 Allergic rhinitis due to animal (cat) (dog) hair and dander: Secondary | ICD-10-CM | POA: Diagnosis not present

## 2023-10-15 DIAGNOSIS — J301 Allergic rhinitis due to pollen: Secondary | ICD-10-CM | POA: Diagnosis not present

## 2023-10-15 DIAGNOSIS — J3089 Other allergic rhinitis: Secondary | ICD-10-CM | POA: Diagnosis not present

## 2023-10-22 ENCOUNTER — Telehealth: Payer: Self-pay | Admitting: Family Medicine

## 2023-10-23 ENCOUNTER — Ambulatory Visit
Admission: RE | Admit: 2023-10-23 | Discharge: 2023-10-23 | Disposition: A | Discharge status: Home / Self Care | Source: Ambulatory Visit | Attending: Family Medicine | Admitting: Family Medicine

## 2023-10-23 DIAGNOSIS — J3081 Allergic rhinitis due to animal (cat) (dog) hair and dander: Secondary | ICD-10-CM | POA: Diagnosis not present

## 2023-10-23 DIAGNOSIS — J3089 Other allergic rhinitis: Secondary | ICD-10-CM | POA: Diagnosis not present

## 2023-10-23 DIAGNOSIS — N6310 Unspecified lump in the right breast, unspecified quadrant: Secondary | ICD-10-CM | POA: Diagnosis not present

## 2023-10-23 DIAGNOSIS — J301 Allergic rhinitis due to pollen: Secondary | ICD-10-CM | POA: Diagnosis not present

## 2023-10-23 DIAGNOSIS — R928 Other abnormal and inconclusive findings on diagnostic imaging of breast: Secondary | ICD-10-CM

## 2023-10-23 DIAGNOSIS — N6001 Solitary cyst of right breast: Secondary | ICD-10-CM | POA: Diagnosis not present

## 2023-10-26 ENCOUNTER — Telehealth: Payer: Self-pay | Admitting: Family Medicine

## 2023-10-26 NOTE — Telephone Encounter (Signed)
 Preop clearance form given to nurse for appt on 11/18/2023 for Total Knee Arthroplasty

## 2023-11-05 DIAGNOSIS — J301 Allergic rhinitis due to pollen: Secondary | ICD-10-CM | POA: Diagnosis not present

## 2023-11-05 DIAGNOSIS — J3081 Allergic rhinitis due to animal (cat) (dog) hair and dander: Secondary | ICD-10-CM | POA: Diagnosis not present

## 2023-11-05 DIAGNOSIS — J3089 Other allergic rhinitis: Secondary | ICD-10-CM | POA: Diagnosis not present

## 2023-11-09 DIAGNOSIS — S0502XA Injury of conjunctiva and corneal abrasion without foreign body, left eye, initial encounter: Secondary | ICD-10-CM | POA: Diagnosis not present

## 2023-11-10 DIAGNOSIS — J3089 Other allergic rhinitis: Secondary | ICD-10-CM | POA: Diagnosis not present

## 2023-11-10 DIAGNOSIS — J3081 Allergic rhinitis due to animal (cat) (dog) hair and dander: Secondary | ICD-10-CM | POA: Diagnosis not present

## 2023-11-10 DIAGNOSIS — J301 Allergic rhinitis due to pollen: Secondary | ICD-10-CM | POA: Diagnosis not present

## 2023-11-17 DIAGNOSIS — J301 Allergic rhinitis due to pollen: Secondary | ICD-10-CM | POA: Diagnosis not present

## 2023-11-17 DIAGNOSIS — J3081 Allergic rhinitis due to animal (cat) (dog) hair and dander: Secondary | ICD-10-CM | POA: Diagnosis not present

## 2023-11-17 DIAGNOSIS — J3089 Other allergic rhinitis: Secondary | ICD-10-CM | POA: Diagnosis not present

## 2023-11-18 ENCOUNTER — Ambulatory Visit: Admitting: Family Medicine

## 2023-11-18 ENCOUNTER — Encounter: Payer: Self-pay | Admitting: Family Medicine

## 2023-11-18 VITALS — BP 137/70 | HR 73 | Ht 69.0 in | Wt 242.0 lb

## 2023-11-18 DIAGNOSIS — Z01818 Encounter for other preprocedural examination: Secondary | ICD-10-CM

## 2023-11-18 DIAGNOSIS — E781 Pure hyperglyceridemia: Secondary | ICD-10-CM

## 2023-11-18 DIAGNOSIS — Z23 Encounter for immunization: Secondary | ICD-10-CM

## 2023-11-18 DIAGNOSIS — R7303 Prediabetes: Secondary | ICD-10-CM | POA: Diagnosis not present

## 2023-11-18 DIAGNOSIS — M1712 Unilateral primary osteoarthritis, left knee: Secondary | ICD-10-CM

## 2023-11-18 DIAGNOSIS — I10 Essential (primary) hypertension: Secondary | ICD-10-CM | POA: Diagnosis not present

## 2023-11-18 LAB — COAGUCHEK XS/INR WAIVED
INR: 1 (ref 0.9–1.1)
Prothrombin Time: 11.7 s

## 2023-11-18 LAB — BAYER DCA HB A1C WAIVED: HB A1C (BAYER DCA - WAIVED): 6.4 % — ABNORMAL HIGH (ref 4.8–5.6)

## 2023-11-18 NOTE — Progress Notes (Signed)
 Pt is a 73 y.o. female who is here for preoperative clearance for left knee total replacement with Dr Kristina Mclaughlin.  She has a history of prior arthroscopy on this side.  She has not had any issues with postop nausea vomiting, difficulty arousing, etc.  She denies any chest pain, shortness of breath, difficulty swallowing.  She has had some left lower extremity edema that seems to be dependent and worse in the evening but resolved each morning.  She does note that she is concerned that the amlodipine may be causing but she rarely gets much edema on the right lower extremity.  She reports chronic swelling around the left knee.  1) High Risk Cardiac Conditions  1) Recent MI - No.  2) Decompensated Heart Failure - No.  3) Unstable angina - No.  4) Symptomatic arrythmia - No.  5) Sx Valvular Disease - No.  2) Intermediate Risk Factors - DM, CKD, CVA, CHF, CAD - No.  2) Functional Status - > 4 mets (Walk, run, climb stairs) Yes.     3) Surgery Specific Risk -Intermediate (orthopaedic )           4) Further Noninvasive evaluation -   1) EKG - Yes.       2) Echo - No.   1) Worsening dyspnea   3) Stress Testing - Active Cardiac Disease - No.  5) Need for medical therapy - Beta Blocker, Statins indicated ? No.  PE: Blood pressure 137/70, pulse 73, height 5\' 9"  (1.753 m), weight 242 lb (109.8 kg), SpO2 95%.  Physical Examination: General appearance - alert, well appearing, and in no distress Mental status - alert, oriented to person, place, and time Eyes - pupils equal and reactive, extraocular eye movements intact Mouth - mucous membranes moist, pharynx normal without lesions and Mallampati 2  Neck - supple, no significant adenopathy, has full AROM in extension Chest - clear to auscultation, no wheezes, rales or rhonchi, symmetric air entry Heart - normal rate, regular rhythm, normal S1, S2, no murmurs, rubs, clicks or gallops  Preop examination - Plan: CMP14+EGFR, CBC, CoaguChek XS/INR Waived,  Bayer DCA Hb A1c Waived, EKG 12-Lead  Unilateral primary osteoarthritis, left knee - Plan: CMP14+EGFR, CBC, CoaguChek XS/INR Waived  Morbid obesity (HCC)  Essential hypertension - Plan: CMP14+EGFR, CBC, CoaguChek XS/INR Waived, CANCELED: EKG 12-Lead  Pre-diabetes - Plan: CMP14+EGFR, CBC, CoaguChek XS/INR Waived, Bayer DCA Hb A1c Waived, CANCELED: EKG 12-Lead  Hypertriglyceridemia - Plan: CMP14+EGFR, CBC, CoaguChek XS/INR Waived, CANCELED: EKG 12-Lead  Encounter for immunization - Plan: Tdap vaccine greater than or equal to 7yo IM  Tetanus shot updated.  We have collected EKG, which demonstrated no acute ischemic changes.  We have also collected labs in anticipation of surgery.  Will request preop clearance form for [facility] as we do not have this on file here in the office  I have independently evaluated patient.  Kristina Mclaughlin is a 73 y.o. female who is low risk for an intermediate risk surgery.  There  are not modifiable risk factors (weight loss).  We discussed strategies to reduce weight including lifestyle modification.  She is looking into GIP and GLP therapy as well.  However, this would certainly need to be held for at least 2 weeks prior to surgery should she decide to proceed with that type of treatment. Kristina Mclaughlin's RCRI calculation for MACE is: 0.    Kristina Mclaughlin M. Kristina Counts, DO [facility]

## 2023-11-19 LAB — CMP14+EGFR
ALT: 30 IU/L (ref 0–32)
AST: 22 IU/L (ref 0–40)
Albumin: 4.2 g/dL (ref 3.9–4.9)
Alkaline Phosphatase: 120 IU/L (ref 44–121)
BUN/Creatinine Ratio: 24 (ref 12–28)
BUN: 20 mg/dL (ref 8–27)
Bilirubin Total: 0.3 mg/dL (ref 0.0–1.2)
CO2: 23 mmol/L (ref 20–29)
Calcium: 9.8 mg/dL (ref 8.7–10.3)
Chloride: 100 mmol/L (ref 96–106)
Creatinine, Ser: 0.85 mg/dL (ref 0.57–1.00)
Globulin, Total: 2.3 g/dL (ref 1.5–4.5)
Glucose: 111 mg/dL — ABNORMAL HIGH (ref 70–99)
Potassium: 4.1 mmol/L (ref 3.5–5.2)
Sodium: 138 mmol/L (ref 134–144)
Total Protein: 6.5 g/dL (ref 6.0–8.5)
eGFR: 74 mL/min/{1.73_m2} (ref >59)

## 2023-11-19 LAB — CBC
Hematocrit: 43.3 % (ref 34.0–46.6)
Hemoglobin: 14.9 g/dL (ref 11.1–15.9)
MCH: 29.7 pg (ref 26.6–33.0)
MCHC: 34.4 g/dL (ref 31.5–35.7)
MCV: 86 fL (ref 79–97)
Platelets: 218 10*3/uL (ref 150–450)
RBC: 5.02 x10E6/uL (ref 3.77–5.28)
RDW: 13 % (ref 11.7–15.4)
WBC: 7.6 10*3/uL (ref 3.4–10.8)

## 2023-11-24 ENCOUNTER — Telehealth: Payer: Self-pay | Admitting: Family Medicine

## 2023-11-24 DIAGNOSIS — Z0279 Encounter for issue of other medical certificate: Secondary | ICD-10-CM

## 2023-11-24 NOTE — Telephone Encounter (Signed)
 pt dropped off handicap forms to be completed and signed.  Form Fee Paid? (Y/N)       YES     If NO, form is placed on front office manager desk to hold until payment received. If YES, then form will be placed in the RX/HH Nurse Coordinators box for completion.  Form will not be processed until payment is received

## 2023-11-25 NOTE — Telephone Encounter (Signed)
 [facility] handicap form is ready

## 2023-12-03 DIAGNOSIS — J301 Allergic rhinitis due to pollen: Secondary | ICD-10-CM | POA: Diagnosis not present

## 2023-12-03 DIAGNOSIS — J3089 Other allergic rhinitis: Secondary | ICD-10-CM | POA: Diagnosis not present

## 2023-12-03 DIAGNOSIS — J3081 Allergic rhinitis due to animal (cat) (dog) hair and dander: Secondary | ICD-10-CM | POA: Diagnosis not present

## 2023-12-14 DIAGNOSIS — M1712 Unilateral primary osteoarthritis, left knee: Secondary | ICD-10-CM | POA: Diagnosis not present

## 2023-12-24 ENCOUNTER — Telehealth: Payer: Self-pay | Admitting: Family Medicine

## 2024-01-01 DIAGNOSIS — J3081 Allergic rhinitis due to animal (cat) (dog) hair and dander: Secondary | ICD-10-CM | POA: Diagnosis not present

## 2024-01-01 DIAGNOSIS — J3089 Other allergic rhinitis: Secondary | ICD-10-CM | POA: Diagnosis not present

## 2024-01-01 DIAGNOSIS — J301 Allergic rhinitis due to pollen: Secondary | ICD-10-CM | POA: Diagnosis not present

## 2024-01-12 DIAGNOSIS — G8918 Other acute postprocedural pain: Secondary | ICD-10-CM | POA: Diagnosis not present

## 2024-01-12 DIAGNOSIS — M25762 Osteophyte, left knee: Secondary | ICD-10-CM | POA: Diagnosis not present

## 2024-01-12 DIAGNOSIS — M1712 Unilateral primary osteoarthritis, left knee: Secondary | ICD-10-CM | POA: Diagnosis not present

## 2024-01-12 HISTORY — PX: REPLACEMENT TOTAL KNEE: SUR1224

## 2024-01-15 ENCOUNTER — Ambulatory Visit: Attending: Orthopedic Surgery | Admitting: Physical Therapy

## 2024-01-15 ENCOUNTER — Other Ambulatory Visit: Payer: Self-pay

## 2024-01-15 ENCOUNTER — Encounter: Payer: Self-pay | Admitting: Physical Therapy

## 2024-01-15 DIAGNOSIS — R600 Localized edema: Secondary | ICD-10-CM | POA: Diagnosis not present

## 2024-01-15 DIAGNOSIS — M25562 Pain in left knee: Secondary | ICD-10-CM | POA: Insufficient documentation

## 2024-01-15 DIAGNOSIS — G8929 Other chronic pain: Secondary | ICD-10-CM | POA: Diagnosis not present

## 2024-01-15 DIAGNOSIS — M6281 Muscle weakness (generalized): Secondary | ICD-10-CM | POA: Insufficient documentation

## 2024-01-15 DIAGNOSIS — M25662 Stiffness of left knee, not elsewhere classified: Secondary | ICD-10-CM | POA: Insufficient documentation

## 2024-01-15 NOTE — Therapy (Signed)
 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, lateral back   Depression    Essential hypertension 10/29/2015   GERD (gastroesophageal reflux disease)    Hypertension    Insomnia    Migraine headache    Tear of medial meniscus of knee 09/06/2018   Past Surgical History:  Procedure Laterality Date   ABDOMINAL HYSTERECTOMY     BACK SURGERY     BREAST SURGERY     biopsy x2; reduction   EXAMINATION UNDER ANESTHESIA  08/27/2012   LIPOMA EXCISION Right 07/03/2021   Procedure: EXCISION LIPOMA; ANKLE;  Surgeon: Awilda Bogus, MD;  Location: [facility];  Service: General;  Laterality: Right;   LIPOMA EXCISION N/A 07/03/2021   Procedure: EXCISION LIPOMA; SUPRAPUBIC;  Surgeon: Awilda Bogus, MD;  Location: [facility];  Service: General;  Laterality: N/A;   MASS EXCISION Left 07/03/2021   Procedure: EXCISION LIPOMA; ANKLE;  Surgeon: Awilda Bogus, MD;  Location: [facility];  Service: General;  Laterality: Left;   MASS EXCISION Left 07/03/2021   Procedure: EXCISION OF LIPOMA; ARM;  Surgeon: Awilda Bogus, MD;  Location: [facility];  Service: General;  Laterality: Left;   NECK SURGERY     Mass removed   REDUCTION MAMMAPLASTY Bilateral 2002   XI ROBOTIC ASSISTED HIATAL HERNIA REPAIR N/A 11/18/2022   Procedure: ROBOTIC HIATAL HERNIA REPAIR;  Surgeon: Junie Olds, MD;  Location: [facility];  Service:  General;  Laterality: N/A;   Patient Active Problem List   Diagnosis Date Noted   Non-recurrent acute serous otitis media of left ear 05/11/2023   Hiatal hernia 11/18/2022   Gastroesophageal reflux disease 02/10/2022   Chronic superficial gastritis without bleeding 02/10/2022   Fatty liver 02/10/2022   Allergic rhinitis due to animal (cat) (dog) hair and dander 01/20/2022   Cough variant asthma 01/20/2022   Chronic constipation 09/18/2021   Neuropathic pain 08/22/2021   Lipoma of foot 06/11/2021   Lipoma of upper arm 06/11/2021   Sensorineural hearing loss (SNHL) of left ear with unrestricted hearing of right ear 12/11/2020   Lipoma of abdominal wall 12/15/2019   Breast lipoma 12/15/2019   Lipoma of both lower extremities 12/15/2019   Primary insomnia 05/11/2018   Unilateral primary osteoarthritis, left knee 05/11/2018   Hemorrhoids 01/22/2018   Essential hypertension 10/29/2015   Upper airway cough syndrome 10/20/2014    REFERRING PROVIDER: Liliane Rei MD  REFERRING DIAG: Left total knee replacement  THERAPY DIAG:  Chronic pain of left knee  Localized edema  Stiffness of left knee, not elsewhere classified  Muscle weakness (generalized)  Rationale for Evaluation and Treatment: Rehabilitation  ONSET DATE: Surgery date (01/12/24).  SUBJECTIVE:   SUBJECTIVE STATEMENT: The patient presents to the clinic s/p left total knee replacement performed on 01/12/24.  She did not take her pain medication prior to PT today and reports a pain-level of 7-8/10.  She has a Zero Knee at home and has been using it.  We discussed her HEP and she states she has not been able to perform them yet but plans on having her husband [name] help her with them. Her Aquacel is intact and she is walking safely with a FWW.    PERTINENT HISTORY: See above.   PAIN:  Are you having pain? Yes: NPRS scale: 7-8/10. Pain location: Left knee. Pain description: Sharp. Aggravating factors:  Movement. Relieving factors: Not much.  PRECAUTIONS: Other: No ultrasound.    WEIGHT BEARING RESTRICTIONS: No  FALLS:  Has patient fallen in last 6 months? No  LIVING ENVIRONMENT: Lives with: lives with their spouse Lives in: House/apartment Stairs: 4-6.  Non-reciprocating.   Has following equipment at home: Otho Blitz - 2 wheeled  PLOF: Independent  PATIENT GOALS: Perform ADL's without knee pain.    OBJECTIVE:   PATIENT SURVEYS:  LEFS: 13/50.   EDEMA:  Circumferential: 9 cms > on left than right.    PALPATION: C/o diffuse left knee pain.    LOWER EXTREMITY ROM:  In supine:  Left knee extension is -20 degrees and flexion assessed in the seated position to 85 degrees.  LOWER EXTREMITY MMT:  Patient currently unable to to perform a left antigravity SAQ and SLR.  GAIT: Step-to gait pattern with a FWW.                                                                                                                              TREATMENT DATE: Nustep on level 1 moving seated forward x 1 to increase knee flexion x 8 minutes f/b LE elevation and vasopneumatic on low to patient's left knee x 15 minutes.      PATIENT EDUCATION:  Education details: Discussed exercise progression and importance of compliance to her HEP and managing pain by icing an dtaking medication as instructed by MD. Person educated: Patient and Spouse Education method: Medical illustrator Education comprehension: verbalized understanding  HOME EXERCISE PROGRAM:   ASSESSMENT:  CLINICAL IMPRESSION: The patient presents to [facility] s/p left total knee replacement performed on 01/12/24.  She is in a lot of pain today.  She is walking safely with a FWW with a step-to gait pattern.  He is unable to perform an antigravity SAQ and SLR currently.  She is a significant amount of edema.  She lacks 20 degrees of extension.  She has a Zero Knee at home.  We discussed the utmost importance of performing her  HEP.  She plans to have her husband assist her.  Her LEFS score is 13/50.  Patient will benefit from skilled physical therapy intervention to address pain and deficits.  OBJECTIVE IMPAIRMENTS: Abnormal gait, decreased activity tolerance, difficulty walking, decreased ROM, decreased strength, increased edema, and pain.   ACTIVITY LIMITATIONS: carrying, lifting, bending, standing, sleeping, stairs, transfers,  and locomotion level  PARTICIPATION LIMITATIONS: meal prep, cleaning, laundry, driving, shopping, community activity, and yard work  PERSONAL FACTORS: Time since onset of injury/illness/exacerbation are also affecting patient's functional outcome.   REHAB POTENTIAL: Good  CLINICAL DECISION MAKING: Stable/uncomplicated  EVALUATION COMPLEXITY: Low   GOALS:  SHORT TERM GOALS: Target date: 01/29/24:  Ind with an initial HEP. Goal status: INITIAL  2.  Full active left knee extension.  Goal status: INITIAL  LONG TERM GOALS: Target date: 02/26/24.  Ind with an advanced HEP.  Goal status: INITIAL  2.  Active left knee flexion to 115 degrees+ so the patient can perform functional tasks and do so with pain not > 2-3/10.  Goal status: INITIAL  3.  Increase left hip and knee strength to a solid 4+/5 to provide good stability for accomplishment of functional activities.  Goal status: INITIAL  4.  Decrease edema to within 3 cms of non-affected side to assist with pain reduction and range of motion gains.  Goal status: INITIAL  5.  Perform a reciprocating stair gait with one railing with pain not > 2-3/10.  Goal status: INITIAL  6.  Improve LEFS score to 49-50 points. Baseline:  Goal status: INITIAL   PLAN:  PT FREQUENCY: 2x/week  PT DURATION: 6 weeks  PLANNED INTERVENTIONS: 97110-Therapeutic exercises, 97530- Therapeutic activity, [id number]- Neuromuscular re-education, 97535- Self Care, 82956- Manual therapy, G0283- Electrical stimulation (unattended), 97016- Vasopneumatic  device, Patient/Family education, Stair training, and Cryotherapy  PLAN FOR NEXT SESSION: Nustep, PROM.  Progress per TKA protocol.  LE elevation and vasopneumatic.     Rajvi Armentor, [place], PT 01/15/2024, 2:43 PM

## 2024-01-18 ENCOUNTER — Encounter: Admitting: Physical Therapy

## 2024-01-18 ENCOUNTER — Ambulatory Visit: Admitting: Physical Therapy

## 2024-01-18 DIAGNOSIS — G8929 Other chronic pain: Secondary | ICD-10-CM

## 2024-01-18 DIAGNOSIS — M25662 Stiffness of left knee, not elsewhere classified: Secondary | ICD-10-CM | POA: Diagnosis not present

## 2024-01-18 DIAGNOSIS — R600 Localized edema: Secondary | ICD-10-CM | POA: Diagnosis not present

## 2024-01-18 DIAGNOSIS — M6281 Muscle weakness (generalized): Secondary | ICD-10-CM

## 2024-01-18 DIAGNOSIS — M25562 Pain in left knee: Secondary | ICD-10-CM | POA: Diagnosis not present

## 2024-01-18 NOTE — Therapy (Signed)
 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, lateral back   Depression    Essential hypertension 10/29/2015   GERD (gastroesophageal reflux disease)    Hypertension    Insomnia    Migraine headache    Tear of medial meniscus of knee 09/06/2018   Past Surgical History:  Procedure Laterality Date   ABDOMINAL HYSTERECTOMY     BACK SURGERY     BREAST SURGERY     biopsy x2; reduction   EXAMINATION UNDER ANESTHESIA  08/27/2012   LIPOMA EXCISION Right 07/03/2021   Procedure: EXCISION LIPOMA; ANKLE;  Surgeon: Awilda Bogus, MD;  Location: [facility];  Service: General;  Laterality: Right;   LIPOMA EXCISION N/A 07/03/2021   Procedure: EXCISION LIPOMA; SUPRAPUBIC;  Surgeon: Awilda Bogus, MD;  Location: [facility];  Service: General;  Laterality: N/A;   MASS EXCISION Left 07/03/2021   Procedure: EXCISION LIPOMA; ANKLE;  Surgeon: Awilda Bogus, MD;  Location: [facility];  Service: General;  Laterality: Left;   MASS EXCISION Left 07/03/2021   Procedure: EXCISION OF LIPOMA; ARM;  Surgeon: Awilda Bogus, MD;  Location: [facility];  Service: General;  Laterality: Left;   NECK SURGERY     Mass removed   REDUCTION MAMMAPLASTY Bilateral 2002   XI ROBOTIC ASSISTED HIATAL HERNIA REPAIR N/A 11/18/2022   Procedure: ROBOTIC HIATAL HERNIA REPAIR;  Surgeon: Junie Olds, MD;  Location: [facility];  Service:  General;  Laterality: N/A;   Patient Active Problem List   Diagnosis Date Noted   Non-recurrent acute serous otitis media of left ear 05/11/2023   Hiatal hernia 11/18/2022   Gastroesophageal reflux disease 02/10/2022   Chronic superficial gastritis without bleeding 02/10/2022   Fatty liver 02/10/2022   Allergic rhinitis due to animal (cat) (dog) hair and dander 01/20/2022   Cough variant asthma 01/20/2022   Chronic constipation 09/18/2021   Neuropathic pain 08/22/2021   Lipoma of foot 06/11/2021   Lipoma of upper arm 06/11/2021   Sensorineural hearing loss (SNHL) of left ear with unrestricted hearing of right ear 12/11/2020   Lipoma of abdominal wall 12/15/2019   Breast lipoma 12/15/2019   Lipoma of both lower extremities 12/15/2019   Primary insomnia 05/11/2018   Unilateral primary osteoarthritis, left knee 05/11/2018   Hemorrhoids 01/22/2018   Essential hypertension 10/29/2015   Upper airway cough syndrome 10/20/2014    REFERRING PROVIDER: Liliane Rei MD  REFERRING DIAG: Left total knee replacement  THERAPY DIAG:  Chronic pain of left knee  Localized edema  Stiffness of left knee, not elsewhere classified  Muscle weakness (generalized)  Rationale for Evaluation and Treatment: Rehabilitation  ONSET DATE: Surgery date (01/12/24).  SUBJECTIVE:   SUBJECTIVE STATEMENT: Took pain medication.  Pain about a 5-6/10.  PERTINENT HISTORY: See above.  PAIN:  Are you having pain? Yes: NPRS scale: 5-6/10. Pain location: Left knee. Pain description: Sharp. Aggravating factors: Movement. Relieving factors: Not much.  PRECAUTIONS: Other: No ultrasound.    WEIGHT BEARING RESTRICTIONS: No  FALLS:  Has patient fallen in last 6 months? No  LIVING ENVIRONMENT: Lives with: lives with their spouse Lives in: House/apartment Stairs: 4-6.  Non-reciprocating.   Has following equipment at home: Otho Blitz - 2 wheeled  PLOF: Independent  PATIENT GOALS: Perform ADL's without  knee pain.    OBJECTIVE:   PATIENT SURVEYS:  LEFS: 13/50.   EDEMA:  Circumferential: 9 cms > on left than right.    PALPATION: C/o diffuse left knee pain.    LOWER EXTREMITY ROM:  In supine:  Left knee extension is -20 degrees and flexion assessed in the seated position to 85 degrees.  LOWER EXTREMITY MMT:  Patient currently unable to to perform a left antigravity SAQ and SLR.  GAIT: Step-to gait pattern with a FWW.                                                                                                                              TREATMENT DATE:   01/18/24:  Nustep level 1 starting at seat 13 and progressing to seat 11 over 15 minutes f/b gentle low load long duration stretching into extension (4 minutes) and pain into flexion (5 minutes).  Assisted non-resisted left SAQ's x 3 minutes f/b  LE elevation and vasopneumatic on low to patient's left knee x 20 minutes.     01/15/24:  Nustep on level 1 moving seated forward x 1 to increase knee flexion x 8 minutes f/b LE elevation and vasopneumatic on low to patient's left knee x 15 minutes.      PATIENT EDUCATION:  Education details: Discussed exercise progression and importance of compliance to her HEP and managing pain by icing an dtaking medication as instructed by MD. Person educated: Patient and Spouse Education method: Medical illustrator Education comprehension: verbalized understanding  HOME EXERCISE PROGRAM:   ASSESSMENT:  CLINICAL IMPRESSION: Patient doing better today.  Encouraged patient to be compliant to her HEP which includes the Zero Knee to improve extension.  She demonstrated improved left quad activation today.    OBJECTIVE IMPAIRMENTS: Abnormal gait, decreased activity tolerance, difficulty walking, decreased ROM, decreased strength, increased edema, and pain.   ACTIVITY LIMITATIONS: carrying, lifting, bending, standing, sleeping, stairs, transfers, and locomotion  level  PARTICIPATION LIMITATIONS: meal prep, cleaning, laundry, driving, shopping, community activity, and yard work  PERSONAL FACTORS: Time since onset of injury/illness/exacerbation are also affecting patient's functional outcome.   REHAB POTENTIAL: Good  CLINICAL DECISION MAKING: Stable/uncomplicated  EVALUATION COMPLEXITY: Low   GOALS:  SHORT TERM GOALS: Target date: 01/29/24:  Ind with an initial HEP. Goal status: INITIAL  2.  Full active left knee extension.  Goal status: INITIAL  LONG TERM GOALS: Target date: 02/26/24.  Ind with an advanced HEP.  Goal status:  INITIAL  2.  Active left knee flexion to 115 degrees+ so the patient can perform functional tasks and do so with pain not > 2-3/10.  Goal status: INITIAL  3.  Increase left hip and knee strength to a solid 4+/5 to provide good stability for accomplishment of functional activities.  Goal status: INITIAL  4.  Decrease edema to within 3 cms of non-affected side to assist with pain reduction and range of motion gains.  Goal status: INITIAL  5.  Perform a reciprocating stair gait with one railing with pain not > 2-3/10.  Goal status: INITIAL  6.  Improve LEFS score to 49-50 points. Baseline:  Goal status: INITIAL   PLAN:  PT FREQUENCY: 2x/week  PT DURATION: 6 weeks  PLANNED INTERVENTIONS: 97110-Therapeutic exercises, 97530- Therapeutic activity, [id number]- Neuromuscular re-education, 97535- Self Care, 16109- Manual therapy, G0283- Electrical stimulation (unattended), 97016- Vasopneumatic device, Patient/Family education, Stair training, and Cryotherapy  PLAN FOR NEXT SESSION: Nustep, PROM.  Progress per TKA protocol.  LE elevation and vasopneumatic.     Icelynn Onken, [place], PT 01/18/2024, 4:57 PM

## 2024-01-20 ENCOUNTER — Encounter: Admitting: *Deleted

## 2024-01-21 ENCOUNTER — Ambulatory Visit: Admitting: *Deleted

## 2024-01-21 ENCOUNTER — Encounter: Payer: Self-pay | Admitting: *Deleted

## 2024-01-21 DIAGNOSIS — M6281 Muscle weakness (generalized): Secondary | ICD-10-CM | POA: Diagnosis not present

## 2024-01-21 DIAGNOSIS — R600 Localized edema: Secondary | ICD-10-CM

## 2024-01-21 DIAGNOSIS — M25662 Stiffness of left knee, not elsewhere classified: Secondary | ICD-10-CM | POA: Diagnosis not present

## 2024-01-21 DIAGNOSIS — G8929 Other chronic pain: Secondary | ICD-10-CM | POA: Diagnosis not present

## 2024-01-21 DIAGNOSIS — M25562 Pain in left knee: Secondary | ICD-10-CM | POA: Diagnosis not present

## 2024-01-21 NOTE — Therapy (Signed)
 OUTPATIENT PHYSICAL THERAPY LOWER EXTREMITY TREATMENT   Patient Name: Kristina Mclaughlin MRN: [id number] DOB:[birth year], 73 y.o., female Today's Date: 01/21/2024  END OF SESSION:  PT End of Session - 01/21/24 1110     Visit Number 3    Number of Visits 12    Date for PT Re-Evaluation 02/26/24    PT Start Time 1100    PT Stop Time 1155    PT Time Calculation (min) 55 min          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, lateral back   Depression    Essential hypertension 10/29/2015   GERD (gastroesophageal reflux disease)    Hypertension    Insomnia    Migraine headache    Tear of medial meniscus of knee 09/06/2018   Past Surgical History:  Procedure Laterality Date   ABDOMINAL HYSTERECTOMY     BACK SURGERY     BREAST SURGERY     biopsy x2; reduction   EXAMINATION UNDER ANESTHESIA  08/27/2012   LIPOMA EXCISION Right 07/03/2021   Procedure: EXCISION LIPOMA; ANKLE;  Surgeon: Awilda Bogus, MD;  Location: [facility];  Service: General;  Laterality: Right;   LIPOMA EXCISION N/A 07/03/2021   Procedure: EXCISION LIPOMA; SUPRAPUBIC;  Surgeon: Awilda Bogus, MD;  Location: [facility];  Service: General;  Laterality: N/A;   MASS EXCISION Left 07/03/2021   Procedure: EXCISION LIPOMA; ANKLE;  Surgeon: Awilda Bogus, MD;  Location: [facility];  Service: General;  Laterality: Left;   MASS EXCISION Left 07/03/2021   Procedure: EXCISION OF LIPOMA; ARM;  Surgeon: Awilda Bogus, MD;  Location: [facility];  Service: General;  Laterality: Left;   NECK SURGERY     Mass removed   REDUCTION MAMMAPLASTY Bilateral 2002   XI ROBOTIC ASSISTED HIATAL HERNIA REPAIR N/A 11/18/2022   Procedure: ROBOTIC HIATAL HERNIA REPAIR;  Surgeon: Junie Olds, MD;  Location: [facility];  Service: General;  Laterality: N/A;   Patient Active Problem List   Diagnosis Date Noted   Non-recurrent acute serous otitis  media of left ear 05/11/2023   Hiatal hernia 11/18/2022   Gastroesophageal reflux disease 02/10/2022   Chronic superficial gastritis without bleeding 02/10/2022   Fatty liver 02/10/2022   Allergic rhinitis due to animal (cat) (dog) hair and dander 01/20/2022   Cough variant asthma 01/20/2022   Chronic constipation 09/18/2021   Neuropathic pain 08/22/2021   Lipoma of foot 06/11/2021   Lipoma of upper arm 06/11/2021   Sensorineural hearing loss (SNHL) of left ear with unrestricted hearing of right ear 12/11/2020   Lipoma of abdominal wall 12/15/2019   Breast lipoma 12/15/2019   Lipoma of both lower extremities 12/15/2019   Primary insomnia 05/11/2018   Unilateral primary osteoarthritis, left knee 05/11/2018   Hemorrhoids 01/22/2018   Essential hypertension 10/29/2015   Upper airway cough syndrome 10/20/2014    REFERRING PROVIDER: Liliane Rei MD  REFERRING DIAG: Left total knee replacement  THERAPY DIAG:  Chronic pain of left knee  Localized edema  Stiffness of left knee, not elsewhere classified  Muscle weakness (generalized)  Rationale for Evaluation and Treatment: Rehabilitation  ONSET DATE: Surgery date (01/12/24).  SUBJECTIVE:   SUBJECTIVE STATEMENT:  Pain about a 5-6/10.   LT knee sore from last visit  PERTINENT HISTORY: See above.   PAIN:  Are you having pain? Yes: NPRS scale: 5-6/10. Pain location: Left  knee. Pain description: Sharp. Aggravating factors: Movement. Relieving factors: Not much.  PRECAUTIONS: Other: No ultrasound.    WEIGHT BEARING RESTRICTIONS: No  FALLS:  Has patient fallen in last 6 months? No  LIVING ENVIRONMENT: Lives with: lives with their spouse Lives in: House/apartment Stairs: 4-6.  Non-reciprocating.   Has following equipment at home: Otho Blitz - 2 wheeled  PLOF: Independent  PATIENT GOALS: Perform ADL's without knee pain.    OBJECTIVE:   PATIENT SURVEYS:  LEFS: 13/50.   EDEMA:  Circumferential: 9 cms > on left  than right.    PALPATION: C/o diffuse left knee pain.    LOWER EXTREMITY ROM:  In supine:  Left knee extension is -20 degrees and flexion assessed in the seated position to 85 degrees.  LOWER EXTREMITY MMT:  Patient currently unable to to perform a left antigravity SAQ and SLR.  GAIT: Step-to gait pattern with a FWW.                                                                                                                              TREATMENT DATE:    LT knee  01/21/24:  Nustep level 2 starting at seat 13 and progressing to seat 11 over 15 minutes  LAQs x 20 Rocker board x 4 mins Manual STW to LT quad as well as HS's and posterior aspect.gentle low load long duration stretching into extension     LE elevation and vasopneumatic on low to patient's left knee x 15 minutes.     01/15/24:  Nustep on level 1 moving seated forward x 1 to increase knee flexion x 8 minutes f/b LE elevation and vasopneumatic on low to patient's left knee x 15 minutes.      PATIENT EDUCATION:  Education details: Discussed exercise progression and importance of compliance to her HEP and managing pain by icing an dtaking medication as instructed by MD. Person educated: Patient and Spouse Education method: Medical illustrator Education comprehension: verbalized understanding  HOME EXERCISE PROGRAM:   ASSESSMENT:  CLINICAL IMPRESSION: Patient arrived today ambulating with FWW and doing some better. She was able to tolerate Nustep progressions better today as well as introduction to rocker board. Her cc was posterior knee pain with extension stretches and states she may have a bakers cyst.Rx focused on ROM progression and quad activation. Vaso end of session    OBJECTIVE IMPAIRMENTS: Abnormal gait, decreased activity tolerance, difficulty walking, decreased ROM, decreased strength, increased edema, and pain.   ACTIVITY LIMITATIONS: carrying, lifting, bending, standing, sleeping, stairs,  transfers, and locomotion level  PARTICIPATION LIMITATIONS: meal prep, cleaning, laundry, driving, shopping, community activity, and yard work  PERSONAL FACTORS: Time since onset of injury/illness/exacerbation are also affecting patient's functional outcome.   REHAB POTENTIAL: Good  CLINICAL DECISION MAKING: Stable/uncomplicated  EVALUATION COMPLEXITY: Low   GOALS:  SHORT TERM GOALS: Target date: 01/29/24:  Ind with an initial HEP. Goal status: INITIAL  2.  Full active left knee  extension.  Goal status: INITIAL  LONG TERM GOALS: Target date: 02/26/24.  Ind with an advanced HEP.  Goal status: INITIAL  2.  Active left knee flexion to 115 degrees+ so the patient can perform functional tasks and do so with pain not > 2-3/10.  Goal status: INITIAL  3.  Increase left hip and knee strength to a solid 4+/5 to provide good stability for accomplishment of functional activities.  Goal status: INITIAL  4.  Decrease edema to within 3 cms of non-affected side to assist with pain reduction and range of motion gains.  Goal status: INITIAL  5.  Perform a reciprocating stair gait with one railing with pain not > 2-3/10.  Goal status: INITIAL  6.  Improve LEFS score to 49-50 points. Baseline:  Goal status: INITIAL   PLAN:  PT FREQUENCY: 2x/week  PT DURATION: 6 weeks  PLANNED INTERVENTIONS: 97110-Therapeutic exercises, 97530- Therapeutic activity, [id number]- Neuromuscular re-education, 97535- Self Care, 87564- Manual therapy, G0283- Electrical stimulation (unattended), 97016- Vasopneumatic device, Patient/Family education, Stair training, and Cryotherapy  PLAN FOR NEXT SESSION: Nustep, PROM.  Progress per TKA protocol.  LE elevation and vasopneumatic.     Izick Gasbarro,CHRIS, PTA 01/21/2024, 3:56 PM

## 2024-01-25 ENCOUNTER — Ambulatory Visit: Admitting: *Deleted

## 2024-01-25 ENCOUNTER — Encounter: Payer: Self-pay | Admitting: *Deleted

## 2024-01-25 DIAGNOSIS — G8929 Other chronic pain: Secondary | ICD-10-CM

## 2024-01-25 DIAGNOSIS — M6281 Muscle weakness (generalized): Secondary | ICD-10-CM | POA: Diagnosis not present

## 2024-01-25 DIAGNOSIS — R600 Localized edema: Secondary | ICD-10-CM

## 2024-01-25 DIAGNOSIS — M25562 Pain in left knee: Secondary | ICD-10-CM | POA: Diagnosis not present

## 2024-01-25 DIAGNOSIS — M25662 Stiffness of left knee, not elsewhere classified: Secondary | ICD-10-CM

## 2024-01-25 NOTE — Therapy (Signed)
 OUTPATIENT PHYSICAL THERAPY LOWER EXTREMITY TREATMENT   Patient Name: Kristina Mclaughlin MRN: [id number] DOB:Nov 04, 1950, 73 y.o., female Today's Date: 01/25/2024  END OF SESSION:  PT End of Session - 01/25/24 1109     Visit Number 4    Number of Visits 12    Date for PT Re-Evaluation 02/26/24    PT Start Time 1100    PT Stop Time 1155    PT Time Calculation (min) 55 min          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, lateral back   Depression    Essential hypertension 10/29/2015   GERD (gastroesophageal reflux disease)    Hypertension    Insomnia    Migraine headache    Tear of medial meniscus of knee 09/06/2018   Past Surgical History:  Procedure Laterality Date   ABDOMINAL HYSTERECTOMY     BACK SURGERY     BREAST SURGERY     biopsy x2; reduction   EXAMINATION UNDER ANESTHESIA  08/27/2012   LIPOMA EXCISION Right 07/03/2021   Procedure: EXCISION LIPOMA; ANKLE;  Surgeon: Kallie Manuelita BROCKS, MD;  Location: [facility];  Service: General;  Laterality: Right;   LIPOMA EXCISION N/A 07/03/2021   Procedure: EXCISION LIPOMA; SUPRAPUBIC;  Surgeon: Kallie Manuelita BROCKS, MD;  Location: [facility];  Service: General;  Laterality: N/A;   MASS EXCISION Left 07/03/2021   Procedure: EXCISION LIPOMA; ANKLE;  Surgeon: Kallie Manuelita BROCKS, MD;  Location: [facility];  Service: General;  Laterality: Left;   MASS EXCISION Left 07/03/2021   Procedure: EXCISION OF LIPOMA; ARM;  Surgeon: Kallie Manuelita BROCKS, MD;  Location: [facility];  Service: General;  Laterality: Left;   NECK SURGERY     Mass removed   REDUCTION MAMMAPLASTY Bilateral 2002   XI ROBOTIC ASSISTED HIATAL HERNIA REPAIR N/A 11/18/2022   Procedure: ROBOTIC HIATAL HERNIA REPAIR;  Surgeon: Lyndel Deward PARAS, MD;  Location: [facility];  Service: General;  Laterality: N/A;   Patient Active Problem List   Diagnosis Date Noted   Non-recurrent acute serous otitis  media of left ear 05/11/2023   Hiatal hernia 11/18/2022   Gastroesophageal reflux disease 02/10/2022   Chronic superficial gastritis without bleeding 02/10/2022   Fatty liver 02/10/2022   Allergic rhinitis due to animal (cat) (dog) hair and dander 01/20/2022   Cough variant asthma 01/20/2022   Chronic constipation 09/18/2021   Neuropathic pain 08/22/2021   Lipoma of foot 06/11/2021   Lipoma of upper arm 06/11/2021   Sensorineural hearing loss (SNHL) of left ear with unrestricted hearing of right ear 12/11/2020   Lipoma of abdominal wall 12/15/2019   Breast lipoma 12/15/2019   Lipoma of both lower extremities 12/15/2019   Primary insomnia 05/11/2018   Unilateral primary osteoarthritis, left knee 05/11/2018   Hemorrhoids 01/22/2018   Essential hypertension 10/29/2015   Upper airway cough syndrome 10/20/2014    REFERRING PROVIDER: Dempsey Moan MD  REFERRING DIAG: Left total knee replacement  THERAPY DIAG:  Chronic pain of left knee  Localized edema  Stiffness of left knee, not elsewhere classified  Rationale for Evaluation and Treatment: Rehabilitation  ONSET DATE: Surgery date (01/12/24).  SUBJECTIVE:   SUBJECTIVE STATEMENT:  Pain about a 5-6/10.   LT knee sore from last visit, but doing better. To MD Wednesday  PERTINENT HISTORY: See above.   PAIN:  Are you having pain? Yes: NPRS scale: 5-6/10. Pain  location: Left knee. Pain description: Sharp. Aggravating factors: Movement. Relieving factors: Not much.  PRECAUTIONS: Other: No ultrasound.    WEIGHT BEARING RESTRICTIONS: No  FALLS:  Has patient fallen in last 6 months? No  LIVING ENVIRONMENT: Lives with: lives with their spouse Lives in: House/apartment Stairs: 4-6.  Non-reciprocating.   Has following equipment at home: Vannie - 2 wheeled  PLOF: Independent  PATIENT GOALS: Perform ADL's without knee pain.    OBJECTIVE:   PATIENT SURVEYS:  LEFS: 13/50.   EDEMA:  Circumferential: 9 cms > on  left than right.    PALPATION: C/o diffuse left knee pain.    LOWER EXTREMITY ROM:  In supine:  Left knee extension is -20 degrees and flexion assessed in the seated position to 85 degrees.  LOWER EXTREMITY MMT:  Patient currently unable to to perform a left antigravity SAQ and SLR.  GAIT: Step-to gait pattern with a FWW.                                                                                                                              TREATMENT DATE:    LT knee  01/25/24:  Nustep level 2 starting at seat 10 and progressing to seat 9 x 16 minutes  LAQs x 10, 2# x 15 Rocker board x 4 mins Manual STW to LT quad as well as HS's and posterior aspect.gentle low load long duration stretching into extension, but very sensitive posterior aspect today   LE elevation and vasopneumatic on low to patient's left knee x 15 minutes.     01/15/24:  Nustep on level 1 moving seated forward x 1 to increase knee flexion x 8 minutes f/b LE elevation and vasopneumatic on low to patient's left knee x 15 minutes.      PATIENT EDUCATION:  Education details: Discussed exercise progression and importance of compliance to her HEP and managing pain by icing an dtaking medication as instructed by MD. Person educated: Patient and Spouse Education method: Medical illustrator Education comprehension: verbalized understanding  HOME EXERCISE PROGRAM:   ASSESSMENT:  CLINICAL IMPRESSION: Patient arrived today ambulating with FWW and doing some better. She was able Nustep progression to seat 9 today and did well with Exs, but continues to have posterior knee pain that hinders knee extension progressions.  Her cc was posterior knee pain with any extension stretching today and was hypersensitive.Rx focused on ROM progression and quad activation. Vaso end of session.    OBJECTIVE IMPAIRMENTS: Abnormal gait, decreased activity tolerance, difficulty walking, decreased ROM, decreased strength,  increased edema, and pain.   ACTIVITY LIMITATIONS: carrying, lifting, bending, standing, sleeping, stairs, transfers, and locomotion level  PARTICIPATION LIMITATIONS: meal prep, cleaning, laundry, driving, shopping, community activity, and yard work  PERSONAL FACTORS: Time since onset of injury/illness/exacerbation are also affecting patient's functional outcome.   REHAB POTENTIAL: Good  CLINICAL DECISION MAKING: Stable/uncomplicated  EVALUATION COMPLEXITY: Low   GOALS:  SHORT TERM GOALS: Target date:  01/29/24:  Ind with an initial HEP. Goal status: MET  2.  Full active left knee extension.  Goal status: INITIAL  LONG TERM GOALS: Target date: 02/26/24.  Ind with an advanced HEP.  Goal status: INITIAL  2.  Active left knee flexion to 115 degrees+ so the patient can perform functional tasks and do so with pain not > 2-3/10.  Goal status: INITIAL  3.  Increase left hip and knee strength to a solid 4+/5 to provide good stability for accomplishment of functional activities.  Goal status: INITIAL  4.  Decrease edema to within 3 cms of non-affected side to assist with pain reduction and range of motion gains.  Goal status: INITIAL  5.  Perform a reciprocating stair gait with one railing with pain not > 2-3/10.  Goal status: INITIAL  6.  Improve LEFS score to 49-50 points. Baseline:  Goal status: INITIAL   PLAN:  PT FREQUENCY: 2x/week  PT DURATION: 6 weeks  PLANNED INTERVENTIONS: 97110-Therapeutic exercises, 97530- Therapeutic activity, [id number]- Neuromuscular re-education, 97535- Self Care, [id number]- Manual therapy, G0283- Electrical stimulation (unattended), 97016- Vasopneumatic device, Patient/Family education, Stair training, and Cryotherapy  PLAN FOR NEXT SESSION: Nustep, PROM.  Progress per TKA protocol.  LE elevation and vasopneumatic.     Lemmie Vanlanen,CHRIS, PTA 01/25/2024, 11:55 AM

## 2024-01-26 ENCOUNTER — Ambulatory Visit (INDEPENDENT_AMBULATORY_CARE_PROVIDER_SITE_OTHER): Payer: Medicare HMO

## 2024-01-26 VITALS — BP 137/70 | HR 73 | Ht 69.0 in | Wt 242.0 lb

## 2024-01-26 DIAGNOSIS — Z0000 Encounter for general adult medical examination without abnormal findings: Secondary | ICD-10-CM | POA: Diagnosis not present

## 2024-01-26 NOTE — Patient Instructions (Addendum)
 Kristina Mclaughlin , Thank you for taking time out of your busy schedule to complete your Annual Wellness Visit with me. I enjoyed our conversation and look forward to speaking with you again next year. I, as well as your care team,  appreciate your ongoing commitment to your health goals. Please review the following plan we discussed and let me know if I can assist you in the future. Your Game plan/ To Do List     Follow up Visits: Next Medicare AWV with our clinical staff: 09/28/24 at 10:00a.m.   Next Office Visit with your provider: 05/12/24 at 9:00a.m.  Clinician Recommendations:  Aim for 30 minutes of exercise or brisk walking, 6-8 glasses of water, and 5 servings of fruits and vegetables each day. N/a      This is a list of the screening recommended for you and due dates:  Health Maintenance  Topic Date Due   Medicare Annual Wellness Visit  01/23/2024   COVID-19 Vaccine (3 - Pfizer risk series) 02/10/2025*   Flu Shot  03/04/2024   Mammogram  10/05/2024   DEXA scan (bone density measurement)  04/03/2027   Colon Cancer Screening  08/24/2033   DTaP/Tdap/Td vaccine (2 - Td or Tdap) 11/17/2033   Pneumococcal Vaccine for age over 34  Completed   Hepatitis C Screening  Completed   Zoster (Shingles) Vaccine  Completed   Hepatitis B Vaccine  Aged Out   HPV Vaccine  Aged Out   Meningitis B Vaccine  Aged Out  *Topic was postponed. The date shown is not the original due date.    Advanced directives: (Copy Requested) Please bring a copy of your health care power of attorney and living will to the office to be added to your chart at your convenience. You can mail to [facility] [address] [place], [place] [address] or email to [email or web address] Advance Care Planning is important because it:  [x]  Makes sure you receive the medical care that is consistent with your values, goals, and preferences  [x]  It provides guidance to your family and loved ones and reduces their  decisional burden about whether or not they are making the right decisions based on your wishes.  Follow the link provided in your after visit summary or read over the paperwork we have mailed to you to help you started getting your Advance Directives in place. If you need assistance in completing these, please reach out to us  so that we can help you!  See attachments for Preventive Care and Fall Prevention Tips.

## 2024-01-26 NOTE — Progress Notes (Signed)
 Subjective:   Kristina Mclaughlin is a 73 y.o. who presents for a Medicare Wellness preventive visit.  As a reminder, Annual Wellness Visits don't include a physical exam, and some assessments may be limited, especially if this visit is performed virtually. We may recommend an in-person follow-up visit with your provider if needed.  Visit Complete: Virtual I connected with  Abir Eroh Rehmann on 01/26/24 by a audio enabled telemedicine application and verified that I am speaking with the correct person using two identifiers.  Patient Location: Home  Provider Location: Home Office  I discussed the limitations of evaluation and management by telemedicine. The patient expressed understanding and agreed to proceed.  Vital Signs: Because this visit was a virtual/telehealth visit, some criteria may be missing or patient reported. Any vitals not documented were not able to be obtained and vitals that have been documented are patient reported.  VideoDeclined- This patient declined Librarian, academic. Therefore the visit was completed with audio only.  Persons Participating in Visit: Patient.  AWV Questionnaire: No: Patient Medicare AWV questionnaire was not completed prior to this visit.  Cardiac Risk Factors include: advanced age (>82men, >81 women);obesity (BMI >30kg/m2);hypertension     Objective:    Today's Vitals   01/26/24 1013 01/26/24 1117  BP: 137/70   Pulse: 73   Weight: 242 lb (109.8 kg)   Height: 5' 9 (1.753 m)   PainSc:  4    Body mass index is 35.74 kg/m.     01/26/2024   11:24 AM 01/15/2024    2:09 PM 04/14/2023    5:39 PM 01/23/2023    9:56 AM 11/18/2022   12:36 PM 11/14/2022   11:25 AM 01/21/2022   11:23 AM  Advanced Directives  Does Patient Have a Medical Advance Directive? Yes Yes No Yes Yes Yes Yes  Type of Advance Directive Living will   Healthcare Power of [place];Living will Healthcare Power of [place];Living will  Healthcare Power of [place];Living will Healthcare Power of [place];Living will  Does patient want to make changes to medical advance directive?     No - Patient declined    Copy of Healthcare Power of Attorney in Chart?    No - copy requested No - copy requested No - copy requested No - copy requested  Would patient like information on creating a medical advance directive?   No - Patient declined        Current Medications (verified) Outpatient Encounter Medications as of 01/26/2024  Medication Sig   acetaminophen  (TYLENOL ) 500 MG tablet Take 1,000 mg by mouth every 6 (six) hours as needed for moderate pain.   albuterol  (VENTOLIN  HFA) 108 (90 Base) MCG/ACT inhaler TAKE 2 PUFFS BY MOUTH EVERY 6 HOURS AS NEEDED FOR WHEEZE OR SHORTNESS OF BREATH   atorvastatin  (LIPITOR) 40 MG tablet Take 1 tablet (40 mg total) by mouth daily.   Azelastine  HCl 137 MCG/SPRAY SOLN Place 1 spray into both nostrils daily as needed (congestion).   benzonatate  (TESSALON ) 200 MG capsule Take 1 capsule (200 mg total) by mouth 3 (three) times daily as needed.   Biotin 10 MG CAPS Take 10 mg by mouth daily.   budesonide -formoterol  (SYMBICORT ) 80-4.5 MCG/ACT inhaler Take 2 puffs first thing in am and then another 2 puffs about 12 hours later.   Calcium  Carbonate (CALCIUM  500 PO) Take 500 mg by mouth daily.   Cholecalciferol (VITAMIN D3) 125 MCG (5000 UT) capsule Take 5,000 Units by mouth daily.   diclofenac  Sodium (VOLTAREN )  1 % GEL Apply 2 g topically 4 (four) times daily as needed (arthritis).   EPINEPHrine  0.3 mg/0.3 mL IJ SOAJ injection Inject 0.3 mg into the muscle as needed for anaphylaxis.   fexofenadine  (ALLEGRA ) 180 MG tablet Take 1 tablet (180 mg total) by mouth daily. (Patient taking differently: Take 180 mg by mouth at bedtime.)   fluticasone  (FLONASE ) 50 MCG/ACT nasal spray SPRAY 2 SPRAYS INTO EACH NOSTRIL EVERY DAY (Patient taking differently: Place 1 spray into both nostrils 2 (two) times daily.)   linaclotide   (LINZESS ) 290 MCG CAPS capsule Take 1 capsule (290 mcg total) by mouth daily before breakfast.   Melatonin 5 MG CAPS Take 5 mg by mouth at bedtime as needed (sleep).   methocarbamol  (ROBAXIN ) 500 MG tablet Take by mouth every 6 (six) hours as needed for muscle spasms.   Multiple Vitamin (MULTI-VITAMIN PO) Take 1 tablet by mouth daily.    naproxen  (NAPROSYN ) 500 MG tablet Take 1 tablet (500 mg total) by mouth 2 (two) times daily.   OXYCODONE  HCL PO Take 5 mg by mouth as needed (1-2 tabs every 6hrs for pain).   Polyethylene Glycol 400 (BLINK TEARS OP) Place 1 drop into both eyes daily.   Probiotic Product (PROBIOTIC PO) Take 1 capsule by mouth daily.   SUMAtriptan  (IMITREX ) 100 MG tablet TAKE 1 TABLET AS NEED FOR MIGRAINE. MAY REPEAT IN 2 HOURS IF HEADACHE PERSISTS OR RECURS.   traMADol (ULTRAM) 50 MG tablet Take 50 mg by mouth every 6 (six) hours as needed.   traZODone  (DESYREL ) 150 MG tablet Take 1 tablet (150 mg total) by mouth at bedtime as needed for sleep.   trospium  (SANCTURA ) 20 MG tablet Take 1 tablet (20 mg total) by mouth 2 (two) times daily.   Turmeric Curcumin 500 MG CAPS Take 500 mg by mouth daily.   valsartan -hydrochlorothiazide  (DIOVAN -HCT) 320-25 MG tablet Take 1 tablet by mouth daily.   No facility-administered encounter medications on file as of 01/26/2024.    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, lateral back   Depression    Essential hypertension 10/29/2015   GERD (gastroesophageal reflux disease)    Hypertension    Insomnia    Migraine headache    Tear of medial meniscus of knee 09/06/2018   Past Surgical History:  Procedure Laterality Date   ABDOMINAL HYSTERECTOMY     BACK SURGERY     BREAST SURGERY      biopsy x2; reduction   EXAMINATION UNDER ANESTHESIA  08/27/2012   LIPOMA EXCISION Right 07/03/2021   Procedure: EXCISION LIPOMA; ANKLE;  Surgeon: Kallie Manuelita BROCKS, MD;  Location: [facility];  Service: General;  Laterality: Right;   LIPOMA EXCISION N/A 07/03/2021   Procedure: EXCISION LIPOMA; SUPRAPUBIC;  Surgeon: Kallie Manuelita BROCKS, MD;  Location: [facility];  Service: General;  Laterality: N/A;   MASS EXCISION Left 07/03/2021   Procedure: EXCISION LIPOMA; ANKLE;  Surgeon: Kallie Manuelita BROCKS, MD;  Location: [facility];  Service: General;  Laterality: Left;   MASS EXCISION Left 07/03/2021   Procedure: EXCISION OF LIPOMA; ARM;  Surgeon: Kallie Manuelita BROCKS, MD;  Location: [facility];  Service: General;  Laterality: Left;   NECK SURGERY     Mass removed   REDUCTION MAMMAPLASTY Bilateral 2002   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  XI ROBOTIC ASSISTED HIATAL HERNIA REPAIR N/A 11/18/2022   Procedure: ROBOTIC HIATAL HERNIA REPAIR;  Surgeon: Lyndel Deward PARAS, MD;  Location: [facility];  Service: General;  Laterality: N/A;   Family History  Problem Relation Age of Onset   Bladder Cancer Mother    Allergies Mother    Heart disease Mother    Allergies Sister    Alzheimer's disease Father    Breast cancer Neg Hx    Social History   Socioeconomic History   Marital status: Married    Spouse name: Not on file   Number of children: 2   Years of education: Not on file   Highest education level: Not on file  Occupational History   Occupation: Purchasing   Tobacco Use   Smoking status: Former    Current packs/day: 0.00    Average packs/day: 0.3 packs/day for 2.0 years (0.5 ttl pk-yrs)    Types: Cigarettes    Start date: 52    Quit date: 1980    Years since quitting: 45.5   Smokeless tobacco: Never  Vaping Use   Vaping status: Never Used  Substance and Sexual Activity   Alcohol use: No    Alcohol/week: 0.0 standard drinks of alcohol   Drug use: No   Sexual activity: Not on file  Other Topics  Concern   Not on file  Social History Narrative   She is a retired Therapist, occupational.  She is married with 2 daughters and 4 grandchildren, all who live locally.  She tries to stay active.   Social Drivers of Corporate investment banker Strain: Low Risk  (01/26/2024)   Overall Financial Resource Strain (CARDIA)    Difficulty of Paying Living Expenses: Not hard at all  Food Insecurity: No Food Insecurity (01/26/2024)   Hunger Vital Sign    Worried About Running Out of Food in the Last Year: Never true    Ran Out of Food in the Last Year: Never true  Transportation Needs: No Transportation Needs (01/26/2024)   PRAPARE - Administrator, Civil Service (Medical): No    Lack of Transportation (Non-Medical): No  Physical Activity: Insufficiently Active (01/26/2024)   Exercise Vital Sign    Days of Exercise per Week: 7 days    Minutes of Exercise per Session: 20 min  Stress: No Stress Concern Present (01/26/2024)   [employer] of Occupational Health - Occupational Stress Questionnaire    Feeling of Stress: Not at all  Social Connections: Socially Integrated (01/23/2023)   Social Connection and Isolation Panel    Frequency of Communication with Friends and Family: More than three times a week    Frequency of Social Gatherings with Friends and Family: More than three times a week    Attends Religious Services: More than 4 times per year    Active Member of [employer] or Organizations: Yes    Attends Engineer, structural: More than 4 times per year    Marital Status: Married    Tobacco Counseling Counseling given: Yes    Clinical Intake:  Pre-visit preparation completed: Yes  Pain : 0-10 (knee pain) Pain Score: 4  Pain Type: Chronic pain (due to knee surgery) Pain Location: Knee Pain Orientation: Left Pain Descriptors / Indicators: Aching, Constant Pain Onset: 1 to 4 weeks ago Pain Frequency: Constant Pain Relieving Factors: pain meds  Pain  Relieving Factors: pain meds  BMI - recorded: 35.74 Nutritional Status: BMI > 30  Obese Nutritional Risks: None Diabetes:  No  Lab Results  Component Value Date   HGBA1C 6.4 (H) 11/18/2023   HGBA1C 6.2 (H) 04/13/2023   HGBA1C 6.1 (H) 11/14/2022     How often do you need to have someone help you when you read instructions, pamphlets, or other written materials from your doctor or pharmacy?: 1 - Never  Interpreter Needed?: No  Information entered by :: Alia t/cma   Activities of Daily Living     01/26/2024   11:22 AM  In your present state of health, do you have any difficulty performing the following activities:  Hearing? 1  Vision? 0  Difficulty concentrating or making decisions? 0  Walking or climbing stairs? 1  Comment due knee surgery  Dressing or bathing? 1  Doing errands, shopping? 0  Preparing Food and eating ? N  Using the Toilet? N  In the past six months, have you accidently leaked urine? Y  Do you have problems with loss of bowel control? N  Managing your Medications? N  Managing your Finances? N  Housekeeping or managing your Housekeeping? N    Patient Care Team: Jolinda Norene HERO, DO as PCP - General (Family Medicine) Livingston Rigg, MD (Inactive) as Consulting Physician (Dermatology) Cheryn Nickels, MD as Referring Physician (Allergy and Immunology) Vicci Mcardle, OD (Optometry)  I have updated your Care Teams any recent Medical Services you may have received from other providers in the past year.     Assessment:   This is a routine wellness examination for Shajuan.  Hearing/Vision screen Hearing Screening - Comments:: Pt has a little bit hearing dif/saw Dr. Victory on [address]/last apt a year ago, pcp is aware Vision Screening - Comments:: Pt wear glasses denies vision dif/pt goes to [facility] In [place], [place]/last apt 2025   Goals Addressed             This Visit's Progress    Patient Stated       Would like to be able to do the things that  she did before once her knees heals/walk better than before       Depression Screen     01/26/2024   11:26 AM 11/18/2023   11:11 AM 08/04/2023    9:34 AM 05/11/2023    9:26 AM 04/13/2023    8:26 AM 04/13/2023    8:25 AM 01/23/2023    9:54 AM  PHQ 2/9 Scores  PHQ - 2 Score 0 0 0 0 0 0 0  PHQ- 9 Score 0 4 4 2  0 0     Fall Risk     01/26/2024   11:16 AM 11/18/2023   11:11 AM 05/11/2023    9:27 AM 04/13/2023    8:30 AM 01/23/2023    9:52 AM  Fall Risk   Falls in the past year? 0 1 1 0 1  Number falls in past yr: 0 0 0 0 1  Injury with Fall? 0 1 1 0 1  Risk for fall due to : No Fall Risks Impaired balance/gait Impaired balance/gait No Fall Risks History of fall(s);Impaired balance/gait;Orthopedic patient  Follow up Falls evaluation completed Falls evaluation completed Falls evaluation completed Education provided Education provided;Falls prevention discussed;Falls evaluation completed    MEDICARE RISK AT HOME:  Medicare Risk at Home Any stairs in or around the home?: No If so, are there any without handrails?: No Home free of loose throw rugs in walkways, pet beds, electrical cords, etc?: Yes Adequate lighting in your home to reduce risk of falls?:  Yes Life alert?: No Use of a cane, walker or w/c?: Yes (walker) Grab bars in the bathroom?: No Shower chair or bench in shower?: Yes Elevated toilet seat or a handicapped toilet?: Yes  TIMED UP AND GO:  Was the test performed?  no  Cognitive Function: 6CIT completed        01/26/2024   11:27 AM 01/23/2023    9:56 AM 01/21/2022   11:21 AM 01/08/2021   11:10 AM 03/22/2019   11:31 AM  6CIT Screen  What Year? 0 points 0 points 0 points 0 points 0 points  What month? 0 points 0 points 0 points 0 points   What time? 0 points 0 points 0 points 0 points 0 points  Count back from 20 0 points 0 points 0 points 0 points 0 points  Months in reverse 0 points 0 points 0 points 0 points 0 points  Repeat phrase 0 points 0 points 4 points 0 points  0 points  Total Score 0 points 0 points 4 points 0 points     Immunizations Immunization History  Administered Date(s) Administered   Fluad Quad(high Dose 65+) 05/23/2019, 05/30/2020, 05/27/2021, 06/02/2022   Fluad Trivalent(High Dose 65+) 05/22/2023   Influenza, High Dose Seasonal PF 08/07/2016, 08/06/2017, 08/12/2018, 10/20/2019, 10/17/2021   Influenza,inj,quad, With Preservative 05/06/2017, 05/08/2018   Influenza-Unspecified 05/23/2015, 05/08/2018   PFIZER(Purple Top)SARS-COV-2 Vaccination 10/24/2019, 11/14/2019   Pneumococcal Conjugate-13 05/23/2019   Pneumococcal Polysaccharide-23 08/07/2016, 08/06/2017, 06/09/2018, 08/12/2018, 10/20/2019, 10/17/2021   Pneumococcal-Unspecified 05/23/2015   Tdap 11/18/2023   Zoster Recombinant(Shingrix) 09/05/2019, 11/28/2019   Zoster, Live 06/20/2015    Screening Tests Health Maintenance  Topic Date Due   Medicare Annual Wellness (AWV)  01/23/2024   COVID-19 Vaccine (3 - Pfizer risk series) 02/10/2025 (Originally 12/12/2019)   INFLUENZA VACCINE  03/04/2024   MAMMOGRAM  10/05/2024   DEXA SCAN  04/03/2027   Colonoscopy  08/24/2033   DTaP/Tdap/Td (2 - Td or Tdap) 11/17/2033   Pneumococcal Vaccine: 50+ Years  Completed   Hepatitis C Screening  Completed   Zoster Vaccines- Shingrix  Completed   Hepatitis B Vaccines  Aged Out   HPV VACCINES  Aged Out   Meningococcal B Vaccine  Aged Out    Health Maintenance  Health Maintenance Due  Topic Date Due   Medicare Annual Wellness (AWV)  01/23/2024   Health Maintenance Items Addressed: See Nurse Notes at the end of this note  Additional Screening:  Vision Screening: Recommended annual ophthalmology exams for early detection of glaucoma and other disorders of the eye. Would you like a referral to an eye doctor? No    Dental Screening: Recommended annual dental exams for proper oral hygiene  Community Resource Referral / Chronic Care Management: CRR required this visit?  No   CCM  required this visit?  No   Plan:    I have personally reviewed and noted the following in the patient's chart:   Medical and social history Use of alcohol, tobacco or illicit drugs  Current medications and supplements including opioid prescriptions. Patient is not currently taking opioid prescriptions. Functional ability and status Nutritional status Physical activity Advanced directives List of other physicians Hospitalizations, surgeries, and ER visits in previous 12 months Vitals Screenings to include cognitive, depression, and falls Referrals and appointments  In addition, I have reviewed and discussed with patient certain preventive protocols, quality metrics, and best practice recommendations. A written personalized care plan for preventive services as well as general preventive health recommendations were provided to patient.  Ozie Ned, CMA   01/26/2024   After Visit Summary: (MyChart) Due to this being a telephonic visit, the after visit summary with patients personalized plan was offered to patient via MyChart   Notes: Nothing significant to report at this time.

## 2024-01-27 ENCOUNTER — Ambulatory Visit (HOSPITAL_COMMUNITY)
Admission: RE | Admit: 2024-01-27 | Discharge: 2024-01-27 | Disposition: A | Discharge status: Home / Self Care | Source: Ambulatory Visit | Attending: Vascular Surgery | Admitting: Vascular Surgery

## 2024-01-27 ENCOUNTER — Other Ambulatory Visit (HOSPITAL_COMMUNITY): Payer: Self-pay | Admitting: Physician Assistant

## 2024-01-27 DIAGNOSIS — M79605 Pain in left leg: Secondary | ICD-10-CM | POA: Diagnosis not present

## 2024-01-27 DIAGNOSIS — M7989 Other specified soft tissue disorders: Secondary | ICD-10-CM

## 2024-01-28 ENCOUNTER — Encounter: Payer: Self-pay | Admitting: *Deleted

## 2024-01-28 ENCOUNTER — Ambulatory Visit: Admitting: *Deleted

## 2024-01-28 DIAGNOSIS — M25662 Stiffness of left knee, not elsewhere classified: Secondary | ICD-10-CM

## 2024-01-28 DIAGNOSIS — R600 Localized edema: Secondary | ICD-10-CM | POA: Diagnosis not present

## 2024-01-28 DIAGNOSIS — G8929 Other chronic pain: Secondary | ICD-10-CM | POA: Diagnosis not present

## 2024-01-28 DIAGNOSIS — M25562 Pain in left knee: Secondary | ICD-10-CM | POA: Diagnosis not present

## 2024-01-28 DIAGNOSIS — M6281 Muscle weakness (generalized): Secondary | ICD-10-CM | POA: Diagnosis not present

## 2024-01-28 NOTE — Therapy (Signed)
 OUTPATIENT PHYSICAL THERAPY LOWER EXTREMITY TREATMENT   Patient Name: Kristina Mclaughlin MRN: [id number] DOB:[birth year], 73 y.o., female Today's Date: 01/28/2024  END OF SESSION:  PT End of Session - 01/28/24 1401     Visit Number 5    Number of Visits 12    Date for PT Re-Evaluation 02/26/24    PT Start Time 1345    PT Stop Time 1442    PT Time Calculation (min) 57 min          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, lateral back   Depression    Essential hypertension 10/29/2015   GERD (gastroesophageal reflux disease)    Hypertension    Insomnia    Migraine headache    Tear of medial meniscus of knee 09/06/2018   Past Surgical History:  Procedure Laterality Date   ABDOMINAL HYSTERECTOMY     BACK SURGERY     BREAST SURGERY     biopsy x2; reduction   EXAMINATION UNDER ANESTHESIA  08/27/2012   LIPOMA EXCISION Right 07/03/2021   Procedure: EXCISION LIPOMA; ANKLE;  Surgeon: Kallie Manuelita BROCKS, MD;  Location: [facility];  Service: General;  Laterality: Right;   LIPOMA EXCISION N/A 07/03/2021   Procedure: EXCISION LIPOMA; SUPRAPUBIC;  Surgeon: Kallie Manuelita BROCKS, MD;  Location: [facility];  Service: General;  Laterality: N/A;   MASS EXCISION Left 07/03/2021   Procedure: EXCISION LIPOMA; ANKLE;  Surgeon: Kallie Manuelita BROCKS, MD;  Location: [facility];  Service: General;  Laterality: Left;   MASS EXCISION Left 07/03/2021   Procedure: EXCISION OF LIPOMA; ARM;  Surgeon: Kallie Manuelita BROCKS, MD;  Location: [facility];  Service: General;  Laterality: Left;   NECK SURGERY     Mass removed   REDUCTION MAMMAPLASTY Bilateral 2002   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   XI ROBOTIC ASSISTED HIATAL HERNIA REPAIR N/A 11/18/2022   Procedure: ROBOTIC HIATAL HERNIA REPAIR;  Surgeon: Lyndel Deward PARAS, MD;  Location: [facility];  Service: General;  Laterality: N/A;   Patient Active Problem List   Diagnosis Date  Noted   Non-recurrent acute serous otitis media of left ear 05/11/2023   Hiatal hernia 11/18/2022   Gastroesophageal reflux disease 02/10/2022   Chronic superficial gastritis without bleeding 02/10/2022   Fatty liver 02/10/2022   Allergic rhinitis due to animal (cat) (dog) hair and dander 01/20/2022   Cough variant asthma 01/20/2022   Chronic constipation 09/18/2021   Neuropathic pain 08/22/2021   Lipoma of foot 06/11/2021   Lipoma of upper arm 06/11/2021   Sensorineural hearing loss (SNHL) of left ear with unrestricted hearing of right ear 12/11/2020   Lipoma of abdominal wall 12/15/2019   Breast lipoma 12/15/2019   Lipoma of both lower extremities 12/15/2019   Primary insomnia 05/11/2018   Unilateral primary osteoarthritis, left knee 05/11/2018   Hemorrhoids 01/22/2018   Essential hypertension 10/29/2015   Upper airway cough syndrome 10/20/2014    REFERRING PROVIDER: Dempsey Moan MD  REFERRING DIAG: Left total knee replacement  THERAPY DIAG:  Chronic pain of left knee  Localized edema  Stiffness of left knee, not elsewhere classified  Muscle weakness (generalized)  Rationale for Evaluation and Treatment: Rehabilitation  ONSET DATE: Surgery date (01/12/24).  SUBJECTIVE:   SUBJECTIVE STATEMENT:  Pain about a 4/10.   LT knee sore from last visit, but doing better.   PERTINENT HISTORY: See above.   PAIN:  Are you having pain? Yes: NPRS scale: 5-6/10. Pain location: Left knee. Pain description: Sharp. Aggravating factors: Movement. Relieving factors: Not much.  PRECAUTIONS: Other: No ultrasound.    WEIGHT BEARING RESTRICTIONS: No  FALLS:  Has patient fallen in last 6 months? No  LIVING ENVIRONMENT: Lives with: lives with their spouse Lives in: House/apartment Stairs: 4-6.  Non-reciprocating.   Has following equipment at home: Vannie - 2 wheeled  PLOF: Independent  PATIENT GOALS: Perform ADL's without knee pain.    OBJECTIVE:   PATIENT SURVEYS:   LEFS: 13/50.   EDEMA:  Circumferential: 9 cms > on left than right.    PALPATION: C/o diffuse left knee pain.    LOWER EXTREMITY ROM:  In supine:  Left knee extension is -20 degrees and flexion assessed in the seated position to 85 degrees.  LOWER EXTREMITY MMT:  Patient currently unable to to perform a left antigravity SAQ and SLR.  GAIT: Step-to gait pattern with a FWW.                                                                                                                              TREATMENT DATE:    LT knee  01/28/24:  Nustep level 1 starting at seat 11,10, and progressing to seat 9 x 17 minutes  LAQs 2#  3x10,  HS curl   Red  tband   3 x10, 6in step up x20 Rocker board x 5 mins Manual STW to LT quad as well as HS's and posterior aspect.gentle low load long duration stretching into extension, but very sensitive posterior aspect today   LE elevation and vasopneumatic on low to patient's left knee x 15 minutes.     01/15/24:  Nustep on level 1 moving seated forward x 1 to increase knee flexion x 8 minutes f/b LE elevation and vasopneumatic on low to patient's left knee x 15 minutes.      PATIENT EDUCATION:  Education details: Discussed exercise progression and importance of compliance to her HEP and managing pain by icing an dtaking medication as instructed by MD. Person educated: Patient and Spouse Education method: Medical illustrator Education comprehension: verbalized understanding  HOME EXERCISE PROGRAM:   ASSESSMENT:  CLINICAL IMPRESSION: Patient arrived today ambulating with FWW and doing some better. She was able Nustep progression to seat 9 today and did well with Exs, but continues to have posterior knee pain that hinders knee extension progressions.  Her cc was posterior knee pain with any extension stretching today and was hypersensitive.Rx focused on ROM progression and quad activation. Vaso end of session.    OBJECTIVE IMPAIRMENTS:  Abnormal gait, decreased activity tolerance, difficulty walking, decreased ROM, decreased strength, increased edema, and pain.   ACTIVITY LIMITATIONS: carrying, lifting, bending, standing, sleeping, stairs, transfers, and locomotion level  PARTICIPATION LIMITATIONS: meal prep, cleaning, laundry, driving, shopping, community activity, and yard work  PERSONAL FACTORS: Time since onset of injury/illness/exacerbation are also affecting patient's functional outcome.  REHAB POTENTIAL: Good  CLINICAL DECISION MAKING: Stable/uncomplicated  EVALUATION COMPLEXITY: Low   GOALS:  SHORT TERM GOALS: Target date: 01/29/24:  Ind with an initial HEP. Goal status: MET  2.  Full active left knee extension.  Goal status: INITIAL  LONG TERM GOALS: Target date: 02/26/24.  Ind with an advanced HEP.  Goal status: INITIAL  2.  Active left knee flexion to 115 degrees+ so the patient can perform functional tasks and do so with pain not > 2-3/10.  Goal status: INITIAL  3.  Increase left hip and knee strength to a solid 4+/5 to provide good stability for accomplishment of functional activities.  Goal status: INITIAL  4.  Decrease edema to within 3 cms of non-affected side to assist with pain reduction and range of motion gains.  Goal status: INITIAL  5.  Perform a reciprocating stair gait with one railing with pain not > 2-3/10.  Goal status: INITIAL  6.  Improve LEFS score to 49-50 points. Baseline:  Goal status: INITIAL   PLAN:  PT FREQUENCY: 2x/week  PT DURATION: 6 weeks  PLANNED INTERVENTIONS: 97110-Therapeutic exercises, 97530- Therapeutic activity, [id number]- Neuromuscular re-education, 97535- Self Care, [id number]- Manual therapy, [id number]- Electrical stimulation (unattended), [id number]- Vasopneumatic device, Patient/Family education, Stair training, and Cryotherapy  PLAN FOR NEXT SESSION: Nustep, PROM.  Progress per TKA protocol.  LE elevation and vasopneumatic.     Sena Clouatre,CHRIS, PTA 01/28/2024,  2:42 PM

## 2024-02-01 ENCOUNTER — Encounter: Admitting: *Deleted

## 2024-02-02 ENCOUNTER — Ambulatory Visit: Attending: Orthopedic Surgery

## 2024-02-02 DIAGNOSIS — R600 Localized edema: Secondary | ICD-10-CM | POA: Diagnosis not present

## 2024-02-02 DIAGNOSIS — M6281 Muscle weakness (generalized): Secondary | ICD-10-CM | POA: Diagnosis not present

## 2024-02-02 DIAGNOSIS — M25662 Stiffness of left knee, not elsewhere classified: Secondary | ICD-10-CM | POA: Diagnosis not present

## 2024-02-02 DIAGNOSIS — G8929 Other chronic pain: Secondary | ICD-10-CM | POA: Diagnosis not present

## 2024-02-02 DIAGNOSIS — M25562 Pain in left knee: Secondary | ICD-10-CM | POA: Insufficient documentation

## 2024-02-02 NOTE — Therapy (Signed)
 OUTPATIENT PHYSICAL THERAPY LOWER EXTREMITY TREATMENT   Patient Name: Kristina Mclaughlin MRN: [id number] DOB:1950/09/06, 73 y.o., female Today's Date: 02/02/2024  END OF SESSION:  PT End of Session - 02/02/24 1432     Visit Number 6    Number of Visits 12    Date for PT Re-Evaluation 02/26/24    PT Start Time 1430    PT Stop Time 1531    PT Time Calculation (min) 61 min    Activity Tolerance Patient tolerated treatment well    Behavior During Therapy [facility] for tasks assessed/performed           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, lateral back   Depression    Essential hypertension 10/29/2015   GERD (gastroesophageal reflux disease)    Hypertension    Insomnia    Migraine headache    Tear of medial meniscus of knee 09/06/2018   Past Surgical History:  Procedure Laterality Date   ABDOMINAL HYSTERECTOMY     BACK SURGERY     BREAST SURGERY     biopsy x2; reduction   EXAMINATION UNDER ANESTHESIA  08/27/2012   LIPOMA EXCISION Right 07/03/2021   Procedure: EXCISION LIPOMA; ANKLE;  Surgeon: Kallie Manuelita BROCKS, MD;  Location: [facility];  Service: General;  Laterality: Right;   LIPOMA EXCISION N/A 07/03/2021   Procedure: EXCISION LIPOMA; SUPRAPUBIC;  Surgeon: Kallie Manuelita BROCKS, MD;  Location: [facility];  Service: General;  Laterality: N/A;   MASS EXCISION Left 07/03/2021   Procedure: EXCISION LIPOMA; ANKLE;  Surgeon: Kallie Manuelita BROCKS, MD;  Location: [facility];  Service: General;  Laterality: Left;   MASS EXCISION Left 07/03/2021   Procedure: EXCISION OF LIPOMA; ARM;  Surgeon: Kallie Manuelita BROCKS, MD;  Location: [facility];  Service: General;  Laterality: Left;   NECK SURGERY     Mass removed   REDUCTION MAMMAPLASTY Bilateral 2002   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   XI ROBOTIC ASSISTED HIATAL HERNIA REPAIR N/A 11/18/2022   Procedure: ROBOTIC HIATAL HERNIA REPAIR;  Surgeon: Lyndel Deward PARAS, MD;  Location: [facility];  Service: General;  Laterality: N/A;   Patient Active Problem List   Diagnosis Date Noted   Non-recurrent acute serous otitis media of left ear 05/11/2023   Hiatal hernia 11/18/2022   Gastroesophageal reflux disease 02/10/2022   Chronic superficial gastritis without bleeding 02/10/2022   Fatty liver 02/10/2022   Allergic rhinitis due to animal (cat) (dog) hair and dander 01/20/2022   Cough variant asthma 01/20/2022   Chronic constipation 09/18/2021   Neuropathic pain 08/22/2021   Lipoma of foot 06/11/2021   Lipoma of upper arm 06/11/2021   Sensorineural hearing loss (SNHL) of left ear with unrestricted hearing of right ear 12/11/2020   Lipoma of abdominal wall 12/15/2019   Breast lipoma 12/15/2019   Lipoma of both lower extremities 12/15/2019   Primary insomnia 05/11/2018   Unilateral primary osteoarthritis, left knee 05/11/2018   Hemorrhoids 01/22/2018   Essential hypertension 10/29/2015   Upper airway cough syndrome 10/20/2014    REFERRING PROVIDER: Dempsey Moan MD  REFERRING DIAG: Left total knee replacement  THERAPY DIAG:  Chronic pain of left knee  Localized edema  Stiffness of left knee, not elsewhere classified  Muscle weakness (generalized)  Rationale for Evaluation and Treatment: Rehabilitation  ONSET DATE: Surgery date (01/12/24).  SUBJECTIVE:   SUBJECTIVE STATEMENT: Patient reports that her knee isn't  hurting too bad today. She tried putting heat on the back of her knee while using ice on the front of her knee which seemed to help.   PERTINENT HISTORY: See above.   PAIN:  Are you having pain? Yes: NPRS scale: no pain score provided. Pain location: Left knee. Pain description: Sharp. Aggravating factors: Movement. Relieving factors: Not much.  PRECAUTIONS: Other: No ultrasound.    WEIGHT BEARING RESTRICTIONS: No  FALLS:  Has patient fallen in last 6 months? No  LIVING ENVIRONMENT: Lives with: lives with their  spouse Lives in: House/apartment Stairs: 4-6.  Non-reciprocating.   Has following equipment at home: Vannie - 2 wheeled  PLOF: Independent  PATIENT GOALS: Perform ADL's without knee pain.    OBJECTIVE:   PATIENT SURVEYS:  LEFS: 13/50.   EDEMA:  Circumferential: 9 cms > on left than right.    PALPATION: C/o diffuse left knee pain.    LOWER EXTREMITY ROM:  In supine:  Left knee extension is -20 degrees and flexion assessed in the seated position to 85 degrees.  LOWER EXTREMITY MMT:  Patient currently unable to to perform a left antigravity SAQ and SLR.  GAIT: Step-to gait pattern with a FWW.                                                                                                                              TREATMENT DATE:    LT knee                                   02/02/24 EXERCISE LOG  Exercise Repetitions and Resistance Comments  Nustep  L3 x 16 minutes @ seat 10-9   Rocker board  4.5 minutes    Standing HS stretch  4 x 30 seconds    Backward step  15 reps w/ 10 second hold   Squatting  20 reps   BUE support from parallel bars  LAQ  15 reps  LLE only    Blank cell = exercise not performed today  Modalities: no redness or adverse reaction to today's modalities  Date:  Vaso: Knee, 34 degrees; low pressure, 15 mins, Pain and Edema  01/28/24:  Nustep level 1 starting at seat 11,10, and progressing to seat 9 x 17 minutes  LAQs 2#  3x10,  HS curl   Red  tband   3 x10, 6in step up x20 Rocker board x 5 mins Manual STW to LT quad as well as HS's and posterior aspect.gentle low load long duration stretching into extension, but very sensitive posterior aspect today   LE elevation and vasopneumatic on low to patient's left knee x 15 minutes.     01/15/24:  Nustep on level 1 moving seated forward x 1 to increase knee flexion x 8 minutes f/b LE elevation and vasopneumatic on low to patient's left knee x 15 minutes.  PATIENT EDUCATION:  Education details:  healing and expectations for soreness Person educated: Patient Education method: Explanation Education comprehension: verbalized understanding  HOME EXERCISE PROGRAM:   ASSESSMENT:  CLINICAL IMPRESSION: Patient was introduced to multiple new interventions for improved knee extension. She required minimal cueing with today's new interventions for proper exercise performance. Squatting was the most difficult and fatiguing of any of today's interventions. She reported that her knee was hurting after getting a cramp during today's modalities. She continues to require skilled physical therapy to address her remaining impairments to return to her prior level of function.   OBJECTIVE IMPAIRMENTS: Abnormal gait, decreased activity tolerance, difficulty walking, decreased ROM, decreased strength, increased edema, and pain.   ACTIVITY LIMITATIONS: carrying, lifting, bending, standing, sleeping, stairs, transfers, and locomotion level  PARTICIPATION LIMITATIONS: meal prep, cleaning, laundry, driving, shopping, community activity, and yard work  PERSONAL FACTORS: Time since onset of injury/illness/exacerbation are also affecting patient's functional outcome.   REHAB POTENTIAL: Good  CLINICAL DECISION MAKING: Stable/uncomplicated  EVALUATION COMPLEXITY: Low   GOALS:  SHORT TERM GOALS: Target date: 01/29/24:  Ind with an initial HEP. Goal status: MET  2.  Full active left knee extension.  Goal status: INITIAL  LONG TERM GOALS: Target date: 02/26/24.  Ind with an advanced HEP.  Goal status: INITIAL  2.  Active left knee flexion to 115 degrees+ so the patient can perform functional tasks and do so with pain not > 2-3/10.  Goal status: INITIAL  3.  Increase left hip and knee strength to a solid 4+/5 to provide good stability for accomplishment of functional activities.  Goal status: INITIAL  4.  Decrease edema to within 3 cms of non-affected side to assist with pain reduction and range  of motion gains.  Goal status: INITIAL  5.  Perform a reciprocating stair gait with one railing with pain not > 2-3/10.  Goal status: INITIAL  6.  Improve LEFS score to 49-50 points. Baseline:  Goal status: INITIAL   PLAN:  PT FREQUENCY: 2x/week  PT DURATION: 6 weeks  PLANNED INTERVENTIONS: 97110-Therapeutic exercises, 97530- Therapeutic activity, [id number]- Neuromuscular re-education, 97535- Self Care, [id number]- Manual therapy, G0283- Electrical stimulation (unattended), 97016- Vasopneumatic device, Patient/Family education, Stair training, and Cryotherapy  PLAN FOR NEXT SESSION: Nustep, PROM.  Progress per TKA protocol.  LE elevation and vasopneumatic.     Lacinda JAYSON Fass, PT 02/02/2024, 3:41 PM

## 2024-02-03 DIAGNOSIS — J3089 Other allergic rhinitis: Secondary | ICD-10-CM | POA: Diagnosis not present

## 2024-02-03 DIAGNOSIS — J3081 Allergic rhinitis due to animal (cat) (dog) hair and dander: Secondary | ICD-10-CM | POA: Diagnosis not present

## 2024-02-03 DIAGNOSIS — J301 Allergic rhinitis due to pollen: Secondary | ICD-10-CM | POA: Diagnosis not present

## 2024-02-04 ENCOUNTER — Ambulatory Visit: Admitting: *Deleted

## 2024-02-04 ENCOUNTER — Encounter: Payer: Self-pay | Admitting: *Deleted

## 2024-02-04 DIAGNOSIS — R600 Localized edema: Secondary | ICD-10-CM

## 2024-02-04 DIAGNOSIS — M25562 Pain in left knee: Secondary | ICD-10-CM | POA: Diagnosis not present

## 2024-02-04 DIAGNOSIS — G8929 Other chronic pain: Secondary | ICD-10-CM

## 2024-02-04 DIAGNOSIS — M6281 Muscle weakness (generalized): Secondary | ICD-10-CM

## 2024-02-04 DIAGNOSIS — M25662 Stiffness of left knee, not elsewhere classified: Secondary | ICD-10-CM

## 2024-02-04 NOTE — Therapy (Signed)
 OUTPATIENT PHYSICAL THERAPY LOWER EXTREMITY TREATMENT   Patient Name: Kristina Mclaughlin MRN: [id number] DOB:February 05, 1951, 73 y.o., female Today's Date: 02/04/2024  END OF SESSION:  PT End of Session - 02/04/24 1400     Visit Number 7    Number of Visits 12    Date for PT Re-Evaluation 02/26/24    PT Start Time 1345    PT Stop Time 1446    PT Time Calculation (min) 61 min           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, lateral back   Depression    Essential hypertension 10/29/2015   GERD (gastroesophageal reflux disease)    Hypertension    Insomnia    Migraine headache    Tear of medial meniscus of knee 09/06/2018   Past Surgical History:  Procedure Laterality Date   ABDOMINAL HYSTERECTOMY     BACK SURGERY     BREAST SURGERY     biopsy x2; reduction   EXAMINATION UNDER ANESTHESIA  08/27/2012   LIPOMA EXCISION Right 07/03/2021   Procedure: EXCISION LIPOMA; ANKLE;  Surgeon: Kallie Manuelita BROCKS, MD;  Location: [facility];  Service: General;  Laterality: Right;   LIPOMA EXCISION N/A 07/03/2021   Procedure: EXCISION LIPOMA; SUPRAPUBIC;  Surgeon: Kallie Manuelita BROCKS, MD;  Location: [facility];  Service: General;  Laterality: N/A;   MASS EXCISION Left 07/03/2021   Procedure: EXCISION LIPOMA; ANKLE;  Surgeon: Kallie Manuelita BROCKS, MD;  Location: [facility];  Service: General;  Laterality: Left;   MASS EXCISION Left 07/03/2021   Procedure: EXCISION OF LIPOMA; ARM;  Surgeon: Kallie Manuelita BROCKS, MD;  Location: [facility];  Service: General;  Laterality: Left;   NECK SURGERY     Mass removed   REDUCTION MAMMAPLASTY Bilateral 2002   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   XI ROBOTIC ASSISTED HIATAL HERNIA REPAIR N/A 11/18/2022   Procedure: ROBOTIC HIATAL HERNIA REPAIR;  Surgeon: Lyndel Deward PARAS, MD;  Location: [facility];  Service: General;  Laterality: N/A;   Patient Active Problem List   Diagnosis Date  Noted   Non-recurrent acute serous otitis media of left ear 05/11/2023   Hiatal hernia 11/18/2022   Gastroesophageal reflux disease 02/10/2022   Chronic superficial gastritis without bleeding 02/10/2022   Fatty liver 02/10/2022   Allergic rhinitis due to animal (cat) (dog) hair and dander 01/20/2022   Cough variant asthma 01/20/2022   Chronic constipation 09/18/2021   Neuropathic pain 08/22/2021   Lipoma of foot 06/11/2021   Lipoma of upper arm 06/11/2021   Sensorineural hearing loss (SNHL) of left ear with unrestricted hearing of right ear 12/11/2020   Lipoma of abdominal wall 12/15/2019   Breast lipoma 12/15/2019   Lipoma of both lower extremities 12/15/2019   Primary insomnia 05/11/2018   Unilateral primary osteoarthritis, left knee 05/11/2018   Hemorrhoids 01/22/2018   Essential hypertension 10/29/2015   Upper airway cough syndrome 10/20/2014    REFERRING PROVIDER: Dempsey Moan MD  REFERRING DIAG: Left total knee replacement  THERAPY DIAG:  Chronic pain of left knee  Localized edema  Stiffness of left knee, not elsewhere classified  Muscle weakness (generalized)  Rationale for Evaluation and Treatment: Rehabilitation  ONSET DATE: Surgery date (01/12/24).  SUBJECTIVE:   SUBJECTIVE STATEMENT: Patient reports that her knee isn't hurting too bad today , but concerned with Lymph  PERTINENT HISTORY: See above.   PAIN:  Are  you having pain? Yes: NPRS scale: no pain score provided. Pain location: Left knee. Pain description: Sharp. Aggravating factors: Movement. Relieving factors: Not much.  PRECAUTIONS: Other: No ultrasound.    WEIGHT BEARING RESTRICTIONS: No  FALLS:  Has patient fallen in last 6 months? No  LIVING ENVIRONMENT: Lives with: lives with their spouse Lives in: House/apartment Stairs: 4-6.  Non-reciprocating.   Has following equipment at home: Vannie - 2 wheeled  PLOF: Independent  PATIENT GOALS: Perform ADL's without knee pain.     OBJECTIVE:   PATIENT SURVEYS:  LEFS: 13/50.   EDEMA:  Circumferential: 9 cms > on left than right.    PALPATION: C/o diffuse left knee pain.    LOWER EXTREMITY ROM:  In supine:  Left knee extension is -20 degrees and flexion assessed in the seated position to 85 degrees.  LOWER EXTREMITY MMT:  Patient currently unable to to perform a left antigravity SAQ and SLR.  GAIT: Step-to gait pattern with a FWW.                                                                                                                              TREATMENT DATE:    LT knee                                   02/02/24 EXERCISE LOG  Exercise Repetitions and Resistance Comments  Nustep  L3 x 16 minutes @ seat 10-9   Rocker board  5 minutes    Standing HS stretch  4 x 30 seconds    6in step up  3x10   Squatting   BUE support from parallel bars  LAQ  15 reps  LLE only   8in box lunge x15    Blank cell = exercise not performed today  Modalities: no redness or adverse reaction to today's modalities  Date:  Vaso: Knee, 34 degrees; low pressure, 15 mins, Pain and Edema  01/28/24:  Nustep level 1 starting at seat 11,10, and progressing to seat 9 x 17 minutes  LAQs 2#  3x10,  HS curl   Red  tband   3 x10, 6in step up x20 Rocker board x 5 mins Manual STW to LT quad as well as HS's and posterior aspect.gentle low load long duration stretching into extension, but very sensitive posterior aspect today   LE elevation and vasopneumatic on low to patient's left knee x 15 minutes.     01/15/24:  Nustep on level 1 moving seated forward x 1 to increase knee flexion x 8 minutes f/b LE elevation and vasopneumatic on low to patient's left knee x 15 minutes.      PATIENT EDUCATION:  Education details: healing and expectations for soreness Person educated: Patient Education method: Explanation Education comprehension: verbalized understanding  HOME EXERCISE PROGRAM:   ASSESSMENT:  CLINICAL  IMPRESSION: Patient arrived today doing fairly well  with LT knee, but does have some drainage distal aspect of incisionshe did well with therex and manual PROM for flexion and extension.  Pt to notify MD office after visit today.  OBJECTIVE IMPAIRMENTS: Abnormal gait, decreased activity tolerance, difficulty walking, decreased ROM, decreased strength, increased edema, and pain.   ACTIVITY LIMITATIONS: carrying, lifting, bending, standing, sleeping, stairs, transfers, and locomotion level  PARTICIPATION LIMITATIONS: meal prep, cleaning, laundry, driving, shopping, community activity, and yard work  PERSONAL FACTORS: Time since onset of injury/illness/exacerbation are also affecting patient's functional outcome.   REHAB POTENTIAL: Good  CLINICAL DECISION MAKING: Stable/uncomplicated  EVALUATION COMPLEXITY: Low   GOALS:  SHORT TERM GOALS: Target date: 01/29/24:  Ind with an initial HEP. Goal status: MET  2.  Full active left knee extension.  Goal status: On going  LONG TERM GOALS: Target date: 02/26/24.  Ind with an advanced HEP.  Goal status: INITIAL  2.  Active left knee flexion to 115 degrees+ so the patient can perform functional tasks and do so with pain not > 2-3/10.  Goal status: INITIAL  3.  Increase left hip and knee strength to a solid 4+/5 to provide good stability for accomplishment of functional activities.  Goal status: INITIAL  4.  Decrease edema to within 3 cms of non-affected side to assist with pain reduction and range of motion gains.  Goal status: INITIAL  5.  Perform a reciprocating stair gait with one railing with pain not > 2-3/10.  Goal status: INITIAL  6.  Improve LEFS score to 49-50 points. Baseline:  Goal status: INITIAL   PLAN:  PT FREQUENCY: 2x/week  PT DURATION: 6 weeks  PLANNED INTERVENTIONS: 97110-Therapeutic exercises, 97530- Therapeutic activity, [id number]- Neuromuscular re-education, 97535- Self Care, [id number]- Manual therapy, G0283-  Electrical stimulation (unattended), 97016- Vasopneumatic device, Patient/Family education, Stair training, and Cryotherapy  PLAN FOR NEXT SESSION: Nustep, PROM.  Progress per TKA protocol.  LE elevation and vasopneumatic.     Kristina Mclaughlin,CHRIS, PTA 02/04/2024, 6:21 PM

## 2024-02-08 ENCOUNTER — Ambulatory Visit: Admitting: *Deleted

## 2024-02-08 DIAGNOSIS — M25662 Stiffness of left knee, not elsewhere classified: Secondary | ICD-10-CM | POA: Diagnosis not present

## 2024-02-08 DIAGNOSIS — G8929 Other chronic pain: Secondary | ICD-10-CM | POA: Diagnosis not present

## 2024-02-08 DIAGNOSIS — J3081 Allergic rhinitis due to animal (cat) (dog) hair and dander: Secondary | ICD-10-CM | POA: Diagnosis not present

## 2024-02-08 DIAGNOSIS — M6281 Muscle weakness (generalized): Secondary | ICD-10-CM

## 2024-02-08 DIAGNOSIS — J301 Allergic rhinitis due to pollen: Secondary | ICD-10-CM | POA: Diagnosis not present

## 2024-02-08 DIAGNOSIS — M25562 Pain in left knee: Secondary | ICD-10-CM | POA: Diagnosis not present

## 2024-02-08 DIAGNOSIS — J3089 Other allergic rhinitis: Secondary | ICD-10-CM | POA: Diagnosis not present

## 2024-02-08 DIAGNOSIS — R600 Localized edema: Secondary | ICD-10-CM

## 2024-02-08 NOTE — Therapy (Signed)
 OUTPATIENT PHYSICAL THERAPY LOWER EXTREMITY TREATMENT   Patient Name: Kristina Mclaughlin MRN: [id number] DOB:10/30/1950, 73 y.o., female Today's Date: 02/08/2024  END OF SESSION:  PT End of Session - 02/08/24 1450     Visit Number 8    Number of Visits 12    Date for PT Re-Evaluation 02/26/24    PT Start Time 1430    PT Stop Time 1530    PT Time Calculation (min) 60 min           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, lateral back   Depression    Essential hypertension 10/29/2015   GERD (gastroesophageal reflux disease)    Hypertension    Insomnia    Migraine headache    Tear of medial meniscus of knee 09/06/2018   Past Surgical History:  Procedure Laterality Date   ABDOMINAL HYSTERECTOMY     BACK SURGERY     BREAST SURGERY     biopsy x2; reduction   EXAMINATION UNDER ANESTHESIA  08/27/2012   LIPOMA EXCISION Right 07/03/2021   Procedure: EXCISION LIPOMA; ANKLE;  Surgeon: Kallie Manuelita BROCKS, MD;  Location: [facility];  Service: General;  Laterality: Right;   LIPOMA EXCISION N/A 07/03/2021   Procedure: EXCISION LIPOMA; SUPRAPUBIC;  Surgeon: Kallie Manuelita BROCKS, MD;  Location: [facility];  Service: General;  Laterality: N/A;   MASS EXCISION Left 07/03/2021   Procedure: EXCISION LIPOMA; ANKLE;  Surgeon: Kallie Manuelita BROCKS, MD;  Location: [facility];  Service: General;  Laterality: Left;   MASS EXCISION Left 07/03/2021   Procedure: EXCISION OF LIPOMA; ARM;  Surgeon: Kallie Manuelita BROCKS, MD;  Location: [facility];  Service: General;  Laterality: Left;   NECK SURGERY     Mass removed   REDUCTION MAMMAPLASTY Bilateral 2002   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   XI ROBOTIC ASSISTED HIATAL HERNIA REPAIR N/A 11/18/2022   Procedure: ROBOTIC HIATAL HERNIA REPAIR;  Surgeon: Lyndel Deward PARAS, MD;  Location: [facility];  Service: General;  Laterality: N/A;   Patient Active Problem List   Diagnosis Date  Noted   Non-recurrent acute serous otitis media of left ear 05/11/2023   Hiatal hernia 11/18/2022   Gastroesophageal reflux disease 02/10/2022   Chronic superficial gastritis without bleeding 02/10/2022   Fatty liver 02/10/2022   Allergic rhinitis due to animal (cat) (dog) hair and dander 01/20/2022   Cough variant asthma 01/20/2022   Chronic constipation 09/18/2021   Neuropathic pain 08/22/2021   Lipoma of foot 06/11/2021   Lipoma of upper arm 06/11/2021   Sensorineural hearing loss (SNHL) of left ear with unrestricted hearing of right ear 12/11/2020   Lipoma of abdominal wall 12/15/2019   Breast lipoma 12/15/2019   Lipoma of both lower extremities 12/15/2019   Primary insomnia 05/11/2018   Unilateral primary osteoarthritis, left knee 05/11/2018   Hemorrhoids 01/22/2018   Essential hypertension 10/29/2015   Upper airway cough syndrome 10/20/2014    REFERRING PROVIDER: Dempsey Moan MD  REFERRING DIAG: Left total knee replacement  THERAPY DIAG:  Chronic pain of left knee  Localized edema  Stiffness of left knee, not elsewhere classified  Muscle weakness (generalized)  Rationale for Evaluation and Treatment: Rehabilitation  ONSET DATE: Surgery date (01/12/24).  SUBJECTIVE:   SUBJECTIVE STATEMENT: Patient reports that her knee isn't hurting too bad today. Called MD office about drainage, but they said it was okay/normal. They said to put  a band-aid.   PERTINENT HISTORY: See above.   PAIN:  Are you having pain? Yes: NPRS scale: 3-4/10 Pain location: Left knee. Pain description: Sharp. Aggravating factors: Movement. Relieving factors: Not much.  PRECAUTIONS: Other: No ultrasound.    WEIGHT BEARING RESTRICTIONS: No  FALLS:  Has patient fallen in last 6 months? No  LIVING ENVIRONMENT: Lives with: lives with their spouse Lives in: House/apartment Stairs: 4-6.  Non-reciprocating.   Has following equipment at home: Vannie - 2 wheeled  PLOF:  Independent  PATIENT GOALS: Perform ADL's without knee pain.    OBJECTIVE:   PATIENT SURVEYS:  LEFS: 13/50.   EDEMA:  Circumferential: 9 cms > on left than right.    PALPATION: C/o diffuse left knee pain.    LOWER EXTREMITY ROM:  In supine:  Left knee extension is -20 degrees and flexion assessed in the seated position to 85 degrees.  LOWER EXTREMITY MMT:  Patient currently unable to to perform a left antigravity SAQ and SLR.  GAIT: Step-to gait pattern with a FWW.                                                                                                                              TREATMENT DATE:    LT knee                                   02/08/24 EXERCISE LOG  Exercise Repetitions and Resistance Comments  Nustep  L3 x 19 minutes @ seat 10-9   Rocker board  5 minutes    Standing HS stretch     6in step up    Squatting   BUE support from parallel bars  LAQ    LLE only   14in box lunge x20    Blank cell = exercise not performed today  Modalities: no redness or adverse reaction to today's modalities Manual PROM/STW   to LT knee with long end-range holds Date:  Vaso: Knee, 34 degrees; low pressure, 15 mins, Pain and Edema  01/28/24:  Nustep level 1 starting at seat 11,10, and progressing to seat 9 x 17 minutes  LAQs 2#  3x10,  HS curl   Red  tband   3 x10, 6in step up x20 Rocker board x 5 mins Manual STW to LT quad as well as HS's and posterior aspect.gentle low load long duration stretching into extension, but very sensitive posterior aspect today   LE elevation and vasopneumatic on low to patient's left knee x 15 minutes.     01/15/24:  Nustep on level 1 moving seated forward x 1 to increase knee flexion x 8 minutes f/b LE elevation and vasopneumatic on low to patient's left knee x 15 minutes.      PATIENT EDUCATION:  Education details: healing and expectations for soreness Person educated: Patient Education method: Explanation Education comprehension:  verbalized understanding  HOME  EXERCISE PROGRAM:   ASSESSMENT:  CLINICAL IMPRESSION: Patient arrived today doing fairly well with LT knee and reports that MD office said to put a band-aid on the area and it should be okay. Today there was no drainage noted. She did well with therex and manual PROM for flexion and extension. She also called about Lymphedema Rxs, but no returned call yet   OBJECTIVE IMPAIRMENTS: Abnormal gait, decreased activity tolerance, difficulty walking, decreased ROM, decreased strength, increased edema, and pain.   ACTIVITY LIMITATIONS: carrying, lifting, bending, standing, sleeping, stairs, transfers, and locomotion level  PARTICIPATION LIMITATIONS: meal prep, cleaning, laundry, driving, shopping, community activity, and yard work  PERSONAL FACTORS: Time since onset of injury/illness/exacerbation are also affecting patient's functional outcome.   REHAB POTENTIAL: Good  CLINICAL DECISION MAKING: Stable/uncomplicated  EVALUATION COMPLEXITY: Low   GOALS:  SHORT TERM GOALS: Target date: 01/29/24:  Ind with an initial HEP. Goal status: MET  2.  Full active left knee extension.  Goal status: On going  LONG TERM GOALS: Target date: 02/26/24.  Ind with an advanced HEP.  Goal status: INITIAL  2.  Active left knee flexion to 115 degrees+ so the patient can perform functional tasks and do so with pain not > 2-3/10.  Goal status: INITIAL  3.  Increase left hip and knee strength to a solid 4+/5 to provide good stability for accomplishment of functional activities.  Goal status: INITIAL  4.  Decrease edema to within 3 cms of non-affected side to assist with pain reduction and range of motion gains.  Goal status: INITIAL  5.  Perform a reciprocating stair gait with one railing with pain not > 2-3/10.  Goal status: INITIAL  6.  Improve LEFS score to 49-50 points. Baseline:  Goal status: INITIAL   PLAN:  PT FREQUENCY: 2x/week  PT DURATION: 6  weeks  PLANNED INTERVENTIONS: 97110-Therapeutic exercises, 97530- Therapeutic activity, [id number]- Neuromuscular re-education, 97535- Self Care, 02859- Manual therapy, G0283- Electrical stimulation (unattended), 97016- Vasopneumatic device, Patient/Family education, Stair training, and Cryotherapy  PLAN FOR NEXT SESSION: Nustep, PROM.  Progress per TKA protocol.  LE elevation and vasopneumatic.     Cleona Doubleday,CHRIS, PTA 02/08/2024, 4:16 PM

## 2024-02-11 ENCOUNTER — Ambulatory Visit: Admitting: *Deleted

## 2024-02-11 ENCOUNTER — Encounter: Payer: Self-pay | Admitting: *Deleted

## 2024-02-11 DIAGNOSIS — M25662 Stiffness of left knee, not elsewhere classified: Secondary | ICD-10-CM | POA: Diagnosis not present

## 2024-02-11 DIAGNOSIS — R600 Localized edema: Secondary | ICD-10-CM

## 2024-02-11 DIAGNOSIS — G8929 Other chronic pain: Secondary | ICD-10-CM

## 2024-02-11 DIAGNOSIS — M25562 Pain in left knee: Secondary | ICD-10-CM | POA: Diagnosis not present

## 2024-02-11 DIAGNOSIS — M6281 Muscle weakness (generalized): Secondary | ICD-10-CM | POA: Diagnosis not present

## 2024-02-11 NOTE — Therapy (Signed)
 OUTPATIENT PHYSICAL THERAPY LOWER EXTREMITY TREATMENT   Patient Name: Kristina Mclaughlin MRN: [id number] DOB:Aug 06, 1950, 73 y.o., female Today's Date: 02/11/2024  END OF SESSION:  PT End of Session - 02/11/24 1110     Visit Number 9    Number of Visits 12    Date for PT Re-Evaluation 02/26/24    PT Start Time 1100    PT Stop Time 1203    PT Time Calculation (min) 63 min           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, lateral back   Depression    Essential hypertension 10/29/2015   GERD (gastroesophageal reflux disease)    Hypertension    Insomnia    Migraine headache    Tear of medial meniscus of knee 09/06/2018   Past Surgical History:  Procedure Laterality Date   ABDOMINAL HYSTERECTOMY     BACK SURGERY     BREAST SURGERY     biopsy x2; reduction   EXAMINATION UNDER ANESTHESIA  08/27/2012   LIPOMA EXCISION Right 07/03/2021   Procedure: EXCISION LIPOMA; ANKLE;  Surgeon: Kallie Manuelita BROCKS, MD;  Location: [facility];  Service: General;  Laterality: Right;   LIPOMA EXCISION N/A 07/03/2021   Procedure: EXCISION LIPOMA; SUPRAPUBIC;  Surgeon: Kallie Manuelita BROCKS, MD;  Location: [facility];  Service: General;  Laterality: N/A;   MASS EXCISION Left 07/03/2021   Procedure: EXCISION LIPOMA; ANKLE;  Surgeon: Kallie Manuelita BROCKS, MD;  Location: [facility];  Service: General;  Laterality: Left;   MASS EXCISION Left 07/03/2021   Procedure: EXCISION OF LIPOMA; ARM;  Surgeon: Kallie Manuelita BROCKS, MD;  Location: [facility];  Service: General;  Laterality: Left;   NECK SURGERY     Mass removed   REDUCTION MAMMAPLASTY Bilateral 2002   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   XI ROBOTIC ASSISTED HIATAL HERNIA REPAIR N/A 11/18/2022   Procedure: ROBOTIC HIATAL HERNIA REPAIR;  Surgeon: Lyndel Deward PARAS, MD;  Location: [facility];  Service: General;  Laterality: N/A;   Patient Active Problem List   Diagnosis  Date Noted   Non-recurrent acute serous otitis media of left ear 05/11/2023   Hiatal hernia 11/18/2022   Gastroesophageal reflux disease 02/10/2022   Chronic superficial gastritis without bleeding 02/10/2022   Fatty liver 02/10/2022   Allergic rhinitis due to animal (cat) (dog) hair and dander 01/20/2022   Cough variant asthma 01/20/2022   Chronic constipation 09/18/2021   Neuropathic pain 08/22/2021   Lipoma of foot 06/11/2021   Lipoma of upper arm 06/11/2021   Sensorineural hearing loss (SNHL) of left ear with unrestricted hearing of right ear 12/11/2020   Lipoma of abdominal wall 12/15/2019   Breast lipoma 12/15/2019   Lipoma of both lower extremities 12/15/2019   Primary insomnia 05/11/2018   Unilateral primary osteoarthritis, left knee 05/11/2018   Hemorrhoids 01/22/2018   Essential hypertension 10/29/2015   Upper airway cough syndrome 10/20/2014    REFERRING PROVIDER: Dempsey Moan MD  REFERRING DIAG: Left total knee replacement  THERAPY DIAG:  Chronic pain of left knee  Localized edema  Stiffness of left knee, not elsewhere classified  Rationale for Evaluation and Treatment: Rehabilitation  ONSET DATE: Surgery date (01/12/24).  SUBJECTIVE:   SUBJECTIVE STATEMENT: Patient reports that her knee isn't hurting too bad today. The sore is all healed up.   PERTINENT HISTORY: See above.   PAIN:  Are you having  pain? Yes: NPRS scale: 3-4/10 Pain location: Left knee. Pain description: Sharp. Aggravating factors: Movement. Relieving factors: Not much.  PRECAUTIONS: Other: No ultrasound.    WEIGHT BEARING RESTRICTIONS: No  FALLS:  Has patient fallen in last 6 months? No  LIVING ENVIRONMENT: Lives with: lives with their spouse Lives in: House/apartment Stairs: 4-6.  Non-reciprocating.   Has following equipment at home: Vannie - 2 wheeled  PLOF: Independent  PATIENT GOALS: Perform ADL's without knee pain.    OBJECTIVE:   PATIENT SURVEYS:  LEFS:  13/50.   EDEMA:  Circumferential: 9 cms > on left than right.    PALPATION: C/o diffuse left knee pain.    LOWER EXTREMITY ROM:  In supine:  Left knee extension is -20 degrees and flexion assessed in the seated position to 85 degrees.  LOWER EXTREMITY MMT:  Patient currently unable to to perform a left antigravity SAQ and SLR.  GAIT: Step-to gait pattern with a FWW.                                                                                                                              TREATMENT DATE:    LT knee                                   02/11/24 EXERCISE LOG   LT knee  Exercise Repetitions and Resistance Comments  Nustep  L3 x 19 minutes @ seat 10-9,8   Rocker board  5 minutes    Standing HS stretch     6in step up 3x10   Squatting   BUE support from parallel bars  LAQ   4#  3x  10 LLE only   14in box lunge x20    Blank cell = exercise not performed today  Modalities: no redness or adverse reaction to today's modalities Manual PROM/STW   to LT knee with long end-range holds    110 degrees today Date:  Vaso: Knee, 34 degrees; low pressure, 15 mins, Pain and Edema  01/28/24:  Nustep level 1 starting at seat 11,10, and progressing to seat 9 x 17 minutes  LAQs 2#  3x10,  HS curl   Red  tband   3 x10, 6in step up x20 Rocker board x 5 mins Manual STW to LT quad as well as HS's and posterior aspect.gentle low load long duration stretching into extension, but very sensitive posterior aspect today   LE elevation and vasopneumatic on low to patient's left knee x 15 minutes.     01/15/24:  Nustep on level 1 moving seated forward x 1 to increase knee flexion x 8 minutes f/b LE elevation and vasopneumatic on low to patient's left knee x 15 minutes.      PATIENT EDUCATION:  Education details: healing and expectations for soreness Person educated: Patient Education method: Explanation Education comprehension: verbalized understanding  HOME EXERCISE  PROGRAM:   ASSESSMENT:  CLINICAL IMPRESSION: Patient arrived today doing fairly well and incision has fully healed. She was able to continue with LT LE strengthening and ROM progression. She was able to reach 110 degrees PROM today.   OBJECTIVE IMPAIRMENTS: Abnormal gait, decreased activity tolerance, difficulty walking, decreased ROM, decreased strength, increased edema, and pain.   ACTIVITY LIMITATIONS: carrying, lifting, bending, standing, sleeping, stairs, transfers, and locomotion level  PARTICIPATION LIMITATIONS: meal prep, cleaning, laundry, driving, shopping, community activity, and yard work  PERSONAL FACTORS: Time since onset of injury/illness/exacerbation are also affecting patient's functional outcome.   REHAB POTENTIAL: Good  CLINICAL DECISION MAKING: Stable/uncomplicated  EVALUATION COMPLEXITY: Low   GOALS:  SHORT TERM GOALS: Target date: 01/29/24:  Ind with an initial HEP. Goal status: MET  2.  Full active left knee extension.  Goal status: On going  LONG TERM GOALS: Target date: 02/26/24.  Ind with an advanced HEP.  Goal status: INITIAL  2.  Active left knee flexion to 115 degrees+ so the patient can perform functional tasks and do so with pain not > 2-3/10.  Goal status: INITIAL  3.  Increase left hip and knee strength to a solid 4+/5 to provide good stability for accomplishment of functional activities.  Goal status: INITIAL  4.  Decrease edema to within 3 cms of non-affected side to assist with pain reduction and range of motion gains.  Goal status: INITIAL  5.  Perform a reciprocating stair gait with one railing with pain not > 2-3/10.  Goal status: INITIAL  6.  Improve LEFS score to 49-50 points. Baseline:  Goal status: INITIAL   PLAN:  PT FREQUENCY: 2x/week  PT DURATION: 6 weeks  PLANNED INTERVENTIONS: 97110-Therapeutic exercises, 97530- Therapeutic activity, [id number]- Neuromuscular re-education, 97535- Self Care, 02859- Manual therapy,  G0283- Electrical stimulation (unattended), 97016- Vasopneumatic device, Patient/Family education, Stair training, and Cryotherapy  PLAN FOR NEXT SESSION: Nustep, PROM.  Progress per TKA protocol.  LE elevation and vasopneumatic.     Zorion Nims,CHRIS, PTA 02/11/2024, 12:03 PM

## 2024-02-12 ENCOUNTER — Ambulatory Visit: Payer: Self-pay

## 2024-02-12 DIAGNOSIS — M7989 Other specified soft tissue disorders: Secondary | ICD-10-CM

## 2024-02-12 NOTE — Telephone Encounter (Signed)
 FYI Only or Action Required?: Action required by provider: referral request.  Patient was last seen in primary care on 11/18/2023 by Jolinda Norene HERO, DO.  Called Nurse Triage reporting Knee Pain.  Symptoms began several weeks ago.  Interventions attempted: Rest, hydration, or home remedies.  Symptoms are: unchanged.  Triage Disposition: See PCP When Office is Open (Within 3 Days)-needing a referral  Patient/caregiver understands and will follow disposition?: No, wishes to speak with PCP  Copied from CRM [id number]. Topic: Clinical - Red Word Triage >> Feb 12, 2024 12:14 PM Delon HERO wrote: Red Word that prompted transfer to Nurse Triage: Patient is calling to report swelling & pain in the left Knee. Patient reporting that she had knee surgery 3-4 years ago. Ankle no longer swells after surgery.  Patient requesting referral to [facility] because they handle lympodemia.   she rarely gets much edema on the right lower extremity.  She reports chronic swelling around the left knee. Reason for Disposition  [1] MODERATE pain (e.g., interferes with normal activities, limping) AND [2] present > 3 days  Answer Assessment - Initial Assessment Questions 1. LOCATION and RADIATION: Where is the pain located?      Left knee pain 2. QUALITY: What does the pain feel like?  (e.g., sharp, dull, aching, burning)     dull 3. SEVERITY: How bad is the pain? What does it keep you from doing?   (Scale 1-10; or mild, moderate, severe)     Mild-moderate 4. ONSET: When did the pain start? Does it come and go, or is it there all the time?     Post op pain 5. RECURRENT: Have you had this pain before? If Yes, ask: When, and what happened then?     yes 6. SETTING: Has there been any recent work, exercise or other activity that involved that part of the body?      no 7. AGGRAVATING FACTORS: What makes the knee pain worse? (e.g., walking, climbing stairs, running)      Trying to straighten 8. ASSOCIATED SYMPTOMS: Is there any swelling or redness of the knee?     Swelling to knee due to lymphedema  9. OTHER SYMPTOMS: Do you have any other symptoms? (e.g., calf pain, chest pain, difficulty breathing, fever)     Swelling to knee  Patient reports that she has lymphedema in her left knee where she has knee surgery. Patient was told to get a referral for a specialist in lymphedema. Patient is asking for referral sent to [facility] outpatient clin[facility] at [facility] because they see patient's with lymphedema. Patient states she is unable to straighten her left leg out due to the lymphedema. Patient would like a call once referral is sent.  Protocols used: Knee Pain-A-AH

## 2024-02-12 NOTE — Telephone Encounter (Signed)
 I don't see where this pt has a diagnosis of lymphedema, does she ntbs to discuss?

## 2024-02-15 NOTE — Addendum Note (Signed)
 Addended by: JOLINDA NORENE HERO on: 02/15/2024 07:19 AM   Modules accepted: Orders

## 2024-02-16 ENCOUNTER — Ambulatory Visit: Admitting: *Deleted

## 2024-02-16 DIAGNOSIS — M25662 Stiffness of left knee, not elsewhere classified: Secondary | ICD-10-CM

## 2024-02-16 DIAGNOSIS — M25562 Pain in left knee: Secondary | ICD-10-CM | POA: Diagnosis not present

## 2024-02-16 DIAGNOSIS — G8929 Other chronic pain: Secondary | ICD-10-CM | POA: Diagnosis not present

## 2024-02-16 DIAGNOSIS — R600 Localized edema: Secondary | ICD-10-CM | POA: Diagnosis not present

## 2024-02-16 DIAGNOSIS — M6281 Muscle weakness (generalized): Secondary | ICD-10-CM | POA: Diagnosis not present

## 2024-02-16 NOTE — Therapy (Addendum)
 OUTPATIENT PHYSICAL THERAPY LOWER EXTREMITY TREATMENT   Patient Name: Kristina Mclaughlin MRN: [id number] DOB:Nov 01, 1950, 73 y.o., female Today's Date: 02/16/2024  END OF SESSION:  PT End of Session - 02/16/24 1104     Visit Number 10    Number of Visits 12    Date for PT Re-Evaluation 02/26/24    PT Start Time 1100    PT Stop Time 1200    PT Time Calculation (min) 60 min           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, lateral back   Depression    Essential hypertension 10/29/2015   GERD (gastroesophageal reflux disease)    Hypertension    Insomnia    Migraine headache    Tear of medial meniscus of knee 09/06/2018   Past Surgical History:  Procedure Laterality Date   ABDOMINAL HYSTERECTOMY     BACK SURGERY     BREAST SURGERY     biopsy x2; reduction   EXAMINATION UNDER ANESTHESIA  08/27/2012   LIPOMA EXCISION Right 07/03/2021   Procedure: EXCISION LIPOMA; ANKLE;  Surgeon: Kallie Manuelita BROCKS, MD;  Location: [facility];  Service: General;  Laterality: Right;   LIPOMA EXCISION N/A 07/03/2021   Procedure: EXCISION LIPOMA; SUPRAPUBIC;  Surgeon: Kallie Manuelita BROCKS, MD;  Location: [facility];  Service: General;  Laterality: N/A;   MASS EXCISION Left 07/03/2021   Procedure: EXCISION LIPOMA; ANKLE;  Surgeon: Kallie Manuelita BROCKS, MD;  Location: [facility];  Service: General;  Laterality: Left;   MASS EXCISION Left 07/03/2021   Procedure: EXCISION OF LIPOMA; ARM;  Surgeon: Kallie Manuelita BROCKS, MD;  Location: [facility];  Service: General;  Laterality: Left;   NECK SURGERY     Mass removed   REDUCTION MAMMAPLASTY Bilateral 2002   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   XI ROBOTIC ASSISTED HIATAL HERNIA REPAIR N/A 11/18/2022   Procedure: ROBOTIC HIATAL HERNIA REPAIR;  Surgeon: Lyndel Deward PARAS, MD;  Location: [facility];  Service: General;  Laterality: N/A;   Patient Active Problem List   Diagnosis  Date Noted   Non-recurrent acute serous otitis media of left ear 05/11/2023   Hiatal hernia 11/18/2022   Gastroesophageal reflux disease 02/10/2022   Chronic superficial gastritis without bleeding 02/10/2022   Fatty liver 02/10/2022   Allergic rhinitis due to animal (cat) (dog) hair and dander 01/20/2022   Cough variant asthma 01/20/2022   Chronic constipation 09/18/2021   Neuropathic pain 08/22/2021   Lipoma of foot 06/11/2021   Lipoma of upper arm 06/11/2021   Sensorineural hearing loss (SNHL) of left ear with unrestricted hearing of right ear 12/11/2020   Lipoma of abdominal wall 12/15/2019   Breast lipoma 12/15/2019   Lipoma of both lower extremities 12/15/2019   Primary insomnia 05/11/2018   Unilateral primary osteoarthritis, left knee 05/11/2018   Hemorrhoids 01/22/2018   Essential hypertension 10/29/2015   Upper airway cough syndrome 10/20/2014    REFERRING PROVIDER: Dempsey Moan MD  REFERRING DIAG: Left total knee replacement  THERAPY DIAG:  Chronic pain of left knee  Localized edema  Stiffness of left knee, not elsewhere classified  Rationale for Evaluation and Treatment: Rehabilitation  ONSET DATE: Surgery date (01/12/24).  SUBJECTIVE:   SUBJECTIVE STATEMENT: Patient reports that her knee is doing better over all, but still with swelling. Up now steps with normal pattern, but not down  PERTINENT HISTORY: See above.  PAIN:  Are you having pain? Yes: NPRS scale: 3-4/10 Pain location: Left knee. Pain description: Sharp. Aggravating factors: Movement. Relieving factors: Not much.  PRECAUTIONS: Other: No ultrasound.    WEIGHT BEARING RESTRICTIONS: No  FALLS:  Has patient fallen in last 6 months? No  LIVING ENVIRONMENT: Lives with: lives with their spouse Lives in: House/apartment Stairs: 4-6.  Non-reciprocating.   Has following equipment at home: Vannie - 2 wheeled  PLOF: Independent  PATIENT GOALS: Perform ADL's without knee pain.     OBJECTIVE:   PATIENT SURVEYS:  LEFS: 13/50.   EDEMA:  Circumferential: 9 cms > on left than right.    PALPATION: C/o diffuse left knee pain.    LOWER EXTREMITY ROM:  In supine:  Left knee extension is -20 degrees and flexion assessed in the seated position to 85 degrees.  LOWER EXTREMITY MMT:  Patient currently unable to to perform a left antigravity SAQ and SLR.  GAIT: Step-to gait pattern with a FWW.                                                                                                                              TREATMENT DATE:    LT knee                                   02/16/24 EXERCISE LOG   LT knee  Exercise Repetitions and Resistance Comments  Nustep  L3 x 15 minutes @ seat 10-9,8   Bike X 10 mins L1-4  seat 9 Full revolutions  Rocker board  5 minutes    Wedge X 5 mins with quad sets for extension ROM   Standing HS stretch     6in step up    Squatting   BUE support from parallel bars  LAQ    LLE only   14in box lunge     Blank cell = exercise not performed today  Modalities: no redness or adverse reaction to today's modalities Manual PROM/STW   to LT knee for extension with long end-range holds  x 5 mins Date:  Vaso: Knee, 34 degrees; low pressure, 15 mins, Pain and Edema  01/28/24:  Nustep level 1 starting at seat 11,10, and progressing to seat 9 x 17 minutes  LAQs 2#  3x10,  HS curl   Red  tband   3 x10, 6in step up x20 Rocker board x 5 mins Manual STW to LT quad as well as HS's and posterior aspect.gentle low load long duration stretching into extension, but very sensitive posterior aspect today   LE elevation and vasopneumatic on low to patient's left knee x 15 minutes.     01/15/24:  Nustep on level 1 moving seated forward x 1 to increase knee flexion x 8 minutes f/b LE elevation and vasopneumatic on low to patient's left knee x 15 minutes.  PATIENT EDUCATION:  Education details: healing and expectations for soreness Person  educated: Patient Education method: Explanation Education comprehension: verbalized understanding  HOME EXERCISE PROGRAM:   ASSESSMENT:  CLINICAL IMPRESSION: Patient arrived today doing fairly well with RT knee. She reports doing a lot of walking over the weekend and working on ROM. She was able to progress to the bike today and was able to go full revolutions and did well. Pt is progressing towards LTG's and very close to meeting ROM goals.     OBJECTIVE IMPAIRMENTS: Abnormal gait, decreased activity tolerance, difficulty walking, decreased ROM, decreased strength, increased edema, and pain.   ACTIVITY LIMITATIONS: carrying, lifting, bending, standing, sleeping, stairs, transfers, and locomotion level  PARTICIPATION LIMITATIONS: meal prep, cleaning, laundry, driving, shopping, community activity, and yard work  PERSONAL FACTORS: Time since onset of injury/illness/exacerbation are also affecting patient's functional outcome.   REHAB POTENTIAL: Good  CLINICAL DECISION MAKING: Stable/uncomplicated  EVALUATION COMPLEXITY: Low   GOALS:  SHORT TERM GOALS: Target date: 01/29/24:  Ind with an initial HEP. Goal status: MET  2.  Full active left knee extension.  Goal status: On going   -5  LONG TERM GOALS: Target date: 02/26/24.  Ind with an advanced HEP.  Goal status: On going  2.  Active left knee flexion to 115 degrees+ so the patient can perform functional tasks and do so with pain not > 2-3/10.  Goal status: On going    110  3.  Increase left hip and knee strength to a solid 4+/5 to provide good stability for accomplishment of functional activities.  Goal status: On going   4/5  4.  Decrease edema to within 3 cms of non-affected side to assist with pain reduction and range of motion gains.  Goal status: On going  5.  Perform a reciprocating stair gait with one railing with pain not > 2-3/10.  Goal status: Partially MET   up not down  6.  Improve LEFS score to 49-50  points. Baseline:  Goal status: On going   PLAN:  PT FREQUENCY: 2x/week  PT DURATION: 6 weeks  PLANNED INTERVENTIONS: 97110-Therapeutic exercises, 97530- Therapeutic activity, [id number]- Neuromuscular re-education, 97535- Self Care, 02859- Manual therapy, G0283- Electrical stimulation (unattended), 97016- Vasopneumatic device, Patient/Family education, Stair training, and Cryotherapy  PLAN FOR NEXT SESSION: Nustep, PROM.  Progress per TKA protocol.  LE elevation and vasopneumatic.     Cayman Kielbasa,CHRIS, PTA 02/16/2024, 12:49 PM   Progress Note Reporting Period 01/15/24 to 02/16/24  See note below for Objective Data and Assessment of Progress/Goals. Patient progressing very well toward goals and was able to achieve forward revolutions on recumbent bike today.    Chad Applegate MPT

## 2024-02-17 DIAGNOSIS — J3081 Allergic rhinitis due to animal (cat) (dog) hair and dander: Secondary | ICD-10-CM | POA: Diagnosis not present

## 2024-02-17 DIAGNOSIS — J3089 Other allergic rhinitis: Secondary | ICD-10-CM | POA: Diagnosis not present

## 2024-02-17 DIAGNOSIS — J301 Allergic rhinitis due to pollen: Secondary | ICD-10-CM | POA: Diagnosis not present

## 2024-02-19 ENCOUNTER — Encounter: Payer: Self-pay | Admitting: Family Medicine

## 2024-02-19 ENCOUNTER — Ambulatory Visit: Admitting: Physical Therapy

## 2024-02-19 ENCOUNTER — Ambulatory Visit (INDEPENDENT_AMBULATORY_CARE_PROVIDER_SITE_OTHER): Admitting: Family Medicine

## 2024-02-19 VITALS — BP 113/79 | HR 77 | Ht <= 58 in | Wt 226.0 lb

## 2024-02-19 DIAGNOSIS — M25662 Stiffness of left knee, not elsewhere classified: Secondary | ICD-10-CM

## 2024-02-19 DIAGNOSIS — R600 Localized edema: Secondary | ICD-10-CM | POA: Diagnosis not present

## 2024-02-19 DIAGNOSIS — R399 Unspecified symptoms and signs involving the genitourinary system: Secondary | ICD-10-CM

## 2024-02-19 DIAGNOSIS — M6281 Muscle weakness (generalized): Secondary | ICD-10-CM | POA: Diagnosis not present

## 2024-02-19 DIAGNOSIS — G8929 Other chronic pain: Secondary | ICD-10-CM

## 2024-02-19 DIAGNOSIS — M25562 Pain in left knee: Secondary | ICD-10-CM | POA: Diagnosis not present

## 2024-02-19 DIAGNOSIS — R319 Hematuria, unspecified: Secondary | ICD-10-CM | POA: Diagnosis not present

## 2024-02-19 LAB — URINALYSIS, COMPLETE
Bilirubin, UA: NEGATIVE
Glucose, UA: NEGATIVE
Ketones, UA: NEGATIVE
Nitrite, UA: NEGATIVE
Protein,UA: NEGATIVE
Specific Gravity, UA: 1.01 (ref 1.005–1.030)
Urobilinogen, Ur: 0.2 mg/dL (ref 0.2–1.0)
pH, UA: 6 (ref 5.0–7.5)

## 2024-02-19 LAB — MICROSCOPIC EXAMINATION
Bacteria, UA: NONE SEEN
Renal Epithel, UA: NONE SEEN /HPF
Yeast, UA: NONE SEEN

## 2024-02-19 MED ORDER — CEPHALEXIN 500 MG PO CAPS: 500.0000 mg | ORAL_CAPSULE | Freq: Four times a day (QID) | ORAL | 0 refills | Status: DC

## 2024-02-19 NOTE — Progress Notes (Signed)
 BP 113/79   Pulse 77   Ht 2' (0.61 m)   Wt 226 lb (102.5 kg)   SpO2 95%   BMI 275.86 kg/m    Subjective:   Patient ID: Kristina Mclaughlin, female    DOB: February 23, 1951, 73 y.o.   MRN: [id number]  HPI: Kristina Mclaughlin is a 73 y.o. female presenting on 02/19/2024 for Urinary Tract Infection   HPI Dysuria and frequency and hematuria Patient has been having off-and-on dysuria and frequency and hematuria that is been going on over the past week or slightly more.  She says she has noticed some blood in her urine as well over the past few days off-and-on.  She says she has some urgency and burning along with the frequency.  She has gotten these off and on.  Relevant past medical, surgical, family and social history reviewed and updated as indicated. Interim medical history since our last visit reviewed. Allergies and medications reviewed and updated.  Review of Systems  Constitutional:  Negative for chills and fever.  Eyes:  Negative for visual disturbance.  Respiratory:  Negative for chest tightness and shortness of breath.   Cardiovascular:  Negative for chest pain and leg swelling.  Gastrointestinal:  Negative for abdominal pain.  Genitourinary:  Positive for dysuria, frequency, hematuria and urgency. Negative for difficulty urinating, flank pain, vaginal bleeding, vaginal discharge and vaginal pain.  Musculoskeletal:  Negative for back pain and gait problem.  Skin:  Negative for rash.  Neurological:  Negative for light-headedness and headaches.  Psychiatric/Behavioral:  Negative for agitation and behavioral problems.   All other systems reviewed and are negative.   Per HPI unless specifically indicated above   Allergies as of 02/19/2024       Reactions   Lovenox [enoxaparin Sodium] Anaphylaxis   Moxifloxacin Anaphylaxis, Other (See Comments)   Quinolones Hives, Shortness Of Breath   Mucinex  Sinus-max [phenylephrine -apap-guaifenesin ] Swelling   Red mucinex   tablet    Sulfamethoxazole-trimethoprim Other (See Comments)   Unknown reaction        Medication List        Accurate as of February 19, 2024  2:30 PM. If you have any questions, ask your nurse or doctor.          STOP taking these medications    Azelastine  HCl 137 MCG/SPRAY Soln Stopped by: [facility] Carlos Heber   benzonatate  200 MG capsule Commonly known as: TESSALON  Stopped by: [facility] Ermina Oberman   OXYCODONE  HCL PO Stopped by: [facility] Roberta Kelly       TAKE these medications    acetaminophen  500 MG tablet Commonly known as: TYLENOL  Take 1,000 mg by mouth every 6 (six) hours as needed for moderate pain.   albuterol  108 (90 Base) MCG/ACT inhaler Commonly known as: VENTOLIN  HFA TAKE 2 PUFFS BY MOUTH EVERY 6 HOURS AS NEEDED FOR WHEEZE OR SHORTNESS OF BREATH   atorvastatin  40 MG tablet Commonly known as: LIPITOR Take 1 tablet (40 mg total) by mouth daily.   Biotin 10 MG Caps Take 10 mg by mouth daily.   BLINK TEARS OP Place 1 drop into both eyes daily.   budesonide -formoterol  80-4.5 MCG/ACT inhaler Commonly known as: Symbicort  Take 2 puffs first thing in am and then another 2 puffs about 12 hours later.   CALCIUM  500 PO Take 500 mg by mouth daily.   cephALEXin  500 MG capsule Commonly known as: KEFLEX  Take 1 capsule (500 mg total) by mouth 4 (four) times daily. Started by: [facility] Shunna Mikaelian  diclofenac  Sodium 1 % Gel Commonly known as: Voltaren  Apply 2 g topically 4 (four) times daily as needed (arthritis).   EPINEPHrine  0.3 mg/0.3 mL Soaj injection Commonly known as: EPI-PEN Inject 0.3 mg into the muscle as needed for anaphylaxis.   fexofenadine  180 MG tablet Commonly known as: ALLEGRA  Take 1 tablet (180 mg total) by mouth daily. What changed: when to take this   fluticasone  50 MCG/ACT nasal spray Commonly known as: FLONASE  SPRAY 2 SPRAYS INTO EACH NOSTRIL EVERY DAY What changed: See the new instructions.   linaclotide  290 MCG Caps  capsule Commonly known as: Linzess  Take 1 capsule (290 mcg total) by mouth daily before breakfast.   Melatonin 5 MG Caps Take 5 mg by mouth at bedtime as needed (sleep).   methocarbamol  500 MG tablet Commonly known as: ROBAXIN  Take by mouth every 6 (six) hours as needed for muscle spasms.   MULTI-VITAMIN PO Take 1 tablet by mouth daily.   naproxen  500 MG tablet Commonly known as: NAPROSYN  Take 1 tablet (500 mg total) by mouth 2 (two) times daily.   PROBIOTIC PO Take 1 capsule by mouth daily.   SUMAtriptan  100 MG tablet Commonly known as: IMITREX  TAKE 1 TABLET AS NEED FOR MIGRAINE. MAY REPEAT IN 2 HOURS IF HEADACHE PERSISTS OR RECURS.   traMADol 50 MG tablet Commonly known as: ULTRAM Take 50 mg by mouth every 6 (six) hours as needed.   traZODone  150 MG tablet Commonly known as: DESYREL  Take 1 tablet (150 mg total) by mouth at bedtime as needed for sleep.   trospium  20 MG tablet Commonly known as: SANCTURA  Take 1 tablet (20 mg total) by mouth 2 (two) times daily.   Turmeric Curcumin 500 MG Caps Take 500 mg by mouth daily.   valsartan -hydrochlorothiazide  320-25 MG tablet Commonly known as: DIOVAN -HCT Take 1 tablet by mouth daily.   Vitamin D3 125 MCG (5000 UT) Caps Take 5,000 Units by mouth daily.         Objective:   BP 113/79   Pulse 77   Ht 2' (0.61 m)   Wt 226 lb (102.5 kg)   SpO2 95%   BMI 275.86 kg/m   Wt Readings from Last 3 Encounters:  02/19/24 226 lb (102.5 kg)  01/26/24 242 lb (109.8 kg)  11/18/23 242 lb (109.8 kg)    Physical Exam Vitals and nursing note reviewed.  Constitutional:      General: She is not in acute distress.    Appearance: She is well-developed. She is not diaphoretic.  Eyes:     Conjunctiva/sclera: Conjunctivae normal.  Cardiovascular:     Rate and Rhythm: Normal rate and regular rhythm.     Heart sounds: Normal heart sounds. No murmur heard. Pulmonary:     Effort: Pulmonary effort is normal. No respiratory  distress.     Breath sounds: Normal breath sounds. No wheezing.  Abdominal:     General: Bowel sounds are normal. There is no distension.     Palpations: Abdomen is soft. Abdomen is not rigid. There is no mass.     Tenderness: There is no abdominal tenderness. There is no guarding or rebound.  Skin:    General: Skin is warm and dry.     Findings: No rash.  Neurological:     Mental Status: She is alert and oriented to person, place, and time.     Coordination: Coordination normal.  Psychiatric:        Behavior: Behavior normal.     Urinalysis:  Trace blood and trace leukocytes and 0-5 WBCs and 0-2 RBCs  Assessment & Plan:   Problem List Items Addressed This Visit   None Visit Diagnoses       Hematuria, unspecified type    -  Primary   Relevant Medications   cephALEXin  (KEFLEX ) 500 MG capsule   Other Relevant Orders   Urinalysis, Complete   Urine Culture     UTI symptoms       Relevant Medications   cephALEXin  (KEFLEX ) 500 MG capsule       Will treat based on symptoms but if does not improve with antibiotic then she may need to go see urology. Follow up plan: Return if symptoms worsen or fail to improve.  Counseling provided for all of the vaccine components Orders Placed This Encounter  Procedures   Urine Culture   Urinalysis, Complete    Fonda Levins, MD [facility] 02/19/2024, 2:30 PM

## 2024-02-19 NOTE — Therapy (Signed)
 OUTPATIENT PHYSICAL THERAPY LOWER EXTREMITY TREATMENT   Patient Name: Kristina Mclaughlin MRN: [id number] DOB:1950/12/07, 73 y.o., female Today's Date: 02/19/2024  END OF SESSION:  PT End of Session - 02/19/24 1119     Visit Number 11    Number of Visits 12    Date for PT Re-Evaluation 02/26/24    PT Start Time 1100    PT Stop Time 1155    PT Time Calculation (min) 55 min    Activity Tolerance Patient tolerated treatment well    Behavior During Therapy [facility] for tasks assessed/performed            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, lateral back   Depression    Essential hypertension 10/29/2015   GERD (gastroesophageal reflux disease)    Hypertension    Insomnia    Migraine headache    Tear of medial meniscus of knee 09/06/2018   Past Surgical History:  Procedure Laterality Date   ABDOMINAL HYSTERECTOMY     BACK SURGERY     BREAST SURGERY     biopsy x2; reduction   EXAMINATION UNDER ANESTHESIA  08/27/2012   LIPOMA EXCISION Right 07/03/2021   Procedure: EXCISION LIPOMA; ANKLE;  Surgeon: Kallie Manuelita BROCKS, MD;  Location: [facility];  Service: General;  Laterality: Right;   LIPOMA EXCISION N/A 07/03/2021   Procedure: EXCISION LIPOMA; SUPRAPUBIC;  Surgeon: Kallie Manuelita BROCKS, MD;  Location: [facility];  Service: General;  Laterality: N/A;   MASS EXCISION Left 07/03/2021   Procedure: EXCISION LIPOMA; ANKLE;  Surgeon: Kallie Manuelita BROCKS, MD;  Location: [facility];  Service: General;  Laterality: Left;   MASS EXCISION Left 07/03/2021   Procedure: EXCISION OF LIPOMA; ARM;  Surgeon: Kallie Manuelita BROCKS, MD;  Location: [facility];  Service: General;  Laterality: Left;   NECK SURGERY     Mass removed   REDUCTION MAMMAPLASTY Bilateral 2002   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   XI ROBOTIC ASSISTED HIATAL HERNIA REPAIR N/A 11/18/2022   Procedure: ROBOTIC HIATAL HERNIA REPAIR;  Surgeon:  Lyndel Deward PARAS, MD;  Location: [facility];  Service: General;  Laterality: N/A;   Patient Active Problem List   Diagnosis Date Noted   Non-recurrent acute serous otitis media of left ear 05/11/2023   Hiatal hernia 11/18/2022   Gastroesophageal reflux disease 02/10/2022   Chronic superficial gastritis without bleeding 02/10/2022   Fatty liver 02/10/2022   Allergic rhinitis due to animal (cat) (dog) hair and dander 01/20/2022   Cough variant asthma 01/20/2022   Chronic constipation 09/18/2021   Neuropathic pain 08/22/2021   Lipoma of foot 06/11/2021   Lipoma of upper arm 06/11/2021   Sensorineural hearing loss (SNHL) of left ear with unrestricted hearing of right ear 12/11/2020   Lipoma of abdominal wall 12/15/2019   Breast lipoma 12/15/2019   Lipoma of both lower extremities 12/15/2019   Primary insomnia 05/11/2018   Unilateral primary osteoarthritis, left knee 05/11/2018   Hemorrhoids 01/22/2018   Essential hypertension 10/29/2015   Upper airway cough syndrome 10/20/2014    REFERRING PROVIDER: Dempsey Moan MD  REFERRING DIAG: Left total knee replacement  THERAPY DIAG:  Chronic pain of left knee  Localized edema  Stiffness of left knee, not elsewhere classified  Muscle weakness (generalized)  Rationale for Evaluation and Treatment: Rehabilitation  ONSET DATE: Surgery date (01/12/24).  SUBJECTIVE:   SUBJECTIVE STATEMENT: Doing well.  PERTINENT HISTORY: See above.   PAIN:  Are you having pain? Yes: NPRS scale: 3-4/10 Pain location: Left knee. Pain description: Sharp. Aggravating factors: Movement. Relieving factors: Not much.  PRECAUTIONS: Other: No ultrasound.    WEIGHT BEARING RESTRICTIONS: No  FALLS:  Has patient fallen in last 6 months? No  LIVING ENVIRONMENT: Lives with: lives with their spouse Lives in: House/apartment Stairs: 4-6.  Non-reciprocating.   Has following equipment at home: Vannie - 2 wheeled  PLOF: Independent  PATIENT  GOALS: Perform ADL's without knee pain.    OBJECTIVE:   PATIENT SURVEYS:  LEFS: 13/50.   EDEMA:  Circumferential: 9 cms > on left than right.    PALPATION: C/o diffuse left knee pain.    LOWER EXTREMITY ROM:  In supine:  Left knee extension is -20 degrees and flexion assessed in the seated position to 85 degrees.  LOWER EXTREMITY MMT:  Patient currently unable to to perform a left antigravity SAQ and SLR.  GAIT: Step-to gait pattern with a FWW.                                                                                                                              TREATMENT DATE:      02/19/24:                                     EXERCISE LOG  Exercise Repetitions and Resistance Comments  Recumbent bike Progressing to seat 8 and level 5 over 15 minutes   Knee ext 10# x 3 minutes   Ham curls 30# x 3 minutes            In supine:  LLLDS left knee extension stretch/ham stretch x 6 minutes f/b LE elevation and vasopneumatic on medium x 20 minutes.      LT knee                                   02/16/24 EXERCISE LOG   LT knee  Exercise Repetitions and Resistance Comments  Nustep  L3 x 15 minutes @ seat 10-9,8   Bike X 10 mins L1-4  seat 9 Full revolutions  Rocker board  5 minutes    Wedge X 5 mins with quad sets for extension ROM   Standing HS stretch     6in step up    Squatting   BUE support from parallel bars  LAQ    LLE only   14in box lunge     Blank cell = exercise not performed today  Modalities: no redness or adverse reaction to today's modalities Manual PROM/STW   to LT knee for extension with long end-range holds  x 5 mins Date:  Vaso: Knee, 34 degrees; low pressure, 15 mins, Pain and Edema    PATIENT EDUCATION:  Education details: healing and expectations for soreness Person educated: Patient Education method: Explanation Education comprehension: verbalized understanding  HOME EXERCISE PROGRAM:   ASSESSMENT:  CLINICAL  IMPRESSION: Patient progressed to seat 8 on recumbent bike and performed circuit weight exercises wit excellent technique and without complaint.     OBJECTIVE IMPAIRMENTS: Abnormal gait, decreased activity tolerance, difficulty walking, decreased ROM, decreased strength, increased edema, and pain.   ACTIVITY LIMITATIONS: carrying, lifting, bending, standing, sleeping, stairs, transfers, and locomotion level  PARTICIPATION LIMITATIONS: meal prep, cleaning, laundry, driving, shopping, community activity, and yard work  PERSONAL FACTORS: Time since onset of injury/illness/exacerbation are also affecting patient's functional outcome.   REHAB POTENTIAL: Good  CLINICAL DECISION MAKING: Stable/uncomplicated  EVALUATION COMPLEXITY: Low   GOALS:  SHORT TERM GOALS: Target date: 01/29/24:  Ind with an initial HEP. Goal status: MET  2.  Full active left knee extension.  Goal status: On going   -5  LONG TERM GOALS: Target date: 02/26/24.  Ind with an advanced HEP.  Goal status: On going  2.  Active left knee flexion to 115 degrees+ so the patient can perform functional tasks and do so with pain not > 2-3/10.  Goal status: On going    110  3.  Increase left hip and knee strength to a solid 4+/5 to provide good stability for accomplishment of functional activities.  Goal status: On going   4/5  4.  Decrease edema to within 3 cms of non-affected side to assist with pain reduction and range of motion gains.  Goal status: On going  5.  Perform a reciprocating stair gait with one railing with pain not > 2-3/10.  Goal status: Partially MET   up not down  6.  Improve LEFS score to 49-50 points. Baseline:  Goal status: On going   PLAN:  PT FREQUENCY: 2x/week  PT DURATION: 6 weeks  PLANNED INTERVENTIONS: 97110-Therapeutic exercises, 97530- Therapeutic activity, [id number]- Neuromuscular re-education, 97535- Self Care, 02859- Manual therapy, G0283- Electrical stimulation (unattended), 97016-  Vasopneumatic device, Patient/Family education, Stair training, and Cryotherapy  PLAN FOR NEXT SESSION: Nustep, PROM.  Progress per TKA protocol.  LE elevation and vasopneumatic.     Artesha Wemhoff, [place], PT 02/19/2024, 12:42 PM   Progress Note Reporting Period 01/15/24 to 02/16/24  See note below for Objective Data and Assessment of Progress/Goals. Patient progressing very well toward goals and was able to achieve forward revolutions on recumbent bike today.    Lyndel Sarate MPT

## 2024-02-21 LAB — URINE CULTURE

## 2024-02-22 ENCOUNTER — Ambulatory Visit: Admitting: Physical Therapy

## 2024-02-22 ENCOUNTER — Ambulatory Visit: Payer: Self-pay | Admitting: Family Medicine

## 2024-02-22 DIAGNOSIS — R600 Localized edema: Secondary | ICD-10-CM | POA: Diagnosis not present

## 2024-02-22 DIAGNOSIS — G8929 Other chronic pain: Secondary | ICD-10-CM | POA: Diagnosis not present

## 2024-02-22 DIAGNOSIS — M6281 Muscle weakness (generalized): Secondary | ICD-10-CM | POA: Diagnosis not present

## 2024-02-22 DIAGNOSIS — M25562 Pain in left knee: Secondary | ICD-10-CM | POA: Diagnosis not present

## 2024-02-22 DIAGNOSIS — M25662 Stiffness of left knee, not elsewhere classified: Secondary | ICD-10-CM

## 2024-02-22 NOTE — Therapy (Addendum)
 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, lateral back   Depression    Essential hypertension 10/29/2015   GERD (gastroesophageal reflux disease)    Hypertension    Insomnia    Migraine headache    Tear of medial meniscus of knee 09/06/2018   Past Surgical History:  Procedure Laterality Date   ABDOMINAL HYSTERECTOMY     BACK SURGERY     BREAST SURGERY     biopsy x2; reduction   EXAMINATION UNDER ANESTHESIA  08/27/2012   LIPOMA EXCISION Right 07/03/2021   Procedure: EXCISION LIPOMA; ANKLE;  Surgeon: Kallie Manuelita BROCKS, MD;  Location: [facility];  Service: General;  Laterality: Right;   LIPOMA EXCISION N/A 07/03/2021   Procedure: EXCISION LIPOMA; SUPRAPUBIC;  Surgeon: Kallie Manuelita BROCKS, MD;  Location: [facility];  Service: General;  Laterality: N/A;   MASS EXCISION Left 07/03/2021   Procedure: EXCISION LIPOMA; ANKLE;  Surgeon: Kallie Manuelita BROCKS, MD;  Location: [facility];  Service: General;  Laterality: Left;   MASS EXCISION Left 07/03/2021   Procedure: EXCISION OF LIPOMA; ARM;  Surgeon: Kallie Manuelita BROCKS, MD;  Location: [facility];  Service: General;  Laterality: Left;   NECK SURGERY     Mass removed   REDUCTION MAMMAPLASTY Bilateral 2002   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   XI ROBOTIC ASSISTED HIATAL HERNIA REPAIR N/A 11/18/2022   Procedure: ROBOTIC HIATAL HERNIA REPAIR;  Surgeon:  Lyndel Deward PARAS, MD;  Location: [facility];  Service: General;  Laterality: N/A;   Patient Active Problem List   Diagnosis Date Noted   Non-recurrent acute serous otitis media of left ear 05/11/2023   Hiatal hernia 11/18/2022   Gastroesophageal reflux disease 02/10/2022   Chronic superficial gastritis without bleeding 02/10/2022   Fatty liver 02/10/2022   Allergic rhinitis due to animal (cat) (dog) hair and dander 01/20/2022   Cough variant asthma 01/20/2022   Chronic constipation 09/18/2021   Neuropathic pain 08/22/2021   Lipoma of foot 06/11/2021   Lipoma of upper arm 06/11/2021   Sensorineural hearing loss (SNHL) of left ear with unrestricted hearing of right ear 12/11/2020   Lipoma of abdominal wall 12/15/2019   Breast lipoma 12/15/2019   Lipoma of both lower extremities 12/15/2019   Primary insomnia 05/11/2018   Unilateral primary osteoarthritis, left knee 05/11/2018   Hemorrhoids 01/22/2018   Essential hypertension 10/29/2015   Upper airway cough syndrome 10/20/2014    REFERRING PROVIDER: Dempsey Moan MD  REFERRING DIAG: Left total knee replacement  THERAPY DIAG:  Chronic pain of left knee  Localized edema  Stiffness of left knee, not elsewhere classified  Muscle weakness (generalized)  Rationale for Evaluation and Treatment: Rehabilitation  ONSET DATE: Surgery date (01/12/24).  SUBJECTIVE:   SUBJECTIVE STATEMENT: Doing well.  PERTINENT HISTORY: See above.   PAIN:  Are you having pain? Yes: NPRS scale: 3-4/10 Pain location: Left knee. Pain description: Sharp. Aggravating factors: Movement. Relieving factors: Not much.  PRECAUTIONS: Other: No ultrasound.    WEIGHT BEARING RESTRICTIONS: No  FALLS:  Has patient fallen in last 6 months? No  LIVING ENVIRONMENT: Lives with: lives with their spouse Lives in: House/apartment Stairs: 4-6.  Non-reciprocating.   Has following equipment at home: Vannie - 2 wheeled  PLOF: Independent  PATIENT  GOALS: Perform ADL's without knee pain.    OBJECTIVE:   PATIENT SURVEYS:  LEFS: 13/50.   EDEMA:  Circumferential: 9 cms > on left than right.    PALPATION: C/o diffuse left knee pain.    LOWER EXTREMITY ROM:  In supine:  Left knee extension is -20 degrees and flexion assessed in the seated position to 85 degrees.  02/22/24:  Active left knee flexion to 115 degrees.  LOWER EXTREMITY MMT:  Patient currently unable to to perform a left antigravity SAQ and SLR.  GAIT: Step-to gait pattern with a FWW.                                                                                                                              TREATMENT DATE:       02/22/24:                                     EXERCISE LOG  Exercise Repetitions and Resistance Comments  Recumbent bike Progressing to seat 7 over 15 minutes.   Knee ext 10# x 3 minutes   Ham curls 40# x 3 minutes   Leg Press  2 plates x 3 minutes        LLLDS x 5 minutes for left knee extension f/b LE elevation and vasopneumatic on medium x 20 minutes.    02/19/24:                                     EXERCISE LOG  Exercise Repetitions and Resistance Comments  Recumbent bike Progressing to seat 8 and level 5 over 15 minutes   Knee ext 10# x 3 minutes   Ham curls 30# x 3 minutes            In supine:  LLLDS left knee extension stretch/ham stretch x 6 minutes f/b LE elevation and vasopneumatic on medium x 20 minutes.      LT knee                                   02/16/24 EXERCISE LOG   LT knee  Exercise Repetitions and Resistance Comments  Nustep  L3 x 15 minutes @ seat 10-9,8  Bike X 10 mins L1-4  seat 9 Full revolutions  Rocker board  5 minutes    Wedge X 5 mins with quad sets for extension ROM   Standing HS stretch     6in step up    Squatting   BUE support from parallel bars  LAQ    LLE only   14in box lunge     Blank cell = exercise not performed today  Modalities: no redness or adverse reaction to  today's modalities Manual PROM/STW   to LT knee for extension with long end-range holds  x 5 mins Date:  Vaso: Knee, 34 degrees; low pressure, 15 mins, Pain and Edema    PATIENT EDUCATION:  Education details: healing and expectations for soreness Person educated: Patient Education method: Explanation Education comprehension: verbalized understanding  HOME EXERCISE PROGRAM:   ASSESSMENT:  CLINICAL IMPRESSION: Patient progressing well.  Added leg press which patient performed with excellent technique and no complaints.  Active left knee flexion to 115 degrees today.     OBJECTIVE IMPAIRMENTS: Abnormal gait, decreased activity tolerance, difficulty walking, decreased ROM, decreased strength, increased edema, and pain.   ACTIVITY LIMITATIONS: carrying, lifting, bending, standing, sleeping, stairs, transfers, and locomotion level  PARTICIPATION LIMITATIONS: meal prep, cleaning, laundry, driving, shopping, community activity, and yard work  PERSONAL FACTORS: Time since onset of injury/illness/exacerbation are also affecting patient's functional outcome.   REHAB POTENTIAL: Good  CLINICAL DECISION MAKING: Stable/uncomplicated  EVALUATION COMPLEXITY: Low   GOALS:  SHORT TERM GOALS: Target date: 01/29/24:  Ind with an initial HEP. Goal status: MET  2.  Full active left knee extension.  Goal status: On going   -5  LONG TERM GOALS: Target date: 02/26/24.  Ind with an advanced HEP.  Goal status: On going  2.  Active left knee flexion to 115 degrees+ so the patient can perform functional tasks and do so with pain not > 2-3/10.  Goal status: On going    110  3.  Increase left hip and knee strength to a solid 4+/5 to provide good stability for accomplishment of functional activities.  Goal status: On going   4/5  4.  Decrease edema to within 3 cms of non-affected side to assist with pain reduction and range of motion gains.  Goal status: On going  5.  Perform a reciprocating  stair gait with one railing with pain not > 2-3/10.  Goal status: Partially MET   up not down  6.  Improve LEFS score to 49-50 points. Baseline:  Goal status: On going   PLAN:  PT FREQUENCY: 2x/week  PT DURATION: 6 weeks  PLANNED INTERVENTIONS: 97110-Therapeutic exercises, 97530- Therapeutic activity, [id number]- Neuromuscular re-education, 97535- Self Care, [id number]- Manual therapy, G0283- Electrical stimulation (unattended), 97016- Vasopneumatic device, Patient/Family education, Stair training, and Cryotherapy  PLAN FOR NEXT SESSION: Nustep, PROM.  Progress per TKA protocol.  LE elevation and vasopneumatic.     Karry Causer, [place], PT 02/22/2024, 12:14 PM

## 2024-02-24 DIAGNOSIS — J3081 Allergic rhinitis due to animal (cat) (dog) hair and dander: Secondary | ICD-10-CM | POA: Diagnosis not present

## 2024-02-24 DIAGNOSIS — J3089 Other allergic rhinitis: Secondary | ICD-10-CM | POA: Diagnosis not present

## 2024-02-24 DIAGNOSIS — J301 Allergic rhinitis due to pollen: Secondary | ICD-10-CM | POA: Diagnosis not present

## 2024-02-25 ENCOUNTER — Ambulatory Visit: Admitting: *Deleted

## 2024-02-25 ENCOUNTER — Encounter: Payer: Self-pay | Admitting: *Deleted

## 2024-02-25 DIAGNOSIS — R600 Localized edema: Secondary | ICD-10-CM | POA: Diagnosis not present

## 2024-02-25 DIAGNOSIS — M25662 Stiffness of left knee, not elsewhere classified: Secondary | ICD-10-CM | POA: Diagnosis not present

## 2024-02-25 DIAGNOSIS — M6281 Muscle weakness (generalized): Secondary | ICD-10-CM | POA: Diagnosis not present

## 2024-02-25 DIAGNOSIS — G8929 Other chronic pain: Secondary | ICD-10-CM

## 2024-02-25 DIAGNOSIS — M25562 Pain in left knee: Secondary | ICD-10-CM | POA: Diagnosis not present

## 2024-02-25 NOTE — Therapy (Signed)
 OUTPATIENT PHYSICAL THERAPY LOWER EXTREMITY TREATMENT   Patient Name: Kristina Mclaughlin MRN: [id number] DOB:Jul 03, 1951, 73 y.o., female Today's Date: 02/25/2024  END OF SESSION:  PT End of Session - 02/25/24 1105     Visit Number 13    Number of Visits 18    Date for PT Re-Evaluation 03/18/24    PT Start Time 1100    PT Stop Time 1203    PT Time Calculation (min) 63 min             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, lateral back   Depression    Essential hypertension 10/29/2015   GERD (gastroesophageal reflux disease)    Hypertension    Insomnia    Migraine headache    Tear of medial meniscus of knee 09/06/2018   Past Surgical History:  Procedure Laterality Date   ABDOMINAL HYSTERECTOMY     BACK SURGERY     BREAST SURGERY     biopsy x2; reduction   EXAMINATION UNDER ANESTHESIA  08/27/2012   LIPOMA EXCISION Right 07/03/2021   Procedure: EXCISION LIPOMA; ANKLE;  Surgeon: Kallie Manuelita BROCKS, MD;  Location: [facility];  Service: General;  Laterality: Right;   LIPOMA EXCISION N/A 07/03/2021   Procedure: EXCISION LIPOMA; SUPRAPUBIC;  Surgeon: Kallie Manuelita BROCKS, MD;  Location: [facility];  Service: General;  Laterality: N/A;   MASS EXCISION Left 07/03/2021   Procedure: EXCISION LIPOMA; ANKLE;  Surgeon: Kallie Manuelita BROCKS, MD;  Location: [facility];  Service: General;  Laterality: Left;   MASS EXCISION Left 07/03/2021   Procedure: EXCISION OF LIPOMA; ARM;  Surgeon: Kallie Manuelita BROCKS, MD;  Location: [facility];  Service: General;  Laterality: Left;   NECK SURGERY     Mass removed   REDUCTION MAMMAPLASTY Bilateral 2002   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   XI ROBOTIC ASSISTED HIATAL HERNIA REPAIR N/A 11/18/2022   Procedure: ROBOTIC HIATAL HERNIA REPAIR;  Surgeon: Lyndel Deward PARAS, MD;  Location: [facility];  Service: General;  Laterality: N/A;   Patient Active Problem List    Diagnosis Date Noted   Non-recurrent acute serous otitis media of left ear 05/11/2023   Hiatal hernia 11/18/2022   Gastroesophageal reflux disease 02/10/2022   Chronic superficial gastritis without bleeding 02/10/2022   Fatty liver 02/10/2022   Allergic rhinitis due to animal (cat) (dog) hair and dander 01/20/2022   Cough variant asthma 01/20/2022   Chronic constipation 09/18/2021   Neuropathic pain 08/22/2021   Lipoma of foot 06/11/2021   Lipoma of upper arm 06/11/2021   Sensorineural hearing loss (SNHL) of left ear with unrestricted hearing of right ear 12/11/2020   Lipoma of abdominal wall 12/15/2019   Breast lipoma 12/15/2019   Lipoma of both lower extremities 12/15/2019   Primary insomnia 05/11/2018   Unilateral primary osteoarthritis, left knee 05/11/2018   Hemorrhoids 01/22/2018   Essential hypertension 10/29/2015   Upper airway cough syndrome 10/20/2014    REFERRING PROVIDER: Dempsey Moan MD  REFERRING DIAG: Left total knee replacement  THERAPY DIAG:  Chronic pain of left knee  Localized edema  Stiffness of left knee, not elsewhere classified  Muscle weakness (generalized)  Rationale for Evaluation and Treatment: Rehabilitation  ONSET DATE: Surgery date (01/12/24).  SUBJECTIVE:   SUBJECTIVE STATEMENT: Doing better with LT knee and having lymphedema consult in August  PERTINENT HISTORY: See above.   PAIN:  Are you having  pain? Yes: NPRS scale: 3-4/10 Pain location: Left knee. Pain description: Sharp. Aggravating factors: Movement. Relieving factors: Not much.  PRECAUTIONS: Other: No ultrasound.    WEIGHT BEARING RESTRICTIONS: No  FALLS:  Has patient fallen in last 6 months? No  LIVING ENVIRONMENT: Lives with: lives with their spouse Lives in: House/apartment Stairs: 4-6.  Non-reciprocating.   Has following equipment at home: Vannie - 2 wheeled  PLOF: Independent  PATIENT GOALS: Perform ADL's without knee pain.    OBJECTIVE:   PATIENT  SURVEYS:  LEFS: 13/50.   EDEMA:  Circumferential: 9 cms > on left than right.    PALPATION: C/o diffuse left knee pain.    LOWER EXTREMITY ROM:  In supine:  Left knee extension is -20 degrees and flexion assessed in the seated position to 85 degrees.  02/22/24:  Active left knee flexion to 115 degrees.  LOWER EXTREMITY MMT:  Patient currently unable to to perform a left antigravity SAQ and SLR.  GAIT: Step-to gait pattern with a FWW.                                                                                                                              TREATMENT DATE:       02/22/24:                                     EXERCISE LOG    LT knee  Exercise Repetitions and Resistance Comments  Recumbent bike Progressing to seat 7,6    x  15 minutes.   Knee ext 10# x 3 minutes    pause at top   Ham curls 40# x 4 minutes     Leg Press  2 plates x 4 minutes    XTS blue Reversed TKE for ext stretch  x 10 quad sets with holds   LLLDS x 5 minutes for left knee extension  LE elevation and vasopneumatic on medium x .    02/19/24:                                     EXERCISE LOG  Exercise Repetitions and Resistance Comments  Recumbent bike Progressing to seat 8 and level 5 over 15 minutes   Knee ext 10# x 3 minutes   Ham curls 30# x 3 minutes            In supine:  LLLDS left knee extension stretch/ham stretch x 6 minutes f/b LE elevation and vasopneumatic on medium x 20 minutes.      LT knee                                   02/16/24 EXERCISE LOG   LT knee  Exercise Repetitions and Resistance Comments  Nustep  L3 x 15 minutes @ seat 10-9,8   Bike X 10 mins L1-4  seat 9 Full revolutions  Rocker board  5 minutes    Wedge X 5 mins with quad sets for extension ROM   Standing HS stretch     6in step up    Squatting   BUE support from parallel bars  LAQ    LLE only   14in box lunge     Blank cell = exercise not performed today  Modalities: no redness or  adverse reaction to today's modalities Manual PROM/STW   to LT knee for extension with long end-range holds  x 5 mins Date:  Vaso: Knee, 34 degrees; low pressure, 15 mins, Pain and Edema    PATIENT EDUCATION:  Education details: healing and expectations for soreness Person educated: Patient Education method: Explanation Education comprehension: verbalized understanding  HOME EXERCISE PROGRAM:   ASSESSMENT:  CLINICAL IMPRESSION: Patient arrived today doing fairly well and reports having a lymphedema appt in Aug. Pt was able to continue with LT knee strengthening as well as ROM progression exs f/b manual PROM for ext. Vaso end of session.        OBJECTIVE IMPAIRMENTS: Abnormal gait, decreased activity tolerance, difficulty walking, decreased ROM, decreased strength, increased edema, and pain.   ACTIVITY LIMITATIONS: carrying, lifting, bending, standing, sleeping, stairs, transfers, and locomotion level  PARTICIPATION LIMITATIONS: meal prep, cleaning, laundry, driving, shopping, community activity, and yard work  PERSONAL FACTORS: Time since onset of injury/illness/exacerbation are also affecting patient's functional outcome.   REHAB POTENTIAL: Good  CLINICAL DECISION MAKING: Stable/uncomplicated  EVALUATION COMPLEXITY: Low   GOALS:  SHORT TERM GOALS: Target date: 01/29/24:  Ind with an initial HEP. Goal status: MET  2.  Full active left knee extension.  Goal status: On going   -5  LONG TERM GOALS: Target date: 02/26/24.  Ind with an advanced HEP.  Goal status: On going  2.  Active left knee flexion to 115 degrees+ so the patient can perform functional tasks and do so with pain not > 2-3/10.  Goal status: MET  3.  Increase left hip and knee strength to a solid 4+/5 to provide good stability for accomplishment of functional activities.  Goal status: On going   4/5  4.  Decrease edema to within 3 cms of non-affected side to assist with pain reduction and range of  motion gains.  Goal status: On going  5.  Perform a reciprocating stair gait with one railing with pain not > 2-3/10.  Goal status: MET     6.  Improve LEFS score to 49-50 points. Baseline:  Goal status: On going   PLAN:  PT FREQUENCY: 2x/week  PT DURATION: 6 weeks  PLANNED INTERVENTIONS: 97110-Therapeutic exercises, 97530- Therapeutic activity, [id number]- Neuromuscular re-education, 97535- Self Care, 02859- Manual therapy, G0283- Electrical stimulation (unattended), 97016- Vasopneumatic device, Patient/Family education, Stair training, and Cryotherapy  PLAN FOR NEXT SESSION: Nustep, PROM.  Progress per TKA protocol.  LE elevation and vasopneumatic.     Hopie Pellegrin,[place], PTA 02/25/2024, 12:03 PM

## 2024-02-29 ENCOUNTER — Ambulatory Visit: Admitting: Physical Therapy

## 2024-02-29 ENCOUNTER — Encounter: Payer: Self-pay | Admitting: Physical Therapy

## 2024-02-29 DIAGNOSIS — Z5189 Encounter for other specified aftercare: Secondary | ICD-10-CM | POA: Diagnosis not present

## 2024-02-29 DIAGNOSIS — M6281 Muscle weakness (generalized): Secondary | ICD-10-CM | POA: Diagnosis not present

## 2024-02-29 DIAGNOSIS — R600 Localized edema: Secondary | ICD-10-CM

## 2024-02-29 DIAGNOSIS — G8929 Other chronic pain: Secondary | ICD-10-CM

## 2024-02-29 DIAGNOSIS — M25562 Pain in left knee: Secondary | ICD-10-CM | POA: Diagnosis not present

## 2024-02-29 DIAGNOSIS — M25662 Stiffness of left knee, not elsewhere classified: Secondary | ICD-10-CM | POA: Diagnosis not present

## 2024-02-29 NOTE — Therapy (Signed)
 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, lateral back   Depression    Essential hypertension 10/29/2015   GERD (gastroesophageal reflux disease)    Hypertension    Insomnia    Migraine headache    Tear of medial meniscus of knee 09/06/2018   Past Surgical History:  Procedure Laterality Date   ABDOMINAL HYSTERECTOMY     BACK SURGERY     BREAST SURGERY     biopsy x2; reduction   EXAMINATION UNDER ANESTHESIA  08/27/2012   LIPOMA EXCISION Right 07/03/2021   Procedure: EXCISION LIPOMA; ANKLE;  Surgeon: Kallie Manuelita BROCKS, MD;  Location: [facility];  Service: General;  Laterality: Right;   LIPOMA EXCISION N/A 07/03/2021   Procedure: EXCISION LIPOMA; SUPRAPUBIC;  Surgeon: Kallie Manuelita BROCKS, MD;  Location: [facility];  Service: General;  Laterality: N/A;   MASS EXCISION Left 07/03/2021   Procedure: EXCISION LIPOMA; ANKLE;  Surgeon: Kallie Manuelita BROCKS, MD;  Location: [facility];  Service: General;  Laterality: Left;   MASS EXCISION Left 07/03/2021   Procedure: EXCISION OF LIPOMA; ARM;  Surgeon: Kallie Manuelita BROCKS, MD;  Location: [facility];  Service: General;  Laterality: Left;   NECK SURGERY     Mass removed   REDUCTION MAMMAPLASTY Bilateral 2002   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   XI ROBOTIC ASSISTED HIATAL HERNIA REPAIR N/A 11/18/2022   Procedure: ROBOTIC HIATAL HERNIA REPAIR;  Surgeon:  Lyndel Deward PARAS, MD;  Location: [facility];  Service: General;  Laterality: N/A;   Patient Active Problem List   Diagnosis Date Noted   Non-recurrent acute serous otitis media of left ear 05/11/2023   Hiatal hernia 11/18/2022   Gastroesophageal reflux disease 02/10/2022   Chronic superficial gastritis without bleeding 02/10/2022   Fatty liver 02/10/2022   Allergic rhinitis due to animal (cat) (dog) hair and dander 01/20/2022   Cough variant asthma 01/20/2022   Chronic constipation 09/18/2021   Neuropathic pain 08/22/2021   Lipoma of foot 06/11/2021   Lipoma of upper arm 06/11/2021   Sensorineural hearing loss (SNHL) of left ear with unrestricted hearing of right ear 12/11/2020   Lipoma of abdominal wall 12/15/2019   Breast lipoma 12/15/2019   Lipoma of both lower extremities 12/15/2019   Primary insomnia 05/11/2018   Unilateral primary osteoarthritis, left knee 05/11/2018   Hemorrhoids 01/22/2018   Essential hypertension 10/29/2015   Upper airway cough syndrome 10/20/2014    REFERRING PROVIDER: Dempsey Moan MD  REFERRING DIAG: Left total knee replacement  THERAPY DIAG:  Chronic pain of left knee  Localized edema  Stiffness of left knee, not elsewhere classified  Muscle weakness (generalized)  Rationale for Evaluation and Treatment: Rehabilitation  ONSET DATE: Surgery date (01/12/24).  SUBJECTIVE:   SUBJECTIVE STATEMENT: Went back for doctor  visit and they were pleased with her bend.  She is going to be measured for a JAS to work on extension.   PERTINENT HISTORY: See above.   PAIN:  Are you having pain? Yes: NPRS scale: 3-4/10 Pain location: Left knee. Pain description: Sharp. Aggravating factors: Movement. Relieving factors: Not much.  PRECAUTIONS: Other: No ultrasound.    WEIGHT BEARING RESTRICTIONS: No  FALLS:  Has patient fallen in last 6 months? No  LIVING ENVIRONMENT: Lives with: lives with their spouse Lives in: House/apartment Stairs: 4-6.   Non-reciprocating.   Has following equipment at home: Vannie - 2 wheeled  PLOF: Independent  PATIENT GOALS: Perform ADL's without knee pain.    OBJECTIVE:   PATIENT SURVEYS:  LEFS: 13/50.   EDEMA:  Circumferential: 9 cms > on left than right.    PALPATION: C/o diffuse left knee pain.    LOWER EXTREMITY ROM:  In supine:  Left knee extension is -20 degrees and flexion assessed in the seated position to 85 degrees.  02/22/24:  Active left knee flexion to 115 degrees.  LOWER EXTREMITY MMT:  Patient currently unable to to perform a left antigravity SAQ and SLR.  GAIT: Step-to gait pattern with a FWW.                                                                                                                              TREATMENT DATE:     02/29/24:                                     EXERCISE LOG  Exercise Repetitions and Resistance Comments  Recumbent bike  Seat 6 x 15 minutes   Knee ext 20# x 3 minutes   Ham curls 40# x 3 minutes   Leg Press  2 plates x 3 minutes       Sustained 4 minutes left knee extension stretch.  LE elevation and vasopneumatic on low x 10 minutes.   02/22/24:                                     EXERCISE LOG    LT knee  Exercise Repetitions and Resistance Comments  Recumbent bike Progressing to seat 7,6    x  15 minutes.   Knee ext 10# x 3 minutes    pause at top   Ham curls 40# x 4 minutes     Leg Press  2 plates x 4 minutes    XTS blue Reversed TKE for ext stretch  x 10 quad sets with holds   LLLDS x 5 minutes for left knee extension  LE elevation and vasopneumatic on medium x .    02/19/24:  EXERCISE LOG  Exercise Repetitions and Resistance Comments  Recumbent bike Progressing to seat 8 and level 5 over 15 minutes   Knee ext 10# x 3 minutes   Ham curls 30# x 3 minutes            In supine:  LLLDS left knee extension stretch/ham stretch x 6 minutes f/b LE elevation and  vasopneumatic on medium x 20 minutes.      LT knee                                   02/16/24 EXERCISE LOG   LT knee  Exercise Repetitions and Resistance Comments  Nustep  L3 x 15 minutes @ seat 10-9,8   Bike X 10 mins L1-4  seat 9 Full revolutions  Rocker board  5 minutes    Wedge X 5 mins with quad sets for extension ROM   Standing HS stretch     6in step up    Squatting   BUE support from parallel bars  LAQ    LLE only   14in box lunge     Blank cell = exercise not performed today  Modalities: no redness or adverse reaction to today's modalities Manual PROM/STW   to LT knee for extension with long end-range holds  x 5 mins Date:  Vaso: Knee, 34 degrees; low pressure, 15 mins, Pain and Edema    PATIENT EDUCATION:  Education details: healing and expectations for soreness Person educated: Patient Education method: Explanation Education comprehension: verbalized understanding  HOME EXERCISE PROGRAM:   ASSESSMENT:  CLINICAL IMPRESSION: Patient back for MD visit.  Very pleased with her flexion and ordering a JAS brace.  She did great with therex today performing with excellent technique and no complaints.       OBJECTIVE IMPAIRMENTS: Abnormal gait, decreased activity tolerance, difficulty walking, decreased ROM, decreased strength, increased edema, and pain.   ACTIVITY LIMITATIONS: carrying, lifting, bending, standing, sleeping, stairs, transfers, and locomotion level  PARTICIPATION LIMITATIONS: meal prep, cleaning, laundry, driving, shopping, community activity, and yard work  PERSONAL FACTORS: Time since onset of injury/illness/exacerbation are also affecting patient's functional outcome.   REHAB POTENTIAL: Good  CLINICAL DECISION MAKING: Stable/uncomplicated  EVALUATION COMPLEXITY: Low   GOALS:  SHORT TERM GOALS: Target date: 01/29/24:  Ind with an initial HEP. Goal status: MET  2.  Full active left knee extension.  Goal status: On going   -5  LONG TERM  GOALS: Target date: 02/26/24.  Ind with an advanced HEP.  Goal status: On going  2.  Active left knee flexion to 115 degrees+ so the patient can perform functional tasks and do so with pain not > 2-3/10.  Goal status: MET  3.  Increase left hip and knee strength to a solid 4+/5 to provide good stability for accomplishment of functional activities.  Goal status: On going   4/5  4.  Decrease edema to within 3 cms of non-affected side to assist with pain reduction and range of motion gains.  Goal status: On going  5.  Perform a reciprocating stair gait with one railing with pain not > 2-3/10.  Goal status: MET     6.  Improve LEFS score to 49-50 points. Baseline:  Goal status: On going   PLAN:  PT FREQUENCY: 2x/week  PT DURATION: 6 weeks  PLANNED INTERVENTIONS: 97110-Therapeutic exercises, 97530- Therapeutic activity, [id number]- Neuromuscular re-education, 97535- Self Care, 02859- Manual  therapy, G0283- Electrical stimulation (unattended), 97016- Vasopneumatic device, Patient/Family education, Stair training, and Cryotherapy  PLAN FOR NEXT SESSION: Nustep, PROM.  Progress per TKA protocol.  LE elevation and vasopneumatic.     Romeka Scifres, [place], PT 02/29/2024, 5:34 PM

## 2024-03-01 DIAGNOSIS — J301 Allergic rhinitis due to pollen: Secondary | ICD-10-CM | POA: Diagnosis not present

## 2024-03-01 DIAGNOSIS — J3081 Allergic rhinitis due to animal (cat) (dog) hair and dander: Secondary | ICD-10-CM | POA: Diagnosis not present

## 2024-03-01 DIAGNOSIS — J3089 Other allergic rhinitis: Secondary | ICD-10-CM | POA: Diagnosis not present

## 2024-03-04 ENCOUNTER — Ambulatory Visit: Attending: Orthopedic Surgery | Admitting: Physical Therapy

## 2024-03-04 DIAGNOSIS — M6281 Muscle weakness (generalized): Secondary | ICD-10-CM | POA: Insufficient documentation

## 2024-03-04 DIAGNOSIS — R600 Localized edema: Secondary | ICD-10-CM | POA: Diagnosis not present

## 2024-03-04 DIAGNOSIS — M25662 Stiffness of left knee, not elsewhere classified: Secondary | ICD-10-CM | POA: Insufficient documentation

## 2024-03-04 DIAGNOSIS — G8929 Other chronic pain: Secondary | ICD-10-CM | POA: Diagnosis not present

## 2024-03-04 DIAGNOSIS — M25562 Pain in left knee: Secondary | ICD-10-CM | POA: Insufficient documentation

## 2024-03-04 NOTE — Therapy (Signed)
 OUTPATIENT PHYSICAL THERAPY LOWER EXTREMITY TREATMENT   Patient Name: Kristina Mclaughlin MRN: [id number] DOB:[birth year], 73 y.o., female Today's Date: 03/04/2024  END OF SESSION:  PT End of Session - 03/04/24 1106     Visit Number 15    Number of Visits 18    Date for PT Re-Evaluation 03/18/24    PT Start Time 1050    PT Stop Time 1149    PT Time Calculation (min) 59 min    Activity Tolerance Patient tolerated treatment well    Behavior During Therapy [facility] for tasks assessed/performed             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, lateral back   Depression    Essential hypertension 10/29/2015   GERD (gastroesophageal reflux disease)    Hypertension    Insomnia    Migraine headache    Tear of medial meniscus of knee 09/06/2018   Past Surgical History:  Procedure Laterality Date   ABDOMINAL HYSTERECTOMY     BACK SURGERY     BREAST SURGERY     biopsy x2; reduction   EXAMINATION UNDER ANESTHESIA  08/27/2012   LIPOMA EXCISION Right 07/03/2021   Procedure: EXCISION LIPOMA; ANKLE;  Surgeon: Kallie Manuelita BROCKS, MD;  Location: [facility];  Service: General;  Laterality: Right;   LIPOMA EXCISION N/A 07/03/2021   Procedure: EXCISION LIPOMA; SUPRAPUBIC;  Surgeon: Kallie Manuelita BROCKS, MD;  Location: [facility];  Service: General;  Laterality: N/A;   MASS EXCISION Left 07/03/2021   Procedure: EXCISION LIPOMA; ANKLE;  Surgeon: Kallie Manuelita BROCKS, MD;  Location: [facility];  Service: General;  Laterality: Left;   MASS EXCISION Left 07/03/2021   Procedure: EXCISION OF LIPOMA; ARM;  Surgeon: Kallie Manuelita BROCKS, MD;  Location: [facility];  Service: General;  Laterality: Left;   NECK SURGERY     Mass removed   REDUCTION MAMMAPLASTY Bilateral 2002   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   XI ROBOTIC ASSISTED HIATAL HERNIA REPAIR N/A 11/18/2022   Procedure: ROBOTIC HIATAL HERNIA REPAIR;  Surgeon:  Lyndel Deward PARAS, MD;  Location: [facility];  Service: General;  Laterality: N/A;   Patient Active Problem List   Diagnosis Date Noted   Non-recurrent acute serous otitis media of left ear 05/11/2023   Hiatal hernia 11/18/2022   Gastroesophageal reflux disease 02/10/2022   Chronic superficial gastritis without bleeding 02/10/2022   Fatty liver 02/10/2022   Allergic rhinitis due to animal (cat) (dog) hair and dander 01/20/2022   Cough variant asthma 01/20/2022   Chronic constipation 09/18/2021   Neuropathic pain 08/22/2021   Lipoma of foot 06/11/2021   Lipoma of upper arm 06/11/2021   Sensorineural hearing loss (SNHL) of left ear with unrestricted hearing of right ear 12/11/2020   Lipoma of abdominal wall 12/15/2019   Breast lipoma 12/15/2019   Lipoma of both lower extremities 12/15/2019   Primary insomnia 05/11/2018   Unilateral primary osteoarthritis, left knee 05/11/2018   Hemorrhoids 01/22/2018   Essential hypertension 10/29/2015   Upper airway cough syndrome 10/20/2014    REFERRING PROVIDER: Dempsey Moan MD  REFERRING DIAG: Left total knee replacement  THERAPY DIAG:  Chronic pain of left knee  Localized edema  Stiffness of left knee, not elsewhere classified  Muscle weakness (generalized)  Rationale for Evaluation and Treatment: Rehabilitation  ONSET DATE: Surgery date (01/12/24).  SUBJECTIVE:   SUBJECTIVE STATEMENT: Stiff.  In kitchen  cooking for about 2 1/2 hours.   PERTINENT HISTORY: See above.   PAIN:  Are you having pain? Yes: NPRS scale: 3-4/10 Pain location: Left knee. Pain description: Sharp. Aggravating factors: Movement. Relieving factors: Not much.  PRECAUTIONS: Other: No ultrasound.    WEIGHT BEARING RESTRICTIONS: No  FALLS:  Has patient fallen in last 6 months? No  LIVING ENVIRONMENT: Lives with: lives with their spouse Lives in: House/apartment Stairs: 4-6.  Non-reciprocating.   Has following equipment at home: Vannie - 2  wheeled  PLOF: Independent  PATIENT GOALS: Perform ADL's without knee pain.    OBJECTIVE:   PATIENT SURVEYS:  LEFS: 13/50.   EDEMA:  Circumferential: 9 cms > on left than right.    PALPATION: C/o diffuse left knee pain.    LOWER EXTREMITY ROM:  In supine:  Left knee extension is -20 degrees and flexion assessed in the seated position to 85 degrees.  02/22/24:  Active left knee flexion to 115 degrees.  LOWER EXTREMITY MMT:  Patient currently unable to to perform a left antigravity SAQ and SLR.  GAIT: Step-to gait pattern with a FWW.                                                                                                                              TREATMENT DATE:      03/04/24:                                     EXERCISE LOG  Exercise Repetitions and Resistance Comments  Recumbent bike 16 minutes   Knee ext 10# x 3 minutes   Ham curls 30# x 3 minutes   Leg Press 2 1/2 plates x 3 minutes       STW/M x 10 minutes f/b LE elevation and vasopneumatic on low x 15 minutes.    02/29/24:                                     EXERCISE LOG  Exercise Repetitions and Resistance Comments  Recumbent bike  Seat 6 x 15 minutes   Knee ext 20# x 3 minutes   Ham curls 40# x 3 minutes   Leg Press  2 plates x 3 minutes       Sustained 4 minutes left knee extension stretch.  LE elevation and vasopneumatic on low x 10 minutes.   02/22/24:                                     EXERCISE LOG    LT knee  Exercise Repetitions and Resistance Comments  Recumbent bike Progressing to seat 7,6    x  15 minutes.   Knee ext 10# x 3  minutes    pause at top   Ham curls 40# x 4 minutes     Leg Press  2 plates x 4 minutes    XTS blue Reversed TKE for ext stretch  x 10 quad sets with holds   LLLDS x 5 minutes for left knee extension  LE elevation and vasopneumatic on medium x .    02/19/24:                                     EXERCISE LOG  Exercise Repetitions and  Resistance Comments  Recumbent bike Progressing to seat 8 and level 5 over 15 minutes   Knee ext 10# x 3 minutes   Ham curls 30# x 3 minutes            In supine:  LLLDS left knee extension stretch/ham stretch x 6 minutes f/b LE elevation and vasopneumatic on medium x 20 minutes.      LT knee                                   02/16/24 EXERCISE LOG   LT knee  Exercise Repetitions and Resistance Comments  Nustep  L3 x 15 minutes @ seat 10-9,8   Bike X 10 mins L1-4  seat 9 Full revolutions  Rocker board  5 minutes    Wedge X 5 mins with quad sets for extension ROM   Standing HS stretch     6in step up    Squatting   BUE support from parallel bars  LAQ    LLE only   14in box lunge     Blank cell = exercise not performed today  Modalities: no redness or adverse reaction to today's modalities Manual PROM/STW   to LT knee for extension with long end-range holds  x 5 mins Date:  Vaso: Knee, 34 degrees; low pressure, 15 mins, Pain and Edema    PATIENT EDUCATION:  Education details: healing and expectations for soreness Person educated: Patient Education method: Explanation Education comprehension: verbalized understanding  HOME EXERCISE PROGRAM:   ASSESSMENT:  CLINICAL IMPRESSION: Going in for JAS fitting today.  Left knee felt stiff upon presentation to the clinic today.  She found soft tissue work very effective and her knee felt better after treatment.      OBJECTIVE IMPAIRMENTS: Abnormal gait, decreased activity tolerance, difficulty walking, decreased ROM, decreased strength, increased edema, and pain.   ACTIVITY LIMITATIONS: carrying, lifting, bending, standing, sleeping, stairs, transfers, and locomotion level  PARTICIPATION LIMITATIONS: meal prep, cleaning, laundry, driving, shopping, community activity, and yard work  PERSONAL FACTORS: Time since onset of injury/illness/exacerbation are also affecting patient's functional outcome.   REHAB POTENTIAL:  Good  CLINICAL DECISION MAKING: Stable/uncomplicated  EVALUATION COMPLEXITY: Low   GOALS:  SHORT TERM GOALS: Target date: 01/29/24:  Ind with an initial HEP. Goal status: MET  2.  Full active left knee extension.  Goal status: On going   -5  LONG TERM GOALS: Target date: 02/26/24.  Ind with an advanced HEP.  Goal status: On going  2.  Active left knee flexion to 115 degrees+ so the patient can perform functional tasks and do so with pain not > 2-3/10.  Goal status: MET  3.  Increase left hip and knee strength to a solid 4+/5 to provide good stability for accomplishment  of functional activities.  Goal status: On going   4/5  4.  Decrease edema to within 3 cms of non-affected side to assist with pain reduction and range of motion gains.  Goal status: On going  5.  Perform a reciprocating stair gait with one railing with pain not > 2-3/10.  Goal status: MET     6.  Improve LEFS score to 49-50 points. Baseline:  Goal status: On going   PLAN:  PT FREQUENCY: 2x/week  PT DURATION: 6 weeks  PLANNED INTERVENTIONS: 97110-Therapeutic exercises, 97530- Therapeutic activity, [id number]- Neuromuscular re-education, 97535- Self Care, 02859- Manual therapy, G0283- Electrical stimulation (unattended), 97016- Vasopneumatic device, Patient/Family education, Stair training, and Cryotherapy  PLAN FOR NEXT SESSION: Nustep, PROM.  Progress per TKA protocol.  LE elevation and vasopneumatic.     Thayden Lemire, [place], PT 03/04/2024, 12:32 PM

## 2024-03-05 ENCOUNTER — Other Ambulatory Visit: Payer: Self-pay | Admitting: Family Medicine

## 2024-03-05 DIAGNOSIS — N3281 Overactive bladder: Secondary | ICD-10-CM

## 2024-03-05 DIAGNOSIS — F5101 Primary insomnia: Secondary | ICD-10-CM

## 2024-03-08 ENCOUNTER — Ambulatory Visit: Admitting: Physical Therapy

## 2024-03-08 DIAGNOSIS — R600 Localized edema: Secondary | ICD-10-CM

## 2024-03-08 DIAGNOSIS — M6281 Muscle weakness (generalized): Secondary | ICD-10-CM

## 2024-03-08 DIAGNOSIS — G8929 Other chronic pain: Secondary | ICD-10-CM | POA: Diagnosis not present

## 2024-03-08 DIAGNOSIS — M25662 Stiffness of left knee, not elsewhere classified: Secondary | ICD-10-CM | POA: Diagnosis not present

## 2024-03-08 DIAGNOSIS — M25562 Pain in left knee: Secondary | ICD-10-CM | POA: Diagnosis not present

## 2024-03-08 NOTE — Therapy (Signed)
 OUTPATIENT PHYSICAL THERAPY LOWER EXTREMITY TREATMENT   Patient Name: Kristina Mclaughlin MRN: [id number] DOB:03/04/1951, 73 y.o., female Today's Date: 03/08/2024  END OF SESSION:  PT End of Session - 03/08/24 1122     Visit Number 16    Number of Visits 18    Date for PT Re-Evaluation 03/18/24    PT Start Time 1110    PT Stop Time 1201    PT Time Calculation (min) 51 min    Activity Tolerance Patient tolerated treatment well    Behavior During Therapy [facility] for tasks assessed/performed             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, lateral back   Depression    Essential hypertension 10/29/2015   GERD (gastroesophageal reflux disease)    Hypertension    Insomnia    Migraine headache    Tear of medial meniscus of knee 09/06/2018   Past Surgical History:  Procedure Laterality Date   ABDOMINAL HYSTERECTOMY     BACK SURGERY     BREAST SURGERY     biopsy x2; reduction   EXAMINATION UNDER ANESTHESIA  08/27/2012   LIPOMA EXCISION Right 07/03/2021   Procedure: EXCISION LIPOMA; ANKLE;  Surgeon: Kallie Manuelita BROCKS, MD;  Location: [facility];  Service: General;  Laterality: Right;   LIPOMA EXCISION N/A 07/03/2021   Procedure: EXCISION LIPOMA; SUPRAPUBIC;  Surgeon: Kallie Manuelita BROCKS, MD;  Location: [facility];  Service: General;  Laterality: N/A;   MASS EXCISION Left 07/03/2021   Procedure: EXCISION LIPOMA; ANKLE;  Surgeon: Kallie Manuelita BROCKS, MD;  Location: [facility];  Service: General;  Laterality: Left;   MASS EXCISION Left 07/03/2021   Procedure: EXCISION OF LIPOMA; ARM;  Surgeon: Kallie Manuelita BROCKS, MD;  Location: [facility];  Service: General;  Laterality: Left;   NECK SURGERY     Mass removed   REDUCTION MAMMAPLASTY Bilateral 2002   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   XI ROBOTIC ASSISTED HIATAL HERNIA REPAIR N/A 11/18/2022   Procedure: ROBOTIC HIATAL HERNIA REPAIR;  Surgeon:  Lyndel Deward PARAS, MD;  Location: [facility];  Service: General;  Laterality: N/A;   Patient Active Problem List   Diagnosis Date Noted   Non-recurrent acute serous otitis media of left ear 05/11/2023   Hiatal hernia 11/18/2022   Gastroesophageal reflux disease 02/10/2022   Chronic superficial gastritis without bleeding 02/10/2022   Fatty liver 02/10/2022   Allergic rhinitis due to animal (cat) (dog) hair and dander 01/20/2022   Cough variant asthma 01/20/2022   Chronic constipation 09/18/2021   Neuropathic pain 08/22/2021   Lipoma of foot 06/11/2021   Lipoma of upper arm 06/11/2021   Sensorineural hearing loss (SNHL) of left ear with unrestricted hearing of right ear 12/11/2020   Lipoma of abdominal wall 12/15/2019   Breast lipoma 12/15/2019   Lipoma of both lower extremities 12/15/2019   Primary insomnia 05/11/2018   Unilateral primary osteoarthritis, left knee 05/11/2018   Hemorrhoids 01/22/2018   Essential hypertension 10/29/2015   Upper airway cough syndrome 10/20/2014    REFERRING PROVIDER: Dempsey Moan MD  REFERRING DIAG: Left total knee replacement  THERAPY DIAG:  Chronic pain of left knee  Localized edema  Stiffness of left knee, not elsewhere classified  Muscle weakness (generalized)  Rationale for Evaluation and Treatment: Rehabilitation  ONSET DATE: Surgery date (01/12/24).  SUBJECTIVE:   SUBJECTIVE STATEMENT: Doing better since last  session.    PERTINENT HISTORY: See above.   PAIN:  Are you having pain? Yes: NPRS scale: 3-4/10 Pain location: Left knee. Pain description: Sharp. Aggravating factors: Movement. Relieving factors: Not much.  PRECAUTIONS: Other: No ultrasound.    WEIGHT BEARING RESTRICTIONS: No  FALLS:  Has patient fallen in last 6 months? No  LIVING ENVIRONMENT: Lives with: lives with their spouse Lives in: House/apartment Stairs: 4-6.  Non-reciprocating.   Has following equipment at home: Vannie - 2 wheeled  PLOF:  Independent  PATIENT GOALS: Perform ADL's without knee pain.    OBJECTIVE:   PATIENT SURVEYS:  LEFS: 13/50.   EDEMA:  Circumferential: 9 cms > on left than right.    PALPATION: C/o diffuse left knee pain.    LOWER EXTREMITY ROM:  In supine:  Left knee extension is -20 degrees and flexion assessed in the seated position to 85 degrees.  02/22/24:  Active left knee flexion to 115 degrees.  LOWER EXTREMITY MMT:  Patient currently unable to to perform a left antigravity SAQ and SLR.  GAIT: Step-to gait pattern with a FWW.                                                                                                                              TREATMENT DATE:     03/08/24:                                     EXERCISE LOG  Exercise Repetitions and Resistance Comments  Recumbent bike Progressing to seat 5 over 15 minutes.   Knee ext 20# x 3 minutes   Ham curls 40# x 3 minutes   Leg Press 3 plates x 3 minutes        STW/M x 7 minutes f/b LE elevation and vasopneumatic on low x 15 minutes.     03/04/24:                                     EXERCISE LOG  Exercise Repetitions and Resistance Comments  Recumbent bike 16 minutes   Knee ext 10# x 3 minutes   Ham curls 30# x 3 minutes   Leg Press 2 1/2 plates x 3 minutes       STW/M x 10 minutes f/b LE elevation and vasopneumatic on low x 15 minutes.    02/29/24:                                     EXERCISE LOG  Exercise Repetitions and Resistance Comments  Recumbent bike  Seat 6 x 15 minutes   Knee ext 20# x 3 minutes   Ham curls 40# x 3 minutes   Leg Press  2  plates x 3 minutes       Sustained 4 minutes left knee extension stretch.  LE elevation and vasopneumatic on low x 10 minutes.   02/22/24:                                     EXERCISE LOG    LT knee  Exercise Repetitions and Resistance Comments  Recumbent bike Progressing to seat 7,6    x  15 minutes.   Knee ext 10# x 3 minutes    pause at top   Ham  curls 40# x 4 minutes     Leg Press  2 plates x 4 minutes    XTS blue Reversed TKE for ext stretch  x 10 quad sets with holds   LLLDS x 5 minutes for left knee extension  LE elevation and vasopneumatic on medium x .    02/19/24:                                     EXERCISE LOG  Exercise Repetitions and Resistance Comments  Recumbent bike Progressing to seat 8 and level 5 over 15 minutes   Knee ext 10# x 3 minutes   Ham curls 30# x 3 minutes            In supine:  LLLDS left knee extension stretch/ham stretch x 6 minutes f/b LE elevation and vasopneumatic on medium x 20 minutes.      LT knee                                   02/16/24 EXERCISE LOG   LT knee  Exercise Repetitions and Resistance Comments  Nustep  L3 x 15 minutes @ seat 10-9,8   Bike X 10 mins L1-4  seat 9 Full revolutions  Rocker board  5 minutes    Wedge X 5 mins with quad sets for extension ROM   Standing HS stretch     6in step up    Squatting   BUE support from parallel bars  LAQ    LLE only   14in box lunge     Blank cell = exercise not performed today  Modalities: no redness or adverse reaction to today's modalities Manual PROM/STW   to LT knee for extension with long end-range holds  x 5 mins Date:  Vaso: Knee, 34 degrees; low pressure, 15 mins, Pain and Edema    PATIENT EDUCATION:  Education details: healing and expectations for soreness Person educated: Patient Education method: Explanation Education comprehension: verbalized understanding  HOME EXERCISE PROGRAM:   ASSESSMENT:  CLINICAL IMPRESSION: Excellent job today progressing to seat 5 on the recumbent bike and increasing leg press exercise to 3 plates.    OBJECTIVE IMPAIRMENTS: Abnormal gait, decreased activity tolerance, difficulty walking, decreased ROM, decreased strength, increased edema, and pain.   ACTIVITY LIMITATIONS: carrying, lifting, bending, standing, sleeping, stairs, transfers, and locomotion  level  PARTICIPATION LIMITATIONS: meal prep, cleaning, laundry, driving, shopping, community activity, and yard work  PERSONAL FACTORS: Time since onset of injury/illness/exacerbation are also affecting patient's functional outcome.   REHAB POTENTIAL: Good  CLINICAL DECISION MAKING: Stable/uncomplicated  EVALUATION COMPLEXITY: Low   GOALS:  SHORT TERM GOALS: Target date: 01/29/24:  Ind with an initial HEP.  Goal status: MET  2.  Full active left knee extension.  Goal status: On going   -5  LONG TERM GOALS: Target date: 02/26/24.  Ind with an advanced HEP.  Goal status: On going  2.  Active left knee flexion to 115 degrees+ so the patient can perform functional tasks and do so with pain not > 2-3/10.  Goal status: MET  3.  Increase left hip and knee strength to a solid 4+/5 to provide good stability for accomplishment of functional activities.  Goal status: On going   4/5  4.  Decrease edema to within 3 cms of non-affected side to assist with pain reduction and range of motion gains.  Goal status: On going  5.  Perform a reciprocating stair gait with one railing with pain not > 2-3/10.  Goal status: MET     6.  Improve LEFS score to 49-50 points. Baseline:  Goal status: On going   PLAN:  PT FREQUENCY: 2x/week  PT DURATION: 6 weeks  PLANNED INTERVENTIONS: 97110-Therapeutic exercises, 97530- Therapeutic activity, [id number]- Neuromuscular re-education, 97535- Self Care, [id number]- Manual therapy, G0283- Electrical stimulation (unattended), 97016- Vasopneumatic device, Patient/Family education, Stair training, and Cryotherapy  PLAN FOR NEXT SESSION: Nustep, PROM.  Progress per TKA protocol.  LE elevation and vasopneumatic.     Kiowa Peifer, [place], PT 03/08/2024, 12:04 PM

## 2024-03-10 ENCOUNTER — Ambulatory Visit: Admitting: *Deleted

## 2024-03-10 ENCOUNTER — Encounter: Payer: Self-pay | Admitting: *Deleted

## 2024-03-10 DIAGNOSIS — J301 Allergic rhinitis due to pollen: Secondary | ICD-10-CM | POA: Diagnosis not present

## 2024-03-10 DIAGNOSIS — G8929 Other chronic pain: Secondary | ICD-10-CM

## 2024-03-10 DIAGNOSIS — R600 Localized edema: Secondary | ICD-10-CM

## 2024-03-10 DIAGNOSIS — M25662 Stiffness of left knee, not elsewhere classified: Secondary | ICD-10-CM | POA: Diagnosis not present

## 2024-03-10 DIAGNOSIS — M6281 Muscle weakness (generalized): Secondary | ICD-10-CM

## 2024-03-10 DIAGNOSIS — J3081 Allergic rhinitis due to animal (cat) (dog) hair and dander: Secondary | ICD-10-CM | POA: Diagnosis not present

## 2024-03-10 DIAGNOSIS — J3089 Other allergic rhinitis: Secondary | ICD-10-CM | POA: Diagnosis not present

## 2024-03-10 DIAGNOSIS — M25562 Pain in left knee: Secondary | ICD-10-CM | POA: Diagnosis not present

## 2024-03-10 NOTE — Therapy (Signed)
 OUTPATIENT PHYSICAL THERAPY LOWER EXTREMITY TREATMENT   Patient Name: Kristina Mclaughlin MRN: [id number] DOB:[birth year], 73 y.o., female Today's Date: 03/10/2024  END OF SESSION:  PT End of Session - 03/10/24 1438     Visit Number 17    Number of Visits 18    Date for PT Re-Evaluation 03/18/24    PT Start Time 1430    PT Stop Time 1530    PT Time Calculation (min) 60 min             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, lateral back   Depression    Essential hypertension 10/29/2015   GERD (gastroesophageal reflux disease)    Hypertension    Insomnia    Migraine headache    Tear of medial meniscus of knee 09/06/2018   Past Surgical History:  Procedure Laterality Date   ABDOMINAL HYSTERECTOMY     BACK SURGERY     BREAST SURGERY     biopsy x2; reduction   EXAMINATION UNDER ANESTHESIA  08/27/2012   LIPOMA EXCISION Right 07/03/2021   Procedure: EXCISION LIPOMA; ANKLE;  Surgeon: Kallie Manuelita BROCKS, MD;  Location: [facility];  Service: General;  Laterality: Right;   LIPOMA EXCISION N/A 07/03/2021   Procedure: EXCISION LIPOMA; SUPRAPUBIC;  Surgeon: Kallie Manuelita BROCKS, MD;  Location: [facility];  Service: General;  Laterality: N/A;   MASS EXCISION Left 07/03/2021   Procedure: EXCISION LIPOMA; ANKLE;  Surgeon: Kallie Manuelita BROCKS, MD;  Location: [facility];  Service: General;  Laterality: Left;   MASS EXCISION Left 07/03/2021   Procedure: EXCISION OF LIPOMA; ARM;  Surgeon: Kallie Manuelita BROCKS, MD;  Location: [facility];  Service: General;  Laterality: Left;   NECK SURGERY     Mass removed   REDUCTION MAMMAPLASTY Bilateral 2002   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   XI ROBOTIC ASSISTED HIATAL HERNIA REPAIR N/A 11/18/2022   Procedure: ROBOTIC HIATAL HERNIA REPAIR;  Surgeon: Lyndel Deward PARAS, MD;  Location: [facility];  Service: General;  Laterality: N/A;   Patient Active Problem List   Diagnosis  Date Noted   Non-recurrent acute serous otitis media of left ear 05/11/2023   Hiatal hernia 11/18/2022   Gastroesophageal reflux disease 02/10/2022   Chronic superficial gastritis without bleeding 02/10/2022   Fatty liver 02/10/2022   Allergic rhinitis due to animal (cat) (dog) hair and dander 01/20/2022   Cough variant asthma 01/20/2022   Chronic constipation 09/18/2021   Neuropathic pain 08/22/2021   Lipoma of foot 06/11/2021   Lipoma of upper arm 06/11/2021   Sensorineural hearing loss (SNHL) of left ear with unrestricted hearing of right ear 12/11/2020   Lipoma of abdominal wall 12/15/2019   Breast lipoma 12/15/2019   Lipoma of both lower extremities 12/15/2019   Primary insomnia 05/11/2018   Unilateral primary osteoarthritis, left knee 05/11/2018   Hemorrhoids 01/22/2018   Essential hypertension 10/29/2015   Upper airway cough syndrome 10/20/2014    REFERRING PROVIDER: Dempsey Moan MD  REFERRING DIAG: Left total knee replacement  THERAPY DIAG:  Chronic pain of left knee  Localized edema  Stiffness of left knee, not elsewhere classified  Muscle weakness (generalized)  Rationale for Evaluation and Treatment: Rehabilitation  ONSET DATE: Surgery date (01/12/24).  SUBJECTIVE:   SUBJECTIVE STATEMENT: Doing better with LT knee and able to go up stairs normally now. Will get JAS brace for extension soon.  PERTINENT HISTORY:  See above.   PAIN:  Are you having pain? Yes: NPRS scale: 3-4/10 Pain location: Left knee. Pain description: Sharp. Aggravating factors: Movement. Relieving factors: Not much.  PRECAUTIONS: Other: No ultrasound.    WEIGHT BEARING RESTRICTIONS: No  FALLS:  Has patient fallen in last 6 months? No  LIVING ENVIRONMENT: Lives with: lives with their spouse Lives in: House/apartment Stairs: 4-6.  Non-reciprocating.   Has following equipment at home: Vannie - 2 wheeled  PLOF: Independent  PATIENT GOALS: Perform ADL's without knee pain.     OBJECTIVE:   PATIENT SURVEYS:  LEFS: 13/50.   EDEMA:  Circumferential: 9 cms > on left than right.    PALPATION: C/o diffuse left knee pain.    LOWER EXTREMITY ROM:  In supine:  Left knee extension is -20 degrees and flexion assessed in the seated position to 85 degrees.  02/22/24:  Active left knee flexion to 115 degrees.  LOWER EXTREMITY MMT:  Patient currently unable to to perform a left antigravity SAQ and SLR.  GAIT: Step-to gait pattern with a FWW.                                                                                                                              TREATMENT DATE:     03/10/24:                                     EXERCISE LOG    LT knee  Exercise Repetitions and Resistance Comments  Recumbent bike Progressing to seat 5 over 15 minutes.  L2-3   Knee ext 20# x 4 minutes   Ham curls 40# x 4 minutes   Leg Press 2 plates x 3 minutes   seat 6,5        STW/ and PROM for extension ROM progression f/b LE elevation and vasopneumatic on low x 15 minutes.     03/04/24:                                     EXERCISE LOG  Exercise Repetitions and Resistance Comments  Recumbent bike 16 minutes   Knee ext 10# x 3 minutes   Ham curls 30# x 3 minutes   Leg Press 2 1/2 plates x 3 minutes       STW/M x 10 minutes f/b LE elevation and vasopneumatic on low x 15 minutes.    02/29/24:                                     EXERCISE LOG  Exercise Repetitions and Resistance Comments  Recumbent bike  Seat 6 x 15 minutes   Knee ext 20# x 3 minutes   Ham curls 40# x  3 minutes   Leg Press  2 plates x 3 minutes       Sustained 4 minutes left knee extension stretch.  LE elevation and vasopneumatic on low x 10 minutes.   02/22/24:                                     EXERCISE LOG    LT knee  Exercise Repetitions and Resistance Comments  Recumbent bike Progressing to seat 7,6    x  15 minutes.   Knee ext 10# x 3 minutes    pause at top   Ham curls 40# x 4  minutes     Leg Press  2 plates x 4 minutes    XTS blue Reversed TKE for ext stretch  x 10 quad sets with holds   LLLDS x 5 minutes for left knee extension  LE elevation and vasopneumatic on medium x .    02/19/24:                                     EXERCISE LOG  Exercise Repetitions and Resistance Comments  Recumbent bike Progressing to seat 8 and level 5 over 15 minutes   Knee ext 10# x 3 minutes   Ham curls 30# x 3 minutes            In supine:  LLLDS left knee extension stretch/ham stretch x 6 minutes f/b LE elevation and vasopneumatic on medium x 20 minutes.      LT knee                                   02/16/24 EXERCISE LOG   LT knee  Exercise Repetitions and Resistance Comments  Nustep  L3 x 15 minutes @ seat 10-9,8   Bike X 10 mins L1-4  seat 9 Full revolutions  Rocker board  5 minutes    Wedge X 5 mins with quad sets for extension ROM   Standing HS stretch     6in step up    Squatting   BUE support from parallel bars  LAQ    LLE only   14in box lunge     Blank cell = exercise not performed today  Modalities: no redness or adverse reaction to today's modalities Manual PROM/STW   to LT knee for extension with long end-range holds  x 5 mins Date:  Vaso: Knee, 34 degrees; low pressure, 15 mins, Pain and Edema    PATIENT EDUCATION:  Education details: healing and expectations for soreness Person educated: Patient Education method: Explanation Education comprehension: verbalized understanding  HOME EXERCISE PROGRAM:   ASSESSMENT:  CLINICAL IMPRESSION: Pt arrived today reporting some tightness /pain LT knee lateral aspect, but doing good. She was able to continue with LE strengthening as well as knee extension PROM stretching. She reports that she will receive a brace next week hopefully.    OBJECTIVE IMPAIRMENTS: Abnormal gait, decreased activity tolerance, difficulty walking, decreased ROM, decreased strength, increased edema, and pain.    ACTIVITY LIMITATIONS: carrying, lifting, bending, standing, sleeping, stairs, transfers, and locomotion level  PARTICIPATION LIMITATIONS: meal prep, cleaning, laundry, driving, shopping, community activity, and yard work  PERSONAL FACTORS: Time since onset of injury/illness/exacerbation are also affecting patient's functional outcome.  REHAB POTENTIAL: Good  CLINICAL DECISION MAKING: Stable/uncomplicated  EVALUATION COMPLEXITY: Low   GOALS:  SHORT TERM GOALS: Target date: 01/29/24:  Ind with an initial HEP. Goal status: MET  2.  Full active left knee extension.  Goal status: On going   -5  LONG TERM GOALS: Target date: 02/26/24.  Ind with an advanced HEP.  Goal status: On going  2.  Active left knee flexion to 115 degrees+ so the patient can perform functional tasks and do so with pain not > 2-3/10.  Goal status: MET  3.  Increase left hip and knee strength to a solid 4+/5 to provide good stability for accomplishment of functional activities.  Goal status: On going   4/5  4.  Decrease edema to within 3 cms of non-affected side to assist with pain reduction and range of motion gains.  Goal status: On going  5.  Perform a reciprocating stair gait with one railing with pain not > 2-3/10.  Goal status: MET     6.  Improve LEFS score to 49-50 points. Baseline:  Goal status: On going   PLAN:  PT FREQUENCY: 2x/week  PT DURATION: 6 weeks  PLANNED INTERVENTIONS: 97110-Therapeutic exercises, 97530- Therapeutic activity, [id number]- Neuromuscular re-education, 97535- Self Care, 02859- Manual therapy, G0283- Electrical stimulation (unattended), 97016- Vasopneumatic device, Patient/Family education, Stair training, and Cryotherapy  PLAN FOR NEXT SESSION: Nustep, PROM.  Progress per TKA protocol.  LE elevation and vasopneumatic.     Kasee Hantz,[place], PTA 03/10/2024, 5:36 PM

## 2024-03-11 ENCOUNTER — Ambulatory Visit (HOSPITAL_COMMUNITY): Attending: Family Medicine | Admitting: Physical Therapy

## 2024-03-11 ENCOUNTER — Other Ambulatory Visit: Payer: Self-pay

## 2024-03-11 DIAGNOSIS — R600 Localized edema: Secondary | ICD-10-CM | POA: Diagnosis present

## 2024-03-11 DIAGNOSIS — G8929 Other chronic pain: Secondary | ICD-10-CM | POA: Insufficient documentation

## 2024-03-11 DIAGNOSIS — M25562 Pain in left knee: Secondary | ICD-10-CM | POA: Insufficient documentation

## 2024-03-11 DIAGNOSIS — M7989 Other specified soft tissue disorders: Secondary | ICD-10-CM | POA: Diagnosis not present

## 2024-03-11 NOTE — Therapy (Signed)
 OUTPATIENT PHYSICAL THERAPY LYMPHEDEMA EVALUATION  Patient Name: Kristina Mclaughlin MRN: [id number] DOB:[birth year], 73 y.o., female Today's Date: 03/11/2024  END OF SESSION:  PT End of Session - 03/11/24 1343     Visit Number 1    Number of Visits 8    Date for PT Re-Evaluation 04/10/24    Authorization Type [employer]    PT Start Time 1245    PT Stop Time 1330    PT Time Calculation (min) 45 min    Activity Tolerance Patient tolerated treatment well          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, lateral back   Depression    Essential hypertension 10/29/2015   GERD (gastroesophageal reflux disease)    Hypertension    Insomnia    Migraine headache    Tear of medial meniscus of knee 09/06/2018   Past Surgical History:  Procedure Laterality Date   ABDOMINAL HYSTERECTOMY     BACK SURGERY     BREAST SURGERY     biopsy x2; reduction   EXAMINATION UNDER ANESTHESIA  08/27/2012   LIPOMA EXCISION Right 07/03/2021   Procedure: EXCISION LIPOMA; ANKLE;  Surgeon: Kallie Manuelita BROCKS, MD;  Location: [facility];  Service: General;  Laterality: Right;   LIPOMA EXCISION N/A 07/03/2021   Procedure: EXCISION LIPOMA; SUPRAPUBIC;  Surgeon: Kallie Manuelita BROCKS, MD;  Location: [facility];  Service: General;  Laterality: N/A;   MASS EXCISION Left 07/03/2021   Procedure: EXCISION LIPOMA; ANKLE;  Surgeon: Kallie Manuelita BROCKS, MD;  Location: [facility];  Service: General;  Laterality: Left;   MASS EXCISION Left 07/03/2021   Procedure: EXCISION OF LIPOMA; ARM;  Surgeon: Kallie Manuelita BROCKS, MD;  Location: [facility];  Service: General;  Laterality: Left;   NECK SURGERY     Mass removed   REDUCTION MAMMAPLASTY Bilateral 2002   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   XI ROBOTIC ASSISTED HIATAL HERNIA REPAIR N/A 11/18/2022   Procedure: ROBOTIC HIATAL HERNIA REPAIR;  Surgeon: Lyndel Deward PARAS, MD;  Location: [facility];  Service:  General;  Laterality: N/A;   Patient Active Problem List   Diagnosis Date Noted   Non-recurrent acute serous otitis media of left ear 05/11/2023   Hiatal hernia 11/18/2022   Gastroesophageal reflux disease 02/10/2022   Chronic superficial gastritis without bleeding 02/10/2022   Fatty liver 02/10/2022   Allergic rhinitis due to animal (cat) (dog) hair and dander 01/20/2022   Cough variant asthma 01/20/2022   Chronic constipation 09/18/2021   Neuropathic pain 08/22/2021   Lipoma of foot 06/11/2021   Lipoma of upper arm 06/11/2021   Sensorineural hearing loss (SNHL) of left ear with unrestricted hearing of right ear 12/11/2020   Lipoma of abdominal wall 12/15/2019   Breast lipoma 12/15/2019   Lipoma of both lower extremities 12/15/2019   Primary insomnia 05/11/2018   Unilateral primary osteoarthritis, left knee 05/11/2018   Hemorrhoids 01/22/2018   Essential hypertension 10/29/2015   Upper airway cough syndrome 10/20/2014    PCP: Jolinda Norene HERO, DO  REFERRING PROVIDER: Jolinda Norene HERO, DO  REFERRING DIAG: [phone] (ICD-10-CM) - Left leg swelling  THERAPY DIAG:  M79.89 (ICD-10-CM) - Left leg swelling  Rationale for Evaluation and Treatment: Rehabilitation  ONSET DATE: chronic  SUBJECTIVE:  SUBJECTIVE STATEMENT: Kristina Mclaughlin states that she had arthroscopic surgery five years ago not long after this she had increased swelling.  She asked her current therapist who is now seeing her for her TKR if her Lt leg could have lymphedema.  She states it is difficult to have pants that fit.  The pt states that she has been elevating her legs, exercising since mid June without having any relief.  She has used compression garment without success.  She is getting a JAS brace to assist in straightening her  let.    PERTINENT HISTORY: Pt has hx of OA in Lt knee has had a past arthroscopic surgery followed by Lt TKR on 01/12/24.  Has hx of lipoma's and cervical surgeries.  PAIN:  Are you having pain? Yes NPRS scale: 3/10 Pain location: knee Pain orientation: Left  PAIN TYPE: throbbing and tight Pain description: intermittent  Aggravating factors: staying up to long  Relieving factors: elevate and ice  note TKR in June   PRECAUTIONS: None  RED FLAGS: None   WEIGHT BEARING RESTRICTIONS: No  FALLS:  Has patient fallen in last 6 months? 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  COGNITION: Overall cognitive status: Within functional limits for tasks assessed   PALPATION: No induration noted but positive edema   OBSERVATIONS / OTHER ASSESSMENTS: negative stemmer sign;   LYMPHEDEMA ASSESSMENTS:    LE LANDMARK RIGHT eval  At groin   30 cm proximal to suprapatella   20 cm proximal to suprapatella 64  10 cm proximal to suprapatella 57.5  At midpatella / popliteal crease 44.3  30 cm proximal to floor at lateral plantar foot 40.5  20 cm proximal to floor at lateral plantar foot 25.9  10 cm proximal to floor at lateral plantar foot 22.4  Circumference of ankle/heel 31.2  5 cm proximal to 1st MTP joint 21.6  Across MTP joint 22.3  Around proximal great toe 7.5  (Blank rows = not tested)  LE LANDMARK LEFT eval  At groin   30 cm proximal to suprapatella   20 cm proximal to suprapatella 65.2  10 cm proximal to suprapatella 61.8  At midpatella / popliteal crease 52.3  30 cm proximal to floor at lateral plantar foot 40.9  20 cm proximal to floor at lateral plantar foot 28.2  10 cm  proximal to floor at lateral plantar foot 23.9  Circumference of ankle/heel 31.3  5 cm proximal to 1st MTP joint 20.6  Across MTP joint 22.2  Around proximal great toe 7.7  (Blank rows = not tested)    TODAY'S TREATMENT:  DATE: 03/11/24 Evaluation Explanation that most likely pt does not have lymphedema, however, lymphedema techniques will be able to assist her in decreasing the edema in her Lt LE.      PATIENT EDUCATION:  Education details: HEP/ where to obtain capri compression,  next treatment we will begin manual lymphatic techniques.  Person educated: Patient Education method: Explanation, Demonstration, and Handouts Education comprehension: verbalized understanding and returned demonstration  HOME EXERCISE PROGRAM: Access Code: [email or web address] URL: [email or web address] Date: 03/11/2024 Prepared by: Montie Metro  Exercises - Seated Diaphragmatic Breathing  - 1 x daily - 7 x weekly - 10 reps - 5 hold - Seated Cervical Sidebending AROM  - 1 x daily - 7 x weekly - 1 sets - 10 reps - 2-3 hold - Seated Cervical Rotation AROM  - 1 x daily - 7 x weekly - 1 sets - 10 reps - 2-3 hold - Seated Cervical Extension AROM  - 1 x daily - 7 x weekly - 1 sets - 10 reps - 2-3 hold - Seated Cervical Retraction  - 1 x daily - 7 x weekly - 1 sets - 10 reps - 2-3 hold - Shoulder Rolls in Sitting  - 1 x daily - 7 x weekly - 1 sets - 10 reps - 2-3 hold - Seated Sidebending Arms Overhead  - 1 x daily - 7 x weekly - 1 sets - 10 reps - 2-3 hold - Seated Hip Abduction  - 1 x daily - 7 x weekly - 1 sets - 10 reps - 2-3 hold - Seated Long Arc Quad  - 1 x daily - 7 x weekly - 1 sets - 10 reps - 2-3 hold - Seated Heel Toe Raises  - 1 x daily - 7 x weekly - 1 sets - 10 reps - 2-3 hold - Seated Toe Curl  - 1 x daily - 7 x weekly - 1 sets - 10 reps - 2-3  hold  ASSESSMENT:  CLINICAL IMPRESSION: Patient is a 73 y.o. female who was seen today for physical therapy evaluation and treatment for edema in pt Lt LE.  Pt has had a recent Lt knee replacements but states she had edema prior to the TKR that she had never been able to get to go away.  She is receiving therapy for post TKR at another facility .  Evaluation demonstrates noted increased edema mainly proximal and at the knee jt with no significant edema distal to the knee jt indicative of chronic stubborn orthopedic edema rather than lymphedema.  The therapist explained that even though the pt does not have lymphedema a lymphedema therapist can do manual techniques and educate pt in exercise, compression and manual to assist her in decreasing her edema.  Kristina Mclaughlin will benefit in skilled PT for manual techniques to decrease her edema in order to allow pt to improve her knee flexion as well as to be able to wear pants again.   OBJECTIVE IMPAIRMENTS: Abnormal gait, decreased activity tolerance, decreased ROM, decreased strength, increased edema, and pain.   ACTIVITY LIMITATIONS: dressing, hygiene/grooming, and locomotion level  PARTICIPATION LIMITATIONS: shopping and community activity  PERSONAL FACTORS: Time since onset of injury/illness/exacerbation and 1-2 comorbidities: recent TKR are also affecting patient's functional outcome.   REHAB POTENTIAL: Good  CLINICAL DECISION MAKING: Stable/uncomplicated  EVALUATION COMPLEXITY: Moderate  GOALS: Goals reviewed with patient? Yes  SHORT TERM GOALS: Target date: 03/25/24  Pt to have lost 2 cm from Lt LE at knee and 10 cm proximal  to knee area to make wearing pants easier for pt and allow pt to bend her knee easier.  Baseline: Goal status: INITIAL  2.  Pt be completing HEP to increase lymphatic circulation to aid in decreasing edema in Lt knee Baseline:  Goal status: INITIAL  3.  Pt to be wearing compression capri in order to improve  lymphatic circulation to aid in decreasing edema in Lt knee  Baseline:  Goal status: INITIAL   LONG TERM GOALS: Target date: 04/11/23  Pt to have lost 3-4 cm from Lt LE at knee and 10 cm proximal to knee area to make wearing pants easier for pt and allow pt to bend her knee easier.  Baseline:  Goal status: INITIAL  2.  Pt to be I in self manual techniques to improve lymphatic circulation to decrease edema in Lt knee area.  Baseline:  Goal status: INITIAL  PLAN:  PT FREQUENCY: 2x/week  PT DURATION: 4 weeks  PLANNED INTERVENTIONS: 97110-Therapeutic exercises, 97535- Self Care, and 02859- Manual therapy  PLAN FOR NEXT SESSION: Begin manual techniques, don compression capri immediately after once obtained, answer any questions on exercises and teach self manual techniques.    Montie Metro, PT CLT [phone]  03/11/2024, 1:44 PM

## 2024-03-14 ENCOUNTER — Ambulatory Visit (HOSPITAL_COMMUNITY): Admitting: Physical Therapy

## 2024-03-14 DIAGNOSIS — R600 Localized edema: Secondary | ICD-10-CM | POA: Diagnosis not present

## 2024-03-14 NOTE — Therapy (Signed)
 OUTPATIENT PHYSICAL THERAPY LYMPHEDEMA TREATMENT  Patient Name: Kristina Mclaughlin MRN: [id number] DOB:18-Nov-1950, 73 y.o., female Today's Date: 03/14/2024  END OF SESSION:  PT End of Session - 03/14/24 1350     Visit Number 2    Number of Visits 8    Date for PT Re-Evaluation 04/10/24    Authorization Type [employer]    PT Start Time 1305    PT Stop Time 1348    PT Time Calculation (min) 43 min    Activity Tolerance Patient tolerated treatment well          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, lateral back   Depression    Essential hypertension 10/29/2015   GERD (gastroesophageal reflux disease)    Hypertension    Insomnia    Migraine headache    Tear of medial meniscus of knee 09/06/2018   Past Surgical History:  Procedure Laterality Date   ABDOMINAL HYSTERECTOMY     BACK SURGERY     BREAST SURGERY     biopsy x2; reduction   EXAMINATION UNDER ANESTHESIA  08/27/2012   LIPOMA EXCISION Right 07/03/2021   Procedure: EXCISION LIPOMA; ANKLE;  Surgeon: Kallie Manuelita BROCKS, MD;  Location: [facility];  Service: General;  Laterality: Right;   LIPOMA EXCISION N/A 07/03/2021   Procedure: EXCISION LIPOMA; SUPRAPUBIC;  Surgeon: Kallie Manuelita BROCKS, MD;  Location: [facility];  Service: General;  Laterality: N/A;   MASS EXCISION Left 07/03/2021   Procedure: EXCISION LIPOMA; ANKLE;  Surgeon: Kallie Manuelita BROCKS, MD;  Location: [facility];  Service: General;  Laterality: Left;   MASS EXCISION Left 07/03/2021   Procedure: EXCISION OF LIPOMA; ARM;  Surgeon: Kallie Manuelita BROCKS, MD;  Location: [facility];  Service: General;  Laterality: Left;   NECK SURGERY     Mass removed   REDUCTION MAMMAPLASTY Bilateral 2002   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   XI ROBOTIC ASSISTED HIATAL HERNIA REPAIR N/A 11/18/2022   Procedure: ROBOTIC HIATAL HERNIA REPAIR;  Surgeon: Lyndel Deward PARAS, MD;  Location: [facility];  Service:  General;  Laterality: N/A;   Patient Active Problem List   Diagnosis Date Noted   Non-recurrent acute serous otitis media of left ear 05/11/2023   Hiatal hernia 11/18/2022   Gastroesophageal reflux disease 02/10/2022   Chronic superficial gastritis without bleeding 02/10/2022   Fatty liver 02/10/2022   Allergic rhinitis due to animal (cat) (dog) hair and dander 01/20/2022   Cough variant asthma 01/20/2022   Chronic constipation 09/18/2021   Neuropathic pain 08/22/2021   Lipoma of foot 06/11/2021   Lipoma of upper arm 06/11/2021   Sensorineural hearing loss (SNHL) of left ear with unrestricted hearing of right ear 12/11/2020   Lipoma of abdominal wall 12/15/2019   Breast lipoma 12/15/2019   Lipoma of both lower extremities 12/15/2019   Primary insomnia 05/11/2018   Unilateral primary osteoarthritis, left knee 05/11/2018   Hemorrhoids 01/22/2018   Essential hypertension 10/29/2015   Upper airway cough syndrome 10/20/2014    PCP: Jolinda Norene HERO, DO  REFERRING PROVIDER: Jolinda Norene HERO, DO  REFERRING DIAG: [phone] (ICD-10-CM) - Left leg swelling  THERAPY DIAG:  M79.89 (ICD-10-CM) - Left leg swelling  Rationale for Evaluation and Treatment: Rehabilitation  ONSET DATE: chronic  SUBJECTIVE:  SUBJECTIVE STATEMENT:  Pt states she is doing well today.  Just with her normal stiffness and discomfort in Lt knee.   Evaluation:  Kristina Mclaughlin states that she had arthroscopic surgery five years ago not long after this she had increased swelling.  She asked her current therapist who is now seeing her for her TKR if her Lt leg could have lymphedema.  She states it is difficult to have pants that fit.  The pt states that she has been elevating her legs, exercising since mid June without having any relief.   She has used compression garment without success.  She is getting a JAS brace to assist in straightening her let.    PERTINENT HISTORY: Pt has hx of OA in Lt knee has had a past arthroscopic surgery followed by Lt TKR on 01/12/24.  Has hx of lipoma's and cervical surgeries.  PAIN:  Are you having pain? Yes NPRS scale: 3/10 Pain location: knee Pain orientation: Left  PAIN TYPE: throbbing and tight Pain description: intermittent  Aggravating factors: staying up to long  Relieving factors: elevate and ice  note TKR in June   PRECAUTIONS: None  RED FLAGS: None   WEIGHT BEARING RESTRICTIONS: No  FALLS:  Has patient fallen in last 6 months? 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  COGNITION: Overall cognitive status: Within functional limits for tasks assessed   PALPATION: No induration noted but positive edema   OBSERVATIONS / OTHER ASSESSMENTS: negative stemmer sign;   LYMPHEDEMA ASSESSMENTS:    LE LANDMARK RIGHT eval  At groin   30 cm proximal to suprapatella   20 cm proximal to suprapatella 64  10 cm proximal to suprapatella 57.5  At midpatella / popliteal crease 44.3  30 cm proximal to floor at lateral plantar foot 40.5  20 cm proximal to floor at lateral plantar foot 25.9  10 cm proximal to floor at lateral plantar foot 22.4  Circumference of ankle/heel 31.2  5 cm proximal to 1st MTP joint 21.6  Across MTP joint 22.3  Around proximal great toe 7.5  (Blank rows = not tested)  LE LANDMARK LEFT eval  At groin   30 cm proximal to suprapatella   20 cm proximal to suprapatella 65.2  10 cm proximal to suprapatella 61.8  At midpatella / popliteal crease 52.3  30 cm  proximal to floor at lateral plantar foot 40.9  20 cm proximal to floor at lateral plantar foot 28.2  10 cm proximal to floor at lateral plantar foot 23.9  Circumference of ankle/heel 31.3  5 cm proximal to 1st MTP joint 20.6  Across MTP joint 22.2  Around proximal great toe 7.7  (Blank rows = not tested)    TODAY'S TREATMENT:  DATE:  03/14/24 Manual:  decongestive techniques for Lt LE.  Treatment included deep and superficial abdominal nodes, lumbar nodes, clavicular.  Inguinal-axillary anastomosis Education provided regarding purpose of technique/rationale.  Instructed to bring garments next session   03/11/24 Evaluation Explanation that most likely pt does not have lymphedema, however, lymphedema techniques will be able to assist her in decreasing the edema in her Lt LE.      PATIENT EDUCATION:  Education details: HEP/ where to obtain capri compression,  next treatment we will begin manual lymphatic techniques.  Person educated: Patient Education method: Explanation, Demonstration, and Handouts Education comprehension: verbalized understanding and returned demonstration  HOME EXERCISE PROGRAM: Access Code: [email or web address] URL: [email or web address] Date: 03/11/2024 Prepared by: Montie Metro  Exercises - Seated Diaphragmatic Breathing  - 1 x daily - 7 x weekly - 10 reps - 5 hold - Seated Cervical Sidebending AROM  - 1 x daily - 7 x weekly - 1 sets - 10 reps - 2-3 hold - Seated Cervical Rotation AROM  - 1 x daily - 7 x weekly - 1 sets - 10 reps - 2-3 hold - Seated Cervical Extension AROM  - 1 x daily - 7 x weekly - 1 sets - 10 reps - 2-3 hold - Seated Cervical Retraction  - 1 x daily - 7 x weekly - 1 sets - 10 reps - 2-3 hold - Shoulder Rolls in Sitting  - 1 x daily - 7 x weekly - 1 sets - 10 reps - 2-3 hold - Seated Sidebending Arms  Overhead  - 1 x daily - 7 x weekly - 1 sets - 10 reps - 2-3 hold - Seated Hip Abduction  - 1 x daily - 7 x weekly - 1 sets - 10 reps - 2-3 hold - Seated Long Arc Quad  - 1 x daily - 7 x weekly - 1 sets - 10 reps - 2-3 hold - Seated Heel Toe Raises  - 1 x daily - 7 x weekly - 1 sets - 10 reps - 2-3 hold - Seated Toe Curl  - 1 x daily - 7 x weekly - 1 sets - 10 reps - 2-3 hold  ASSESSMENT:  CLINICAL IMPRESSION: Began manual lymph drainage for Lt LE.  Pt was able to assume prone to complete posteriorly.  Pt reported overall less stiffness and edema in Lt following.  Induration present on either side of scar and just superior into thigh, however lessened following manual.  Pt should receive her capri garments prior to next appt.  Unsure if she will also bring knee highs to don over.  Pt with no other questions/concerns this session.  Pt will continue to benefit in skilled PT for manual techniques to decrease her edema in order to allow pt to improve her knee flexion as well as to be able to wear pants again.   OBJECTIVE IMPAIRMENTS: Abnormal gait, decreased activity tolerance, decreased ROM, decreased strength, increased edema, and pain.   ACTIVITY LIMITATIONS: dressing, hygiene/grooming, and locomotion level  PARTICIPATION LIMITATIONS: shopping and community activity  PERSONAL FACTORS: Time since onset of injury/illness/exacerbation and 1-2 comorbidities: recent TKR are also affecting patient's functional outcome.   REHAB POTENTIAL: Good  CLINICAL DECISION MAKING: Stable/uncomplicated  EVALUATION COMPLEXITY: Moderate  GOALS: Goals reviewed with patient? Yes  SHORT TERM GOALS: Target date: 03/25/24  Pt to have lost 2 cm from Lt LE at knee and 10 cm proximal to knee area to make wearing pants easier for pt and allow pt to  bend her knee easier.  Baseline: Goal status: INITIAL  2.  Pt be completing HEP to increase lymphatic circulation to aid in decreasing edema in Lt knee Baseline:   Goal status: INITIAL  3.  Pt to be wearing compression capri in order to improve lymphatic circulation to aid in decreasing edema in Lt knee  Baseline:  Goal status: INITIAL   LONG TERM GOALS: Target date: 04/11/23  Pt to have lost 3-4 cm from Lt LE at knee and 10 cm proximal to knee area to make wearing pants easier for pt and allow pt to bend her knee easier.  Baseline:  Goal status: INITIAL  2.  Pt to be I in self manual techniques to improve lymphatic circulation to decrease edema in Lt knee area.  Baseline:  Goal status: INITIAL  PLAN:  PT FREQUENCY: 2x/week  PT DURATION: 4 weeks  PLANNED INTERVENTIONS: 97110-Therapeutic exercises, 97535- Self Care, and 02859- Manual therapy  PLAN FOR NEXT SESSION: Continue manual techniques, don compression capri immediately after once obtained, answer any questions on exercises and teach self manual techniques.    Greig KATHEE Fuse, PTA/CLT [facility] Ph: [phone]  03/14/2024, 1:51 PM

## 2024-03-16 ENCOUNTER — Encounter (HOSPITAL_COMMUNITY): Admitting: Physical Therapy

## 2024-03-18 ENCOUNTER — Encounter (HOSPITAL_COMMUNITY): Admitting: Physical Therapy

## 2024-03-18 ENCOUNTER — Ambulatory Visit (HOSPITAL_COMMUNITY): Admitting: Physical Therapy

## 2024-03-18 DIAGNOSIS — Z96652 Presence of left artificial knee joint: Secondary | ICD-10-CM | POA: Diagnosis not present

## 2024-03-18 DIAGNOSIS — R600 Localized edema: Secondary | ICD-10-CM | POA: Diagnosis not present

## 2024-03-18 NOTE — Therapy (Signed)
 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, lateral back   Depression    Essential hypertension 10/29/2015   GERD (gastroesophageal reflux disease)    Hypertension    Insomnia    Migraine headache    Tear of medial meniscus of knee 09/06/2018   Past Surgical History:  Procedure Laterality Date   ABDOMINAL HYSTERECTOMY     BACK SURGERY     BREAST SURGERY     biopsy x2; reduction   EXAMINATION UNDER ANESTHESIA  08/27/2012   LIPOMA EXCISION Right 07/03/2021   Procedure: EXCISION LIPOMA; ANKLE;  Surgeon: Kallie Manuelita BROCKS, MD;  Location: [facility];  Service: General;  Laterality: Right;   LIPOMA EXCISION N/A 07/03/2021   Procedure: EXCISION LIPOMA; SUPRAPUBIC;  Surgeon: Kallie Manuelita BROCKS, MD;  Location: [facility];  Service: General;  Laterality: N/A;   MASS EXCISION Left 07/03/2021   Procedure: EXCISION LIPOMA; ANKLE;  Surgeon: Kallie Manuelita BROCKS, MD;  Location: [facility];  Service: General;  Laterality: Left;   MASS EXCISION Left 07/03/2021   Procedure: EXCISION OF LIPOMA; ARM;  Surgeon: Kallie Manuelita BROCKS, MD;  Location: [facility];  Service: General;  Laterality: Left;   NECK SURGERY     Mass removed   REDUCTION MAMMAPLASTY Bilateral 2002   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   XI ROBOTIC ASSISTED HIATAL HERNIA REPAIR N/A 11/18/2022   Procedure: ROBOTIC HIATAL HERNIA REPAIR;   Surgeon: Lyndel Deward PARAS, MD;  Location: [facility];  Service: General;  Laterality: N/A;   Patient Active Problem List   Diagnosis Date Noted   Non-recurrent acute serous otitis media of left ear 05/11/2023   Hiatal hernia 11/18/2022   Gastroesophageal reflux disease 02/10/2022   Chronic superficial gastritis without bleeding 02/10/2022   Fatty liver 02/10/2022   Allergic rhinitis due to animal (cat) (dog) hair and dander 01/20/2022   Cough variant asthma 01/20/2022   Chronic constipation 09/18/2021   Neuropathic pain 08/22/2021   Lipoma of foot 06/11/2021   Lipoma of upper arm 06/11/2021   Sensorineural hearing loss (SNHL) of left ear with unrestricted hearing of right ear 12/11/2020   Lipoma of abdominal wall 12/15/2019   Breast lipoma 12/15/2019   Lipoma of both lower extremities 12/15/2019   Primary insomnia 05/11/2018   Unilateral primary osteoarthritis, left knee 05/11/2018   Hemorrhoids 01/22/2018   Essential hypertension 10/29/2015   Upper airway cough syndrome 10/20/2014    PCP: Jolinda Norene HERO, DO  REFERRING PROVIDER: Jolinda Norene HERO, DO  REFERRING DIAG: [phone] (ICD-10-CM) - Left leg swelling  THERAPY DIAG:  M79.89 (ICD-10-CM) - Left leg swelling  Rationale for Evaluation and Treatment: Rehabilitation  ONSET DATE: chronic  SUBJECTIVE:  SUBJECTIVE STATEMENT: [name] states that she had arthroscopic surgery five years ago not long after this she had increased swelling.  She asked her current therapist who is now seeing her for her TKR if her Lt leg could have lymphedema.  She states it is difficult to have pants that fit.  The pt states that she has been elevating her legs, exercising since mid June without having any relief.  She has used compression garment without  success.  She is getting a JAS brace to assist in straightening her let.    PERTINENT HISTORY: Pt has hx of OA in Lt knee has had a past arthroscopic surgery followed by Lt TKR on 01/12/24.  Has hx of lipoma's and cervical surgeries.  PAIN:  Are you having pain? Yes NPRS scale: 3/10 Pain location: knee Pain orientation: Left  PAIN TYPE: throbbing and tight Pain description: intermittent  Aggravating factors: staying up to long  Relieving factors: elevate and ice  note TKR in June   PRECAUTIONS: None  RED FLAGS: None   WEIGHT BEARING RESTRICTIONS: No  FALLS:  Has patient fallen in last 6 months? 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  COGNITION: Overall cognitive status: Within functional limits for tasks assessed   PALPATION: No induration noted but positive edema   OBSERVATIONS / OTHER ASSESSMENTS: negative stemmer sign;   LYMPHEDEMA ASSESSMENTS:    LE LANDMARK RIGHT eval  At groin   30 cm proximal to suprapatella   20 cm proximal to suprapatella 64  10 cm proximal to suprapatella 57.5  At midpatella / popliteal crease 44.3  30 cm proximal to floor at lateral plantar foot 40.5  20 cm proximal to floor at lateral plantar foot 25.9  10 cm proximal to floor at lateral plantar foot 22.4  Circumference of ankle/heel 31.2  5 cm proximal to 1st MTP joint 21.6  Across MTP joint 22.3  Around proximal great toe 7.5  (Blank rows = not tested)  LE LANDMARK LEFT eval  At groin   30 cm proximal to suprapatella   20 cm proximal to suprapatella 65.2  10 cm proximal to suprapatella 61.8  At midpatella / popliteal crease 52.3  30 cm proximal to floor at lateral plantar foot  40.9  20 cm proximal to floor at lateral plantar foot 28.2  10 cm proximal to floor at lateral plantar foot 23.9  Circumference of ankle/heel 31.3  5 cm proximal to 1st MTP joint 20.6  Across MTP joint 22.2  Around proximal great toe 7.7  (Blank rows = not tested)    TODAY'S TREATMENT:  DATE: 03/18/24 PT received decongestive techniques to include supraclavicular, deep and superficial abdominal, routing Lt inguinal to Lt axillary and Intra inguinal anastomosis followed by Lt LE.  Posterior completed in prone.   Instruction on self manual techniques.    PATIENT EDUCATION:  03/18/24:  self manual techniques, demonstrated, pt completed and therapist gave pt handout on self manual  Education details: HEP/ where to obtain capri compression,  next treatment we will begin manual lymphatic techniques.  Person educated: Patient Education method: Explanation, Demonstration, and Handouts Education comprehension: verbalized understanding and returned demonstration  HOME EXERCISE PROGRAM: Access Code: [email or web address] URL: [email or web address] Date: 03/11/2024 Prepared by: Montie Metro  Exercises - Seated Diaphragmatic Breathing  - 1 x daily - 7 x weekly - 10 reps - 5 hold - Seated Cervical Sidebending AROM  - 1 x daily - 7 x weekly - 1 sets - 10 reps - 2-3 hold - Seated Cervical Rotation AROM  - 1 x daily - 7 x weekly - 1 sets - 10 reps - 2-3 hold - Seated Cervical Extension AROM  - 1 x daily - 7 x weekly - 1 sets - 10 reps - 2-3 hold - Seated Cervical Retraction  - 1 x daily - 7 x weekly - 1 sets - 10 reps - 2-3 hold - Shoulder Rolls in Sitting  - 1 x daily - 7 x weekly - 1 sets - 10 reps - 2-3 hold - Seated Sidebending Arms Overhead  - 1 x daily - 7 x weekly - 1 sets - 10 reps - 2-3 hold - Seated Hip Abduction  - 1 x daily - 7 x weekly - 1 sets - 10  reps - 2-3 hold - Seated Long Arc Quad  - 1 x daily - 7 x weekly - 1 sets - 10 reps - 2-3 hold - Seated Heel Toe Raises  - 1 x daily - 7 x weekly - 1 sets - 10 reps - 2-3 hold - Seated Toe Curl  - 1 x daily - 7 x weekly - 1 sets - 10 reps - 2-3 hold  ASSESSMENT:  CLINICAL IMPRESSION: PT received manual decongestive techniques to Lt LE . The therapist instructed pt in self manual techniques.   t Ms. Pribble will benefit in skilled PT for manual techniques to decrease her edema in order to allow pt to improve her knee flexion as well as to be able to wear pants again.   OBJECTIVE IMPAIRMENTS: Abnormal gait, decreased activity tolerance, decreased ROM, decreased strength, increased edema, and pain.   ACTIVITY LIMITATIONS: dressing, hygiene/grooming, and locomotion level  PARTICIPATION LIMITATIONS: shopping and community activity  PERSONAL FACTORS: Time since onset of injury/illness/exacerbation and 1-2 comorbidities: recent TKR are also affecting patient's functional outcome.   REHAB POTENTIAL: Good  CLINICAL DECISION MAKING: Stable/uncomplicated  EVALUATION COMPLEXITY: Moderate  GOALS: Goals reviewed with patient? Yes  SHORT TERM GOALS: Target date: 03/25/24  Pt to have lost 2 cm from Lt LE at knee and 10 cm proximal to knee area to make wearing pants easier for pt and allow pt to bend her knee easier.  Baseline: Goal status: ongoing   2.  Pt be completing HEP to increase lymphatic circulation to aid in decreasing edema in Lt knee Baseline:  Goal status: met  3.  Pt to be wearing compression capri in order to improve lymphatic circulation to aid in decreasing edema in Lt knee  Baseline:  Goal status: met   LONG TERM GOALS: Target date: 04/11/23  Pt to have lost 3-4 cm from Lt LE at knee and 10 cm proximal to knee area to make wearing pants easier for pt and allow pt to bend her knee easier.  Baseline:  Goal status: ongoing   2.  Pt to be I in self manual techniques to  improve lymphatic circulation to decrease edema in Lt knee area.  Baseline:  Goal status:  ongoing  PLAN:  PT FREQUENCY: 2x/week  PT DURATION: 4 weeks  PLANNED INTERVENTIONS: 97110-Therapeutic exercises, 97535- Self Care, and 02859- Manual therapy  PLAN FOR NEXT SESSION: measure   Taitum Menton, PT CLT [phone]  03/18/2024, 4:59 PM

## 2024-03-21 ENCOUNTER — Ambulatory Visit (HOSPITAL_COMMUNITY): Admitting: Physical Therapy

## 2024-03-21 DIAGNOSIS — R600 Localized edema: Secondary | ICD-10-CM

## 2024-03-21 NOTE — Therapy (Signed)
 OUTPATIENT PHYSICAL THERAPY LYMPHEDEMA Treatment  Patient Name: Kristina Mclaughlin MRN: [id number] DOB:1950-11-26, 73 y.o., female Today's Date: 03/21/2024  END OF SESSION:  PT End of Session - 03/21/24 1331     Visit Number 4    Number of Visits 8    Date for PT Re-Evaluation 04/10/24    Authorization Type [employer]et[employer]    Progress Note Due on Visit 8    PT Start Time 1245    PT Stop Time 1331    PT Time Calculation (min) 46 min    Activity Tolerance Patient tolerated treatment well    Behavior During Therapy [facility] for tasks assessed/performed           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, lateral back   Depression    Essential hypertension 10/29/2015   GERD (gastroesophageal reflux disease)    Hypertension    Insomnia    Migraine headache    Tear of medial meniscus of knee 09/06/2018   Past Surgical History:  Procedure Laterality Date   ABDOMINAL HYSTERECTOMY     BACK SURGERY     BREAST SURGERY     biopsy x2; reduction   EXAMINATION UNDER ANESTHESIA  08/27/2012   LIPOMA EXCISION Right 07/03/2021   Procedure: EXCISION LIPOMA; ANKLE;  Surgeon: Kallie Manuelita BROCKS, MD;  Location: [facility];  Service: General;  Laterality: Right;   LIPOMA EXCISION N/A 07/03/2021   Procedure: EXCISION LIPOMA; SUPRAPUBIC;  Surgeon: Kallie Manuelita BROCKS, MD;  Location: [facility];  Service: General;  Laterality: N/A;   MASS EXCISION Left 07/03/2021   Procedure: EXCISION LIPOMA; ANKLE;  Surgeon: Kallie Manuelita BROCKS, MD;  Location: [facility];  Service: General;  Laterality: Left;   MASS EXCISION Left 07/03/2021   Procedure: EXCISION OF LIPOMA; ARM;  Surgeon: Kallie Manuelita BROCKS, MD;  Location: [facility];  Service: General;  Laterality: Left;   NECK SURGERY     Mass removed   REDUCTION MAMMAPLASTY Bilateral 2002   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   XI ROBOTIC ASSISTED HIATAL HERNIA REPAIR N/A 11/18/2022   Procedure:  ROBOTIC HIATAL HERNIA REPAIR;  Surgeon: Lyndel Deward PARAS, MD;  Location: [facility];  Service: General;  Laterality: N/A;   Patient Active Problem List   Diagnosis Date Noted   Non-recurrent acute serous otitis media of left ear 05/11/2023   Hiatal hernia 11/18/2022   Gastroesophageal reflux disease 02/10/2022   Chronic superficial gastritis without bleeding 02/10/2022   Fatty liver 02/10/2022   Allergic rhinitis due to animal (cat) (dog) hair and dander 01/20/2022   Cough variant asthma 01/20/2022   Chronic constipation 09/18/2021   Neuropathic pain 08/22/2021   Lipoma of foot 06/11/2021   Lipoma of upper arm 06/11/2021   Sensorineural hearing loss (SNHL) of left ear with unrestricted hearing of right ear 12/11/2020   Lipoma of abdominal wall 12/15/2019   Breast lipoma 12/15/2019   Lipoma of both lower extremities 12/15/2019   Primary insomnia 05/11/2018   Unilateral primary osteoarthritis, left knee 05/11/2018   Hemorrhoids 01/22/2018   Essential hypertension 10/29/2015   Upper airway cough syndrome 10/20/2014    PCP: Jolinda Norene HERO, DO  REFERRING PROVIDER: Jolinda Norene HERO, DO  REFERRING DIAG: [phone] (ICD-10-CM) - Left leg swelling  THERAPY DIAG:  M79.89 (ICD-10-CM) - Left leg swelling  Rationale for Evaluation and Treatment: Rehabilitation  ONSET DATE: chronic  SUBJECTIVE:  SUBJECTIVE STATEMENT: PT states that she has been completing her self manual and her exercises.   PERTINENT HISTORY: Pt has hx of OA in Lt knee has had a past arthroscopic surgery followed by Lt TKR on 01/12/24.  Has hx of lipoma's and cervical surgeries.  PAIN:  Are you having pain? Yes NPRS scale: 3/10 Pain location: knee Pain orientation: Left  PAIN TYPE: throbbing and tight Pain description:  intermittent  Aggravating factors: staying up to long  Relieving factors: elevate and ice  note TKR in June   PRECAUTIONS: None  RED FLAGS: None   WEIGHT BEARING RESTRICTIONS: No  FALLS:  Has patient fallen in last 6 months? 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  COGNITION: Overall cognitive status: Within functional limits for tasks assessed   PALPATION: No induration noted but positive edema   OBSERVATIONS / OTHER ASSESSMENTS: negative stemmer sign;   LYMPHEDEMA ASSESSMENTS:    LE LANDMARK RIGHT eval  At groin   30 cm proximal to suprapatella   20 cm proximal to suprapatella 64  10 cm proximal to suprapatella 57.5  At midpatella / popliteal crease 44.3  30 cm proximal to floor at lateral plantar foot 40.5  20 cm proximal to floor at lateral plantar foot 25.9  10 cm proximal to floor at lateral plantar foot 22.4  Circumference of ankle/heel 31.2  5 cm proximal to 1st MTP joint 21.6  Across MTP joint 22.3  Around proximal great toe 7.5  (Blank rows = not tested)  LE LANDMARK LEFT eval 03/21/24  At groin    30 cm proximal to suprapatella    20 cm proximal to suprapatella 65.2 64.2  10 cm proximal to suprapatella 61.8 60.8  At midpatella / popliteal crease 52.3 51.3  30 cm proximal to floor at lateral plantar foot 40.9 40.8  20 cm proximal to floor at lateral plantar foot 28.2 27.3  10 cm proximal to floor at lateral plantar foot 23.9 23.7  Circumference of ankle/heel 31.3   5 cm proximal to 1st MTP joint 20.6   Across MTP joint 22.2   Around proximal great toe 7.7   (Blank rows = not tested)    TODAY'S TREATMENT:                                                                                                                               DATE: 03/21/24 PT received decongestive techniques to include supraclavicular, deep and superficial abdominal, routing Lt inguinal to Lt axillary and Intra inguinal anastomosis followed by Lt LE.  Posterior completed in prone.     PATIENT EDUCATION:  03/18/24:  self manual techniques, demonstrated, pt completed and therapist  gave pt handout on self manual  Education details: HEP/ where to obtain capri compression,  next treatment we will begin manual lymphatic techniques.  Person educated: Patient Education method: Explanation, Demonstration, and Handouts Education comprehension: verbalized understanding and returned demonstration  HOME EXERCISE PROGRAM: Access Code: [email or web address] URL: [email or web address] Date: 03/11/2024 Prepared by: Montie Metro  Exercises - Seated Diaphragmatic Breathing  - 1 x daily - 7 x weekly - 10 reps - 5 hold - Seated Cervical Sidebending AROM  - 1 x daily - 7 x weekly - 1 sets - 10 reps - 2-3 hold - Seated Cervical Rotation AROM  - 1 x daily - 7 x weekly - 1 sets - 10 reps - 2-3 hold - Seated Cervical Extension AROM  - 1 x daily - 7 x weekly - 1 sets - 10 reps - 2-3 hold - Seated Cervical Retraction  - 1 x daily - 7 x weekly - 1 sets - 10 reps - 2-3 hold - Shoulder Rolls in Sitting  - 1 x daily - 7 x weekly - 1 sets - 10 reps - 2-3 hold - Seated Sidebending Arms Overhead  - 1 x daily - 7 x weekly - 1 sets - 10 reps - 2-3 hold - Seated Hip Abduction  - 1 x daily - 7 x weekly - 1 sets - 10 reps - 2-3 hold - Seated Long Arc Quad  - 1 x daily - 7 x weekly - 1 sets - 10 reps - 2-3 hold - Seated Heel Toe Raises  - 1 x daily - 7 x weekly - 1 sets - 10 reps - 2-3 hold - Seated Toe Curl  - 1 x daily - 7 x weekly - 1 sets - 10 reps - 2-3 hold  ASSESSMENT:  CLINICAL IMPRESSION: PT measured with generally losing one cm from all areas that are edematous.  PT continues  to receive manual decongestive techniques to Lt LE . The therapist instructed pt in self manual techniques.   Ms. Goedecke will benefit in skilled PT for manual techniques to decrease her edema in order to allow pt to improve her knee flexion as well as to be able to wear pants again.   OBJECTIVE IMPAIRMENTS: Abnormal gait, decreased activity tolerance, decreased ROM, decreased strength, increased edema, and pain.   ACTIVITY LIMITATIONS: dressing, hygiene/grooming, and locomotion level  PARTICIPATION LIMITATIONS: shopping and community activity  PERSONAL FACTORS: Time since onset of injury/illness/exacerbation and 1-2 comorbidities: recent TKR are also affecting patient's functional outcome.   REHAB POTENTIAL: Good  CLINICAL DECISION MAKING: Stable/uncomplicated  EVALUATION COMPLEXITY: Moderate  GOALS: Goals reviewed with patient? Yes  SHORT TERM GOALS: Target date: 03/25/24  Pt to have lost 2 cm from Lt LE at knee and 10 cm proximal to knee area to make wearing pants easier for pt and allow pt to bend her knee easier.  Baseline: Goal status: ongoing   2.  Pt be completing HEP to increase lymphatic circulation to aid in decreasing edema in Lt knee Baseline:  Goal status: met  3.  Pt to be wearing compression capri in order to improve lymphatic circulation to aid in decreasing edema in Lt knee  Baseline:  Goal status: met   LONG TERM GOALS: Target date: 04/11/23  Pt to have lost 3-4 cm from Lt LE at knee and 10 cm proximal to knee area to make wearing pants easier for pt and allow pt to bend her knee easier.  Baseline:  Goal status: ongoing  2.  Pt to be I in self manual techniques to improve lymphatic circulation to decrease edema in Lt knee area.  Baseline:  Goal status:  ongoing  PLAN:  PT FREQUENCY: 2x/week  PT DURATION: 4 weeks  PLANNED INTERVENTIONS: 97110-Therapeutic exercises, 97535- Self Care, and 02859- Manual therapy  PLAN FOR NEXT SESSION:  measure   Lacrystal Barbe, PT CLT [phone]  03/21/2024, 1:32 PM

## 2024-03-23 ENCOUNTER — Encounter (HOSPITAL_COMMUNITY): Admitting: Physical Therapy

## 2024-03-25 ENCOUNTER — Encounter (HOSPITAL_COMMUNITY): Payer: Self-pay

## 2024-03-25 ENCOUNTER — Ambulatory Visit (HOSPITAL_COMMUNITY)

## 2024-03-25 DIAGNOSIS — R600 Localized edema: Secondary | ICD-10-CM | POA: Diagnosis not present

## 2024-03-25 DIAGNOSIS — G8929 Other chronic pain: Secondary | ICD-10-CM

## 2024-03-25 NOTE — Therapy (Signed)
 OUTPATIENT PHYSICAL THERAPY LYMPHEDEMA Treatment  Patient Name: Kristina Mclaughlin MRN: [id number] DOB:October 11, 1950, 73 y.o., female Today's Date: 03/25/2024  END OF SESSION:  PT End of Session - 03/25/24 1435     Visit Number 5    Number of Visits 8    Date for PT Re-Evaluation 04/10/24    Authorization Type [employer]    Progress Note Due on Visit 8    PT Start Time 1345    PT Stop Time 1431    PT Time Calculation (min) 46 min    Activity Tolerance Patient tolerated treatment well    Behavior During Therapy WFL for tasks assessed/performed           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, lateral back   Depression    Essential hypertension 10/29/2015   GERD (gastroesophageal reflux disease)    Hypertension    Insomnia    Migraine headache    Tear of medial meniscus of knee 09/06/2018   Past Surgical History:  Procedure Laterality Date   ABDOMINAL HYSTERECTOMY     BACK SURGERY     BREAST SURGERY     biopsy x2; reduction   EXAMINATION UNDER ANESTHESIA  08/27/2012   LIPOMA EXCISION Right 07/03/2021   Procedure: EXCISION LIPOMA; ANKLE;  Surgeon: Kallie Manuelita BROCKS, MD;  Location: [facility];  Service: General;  Laterality: Right;   LIPOMA EXCISION N/A 07/03/2021   Procedure: EXCISION LIPOMA; SUPRAPUBIC;  Surgeon: Kallie Manuelita BROCKS, MD;  Location: [facility];  Service: General;  Laterality: N/A;   MASS EXCISION Left 07/03/2021   Procedure: EXCISION LIPOMA; ANKLE;  Surgeon: Kallie Manuelita BROCKS, MD;  Location: [facility];  Service: General;  Laterality: Left;   MASS EXCISION Left 07/03/2021   Procedure: EXCISION OF LIPOMA; ARM;  Surgeon: Kallie Manuelita BROCKS, MD;  Location: [facility];  Service: General;  Laterality: Left;   NECK SURGERY     Mass removed   REDUCTION MAMMAPLASTY Bilateral 2002   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   XI ROBOTIC ASSISTED HIATAL HERNIA REPAIR N/A 11/18/2022   Procedure:  ROBOTIC HIATAL HERNIA REPAIR;  Surgeon: Lyndel Deward PARAS, MD;  Location: [facility];  Service: General;  Laterality: N/A;   Patient Active Problem List   Diagnosis Date Noted   Non-recurrent acute serous otitis media of left ear 05/11/2023   Hiatal hernia 11/18/2022   Gastroesophageal reflux disease 02/10/2022   Chronic superficial gastritis without bleeding 02/10/2022   Fatty liver 02/10/2022   Allergic rhinitis due to animal (cat) (dog) hair and dander 01/20/2022   Cough variant asthma 01/20/2022   Chronic constipation 09/18/2021   Neuropathic pain 08/22/2021   Lipoma of foot 06/11/2021   Lipoma of upper arm 06/11/2021   Sensorineural hearing loss (SNHL) of left ear with unrestricted hearing of right ear 12/11/2020   Lipoma of abdominal wall 12/15/2019   Breast lipoma 12/15/2019   Lipoma of both lower extremities 12/15/2019   Primary insomnia 05/11/2018   Unilateral primary osteoarthritis, left knee 05/11/2018   Hemorrhoids 01/22/2018   Essential hypertension 10/29/2015   Upper airway cough syndrome 10/20/2014    PCP: Jolinda Norene HERO, DO  REFERRING PROVIDER: Jolinda Norene HERO, DO  REFERRING DIAG: [phone] (ICD-10-CM) - Left leg swelling  THERAPY DIAG:  M79.89 (ICD-10-CM) - Left leg swelling  Rationale for Evaluation and Treatment: Rehabilitation  ONSET DATE: chronic  SUBJECTIVE:  SUBJECTIVE STATEMENT:  Pt stated she has been busy today.  Used JAS brace this morning, exercises, and self manual.  Arrived wearing compression capris.  Increased pain in knee today.  PERTINENT HISTORY: Pt has hx of OA in Lt knee has had a past arthroscopic surgery followed by Lt TKR on 01/12/24.  Has hx of lipoma's and cervical surgeries.  PAIN:  Are you having pain? Yes NPRS scale: 5/10 Pain location:  knee Pain orientation: Left  PAIN TYPE: throbbing and tight Pain description: intermittent  Aggravating factors: staying up to long  Relieving factors: elevate and ice  note TKR in June   PRECAUTIONS: None  RED FLAGS: None   WEIGHT BEARING RESTRICTIONS: No  FALLS:  Has patient fallen in last 6 months? 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  COGNITION: Overall cognitive status: Within functional limits for tasks assessed   PALPATION: No induration noted but positive edema   OBSERVATIONS / OTHER ASSESSMENTS: negative stemmer sign;   LYMPHEDEMA ASSESSMENTS:    LE LANDMARK RIGHT eval  At groin   30 cm proximal to suprapatella   20 cm proximal to suprapatella 64  10 cm proximal to suprapatella 57.5  At midpatella / popliteal crease 44.3  30 cm proximal to floor at lateral plantar foot 40.5  20 cm proximal to floor at lateral plantar foot 25.9  10 cm proximal to floor at lateral plantar foot 22.4  Circumference of ankle/heel 31.2  5 cm proximal to 1st MTP joint 21.6  Across MTP joint 22.3  Around proximal great toe 7.5  (Blank rows = not tested)  LE LANDMARK LEFT eval 03/21/24  At groin    30 cm proximal to suprapatella    20 cm proximal to suprapatella 65.2 64.2  10 cm proximal to suprapatella 61.8 60.8  At midpatella / popliteal crease 52.3 51.3  30 cm proximal to floor at lateral plantar foot 40.9 40.8  20 cm proximal to floor at lateral plantar foot 28.2 27.3  10 cm proximal to floor at lateral plantar foot 23.9 23.7  Circumference of ankle/heel 31.3   5 cm proximal to 1st MTP joint 20.6   Across MTP joint 22.2   Around proximal great toe 7.7   (Blank rows =  not tested)    TODAY'S TREATMENT:                                                                                                                              DATE:  03/25/24: Manual decongestive techniques to include supraclavicular, deep and superficial abdominal, routing Lt inguinal to Lt axillary and Intra inguinal anastomosis followed by Lt LE.  Posterior completed in prone.  Educated benefit of knee high for ankle edema and varicose veins.  Measurements taken and given ETI information.  03/21/24 PT received decongestive techniques to include supraclavicular, deep and superficial abdominal, routing Lt inguinal to Lt axillary and Intra inguinal anastomosis followed by Lt LE.  Posterior completed in prone.     PATIENT EDUCATION:  03/18/24:  self manual techniques, demonstrated, pt completed and therapist gave pt handout on self manual  Education details: HEP/ where to obtain capri compression,  next treatment we will begin manual lymphatic techniques.  Person educated: Patient Education method: Explanation, Demonstration, and Handouts Education comprehension: verbalized understanding and returned demonstration  HOME EXERCISE PROGRAM: Access Code: [email or web address] URL: [email or web address] Date: 03/11/2024 Prepared by: Montie Metro  Exercises - Seated Diaphragmatic Breathing  - 1 x daily - 7 x weekly - 10 reps - 5 hold - Seated Cervical Sidebending AROM  - 1 x daily - 7 x weekly - 1 sets - 10 reps - 2-3 hold - Seated Cervical Rotation AROM  - 1 x daily - 7 x weekly - 1 sets - 10 reps - 2-3 hold - Seated Cervical Extension AROM  - 1 x daily - 7 x weekly - 1 sets - 10 reps - 2-3 hold - Seated Cervical Retraction  - 1 x daily - 7 x weekly - 1 sets - 10 reps - 2-3 hold - Shoulder Rolls in Sitting  - 1 x daily - 7 x weekly - 1 sets - 10 reps - 2-3 hold - Seated Sidebending Arms Overhead  - 1 x daily - 7 x weekly - 1 sets - 10 reps - 2-3 hold - Seated Hip Abduction  - 1  x daily - 7 x weekly - 1 sets - 10 reps - 2-3 hold - Seated Long Arc Quad  - 1 x daily - 7 x weekly - 1 sets - 10 reps - 2-3 hold - Seated Heel Toe Raises  - 1 x daily - 7 x weekly - 1 sets - 10 reps - 2-3 hold - Seated Toe Curl  - 1 x daily - 7 x weekly - 1 sets - 10 reps - 2-3 hold  ASSESSMENT:  CLINICAL IMPRESSION:  Pt reports she has noticed edema and varicose veins in ankle.  Pt educated on benefits of wearing knee high with capris or thigh high compression garments.  Measurements taken and given handout for ETI.  Manual decongestive techniques complete for routing Lt inguinal to Lt axillary and Intra inguinal anastomosis followed by Lt LE.  Pt able to donn her capris with no assistance.  OBJECTIVE IMPAIRMENTS: Abnormal gait, decreased activity tolerance, decreased ROM, decreased strength, increased edema, and pain.   ACTIVITY LIMITATIONS: dressing, hygiene/grooming, and locomotion level  PARTICIPATION LIMITATIONS: shopping and community activity  PERSONAL FACTORS: Time since onset of injury/illness/exacerbation and 1-2 comorbidities: recent TKR are also affecting patient's functional outcome.   REHAB POTENTIAL: Good  CLINICAL DECISION MAKING: Stable/uncomplicated  EVALUATION COMPLEXITY: Moderate  GOALS: Goals reviewed with patient? Yes  SHORT TERM GOALS: Target date: 03/25/24  Pt to have lost 2 cm from Lt LE at knee and 10 cm proximal to knee area to make wearing pants easier for pt and allow pt to bend her knee easier.  Baseline: Goal status: ongoing   2.  Pt be completing HEP to increase lymphatic circulation to aid in decreasing edema in Lt knee Baseline:  Goal status: met  3.  Pt to be wearing compression capri in order to improve lymphatic  circulation to aid in decreasing edema in Lt knee  Baseline:  Goal status: met   LONG TERM GOALS: Target date: 04/11/23  Pt to have lost 3-4 cm from Lt LE at knee and 10 cm proximal to knee area to make wearing pants easier  for pt and allow pt to bend her knee easier.  Baseline:  Goal status: ongoing   2.  Pt to be I in self manual techniques to improve lymphatic circulation to decrease edema in Lt knee area.  Baseline:  Goal status:  ongoing  PLAN:  PT FREQUENCY: 2x/week  PT DURATION: 4 weeks  PLANNED INTERVENTIONS: 97110-Therapeutic exercises, 97535- Self Care, and 02859- Manual therapy  PLAN FOR NEXT SESSION: measure on Mondays.  Continue manual for Lt LE.  Augustin Mclean, LPTA/CLT; [facility] [phone]  03/25/2024, 2:35 PM

## 2024-03-28 ENCOUNTER — Ambulatory Visit (HOSPITAL_COMMUNITY): Admitting: Physical Therapy

## 2024-03-28 DIAGNOSIS — R600 Localized edema: Secondary | ICD-10-CM

## 2024-03-28 DIAGNOSIS — G8929 Other chronic pain: Secondary | ICD-10-CM

## 2024-03-28 NOTE — Therapy (Signed)
 OUTPATIENT PHYSICAL THERAPY LYMPHEDEMA Treatment  Patient Name: Kristina Mclaughlin MRN: [id number] DOB:[birth year], 73 y.o., female Today's Date: 03/28/2024  END OF SESSION:  PT End of Session - 03/28/24 1448     Visit Number 6    Number of Visits 8    Date for PT Re-Evaluation 04/10/24    Authorization Type [employer]    Progress Note Due on Visit 8    PT Start Time 1304    PT Stop Time 1357    PT Time Calculation (min) 53 min    Activity Tolerance Patient tolerated treatment well    Behavior During Therapy WFL for tasks assessed/performed           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, lateral back   Depression    Essential hypertension 10/29/2015   GERD (gastroesophageal reflux disease)    Hypertension    Insomnia    Migraine headache    Tear of medial meniscus of knee 09/06/2018   Past Surgical History:  Procedure Laterality Date   ABDOMINAL HYSTERECTOMY     BACK SURGERY     BREAST SURGERY     biopsy x2; reduction   EXAMINATION UNDER ANESTHESIA  08/27/2012   LIPOMA EXCISION Right 07/03/2021   Procedure: EXCISION LIPOMA; ANKLE;  Surgeon: Kallie Manuelita BROCKS, MD;  Location: [facility];  Service: General;  Laterality: Right;   LIPOMA EXCISION N/A 07/03/2021   Procedure: EXCISION LIPOMA; SUPRAPUBIC;  Surgeon: Kallie Manuelita BROCKS, MD;  Location: [facility];  Service: General;  Laterality: N/A;   MASS EXCISION Left 07/03/2021   Procedure: EXCISION LIPOMA; ANKLE;  Surgeon: Kallie Manuelita BROCKS, MD;  Location: [facility];  Service: General;  Laterality: Left;   MASS EXCISION Left 07/03/2021   Procedure: EXCISION OF LIPOMA; ARM;  Surgeon: Kallie Manuelita BROCKS, MD;  Location: [facility];  Service: General;  Laterality: Left;   NECK SURGERY     Mass removed   REDUCTION MAMMAPLASTY Bilateral 2002   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   XI ROBOTIC ASSISTED HIATAL HERNIA REPAIR N/A 11/18/2022    Procedure: ROBOTIC HIATAL HERNIA REPAIR;  Surgeon: Lyndel Deward PARAS, MD;  Location: [facility];  Service: General;  Laterality: N/A;   Patient Active Problem List   Diagnosis Date Noted   Non-recurrent acute serous otitis media of left ear 05/11/2023   Hiatal hernia 11/18/2022   Gastroesophageal reflux disease 02/10/2022   Chronic superficial gastritis without bleeding 02/10/2022   Fatty liver 02/10/2022   Allergic rhinitis due to animal (cat) (dog) hair and dander 01/20/2022   Cough variant asthma 01/20/2022   Chronic constipation 09/18/2021   Neuropathic pain 08/22/2021   Lipoma of foot 06/11/2021   Lipoma of upper arm 06/11/2021   Sensorineural hearing loss (SNHL) of left ear with unrestricted hearing of right ear 12/11/2020   Lipoma of abdominal wall 12/15/2019   Breast lipoma 12/15/2019   Lipoma of both lower extremities 12/15/2019   Primary insomnia 05/11/2018   Unilateral primary osteoarthritis, left knee 05/11/2018   Hemorrhoids 01/22/2018   Essential hypertension 10/29/2015   Upper airway cough syndrome 10/20/2014    PCP: Jolinda Norene HERO, DO  REFERRING PROVIDER: Jolinda Norene HERO, DO  REFERRING DIAG: [phone] (ICD-10-CM) - Left leg swelling  THERAPY DIAG:  M79.89 (ICD-10-CM) - Left leg swelling  Rationale for Evaluation and Treatment: Rehabilitation  ONSET DATE: chronic  SUBJECTIVE:  SUBJECTIVE STATEMENT:  Pt states she is doing well.  Capris are donned and reports compliance with these.   PERTINENT HISTORY: Pt has hx of OA in Lt knee has had a past arthroscopic surgery followed by Lt TKR on 01/12/24.  Has hx of lipoma's and cervical surgeries.  PAIN:  Are you having pain? Yes NPRS scale: 5/10 Pain location: knee Pain orientation: Left  PAIN TYPE: throbbing and tight Pain  description: intermittent  Aggravating factors: staying up to long  Relieving factors: elevate and ice  note TKR in June   PRECAUTIONS: None  RED FLAGS: None   WEIGHT BEARING RESTRICTIONS: No  FALLS:  Has patient fallen in last 6 months? 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  COGNITION: Overall cognitive status: Within functional limits for tasks assessed   PALPATION: No induration noted but positive edema   OBSERVATIONS / OTHER ASSESSMENTS: negative stemmer sign;   LYMPHEDEMA ASSESSMENTS:    LE LANDMARK RIGHT eval  At groin   30 cm proximal to suprapatella   20 cm proximal to suprapatella 64  10 cm proximal to suprapatella 57.5  At midpatella / popliteal crease 44.3  30 cm proximal to floor at lateral plantar foot 40.5  20 cm proximal to floor at lateral plantar foot 25.9  10 cm proximal to floor at lateral plantar foot 22.4  Circumference of ankle/heel 31.2  5 cm proximal to 1st MTP joint 21.6  Across MTP joint 22.3  Around proximal great toe 7.5  (Blank rows = not tested)  LE LANDMARK LEFT eval 03/21/24 8/25  At groin     30 cm proximal to suprapatella     20 cm proximal to suprapatella 65.2 64.2 64  10 cm proximal to suprapatella 61.8 60.8 60  At midpatella / popliteal crease 52.3 51.3 51  30 cm proximal to floor at lateral plantar foot 40.9 40.8 40  20 cm proximal to floor at lateral plantar foot 28.2 27.3 29  10  cm proximal to floor at lateral plantar foot 23.9 23.7 23.5  Circumference of ankle/heel 31.3  31  5  cm proximal to 1st MTP joint 20.6  22  Across MTP joint 22.2  21  Around proximal great toe 7.7  7  (Blank rows = not tested)    TODAY'S  TREATMENT:                                                                                                                              DATE:  03/28/24: Measurements (see above chart) Manual decongestive techniques to include supraclavicular, deep and superficial abdominal, routing Lt inguinal to Lt axillary and Intra inguinal anastomosis followed by Lt LE.  Posterior  completed in prone.   Measurements taken again for compression garments and given ETI information. Also given information for ordering bandaging if decides to do so.    03/25/24: Manual decongestive techniques to include supraclavicular, deep and superficial abdominal, routing Lt inguinal to Lt axillary and Intra inguinal anastomosis followed by Lt LE.  Posterior completed in prone.   Educated benefit of knee high for ankle edema and varicose veins.  Measurements taken and given ETI information.  03/21/24 PT received decongestive techniques to include supraclavicular, deep and superficial abdominal, routing Lt inguinal to Lt axillary and Intra inguinal anastomosis followed by Lt LE.  Posterior completed in prone.     PATIENT EDUCATION:  03/18/24:  self manual techniques, demonstrated, pt completed and therapist gave pt handout on self manual  Education details: HEP/ where to obtain capri compression,  next treatment we will begin manual lymphatic techniques.  Person educated: Patient Education method: Explanation, Demonstration, and Handouts Education comprehension: verbalized understanding and returned demonstration  HOME EXERCISE PROGRAM: Access Code: [email or web address] URL: [email or web address] Date: 03/11/2024 Prepared by: Montie Metro  Exercises - Seated Diaphragmatic Breathing  - 1 x daily - 7 x weekly - 10 reps - 5 hold - Seated Cervical Sidebending AROM  - 1 x daily - 7 x weekly - 1 sets - 10 reps - 2-3 hold - Seated Cervical Rotation AROM  - 1 x daily - 7 x weekly - 1 sets - 10 reps - 2-3 hold - Seated  Cervical Extension AROM  - 1 x daily - 7 x weekly - 1 sets - 10 reps - 2-3 hold - Seated Cervical Retraction  - 1 x daily - 7 x weekly - 1 sets - 10 reps - 2-3 hold - Shoulder Rolls in Sitting  - 1 x daily - 7 x weekly - 1 sets - 10 reps - 2-3 hold - Seated Sidebending Arms Overhead  - 1 x daily - 7 x weekly - 1 sets - 10 reps - 2-3 hold - Seated Hip Abduction  - 1 x daily - 7 x weekly - 1 sets - 10 reps - 2-3 hold - Seated Long Arc Quad  - 1 x daily - 7 x weekly - 1 sets - 10 reps - 2-3 hold - Seated Heel Toe Raises  - 1 x daily - 7 x weekly - 1 sets - 10 reps - 2-3 hold - Seated Toe Curl  - 1 x daily - 7 x weekly - 1 sets - 10 reps - 2-3 hold  ASSESSMENT:  CLINICAL IMPRESSION:  Pt re measured today with slight reduction throughout.  Reports she is frustrated with the pocket of edema above her knee and feels this is limiting her ROM.  Pt verbalizes wanting to try the bandaging.  Informed would discuss with evaluating therapist and determine. Explained what she would need and given a list as well as wear times vs JAS brace and increasing her visits to 3X week.  Pt was okay with all of this.  Also given another measurement sheet for knee highs/thigh highs to order as she had lost the one from last visit . Pt overall compliant with program.  Manual decongestive techniques complete for routing Lt inguinal to Lt axillary and Intra inguinal anastomosis followed by Lt LE.  Pt able to donn her capris with no assistance.  OBJECTIVE IMPAIRMENTS: Abnormal gait, decreased activity tolerance, decreased ROM, decreased strength, increased edema, and pain.   ACTIVITY LIMITATIONS: dressing, hygiene/grooming, and locomotion level  PARTICIPATION  LIMITATIONS: shopping and community activity  PERSONAL FACTORS: Time since onset of injury/illness/exacerbation and 1-2 comorbidities: recent TKR are also affecting patient's functional outcome.   REHAB POTENTIAL: Good  CLINICAL DECISION MAKING:  Stable/uncomplicated  EVALUATION COMPLEXITY: Moderate  GOALS: Goals reviewed with patient? Yes  SHORT TERM GOALS: Target date: 03/25/24  Pt to have lost 2 cm from Lt LE at knee and 10 cm proximal to knee area to make wearing pants easier for pt and allow pt to bend her knee easier.  Baseline: Goal status: ongoing   2.  Pt be completing HEP to increase lymphatic circulation to aid in decreasing edema in Lt knee Baseline:  Goal status: met  3.  Pt to be wearing compression capri in order to improve lymphatic circulation to aid in decreasing edema in Lt knee  Baseline:  Goal status: met   LONG TERM GOALS: Target date: 04/11/23  Pt to have lost 3-4 cm from Lt LE at knee and 10 cm proximal to knee area to make wearing pants easier for pt and allow pt to bend her knee easier.  Baseline:  Goal status: ongoing   2.  Pt to be I in self manual techniques to improve lymphatic circulation to decrease edema in Lt knee area.  Baseline:  Goal status:  ongoing  PLAN:  PT FREQUENCY: 2x/week  PT DURATION: 4 weeks  PLANNED INTERVENTIONS: 97110-Therapeutic exercises, 97535- Self Care, and 02859- Manual therapy  PLAN FOR NEXT SESSION: measure on Mondays.  Continue manual for Lt LE. Possibly begin bandaging next visit.    Greig KATHEE Fuse, PTA/CLT [facility] Ph: [phone] 03/28/2024, 2:49 PM

## 2024-03-30 ENCOUNTER — Encounter (HOSPITAL_COMMUNITY): Admitting: Physical Therapy

## 2024-04-01 ENCOUNTER — Ambulatory Visit (HOSPITAL_COMMUNITY): Admitting: Physical Therapy

## 2024-04-01 DIAGNOSIS — J3089 Other allergic rhinitis: Secondary | ICD-10-CM | POA: Diagnosis not present

## 2024-04-01 DIAGNOSIS — R600 Localized edema: Secondary | ICD-10-CM | POA: Diagnosis not present

## 2024-04-01 DIAGNOSIS — J3081 Allergic rhinitis due to animal (cat) (dog) hair and dander: Secondary | ICD-10-CM | POA: Diagnosis not present

## 2024-04-01 DIAGNOSIS — J301 Allergic rhinitis due to pollen: Secondary | ICD-10-CM | POA: Diagnosis not present

## 2024-04-01 NOTE — Therapy (Signed)
 OUTPATIENT PHYSICAL THERAPY LYMPHEDEMA Treatment  Patient Name: Kristina Mclaughlin MRN: [id number] DOB:30-Jul-1951, 73 y.o., female Today's Date: 04/01/2024  END OF SESSION:  PT End of Session - 04/01/24 1635     Visit Number 7    Number of Visits 8    Date for PT Re-Evaluation 04/10/24    Authorization Type [employer]    Progress Note Due on Visit 8    PT Start Time 1346    PT Stop Time 1429    PT Time Calculation (min) 43 min    Activity Tolerance Patient tolerated treatment well    Behavior During Therapy WFL for tasks assessed/performed           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, lateral back   Depression    Essential hypertension 10/29/2015   GERD (gastroesophageal reflux disease)    Hypertension    Insomnia    Migraine headache    Tear of medial meniscus of knee 09/06/2018   Past Surgical History:  Procedure Laterality Date   ABDOMINAL HYSTERECTOMY     BACK SURGERY     BREAST SURGERY     biopsy x2; reduction   EXAMINATION UNDER ANESTHESIA  08/27/2012   LIPOMA EXCISION Right 07/03/2021   Procedure: EXCISION LIPOMA; ANKLE;  Surgeon: Kallie Manuelita BROCKS, MD;  Location: [facility];  Service: General;  Laterality: Right;   LIPOMA EXCISION N/A 07/03/2021   Procedure: EXCISION LIPOMA; SUPRAPUBIC;  Surgeon: Kallie Manuelita BROCKS, MD;  Location: [facility];  Service: General;  Laterality: N/A;   MASS EXCISION Left 07/03/2021   Procedure: EXCISION LIPOMA; ANKLE;  Surgeon: Kallie Manuelita BROCKS, MD;  Location: [facility];  Service: General;  Laterality: Left;   MASS EXCISION Left 07/03/2021   Procedure: EXCISION OF LIPOMA; ARM;  Surgeon: Kallie Manuelita BROCKS, MD;  Location: [facility];  Service: General;  Laterality: Left;   NECK SURGERY     Mass removed   REDUCTION MAMMAPLASTY Bilateral 2002   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   XI ROBOTIC ASSISTED HIATAL HERNIA REPAIR N/A 11/18/2022    Procedure: ROBOTIC HIATAL HERNIA REPAIR;  Surgeon: Lyndel Deward PARAS, MD;  Location: [facility];  Service: General;  Laterality: N/A;   Patient Active Problem List   Diagnosis Date Noted   Non-recurrent acute serous otitis media of left ear 05/11/2023   Hiatal hernia 11/18/2022   Gastroesophageal reflux disease 02/10/2022   Chronic superficial gastritis without bleeding 02/10/2022   Fatty liver 02/10/2022   Allergic rhinitis due to animal (cat) (dog) hair and dander 01/20/2022   Cough variant asthma 01/20/2022   Chronic constipation 09/18/2021   Neuropathic pain 08/22/2021   Lipoma of foot 06/11/2021   Lipoma of upper arm 06/11/2021   Sensorineural hearing loss (SNHL) of left ear with unrestricted hearing of right ear 12/11/2020   Lipoma of abdominal wall 12/15/2019   Breast lipoma 12/15/2019   Lipoma of both lower extremities 12/15/2019   Primary insomnia 05/11/2018   Unilateral primary osteoarthritis, left knee 05/11/2018   Hemorrhoids 01/22/2018   Essential hypertension 10/29/2015   Upper airway cough syndrome 10/20/2014    PCP: Jolinda Norene HERO, DO  REFERRING PROVIDER: Jolinda Norene HERO, DO  REFERRING DIAG: [phone] (ICD-10-CM) - Left leg swelling  THERAPY DIAG:  M79.89 (ICD-10-CM) - Left leg swelling  Rationale for Evaluation and Treatment: Rehabilitation  ONSET DATE: chronic  SUBJECTIVE:  SUBJECTIVE STATEMENT:  Pt states that she is has been exercising, elevating since June.  She began completing her self manual and wearing her capri compression pants ever since she began therapy early August, yet she still has edema.    She is almost done with her JAS brace.   PERTINENT HISTORY: Pt has hx of OA in Lt knee has had a past arthroscopic surgery followed by Lt TKR on 01/12/24.  Has hx of  lipoma's and cervical surgeries.  PAIN:  Are you having pain? Yes NPRS scale: 3/10 Pain location: knee Pain orientation: Left  PAIN TYPE: throbbing and tight Pain description: intermittent  Aggravating factors: staying up to long  Relieving factors: elevate and ice  note TKR in June   PRECAUTIONS: None  RED FLAGS: None   WEIGHT BEARING RESTRICTIONS: No  FALLS:  Has patient fallen in last 6 months? 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  COGNITION: Overall cognitive status: Within functional limits for tasks assessed   PALPATION: No induration noted but positive edema   OBSERVATIONS / OTHER ASSESSMENTS: negative stemmer sign;   LYMPHEDEMA ASSESSMENTS:    LE LANDMARK RIGHT eval  At groin   30 cm proximal to suprapatella   20 cm proximal to suprapatella 64  10 cm proximal to suprapatella 57.5  At midpatella / popliteal crease 44.3  30 cm proximal to floor at lateral plantar foot 40.5  20 cm proximal to floor at lateral plantar foot 25.9  10 cm proximal to floor at lateral plantar foot 22.4  Circumference of ankle/heel 31.2  5 cm proximal to 1st MTP joint 21.6  Across MTP joint 22.3  Around proximal great toe 7.5  (Blank rows = not tested)  LE LANDMARK LEFT eval 03/21/24 8/25  At groin     30 cm proximal to suprapatella     20 cm proximal to suprapatella 65.2 64.2 64  10 cm proximal to suprapatella 61.8 60.8 60  At midpatella / popliteal crease 52.3 51.3 51  30 cm proximal to floor at lateral plantar foot 40.9 40.8 40  20 cm proximal to floor at lateral plantar foot 28.2 27.3 29  10  cm proximal to floor at lateral plantar foot 23.9 23.7 23.5  Circumference of  ankle/heel 31.3  31  5  cm proximal to 1st MTP joint 20.6  22  Across MTP joint 22.2  21  Around proximal great toe 7.7  7  (Blank rows = not tested)    TODAY'S TREATMENT:                                                                                                                              DATE:  04/01/24: Manual decongestive techniques to include supraclavicular, deep and superficial abdominal, routing Lt inguinal to Lt axillary and Intra inguinal anastomosis followed by Lt LE.  Posterior completed in prone.    03/25/24: Manual decongestive techniques to include supraclavicular, deep and superficial abdominal, routing Lt inguinal to Lt axillary and Intra inguinal anastomosis followed by Lt LE.  Posterior completed in prone.   Educated benefit of knee high for ankle edema and varicose veins.  Measurements taken and given ETI information.  03/21/24 PT received decongestive techniques to include supraclavicular, deep and superficial abdominal, routing Lt inguinal to Lt axillary and Intra inguinal anastomosis followed by Lt LE.  Posterior completed in prone.     PATIENT EDUCATION:  03/18/24:  self manual techniques, demonstrated, pt completed and therapist gave pt handout on self manual  Education details: HEP/ where to obtain capri compression,  next treatment we will begin manual lymphatic techniques.  Person educated: Patient Education method: Explanation, Demonstration, and Handouts Education comprehension: verbalized understanding and returned demonstration  HOME EXERCISE PROGRAM: Access Code: [email or web address] URL: [email or web address] Date: 03/11/2024 Prepared by: Montie Metro  Exercises - Seated Diaphragmatic Breathing  - 1 x daily - 7 x weekly - 10 reps - 5 hold - Seated Cervical Sidebending AROM  - 1 x daily - 7 x weekly - 1 sets - 10 reps - 2-3 hold - Seated Cervical Rotation AROM  - 1 x daily - 7 x weekly - 1 sets - 10 reps - 2-3 hold - Seated Cervical  Extension AROM  - 1 x daily - 7 x weekly - 1 sets - 10 reps - 2-3 hold - Seated Cervical Retraction  - 1 x daily - 7 x weekly - 1 sets - 10 reps - 2-3 hold - Shoulder Rolls in Sitting  - 1 x daily - 7 x weekly - 1 sets - 10 reps - 2-3 hold - Seated Sidebending Arms Overhead  - 1 x daily - 7 x weekly - 1 sets - 10 reps - 2-3 hold - Seated Hip Abduction  - 1 x daily - 7 x weekly - 1 sets - 10 reps - 2-3 hold - Seated Long Arc Quad  - 1 x daily - 7 x weekly - 1 sets - 10 reps - 2-3 hold - Seated Heel Toe Raises  - 1 x daily - 7 x weekly - 1 sets - 10 reps - 2-3 hold - Seated Toe Curl  - 1 x daily - 7 x weekly - 1 sets - 10 reps - 2-3 hold  ASSESSMENT:  CLINICAL IMPRESSION:  PT has slightly decreased induration in knee area.  She has been completing her exercises, wearing capri compression and elevating for over 6 weeks  but she continues to have significant edema in her knee area, possible lymphedema. .  A compression  pump will be beneficial to reduce the pt edema.  Pt will continue to benefit from manual decongestive techniques     OBJECTIVE IMPAIRMENTS: Abnormal gait, decreased activity tolerance, decreased ROM, decreased strength, increased edema, and pain.   ACTIVITY LIMITATIONS: dressing, hygiene/grooming, and locomotion level  PARTICIPATION LIMITATIONS: shopping and community activity  PERSONAL FACTORS: Time since onset of injury/illness/exacerbation and 1-2 comorbidities: recent TKR are also affecting patient's functional outcome.   REHAB POTENTIAL: Good  CLINICAL DECISION MAKING: Stable/uncomplicated  EVALUATION COMPLEXITY: Moderate  GOALS: Goals reviewed with patient? Yes  SHORT TERM GOALS: Target date: 03/25/24  Pt to have lost 2 cm from Lt LE at knee and  10 cm proximal to knee area to make wearing pants easier for pt and allow pt to bend her knee easier.  Baseline: Goal status: ongoing   2.  Pt be completing HEP to increase lymphatic circulation to aid in  decreasing edema in Lt knee Baseline:  Goal status: met  3.  Pt to be wearing compression capri in order to improve lymphatic circulation to aid in decreasing edema in Lt knee  Baseline:  Goal status: met   LONG TERM GOALS: Target date: 04/11/23  Pt to have lost 3-4 cm from Lt LE at knee and 10 cm proximal to knee area to make wearing pants easier for pt and allow pt to bend her knee easier.  Baseline:  Goal status: ongoing   2.  Pt to be I in self manual techniques to improve lymphatic circulation to decrease edema in Lt knee area.  Baseline:  Goal status:  ongoing  PLAN:  PT FREQUENCY: 2x/week  PT DURATION: 4 weeks  PLANNED INTERVENTIONS: 97110-Therapeutic exercises, 97535- Self Care, and 02859- Manual therapy  PLAN FOR NEXT SESSION: measure next visit.  Speak with pt about continued therapy vs self care.  Montie Metro, PT CLT [phone]  [facility] Ph: [phone] 04/01/2024, 4:36 PM

## 2024-04-05 ENCOUNTER — Telehealth: Payer: Self-pay | Admitting: Family Medicine

## 2024-04-05 ENCOUNTER — Ambulatory Visit (HOSPITAL_COMMUNITY): Attending: Family Medicine | Admitting: Physical Therapy

## 2024-04-05 DIAGNOSIS — R600 Localized edema: Secondary | ICD-10-CM | POA: Insufficient documentation

## 2024-04-05 NOTE — Therapy (Signed)
 OUTPATIENT PHYSICAL THERAPY LYMPHEDEMA Treatment/Discharge   Patient Name: Kristina Mclaughlin MRN: [id number] DOB:[birth year], 73 y.o., female Today's Date: 04/05/2024 PHYSICAL THERAPY DISCHARGE SUMMARY  Visits from Start of Care: 8  Current functional level related to goals / functional outcomes: PT has reduced in measurements.  PT has information to obtain thigh high compression, she is I in self manual and exercises.  Therapist has ordered a compression pump.    Remaining deficits: Pt continues to have edema 10 superior to knee to 5 distal to knee area    Education / Equipment: Self manual, exercises, where to obtain compression garment.    Patient agrees to discharge. Patient goals were partially met. Patient is being discharged due to maximized rehab potential.   END OF SESSION:  PT End of Session - 04/05/24 1432     Visit Number 8    Number of Visits 8    Date for PT Re-Evaluation 04/10/24    Authorization Type [employer]et[employer]    Progress Note Due on Visit 8    PT Start Time 1338    PT Stop Time 1428    PT Time Calculation (min) 50 min    Activity Tolerance Patient tolerated treatment well    Behavior During Therapy WFL for tasks assessed/performed            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, lateral back   Depression    Essential hypertension 10/29/2015   GERD (gastroesophageal reflux disease)    Hypertension    Insomnia    Migraine headache    Tear of medial meniscus of knee 09/06/2018   Past Surgical History:  Procedure Laterality Date   ABDOMINAL HYSTERECTOMY     BACK SURGERY     BREAST SURGERY     biopsy x2; reduction   EXAMINATION UNDER ANESTHESIA  08/27/2012   LIPOMA EXCISION Right 07/03/2021   Procedure: EXCISION LIPOMA; ANKLE;  Surgeon: Kallie Manuelita BROCKS, MD;  Location: [facility];  Service: General;  Laterality: Right;   LIPOMA EXCISION N/A 07/03/2021    Procedure: EXCISION LIPOMA; SUPRAPUBIC;  Surgeon: Kallie Manuelita BROCKS, MD;  Location: [facility];  Service: General;  Laterality: N/A;   MASS EXCISION Left 07/03/2021   Procedure: EXCISION LIPOMA; ANKLE;  Surgeon: Kallie Manuelita BROCKS, MD;  Location: [facility];  Service: General;  Laterality: Left;   MASS EXCISION Left 07/03/2021   Procedure: EXCISION OF LIPOMA; ARM;  Surgeon: Kallie Manuelita BROCKS, MD;  Location: [facility];  Service: General;  Laterality: Left;   NECK SURGERY     Mass removed   REDUCTION MAMMAPLASTY Bilateral 2002   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   XI ROBOTIC ASSISTED HIATAL HERNIA REPAIR N/A 11/18/2022   Procedure: ROBOTIC HIATAL HERNIA REPAIR;  Surgeon: Lyndel Deward PARAS, MD;  Location: [facility];  Service: General;  Laterality: N/A;   Patient Active Problem List   Diagnosis Date Noted   Non-recurrent acute serous otitis media of left ear 05/11/2023   Hiatal hernia 11/18/2022   Gastroesophageal reflux disease 02/10/2022   Chronic superficial gastritis without bleeding 02/10/2022   Fatty liver 02/10/2022   Allergic rhinitis due to animal (cat) (dog) hair and dander 01/20/2022   Cough variant asthma 01/20/2022   Chronic constipation 09/18/2021   Neuropathic pain 08/22/2021   Lipoma of foot 06/11/2021   Lipoma of upper arm 06/11/2021   Sensorineural hearing loss (SNHL) of  left ear with unrestricted hearing of right ear 12/11/2020   Lipoma of abdominal wall 12/15/2019   Breast lipoma 12/15/2019   Lipoma of both lower extremities 12/15/2019   Primary insomnia 05/11/2018   Unilateral primary osteoarthritis, left knee 05/11/2018   Hemorrhoids 01/22/2018   Essential hypertension 10/29/2015   Upper airway cough syndrome 10/20/2014    PCP: Jolinda Norene HERO, DO  REFERRING PROVIDER: Jolinda Norene HERO, DO  REFERRING DIAG: [phone] (ICD-10-CM) - Left leg swelling  THERAPY DIAG:  M79.89 (ICD-10-CM) - Left leg swelling  Rationale for Evaluation and Treatment:  Rehabilitation  ONSET DATE: chronic  SUBJECTIVE:                                                                                                                                                                                           SUBJECTIVE STATEMENT:  Pt states that she is has been exercising, elevating since June.  She began completing her self manual and wearing her capri compression pants ever since she began therapy early August, yet she still has edema.    She is almost done with her JAS brace.   PERTINENT HISTORY: Pt has hx of OA in Lt knee has had a past arthroscopic surgery followed by Lt TKR on 01/12/24.  Has hx of lipoma's and cervical surgeries.  PAIN:  Are you having pain? Yes NPRS scale: 3/10 Pain location: knee Pain orientation: Left  PAIN TYPE: throbbing and tight Pain description: intermittent  Aggravating factors: staying up to long  Relieving factors: elevate and ice  note TKR in June   PRECAUTIONS: None  RED FLAGS: None   WEIGHT BEARING RESTRICTIONS: No  FALLS:  Has patient fallen in last 6 months? 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  COGNITION: Overall cognitive status: Within functional limits for tasks assessed   PALPATION: No induration noted but positive edema   OBSERVATIONS / OTHER ASSESSMENTS: negative stemmer sign;   LYMPHEDEMA ASSESSMENTS:    LE LANDMARK RIGHT eval  At groin   30 cm proximal to suprapatella   20 cm proximal to suprapatella 64  10 cm proximal to suprapatella 57.5  At midpatella / popliteal crease 44.3  30 cm proximal to floor at lateral plantar foot 40.5  20 cm proximal to floor at lateral plantar foot 25.9  10  cm proximal to floor at lateral plantar  foot 22.4  Circumference of ankle/heel 31.2  5 cm proximal to 1st MTP joint 21.6  Across MTP joint 22.3  Around proximal great toe 7.5  (Blank rows = not tested)  LE LANDMARK LEFT eval 03/21/24 8/25 04/05/24 lost Rt vs lt  At groin        30 cm proximal to suprapatella        20 cm proximal to suprapatella 65.2 64.2 64 63.9 -1.3 -0.1  10 cm proximal to suprapatella 61.8 60.8 60 59.5 -2.3 +2.4  At midpatella / popliteal crease 52.3 51.3 51 51.2 -1.1 +6.9  30 cm proximal to floor at lateral plantar foot 40.9 40.8 40 39.5 -1.4 -0.5  20 cm proximal to floor at lateral plantar foot 28.2 27.3 29 28.7 -0.5 +2.8  10 cm proximal to floor at lateral plantar foot 23.9 23.7 23.5 24.2 +0.3 +1.8  Circumference of ankle/heel 31.3  31 31.7 +0.4  +0.5  5 cm proximal to 1st MTP joint 20.6  22 20.7 +0.1 -0.9  Across MTP joint 22.2  21 22.3 +0.1 0  Around proximal great toe 7.7  7 7.3 -0.4  -0.1  (Blank rows = not tested)    TODAY'S TREATMENT:                                                                                                                              DATE:  04/05/24: Manual decongestive techniques to include supraclavicular, deep and superficial abdominal, routing Lt inguinal to Lt axillary and Intra inguinal anastomosis followed by Lt LE.  Posterior completed in prone.   PT shown compression pump.  Therapist will order as pt is interested in using it to attempt to decrease her edema.   PATIENT EDUCATION:  9/2:  Therapist  gave pt ETI information again as she had lost it.  Therapist demonstrated how the compression pump work.  Pt instructed pt to attempt to walk in the pool while on vacation as the pressure will be good to decrease her edema.  03/18/24:  self manual techniques, demonstrated, pt completed and therapist gave pt handout on self manual  Education details: HEP/ where to obtain capri compression,  next treatment we will begin manual  lymphatic techniques.  Person educated: Patient Education method: Explanation, Demonstration, and Handouts Education comprehension: verbalized understanding and returned demonstration  HOME EXERCISE PROGRAM: Access Code: [email or web address] URL: [email or web address] Date: 03/11/2024 Prepared by: Montie Metro  Exercises - Seated Diaphragmatic Breathing  - 1 x daily - 7 x weekly - 10 reps - 5 hold - Seated Cervical Sidebending AROM  - 1 x daily - 7 x weekly - 1 sets - 10 reps - 2-3 hold - Seated Cervical Rotation AROM  - 1 x daily - 7 x weekly - 1 sets - 10 reps - 2-3 hold - Seated Cervical Extension AROM  - 1 x daily - 7 x weekly - 1 sets - 10 reps - 2-3 hold - Seated Cervical  Retraction  - 1 x daily - 7 x weekly - 1 sets - 10 reps - 2-3 hold - Shoulder Rolls in Sitting  - 1 x daily - 7 x weekly - 1 sets - 10 reps - 2-3 hold - Seated Sidebending Arms Overhead  - 1 x daily - 7 x weekly - 1 sets - 10 reps - 2-3 hold - Seated Hip Abduction  - 1 x daily - 7 x weekly - 1 sets - 10 reps - 2-3 hold - Seated Long Arc Quad  - 1 x daily - 7 x weekly - 1 sets - 10 reps - 2-3 hold - Seated Heel Toe Raises  - 1 x daily - 7 x weekly - 1 sets - 10 reps - 2-3 hold - Seated Toe Curl  - 1 x daily - 7 x weekly - 1 sets - 10 reps - 2-3 hold  ASSESSMENT:  CLINICAL IMPRESSION:  PT has slightly decreased induration in knee area.  She has been completing her exercises, wearing capri compression and elevating for over 6 weeks  but she continues to have significant edema in her knee area, possible lymphedema. .  A compression  pump will be beneficial to reduce the pt edema. PT is not interested in compression bandaging and is I in manual as well as exercises therefore pt will be discharged at this time.  OBJECTIVE IMPAIRMENTS: Abnormal gait, decreased activity tolerance, decreased ROM, decreased strength, increased edema, and pain.   ACTIVITY LIMITATIONS: dressing, hygiene/grooming, and  locomotion level  PARTICIPATION LIMITATIONS: shopping and community activity  PERSONAL FACTORS: Time since onset of injury/illness/exacerbation and 1-2 comorbidities: recent TKR are also affecting patient's functional outcome.   REHAB POTENTIAL: Good  CLINICAL DECISION MAKING: Stable/uncomplicated  EVALUATION COMPLEXITY: Moderate  GOALS: Goals reviewed with patient? Yes  SHORT TERM GOALS: Target date: 03/25/24  Pt to have lost 2 cm from Lt LE at knee and 10 cm proximal to knee area to make wearing pants easier for pt and allow pt to bend her knee easier.  Baseline: Goal status: ongoing   2.  Pt be completing HEP to increase lymphatic circulation to aid in decreasing edema in Lt knee Baseline:  Goal status: met  3.  Pt to be wearing compression capri in order to improve lymphatic circulation to aid in decreasing edema in Lt knee  Baseline:  Goal status: met   LONG TERM GOALS: Target date: 04/11/23  Pt to have lost 3-4 cm from Lt LE at knee and 10 cm proximal to knee area to make wearing pants easier for pt and allow pt to bend her knee easier.  Baseline:  Goal status: ongoing   2.  Pt to be I in self manual techniques to improve lymphatic circulation to decrease edema in Lt knee area.  Baseline:  Goal status: met   PLAN:  PT FREQUENCY: 2x/week  PT DURATION: 4 weeks  PLANNED INTERVENTIONS: 97110-Therapeutic exercises, 97535- Self Care, and 02859- Manual therapy  PLAN FOR NEXT SESSION: PT will return to treatment if she decides to go forward with compression bandaging.  Montie Metro, PT CLT [phone]  [facility] Rehabilitation [facility] Ph: [phone] 04/05/2024, 2:32 PM

## 2024-04-05 NOTE — Telephone Encounter (Unsigned)
 Copied from CRM [id number]. Topic: Appointments - Scheduling Inquiry for Clinic >> Apr 05, 2024  8:26 AM Zane F wrote: Patient would like to be scheduled for a physical appointment with PCP this year. Please contact her to assist.  Callback Number: [id number]

## 2024-04-06 NOTE — Telephone Encounter (Signed)
 Spoke with patient, appointment already scheduled earlier this morning.

## 2024-04-06 NOTE — Telephone Encounter (Signed)
 Can nurse work pt in?

## 2024-04-07 ENCOUNTER — Encounter (HOSPITAL_COMMUNITY): Admitting: Physical Therapy

## 2024-04-13 ENCOUNTER — Encounter: Payer: Medicare HMO | Admitting: Family Medicine

## 2024-04-13 ENCOUNTER — Other Ambulatory Visit: Payer: Self-pay | Admitting: Family Medicine

## 2024-04-13 DIAGNOSIS — E781 Pure hyperglyceridemia: Secondary | ICD-10-CM

## 2024-04-18 ENCOUNTER — Encounter (HOSPITAL_COMMUNITY): Payer: Self-pay | Admitting: Physical Therapy

## 2024-04-18 ENCOUNTER — Telehealth: Payer: Self-pay | Admitting: Family Medicine

## 2024-04-18 DIAGNOSIS — Z96652 Presence of left artificial knee joint: Secondary | ICD-10-CM | POA: Diagnosis not present

## 2024-04-18 NOTE — Telephone Encounter (Signed)
 Glad to but I'm not sure what we are talking about.  I haven't seen this pt since 11/2023

## 2024-04-18 NOTE — Therapy (Signed)
 Called Dr. Jolinda office re needing Dr. To sign off on compression pump.  The office had called Friday stating that they could not supply pt with the compression pump.  Therapist explained that [facility] would supply the pump; they just needed a doctor to sign the prescription for the pump.  Office verbalized understanding and stated that they would give the message to the physician.  Montie Metro, PT CLT [phone]

## 2024-04-18 NOTE — Telephone Encounter (Signed)
 Returned [employer]. Advised Dr. Jolinda would sign off for the compression pump.

## 2024-04-18 NOTE — Telephone Encounter (Signed)
 Copied from CRM [id number]. Topic: Clinical - Order For Equipment >> Apr 18, 2024  2:31 PM Montie POUR wrote: Reason for CRM:  Dorthea Metro is calling from [facility]. She wants to see if Dr. Ella will sign off on the order for [employer]'s compression pump and fax it to [facility] at [phone]. When [employer] receives the order, they will mail it to [employer].   Please call Dorthea Metro at [phone] with any questions.  Thanks

## 2024-04-21 DIAGNOSIS — L501 Idiopathic urticaria: Secondary | ICD-10-CM | POA: Diagnosis not present

## 2024-04-21 DIAGNOSIS — J301 Allergic rhinitis due to pollen: Secondary | ICD-10-CM | POA: Diagnosis not present

## 2024-04-21 DIAGNOSIS — R21 Rash and other nonspecific skin eruption: Secondary | ICD-10-CM | POA: Diagnosis not present

## 2024-04-21 DIAGNOSIS — J45991 Cough variant asthma: Secondary | ICD-10-CM | POA: Diagnosis not present

## 2024-04-28 DIAGNOSIS — I890 Lymphedema, not elsewhere classified: Secondary | ICD-10-CM | POA: Diagnosis not present

## 2024-05-03 ENCOUNTER — Ambulatory Visit (INDEPENDENT_AMBULATORY_CARE_PROVIDER_SITE_OTHER)

## 2024-05-03 ENCOUNTER — Ambulatory Visit (INDEPENDENT_AMBULATORY_CARE_PROVIDER_SITE_OTHER): Admitting: Family Medicine

## 2024-05-03 ENCOUNTER — Encounter: Payer: Self-pay | Admitting: Family Medicine

## 2024-05-03 ENCOUNTER — Ambulatory Visit: Payer: Self-pay | Admitting: Family Medicine

## 2024-05-03 VITALS — BP 128/65 | HR 83 | Temp 98.3°F | Ht 69.0 in | Wt 222.5 lb

## 2024-05-03 DIAGNOSIS — Z0001 Encounter for general adult medical examination with abnormal findings: Secondary | ICD-10-CM

## 2024-05-03 DIAGNOSIS — R058 Other specified cough: Secondary | ICD-10-CM | POA: Diagnosis not present

## 2024-05-03 DIAGNOSIS — E781 Pure hyperglyceridemia: Secondary | ICD-10-CM

## 2024-05-03 DIAGNOSIS — R7303 Prediabetes: Secondary | ICD-10-CM | POA: Diagnosis not present

## 2024-05-03 DIAGNOSIS — R6889 Other general symptoms and signs: Secondary | ICD-10-CM

## 2024-05-03 DIAGNOSIS — E66811 Obesity, class 1: Secondary | ICD-10-CM

## 2024-05-03 DIAGNOSIS — J45991 Cough variant asthma: Secondary | ICD-10-CM

## 2024-05-03 DIAGNOSIS — Z0000 Encounter for general adult medical examination without abnormal findings: Secondary | ICD-10-CM

## 2024-05-03 DIAGNOSIS — K760 Fatty (change of) liver, not elsewhere classified: Secondary | ICD-10-CM

## 2024-05-03 DIAGNOSIS — I10 Essential (primary) hypertension: Secondary | ICD-10-CM | POA: Diagnosis not present

## 2024-05-03 DIAGNOSIS — N3281 Overactive bladder: Secondary | ICD-10-CM | POA: Diagnosis not present

## 2024-05-03 DIAGNOSIS — F5101 Primary insomnia: Secondary | ICD-10-CM | POA: Diagnosis not present

## 2024-05-03 DIAGNOSIS — I771 Stricture of artery: Secondary | ICD-10-CM | POA: Diagnosis not present

## 2024-05-03 LAB — VERITOR FLU A/B WAIVED
Influenza A: NEGATIVE
Influenza B: NEGATIVE

## 2024-05-03 LAB — BAYER DCA HB A1C WAIVED: HB A1C (BAYER DCA - WAIVED): 6.3 % — ABNORMAL HIGH (ref 4.8–5.6)

## 2024-05-03 LAB — LIPID PANEL

## 2024-05-03 MED ORDER — ATORVASTATIN CALCIUM 40 MG PO TABS: 40.0000 mg | ORAL_TABLET | Freq: Every day | ORAL | 3 refills | Status: DC

## 2024-05-03 MED ORDER — ROSUVASTATIN CALCIUM 20 MG PO TABS
20.0000 mg | ORAL_TABLET | Freq: Every day | ORAL | 3 refills | Status: AC
Start: 2024-05-03 — End: ?

## 2024-05-03 MED ORDER — VALSARTAN-HYDROCHLOROTHIAZIDE 320-25 MG PO TABS: 1.0000 | ORAL_TABLET | Freq: Every day | ORAL | 3 refills | Status: AC

## 2024-05-03 MED ORDER — ALBUTEROL SULFATE HFA 108 (90 BASE) MCG/ACT IN AERS: INHALATION_SPRAY | RESPIRATORY_TRACT | 1 refills | Status: AC

## 2024-05-03 MED ORDER — TRAZODONE HCL 150 MG PO TABS: 150.0000 mg | ORAL_TABLET | Freq: Every evening | ORAL | 3 refills | Status: AC | PRN

## 2024-05-03 MED ORDER — PREDNISONE 20 MG PO TABS: ORAL_TABLET | ORAL | 0 refills | Status: DC

## 2024-05-03 MED ORDER — GUAIFENESIN-CODEINE 100-10 MG/5ML PO SOLN: 5.0000 mL | Freq: Four times a day (QID) | ORAL | 0 refills | Status: DC | PRN

## 2024-05-03 MED ORDER — TROSPIUM CHLORIDE 20 MG PO TABS: 20.0000 mg | ORAL_TABLET | Freq: Two times a day (BID) | ORAL | 3 refills | Status: AC

## 2024-05-03 MED ORDER — BENZONATATE 100 MG PO CAPS: 100.0000 mg | ORAL_CAPSULE | Freq: Three times a day (TID) | ORAL | 0 refills | Status: AC | PRN

## 2024-05-03 NOTE — Patient Instructions (Signed)
 It appears that you have a viral upper respiratory infection. Symptoms can last up to 2 weeks.  I recommend that you only use cold medications that are safe in high blood pressure like Coricidin (generic is fine).  Other cold medications can increase your blood pressure.    - Get plenty of rest and drink plenty of fluids. - Try to breathe moist air. Use a cold mist humidifier. - Consume warm fluids (soup or tea) to provide relief for a stuffy nose and to loosen phlegm. - For nasal stuffiness, try saline nasal spray or a Neti Pot.  Afrin nasal spray can also be used but this product should not be used longer than 3 days or it will cause rebound nasal stuffiness (worsening nasal congestion). - For sore throat pain relief: use chloraseptic spray, suck on throat lozenges, hard candy or popsicles; gargle with warm salt water (1/4 tsp. salt per 8 oz. of water); and eat soft, bland foods. - Eat a well-balanced diet. If you cannot, ensure you are getting enough nutrients by taking a daily multivitamin. - Avoid dairy products, as they can thicken phlegm. - Avoid alcohol, as it impairs your body's immune system.  CONTACT YOUR DOCTOR IF YOU EXPERIENCE ANY OF THE FOLLOWING: - High fever - Ear pain - Sinus-type headache - Unusually severe cold symptoms - Cough that gets worse while other cold symptoms improve - Flare up of any chronic lung problem, such as asthma - Your symptoms persist longer than 2 weeks

## 2024-05-03 NOTE — Progress Notes (Signed)
 Kristina Mclaughlin is a 73 y.o. female presents to office today for annual physical exam examination.     Patient reports that she had been doing fairly well until about 5 days ago when she started feeling ill. She reports sore throat, then developed a productive cough that is now producing green sputum.  Denies worsening shortness of breath/ wheezing.  Has known cough variant asthma.  Using her inhalers as directed with last albuterol  just prior to visit.  No measured fevers but having subjective warmth/ sweating at night time.  She reports associated nonbloody diarrhea. No nausea or vomiting. Multiple sick contacts at school. No known flu/ COVID.  Using mucinex / Dayquil/ Nyquil.   Additionally reports that she received a letter that she needed to dc her Lipitor and ask her provider to change to alternative.  She thinks it was a recall.   Occupation: works at a school, Substance use: none Health Maintenance Due  Topic Date Due   Influenza Vaccine  03/04/2024    Immunization History  Administered Date(s) Administered   Fluad Quad(high Dose 65+) 05/23/2019, 05/30/2020, 05/27/2021, 06/02/2022   Fluad Trivalent(High Dose 65+) 05/22/2023   INFLUENZA, HIGH DOSE SEASONAL PF 08/07/2016, 08/06/2017, 08/12/2018, 10/20/2019, 10/17/2021   Influenza,inj,quad, With Preservative 05/06/2017, 05/08/2018   Influenza-Unspecified 05/23/2015, 05/08/2018   PFIZER(Purple Top)SARS-COV-2 Vaccination 10/24/2019, 11/14/2019   Pneumococcal Conjugate-13 05/23/2019   Pneumococcal Polysaccharide-23 08/07/2016, 08/06/2017, 06/09/2018, 08/12/2018, 10/20/2019, 10/17/2021   Pneumococcal-Unspecified 05/23/2015   Tdap 11/18/2023   Zoster Recombinant(Shingrix) 09/05/2019, 11/28/2019   Zoster, Live 06/20/2015   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, lateral back   Depression    Essential hypertension 10/29/2015   GERD  (gastroesophageal reflux disease)    Hypertension    Insomnia    Migraine headache    Tear of medial meniscus of knee 09/06/2018   Social History   Socioeconomic History   Marital status: Married    Spouse name: Not on file   Number of children: 2   Years of education: Not on file   Highest education level: Associate degree: occupational, Scientist, product/process development, or vocational program  Occupational History   Occupation: Technical brewer   Tobacco Use   Smoking status: Former    Current packs/day: 0.00    Average packs/day: 0.3 packs/day for 2.0 years (0.5 ttl pk-yrs)    Types: Cigarettes    Start date: 1978    Quit date: 1980    Years since quitting: 45.7   Smokeless tobacco: Never  Vaping Use   Vaping status: Never Used  Substance and Sexual Activity   Alcohol use: No    Alcohol/week: 0.0 standard drinks of alcohol   Drug use: No   Sexual activity: Not on file  Other Topics Concern   Not on file  Social History Narrative   She is a retired Therapist, occupational.  She is married with 2 daughters and 4 grandchildren, all who live locally.  She tries to stay active.   Social Drivers of Corporate investment banker Strain: Low Risk  (05/02/2024)   Overall Financial Resource Strain (CARDIA)    Difficulty of Paying Living Expenses: Not hard at all  Food Insecurity: No Food Insecurity (05/02/2024)   Hunger Vital Sign    Worried About Running Out of Food in the Last Year: Never true    Ran Out of Food in the Last Year: Never true  Transportation Needs: No Transportation Needs (05/02/2024)   PRAPARE - Administrator, Civil Service (Medical): No    Lack of Transportation (Non-Medical): No  Physical Activity: Inactive (05/02/2024)   Exercise Vital Sign    Days of Exercise per Week: 0 days    Minutes of Exercise per Session: Not on file  Stress: No Stress Concern Present (05/02/2024)   [employer] of Occupational Health - Occupational Stress Questionnaire    Feeling of  Stress: Only a little  Social Connections: Socially Integrated (05/02/2024)   Social Connection and Isolation Panel    Frequency of Communication with Friends and Family: Twice a week    Frequency of Social Gatherings with Friends and Family: Twice a week    Attends Religious Services: More than 4 times per year    Active Member of Clubs or Organizations: Yes    Attends Banker Meetings: 1 to 4 times per year    Marital Status: Married  Catering manager Violence: Not At Risk (01/26/2024)   Humiliation, Afraid, Rape, and Kick questionnaire    Fear of Current or Ex-Partner: No    Emotionally Abused: No    Physically Abused: No    Sexually Abused: No   Past Surgical History:  Procedure Laterality Date   ABDOMINAL HYSTERECTOMY     BACK SURGERY     BREAST SURGERY     biopsy x2; reduction   EXAMINATION UNDER ANESTHESIA  08/27/2012   LIPOMA EXCISION Right 07/03/2021   Procedure: EXCISION LIPOMA; ANKLE;  Surgeon: Kallie Manuelita BROCKS, MD;  Location: [facility];  Service: General;  Laterality: Right;   LIPOMA EXCISION N/A 07/03/2021   Procedure: EXCISION LIPOMA; SUPRAPUBIC;  Surgeon: Kallie Manuelita BROCKS, MD;  Location: [facility];  Service: General;  Laterality: N/A;   MASS EXCISION Left 07/03/2021   Procedure: EXCISION LIPOMA; ANKLE;  Surgeon: Kallie Manuelita BROCKS, MD;  Location: [facility];  Service: General;  Laterality: Left;   MASS EXCISION Left 07/03/2021   Procedure: EXCISION OF LIPOMA; ARM;  Surgeon: Kallie Manuelita BROCKS, MD;  Location: [facility];  Service: General;  Laterality: Left;   NECK SURGERY     Mass removed   REDUCTION MAMMAPLASTY Bilateral 2002   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   XI ROBOTIC ASSISTED HIATAL HERNIA REPAIR N/A 11/18/2022   Procedure: ROBOTIC HIATAL HERNIA REPAIR;  Surgeon: Lyndel Deward PARAS, MD;  Location: [facility];  Service: General;  Laterality: N/A;   Family History  Problem Relation Age of Onset   Bladder Cancer Mother    Allergies Mother    Heart  disease Mother    Allergies Sister    Alzheimer's disease Father    Breast cancer Neg Hx     Current Outpatient Medications:    acetaminophen  (TYLENOL ) 500 MG tablet, Take 1,000 mg by mouth every 6 (six) hours as needed for moderate pain., Disp: , Rfl:    benzonatate  (TESSALON  PERLES) 100 MG capsule, Take 1 capsule (100 mg total) by mouth 3 (three) times daily as needed., Disp: 20 capsule, Rfl: 0   Biotin 10 MG CAPS, Take 10 mg by mouth daily., Disp: , Rfl:    budesonide -formoterol  (SYMBICORT ) 80-4.5 MCG/ACT inhaler, Take 2 puffs first thing in am and then another 2 puffs about 12 hours later., Disp: 1 each, Rfl: 12   Calcium  Carbonate (CALCIUM  500 PO), Take 500 mg by mouth daily., Disp: , Rfl:    Cholecalciferol (VITAMIN D3) 125 MCG (5000 UT) capsule, Take 5,000 Units by mouth  daily., Disp: , Rfl:    diclofenac  Sodium (VOLTAREN ) 1 % GEL, Apply 2 g topically 4 (four) times daily as needed (arthritis)., Disp: 400 g, Rfl: PRN   EPINEPHrine  0.3 mg/0.3 mL IJ SOAJ injection, Inject 0.3 mg into the muscle as needed for anaphylaxis., Disp: , Rfl:    guaiFENesin -codeine 100-10 MG/5ML syrup, Take 5 mLs by mouth every 6 (six) hours as needed for cough., Disp: 120 mL, Rfl: 0   Multiple Vitamin (MULTI-VITAMIN PO), Take 1 tablet by mouth daily. , Disp: , Rfl:    Polyethylene Glycol 400 (BLINK TEARS OP), Place 1 drop into both eyes daily., Disp: , Rfl:    predniSONE  (DELTASONE ) 20 MG tablet, 2 po at same time daily for 5 days, Disp: 10 tablet, Rfl: 0   Probiotic Product (PROBIOTIC PO), Take 1 capsule by mouth daily., Disp: , Rfl:    rosuvastatin (CRESTOR) 20 MG tablet, Take 1 tablet (20 mg total) by mouth daily. To replace Atorvastatin  (pt states it was recalled?), Disp: 100 tablet, Rfl: 3   SUMAtriptan  (IMITREX ) 100 MG tablet, TAKE 1 TABLET AS NEED FOR MIGRAINE. MAY REPEAT IN 2 HOURS IF HEADACHE PERSISTS OR RECURS., Disp: 8 tablet, Rfl: prn   traMADol (ULTRAM) 50 MG tablet, Take 50 mg by mouth every 6 (six)  hours as needed., Disp: , Rfl:    albuterol  (VENTOLIN  HFA) 108 (90 Base) MCG/ACT inhaler, TAKE 2 PUFFS BY MOUTH EVERY 6 HOURS AS NEEDED FOR WHEEZE OR SHORTNESS OF BREATH, Disp: 8.5 each, Rfl: 1   fexofenadine  (ALLEGRA ) 180 MG tablet, Take 1 tablet (180 mg total) by mouth daily. (Patient not taking: Reported on 05/03/2024), Disp: 90 tablet, Rfl: 3   fluticasone  (FLONASE ) 50 MCG/ACT nasal spray, SPRAY 2 SPRAYS INTO EACH NOSTRIL EVERY DAY (Patient taking differently: Place 1 spray into both nostrils 2 (two) times daily.), Disp: 48 mL, Rfl: 1   traZODone  (DESYREL ) 150 MG tablet, Take 1 tablet (150 mg total) by mouth at bedtime as needed for sleep., Disp: 100 tablet, Rfl: 3   trospium  (SANCTURA ) 20 MG tablet, Take 1 tablet (20 mg total) by mouth 2 (two) times daily., Disp: 200 tablet, Rfl: 3   valsartan -hydrochlorothiazide  (DIOVAN -HCT) 320-25 MG tablet, Take 1 tablet by mouth daily., Disp: 100 tablet, Rfl: 3  Allergies  Allergen Reactions   Lovenox [Enoxaparin Sodium] Anaphylaxis   Moxifloxacin Anaphylaxis and Other (See Comments)   Quinolones Hives and Shortness Of Breath   Mucinex  Sinus-Max [Phenylephrine -Apap-Guaifenesin ] Swelling    Red mucinex  tablet    Sulfamethoxazole-Trimethoprim Other (See Comments)    Unknown reaction     ROS: Review of Systems Pertinent items noted in HPI and remainder of comprehensive ROS otherwise negative.    Physical exam BP 128/65   Pulse 83   Temp 98.3 F (36.8 C)   Ht 5' 9 (1.753 m)   Wt 222 lb 8 oz (100.9 kg)   SpO2 93%   BMI 32.86 kg/m  General appearance: alert, cooperative, appears stated age, no distress, and moderately obese Head: Normocephalic, without obvious abnormality, atraumatic Eyes: negative findings: lids and lashes normal, conjunctivae and sclerae normal, corneas clear, and pupils equal, round, reactive to light and accomodation Ears: normal TM's and external ear canals both ears Nose: Nares normal. Septum midline. Mucosa normal.  clear drainage with no sinus tenderness. Throat: lips, mucosa, and tongue normal; teeth and gums normal Neck: no adenopathy, no carotid bruit, supple, symmetrical, trachea midline, and thyroid  not enlarged, symmetric, no tenderness/mass/nodules Back: increased kyphosis of thoracic  spine Lungs: globally decreased breath sounds but no wheezes/ rhonchi or rales. Heart: regular rate and rhythm, S1, S2 normal, no murmur, click, rub or gallop Abdomen: soft, non-tender; bowel sounds normal; no masses,  no organomegaly Extremities: extremities normal, atraumatic, no cyanosis or edema Pulses: 2+ and symmetric Skin: Skin color, texture, turgor normal. No rashes or lesions Lymph nodes: Cervical, supraclavicular, and axillary nodes normal. Neurologic: Grossly normal      05/03/2024   11:43 AM 02/19/2024    2:21 PM 01/26/2024   11:26 AM  Depression screen PHQ 2/9  Decreased Interest 0 0 0  Down, Depressed, Hopeless 0 0 0  PHQ - 2 Score 0 0 0  Altered sleeping 0  0  Tired, decreased energy 0  0  Change in appetite 0  0  Feeling bad or failure about yourself  0  0  Trouble concentrating 0  0  Moving slowly or fidgety/restless 0  0  Suicidal thoughts 0  0  PHQ-9 Score 0  0  Difficult doing work/chores Not difficult at all  Not difficult at all      05/03/2024   11:43 AM 11/18/2023   11:11 AM 05/11/2023    9:26 AM 04/13/2023    8:25 AM  GAD 7 : Generalized Anxiety Score  Nervous, Anxious, on Edge 0 0 0 0  Control/stop worrying 0 0 0 0  Worry too much - different things 0 0 0 0  Trouble relaxing 0 0 0 0  Restless 0 0 0 0  Easily annoyed or irritable 0 0 0 0  Afraid - awful might happen 0 0 0 0  Total GAD 7 Score 0 0 0 0  Anxiety Difficulty Not difficult at all Not difficult at all Not difficult at all     Recent Results (from the past 2160 hours)  Urinalysis, Complete     Status: Abnormal   Collection Time: 02/19/24  2:12 PM  Result Value Ref Range   Specific Gravity, UA 1.010 1.005 -  1.030   pH, UA 6.0 5.0 - 7.5   Color, UA Yellow Yellow   Appearance Ur Clear Clear   Leukocytes,UA Trace (A) Negative   Protein,UA Negative Negative/Trace   Glucose, UA Negative Negative   Ketones, UA Negative Negative   RBC, UA Trace (A) Negative   Bilirubin, UA Negative Negative   Urobilinogen, Ur 0.2 0.2 - 1.0 mg/dL   Nitrite, UA Negative Negative   Microscopic Examination See below:   Microscopic Examination     Status: None   Collection Time: 02/19/24  2:12 PM   Urine  Result Value Ref Range   WBC, UA 0-5 0 - 5 /hpf   RBC, Urine 0-2 0 - 2 /hpf   Epithelial Cells (non renal) 0-10 0 - 10 /hpf   Renal Epithel, UA None seen None seen /hpf   Bacteria, UA None seen None seen/Few   Yeast, UA None seen None seen  Urine Culture     Status: None   Collection Time: 02/19/24  2:16 PM   Specimen: Urine   UR  Result Value Ref Range   Urine Culture, Routine Final report    Organism ID, Bacteria Comment     Comment: Mixed urogenital flora Less than 10,000 colonies/mL      Assessment/ Plan: Kristina Mclaughlin here for annual physical exam.   Annual physical exam  Flu-like symptoms - Plan: Novel Coronavirus, NAA ([facility]), Veritor Flu A/B Waived, DG Chest 2 View, albuterol  (VENTOLIN  HFA)  108 (90 Base) MCG/ACT inhaler, guaiFENesin -codeine 100-10 MG/5ML syrup, benzonatate  (TESSALON  PERLES) 100 MG capsule, predniSONE  (DELTASONE ) 20 MG tablet  Cough variant asthma - Plan: CMP14+EGFR, CBC with Differential, albuterol  (VENTOLIN  HFA) 108 (90 Base) MCG/ACT inhaler, predniSONE  (DELTASONE ) 20 MG tablet  Essential hypertension - Plan: CMP14+EGFR, valsartan -hydrochlorothiazide  (DIOVAN -HCT) 320-25 MG tablet  Hypertriglyceridemia - Plan: CMP14+EGFR, Lipid Panel, TSH, rosuvastatin (CRESTOR) 20 MG tablet, DISCONTINUED: atorvastatin  (LIPITOR) 40 MG tablet  Fatty liver - Plan: CMP14+EGFR, Lipid Panel, CBC with Differential, TSH, rosuvastatin (CRESTOR) 20 MG tablet  Obesity (BMI 30.0-34.9) -  Plan: Bayer DCA Hb A1c Waived, CMP14+EGFR, Lipid Panel, VITAMIN D 25 Hydroxy (Vit-D Deficiency, Fractures)  Pre-diabetes - Plan: Bayer DCA Hb A1c Waived, CMP14+EGFR  Primary insomnia - Plan: traZODone  (DESYREL ) 150 MG tablet  Overactive bladder - Plan: trospium  (SANCTURA ) 20 MG tablet   Defer Flu for now given acute illness.  Check flu/ covid.  CXR negative for pna.  Prednisone  given decreased breath sounds. Unsure if bronchospasm given recent use of albuterol . Recommended rapid covid test today since it is the last day for treatment with Paxlovid.  BP controlled. No changes  Stop Lipitor. Start crestor. FLP/ LFTs today given h/o FL. Discussed ongoing weight reduction to reduce risk of progression/ reverse FL.  BMI down and now no longer morbidly obese.  A1c given h/o preDM.  Insomnia/ OAB stable.  Counseled on healthy lifestyle choices, including diet (rich in fruits, vegetables and lean meats and low in salt and simple carbohydrates) and exercise (at least 30 minutes of moderate physical activity daily).  Patient to follow up 1 year for CPE  Carolynne Schuchard M. Jolinda, DO

## 2024-05-04 LAB — CMP14+EGFR
ALT: 24 IU/L (ref 0–32)
AST: 15 IU/L (ref 0–40)
Albumin: 4.1 g/dL (ref 3.8–4.8)
Alkaline Phosphatase: 98 IU/L (ref 49–135)
BUN/Creatinine Ratio: 26 (ref 12–28)
BUN: 22 mg/dL (ref 8–27)
Bilirubin Total: 0.4 mg/dL (ref 0.0–1.2)
CO2: 23 mmol/L (ref 20–29)
Calcium: 9.3 mg/dL (ref 8.7–10.3)
Chloride: 100 mmol/L (ref 96–106)
Creatinine, Ser: 0.86 mg/dL (ref 0.57–1.00)
Globulin, Total: 2.1 g/dL (ref 1.5–4.5)
Glucose: 182 mg/dL — AB (ref 70–99)
Potassium: 3.8 mmol/L (ref 3.5–5.2)
Sodium: 137 mmol/L (ref 134–144)
Total Protein: 6.2 g/dL (ref 6.0–8.5)
eGFR: 72 mL/min/1.73 (ref >59)

## 2024-05-04 LAB — LIPID PANEL
Cholesterol, Total: 117 mg/dL (ref 100–199)
HDL: 44 mg/dL (ref >39)
LDL CALC COMMENT:: 2.7 ratio (ref 0.0–4.4)
LDL Chol Calc (NIH): 57 mg/dL (ref 0–99)
Triglycerides: 81 mg/dL (ref 0–149)
VLDL Cholesterol Cal: 16 mg/dL (ref 5–40)

## 2024-05-04 LAB — CBC WITH DIFFERENTIAL/PLATELET
Basophils Absolute: 0.1 x10E3/uL (ref 0.0–0.2)
Basos: 1 %
EOS (ABSOLUTE): 0.3 x10E3/uL (ref 0.0–0.4)
Eos: 5 %
Hematocrit: 45.1 % (ref 34.0–46.6)
Hemoglobin: 14.7 g/dL (ref 11.1–15.9)
Immature Grans (Abs): 0 x10E3/uL (ref 0.0–0.1)
Immature Granulocytes: 0 %
Lymphocytes Absolute: 1.4 x10E3/uL (ref 0.7–3.1)
Lymphs: 23 %
MCH: 28.5 pg (ref 26.6–33.0)
MCHC: 32.6 g/dL (ref 31.5–35.7)
MCV: 88 fL (ref 79–97)
Monocytes Absolute: 0.4 x10E3/uL (ref 0.1–0.9)
Monocytes: 7 %
Neutrophils Absolute: 3.9 x10E3/uL (ref 1.4–7.0)
Neutrophils: 63 %
Platelets: 192 x10E3/uL (ref 150–450)
RBC: 5.15 x10E6/uL (ref 3.77–5.28)
RDW: 14.3 % (ref 11.7–15.4)
WBC: 6.1 x10E3/uL (ref 3.4–10.8)

## 2024-05-04 LAB — TSH: TSH: 2.28 u[IU]/mL (ref 0.450–4.500)

## 2024-05-04 LAB — VITAMIN D 25 HYDROXY (VIT D DEFICIENCY, FRACTURES): Vit D, 25-Hydroxy: 71.4 ng/mL (ref 30.0–100.0)

## 2024-05-04 LAB — NOVEL CORONAVIRUS, NAA: SARS-CoV-2, NAA: NOT DETECTED

## 2024-05-12 ENCOUNTER — Telehealth: Payer: Self-pay

## 2024-05-12 DIAGNOSIS — R739 Hyperglycemia, unspecified: Secondary | ICD-10-CM

## 2024-05-12 NOTE — Telephone Encounter (Signed)
 Copied from CRM [id number]. Topic: Clinical - Lab/Test Results >> May 12, 2024  8:26 AM Larissa RAMAN wrote: Reason for CRM: Patient returning call for lab results. Relayed results per provider note, verbatim. Patient states she did have breakfast the morning of lab work, which she states is the reason for elevated blood sugar. She is requesting to have fasting labs done to recheck. No additional questions at this time.

## 2024-05-13 NOTE — Telephone Encounter (Signed)
 Called and spoke with patient and made her aware. Lab appt scheduled on 10/23.

## 2024-05-13 NOTE — Telephone Encounter (Signed)
Repeat BMP placed.

## 2024-05-13 NOTE — Addendum Note (Signed)
 Addended by: JOLINDA NORENE HERO on: 05/13/2024 08:01 AM   Modules accepted: Orders

## 2024-05-17 NOTE — Telephone Encounter (Signed)
 Patient aware and verbalized understanding.

## 2024-05-20 DIAGNOSIS — J3081 Allergic rhinitis due to animal (cat) (dog) hair and dander: Secondary | ICD-10-CM | POA: Diagnosis not present

## 2024-05-20 DIAGNOSIS — J301 Allergic rhinitis due to pollen: Secondary | ICD-10-CM | POA: Diagnosis not present

## 2024-05-20 DIAGNOSIS — J3089 Other allergic rhinitis: Secondary | ICD-10-CM | POA: Diagnosis not present

## 2024-05-26 ENCOUNTER — Other Ambulatory Visit

## 2024-05-26 DIAGNOSIS — J301 Allergic rhinitis due to pollen: Secondary | ICD-10-CM | POA: Diagnosis not present

## 2024-05-26 DIAGNOSIS — R739 Hyperglycemia, unspecified: Secondary | ICD-10-CM

## 2024-05-26 DIAGNOSIS — J3089 Other allergic rhinitis: Secondary | ICD-10-CM | POA: Diagnosis not present

## 2024-05-26 DIAGNOSIS — J3081 Allergic rhinitis due to animal (cat) (dog) hair and dander: Secondary | ICD-10-CM | POA: Diagnosis not present

## 2024-05-26 LAB — BASIC METABOLIC PANEL WITH GFR
BUN/Creatinine Ratio: 17 (ref 12–28)
BUN: 18 mg/dL (ref 8–27)
CO2: 22 mmol/L (ref 20–29)
Calcium: 9.5 mg/dL (ref 8.7–10.3)
Chloride: 103 mmol/L (ref 96–106)
Creatinine, Ser: 1.05 mg/dL — ABNORMAL HIGH (ref 0.57–1.00)
Glucose: 140 mg/dL — ABNORMAL HIGH (ref 70–99)
Potassium: 3.8 mmol/L (ref 3.5–5.2)
Sodium: 140 mmol/L (ref 134–144)
eGFR: 57 mL/min/1.73 — ABNORMAL LOW (ref >59)

## 2024-05-27 ENCOUNTER — Telehealth: Payer: Self-pay | Admitting: Family Medicine

## 2024-05-27 ENCOUNTER — Ambulatory Visit: Payer: Self-pay | Admitting: Family Medicine

## 2024-05-27 NOTE — Telephone Encounter (Signed)
 Copied from CRM [id number]. Topic: Clinical - Lab/Test Results >> May 27, 2024  9:15 AM Delon HERO wrote: Reason for CRM: Patient is calling to report she does not understand lab results. Did not have a kidney injury. And stays away from sugars - unsure why her sugars are elevated.

## 2024-05-30 NOTE — Telephone Encounter (Signed)
 Spoke to pt and message sent tp provider in a separate encounter. LS

## 2024-06-01 ENCOUNTER — Other Ambulatory Visit: Payer: Self-pay | Admitting: Family Medicine

## 2024-06-01 DIAGNOSIS — I10 Essential (primary) hypertension: Secondary | ICD-10-CM

## 2024-06-02 DIAGNOSIS — J3089 Other allergic rhinitis: Secondary | ICD-10-CM | POA: Diagnosis not present

## 2024-06-02 DIAGNOSIS — J3081 Allergic rhinitis due to animal (cat) (dog) hair and dander: Secondary | ICD-10-CM | POA: Diagnosis not present

## 2024-06-02 DIAGNOSIS — J301 Allergic rhinitis due to pollen: Secondary | ICD-10-CM | POA: Diagnosis not present

## 2024-06-08 ENCOUNTER — Encounter: Payer: Self-pay | Admitting: Family Medicine

## 2024-06-08 ENCOUNTER — Telehealth: Payer: Self-pay

## 2024-06-08 ENCOUNTER — Ambulatory Visit (INDEPENDENT_AMBULATORY_CARE_PROVIDER_SITE_OTHER): Admitting: Family Medicine

## 2024-06-08 VITALS — BP 136/83 | HR 70 | Temp 97.6°F | Ht 69.0 in | Wt 226.2 lb

## 2024-06-08 DIAGNOSIS — R5383 Other fatigue: Secondary | ICD-10-CM

## 2024-06-08 DIAGNOSIS — R413 Other amnesia: Secondary | ICD-10-CM | POA: Diagnosis not present

## 2024-06-08 DIAGNOSIS — R0683 Snoring: Secondary | ICD-10-CM

## 2024-06-08 NOTE — Progress Notes (Signed)
 Subjective: CC: Memory changes, fatigue PCP: Jolinda Norene HERO, DO YEP:Kristina Mclaughlin is a 73 y.o. female presenting to clinic today for:  She reports longstanding history of fatigue.  She sleeps sufficient hours but does not always feel well rested.  She does report snoring.  No observed apneas.  She actually comes today because she was told by one of her family members that they were concerned that she might have some type of dementia.  She apparently did not recall an event that she was supposed to attend when she was ill with an upper respiratory infection recently.  She was being treated with a codeine containing product and accidentally threw away an indication.  She wants make sure that she does not have any dementia because this really caused her some concern and now a rift between she and her family member.   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, lateral back   Depression    Essential hypertension 10/29/2015   GERD (gastroesophageal reflux disease)    Hypertension    Insomnia    Migraine headache    Tear of medial meniscus of knee 09/06/2018    Current Outpatient Medications:    acetaminophen  (TYLENOL ) 500 MG tablet, Take 1,000 mg by mouth every 6 (six) hours as needed for moderate pain., Disp: , Rfl:    albuterol  (VENTOLIN  HFA) 108 (90 Base) MCG/ACT inhaler, TAKE 2 PUFFS BY MOUTH EVERY 6 HOURS AS NEEDED FOR WHEEZE OR SHORTNESS OF BREATH, Disp: 8.5 each, Rfl: 1   Biotin 10 MG CAPS, Take 10 mg by mouth daily., Disp: , Rfl:     budesonide -formoterol  (SYMBICORT ) 80-4.5 MCG/ACT inhaler, Take 2 puffs first thing in am and then another 2 puffs about 12 hours later., Disp: 1 each, Rfl: 12   Calcium  Carbonate (CALCIUM  500 PO), Take 500 mg by mouth daily., Disp: , Rfl:    Cholecalciferol (VITAMIN D3) 125 MCG (5000 UT) capsule, Take 5,000 Units by mouth daily., Disp: , Rfl:    diclofenac  Sodium (VOLTAREN ) 1 % GEL, Apply 2 g topically 4 (four) times daily as needed (arthritis)., Disp: 400 g, Rfl: PRN   EPINEPHrine  0.3 mg/0.3 mL IJ SOAJ injection, Inject 0.3 mg into the muscle as needed for anaphylaxis., Disp: , Rfl:    fexofenadine  (ALLEGRA ) 180 MG tablet, Take 1 tablet (180 mg total) by mouth daily., Disp: 90 tablet, Rfl: 3   fluticasone  (FLONASE ) 50 MCG/ACT nasal spray, SPRAY 2 SPRAYS INTO EACH NOSTRIL EVERY DAY (Patient taking differently: Place 1 spray into both nostrils 2 (two) times daily.), Disp: 48 mL, Rfl: 1   Multiple Vitamin (MULTI-VITAMIN PO), Take 1 tablet by mouth daily. , Disp: , Rfl:    Polyethylene Glycol 400 (BLINK TEARS OP), Place 1 drop into both eyes daily., Disp: , Rfl:    Probiotic Product (PROBIOTIC PO), Take 1 capsule by mouth daily., Disp: , Rfl:    rosuvastatin (CRESTOR) 20 MG tablet, Take 1 tablet (20 mg total) by mouth daily. To replace Atorvastatin  (pt states it was recalled?), Disp: 100 tablet, Rfl: 3  SUMAtriptan  (IMITREX ) 100 MG tablet, TAKE 1 TABLET AS NEED FOR MIGRAINE. MAY REPEAT IN 2 HOURS IF HEADACHE PERSISTS OR RECURS., Disp: 8 tablet, Rfl: prn   traZODone  (DESYREL ) 150 MG tablet, Take 1 tablet (150 mg total) by mouth at bedtime as needed for sleep., Disp: 100 tablet, Rfl: 3   trospium  (SANCTURA ) 20 MG tablet, Take 1 tablet (20 mg total) by mouth 2 (two) times daily., Disp: 200 tablet, Rfl: 3   valsartan -hydrochlorothiazide  (DIOVAN -HCT) 320-25 MG tablet, Take 1 tablet by mouth daily., Disp: 100 tablet, Rfl: 3   benzonatate  (TESSALON  PERLES) 100 MG capsule, Take 1 capsule (100 mg total) by mouth  3 (three) times daily as needed. (Patient not taking: Reported on 06/08/2024), Disp: 20 capsule, Rfl: 0   guaiFENesin -codeine 100-10 MG/5ML syrup, Take 5 mLs by mouth every 6 (six) hours as needed for cough. (Patient not taking: Reported on 06/08/2024), Disp: 120 mL, Rfl: 0   predniSONE  (DELTASONE ) 20 MG tablet, 2 po at same time daily for 5 days (Patient not taking: Reported on 06/08/2024), Disp: 10 tablet, Rfl: 0   traMADol (ULTRAM) 50 MG tablet, Take 50 mg by mouth every 6 (six) hours as needed. (Patient not taking: Reported on 06/08/2024), Disp: , Rfl:  Social History   Socioeconomic History   Marital status: Married    Spouse name: Not on file   Number of children: 2   Years of education: Not on file   Highest education level: Associate degree: occupational, scientist, product/process development, or vocational program  Occupational History   Occupation: Technical Brewer   Tobacco Use   Smoking status: Former    Current packs/day: 0.00    Average packs/day: 0.3 packs/day for 2.0 years (0.5 ttl pk-yrs)    Types: Cigarettes    Start date: 60    Quit date: 1980    Years since quitting: 45.8   Smokeless tobacco: Never  Vaping Use   Vaping status: Never Used  Substance and Sexual Activity   Alcohol use: No    Alcohol/week: 0.0 standard drinks of alcohol   Drug use: No   Sexual activity: Not on file  Other Topics Concern   Not on file  Social History Narrative   She is a retired Therapist, Occupational.  She is married with 2 daughters and 4 grandchildren, all who live locally.  She tries to stay active.   Social Drivers of Corporate Investment Banker Strain: Low Risk  (05/02/2024)   Overall Financial Resource Strain (CARDIA)    Difficulty of Paying Living Expenses: Not hard at all  Food Insecurity: No Food Insecurity (05/02/2024)   Hunger Vital Sign    Worried About Running Out of Food in the Last Year: Never true    Ran Out of Food in the Last Year: Never true  Transportation Needs: No Transportation  Needs (05/02/2024)   PRAPARE - Administrator, Civil Service (Medical): No    Lack of Transportation (Non-Medical): No  Physical Activity: Inactive (05/02/2024)   Exercise Vital Sign    Days of Exercise per Week: 0 days    Minutes of Exercise per Session: Not on file  Stress: No Stress Concern Present (05/02/2024)   [employer] of Occupational Health - Occupational Stress Questionnaire    Feeling of Stress: Only a little  Social Connections: Socially Integrated (05/02/2024)   Social Connection and Isolation Panel    Frequency of Communication with Friends and Family: Twice a week    Frequency of Social Gatherings with  Friends and Family: Twice a week    Attends Religious Services: More than 4 times per year    Active Member of [employer] or Organizations: Yes    Attends Banker Meetings: 1 to 4 times per year    Marital Status: Married  Catering Manager Violence: Not At Risk (01/26/2024)   Humiliation, Afraid, Rape, and Kick questionnaire    Fear of Current or Ex-Partner: No    Emotionally Abused: No    Physically Abused: No    Sexually Abused: No   Family History  Problem Relation Age of Onset   Bladder Cancer Mother    Allergies Mother    Heart disease Mother    Allergies Sister    Alzheimer's disease Father    Breast cancer Neg Hx     Objective: Office vital signs reviewed. BP 136/83   Pulse 70   Temp 97.6 F (36.4 C)   Ht 5' 9 (1.753 m)   Wt 226 lb 3.2 oz (102.6 kg)   SpO2 94%   BMI 33.40 kg/m   Physical Examination:  General: Awake, alert, nontoxic obese female, No acute distress HEENT: sclera white, MMM. Slight micrognathia Neuro: No focal neurologic deficits.     06/08/2024    3:17 PM  MMSE - Mini Mental State Exam  Orientation to time 4  Orientation to Place 5  Registration 3  Attention/ Calculation 5  Recall 3  Language- name 2 objects 2  Language- repeat 1  Language- follow 3 step command 3  Language- read & follow  direction 1  Write a sentence 1  Copy design 1  Total score 29     Assessment/ Plan: 73 y.o. female   Memory change - Plan: Vitamin B12  Low energy - Plan: Vitamin B12, Ambulatory referral to Sleep Studies  Snoring - Plan: Ambulatory referral to Sleep Studies   Memory change may have been related to use of codeine containing product in the setting of acute illness.  She demonstrates no evidence of dementia or memory problems on exam today with a score of 29 out of 30 on the MMSE.  She had no focal neurologic deficits.  Stop bang score 4.  Referral for home sleep study placed.  Will gladly check for B12 deficiency but remainder of labs were normal just 2 months ago   Norene CHRISTELLA Fielding, DO [facility] [phone]

## 2024-06-08 NOTE — Telephone Encounter (Signed)
 Noted. Looks like referral notes list [facility] in [place] as patients preference. Still pending at this time. Will follow up with patient on referral once there is more info. Will forward to referral team for FYI.

## 2024-06-08 NOTE — Telephone Encounter (Signed)
 Copied from CRM [id number]. Topic: Clinical - Request for Lab/Test Order >> Jun 08, 2024  4:09 PM Delon DASEN wrote: Reason for CRM: Patient requesting to do sleep study at facility instead of at home- [phone]

## 2024-06-09 DIAGNOSIS — J3089 Other allergic rhinitis: Secondary | ICD-10-CM | POA: Diagnosis not present

## 2024-06-09 DIAGNOSIS — J3081 Allergic rhinitis due to animal (cat) (dog) hair and dander: Secondary | ICD-10-CM | POA: Diagnosis not present

## 2024-06-09 DIAGNOSIS — J301 Allergic rhinitis due to pollen: Secondary | ICD-10-CM | POA: Diagnosis not present

## 2024-06-09 LAB — VITAMIN B12: Vitamin B-12: 895 pg/mL (ref 232–1245)

## 2024-06-10 ENCOUNTER — Ambulatory Visit: Payer: Self-pay | Admitting: Family Medicine

## 2024-06-10 ENCOUNTER — Encounter: Payer: Self-pay | Admitting: Family Medicine

## 2024-06-10 NOTE — Telephone Encounter (Signed)
 Can we make sure that sleep study is rerouted to a sleep med provider in [place]. Looks like she prefers a NONhome sleep study.

## 2024-06-29 ENCOUNTER — Ambulatory Visit: Payer: Self-pay

## 2024-06-29 ENCOUNTER — Encounter: Payer: Self-pay | Admitting: Family Medicine

## 2024-06-29 ENCOUNTER — Ambulatory Visit: Admitting: Family Medicine

## 2024-06-29 VITALS — BP 124/76 | HR 75 | Temp 97.8°F | Ht 69.0 in | Wt 224.2 lb

## 2024-06-29 DIAGNOSIS — J069 Acute upper respiratory infection, unspecified: Secondary | ICD-10-CM | POA: Diagnosis not present

## 2024-06-29 DIAGNOSIS — J45991 Cough variant asthma: Secondary | ICD-10-CM

## 2024-06-29 DIAGNOSIS — R0981 Nasal congestion: Secondary | ICD-10-CM | POA: Diagnosis not present

## 2024-06-29 DIAGNOSIS — J029 Acute pharyngitis, unspecified: Secondary | ICD-10-CM | POA: Diagnosis not present

## 2024-06-29 LAB — VERITOR SARS-COV-2 AND FLU A+B
BD Veritor SARS-CoV-2 Ag: NEGATIVE
Influenza A: NEGATIVE
Influenza B: NEGATIVE

## 2024-06-29 MED ORDER — PREDNISONE 20 MG PO TABS: 40.0000 mg | ORAL_TABLET | Freq: Every day | ORAL | 0 refills | Status: AC

## 2024-06-29 MED ORDER — AMOXICILLIN-POT CLAVULANATE 875-125 MG PO TABS: 1.0000 | ORAL_TABLET | Freq: Two times a day (BID) | ORAL | 0 refills | Status: AC

## 2024-06-29 NOTE — Addendum Note (Signed)
 Addended by: JOESPH ANNABELLA HERO on: 06/29/2024 09:34 AM   Modules accepted: Level of Service

## 2024-06-29 NOTE — Telephone Encounter (Signed)
 FYI Only or Action Required?: FYI only for provider: appointment scheduled on 06/29/2024.  Patient was last seen in primary care on 06/08/2024 by Jolinda Norene HERO, DO.  Called Nurse Triage reporting Sore Throat.  Symptoms began 2 to 3 days.  Interventions attempted: Nothing.  Symptoms are: gradually worsening.  Triage Disposition: See Physician Within 24 Hours  Patient/caregiver understands and will follow disposition?: Yes   Copied from CRM #[id number]. Topic: Clinical - Red Word Triage >> Jun 29, 2024  8:12 AM Alfonso ORN wrote: Red Word that prompted transfer to Nurse Triage: coughing up and blowing out nose green mucus , sore throat Reason for Disposition  SEVERE throat pain (e.g., excruciating)  Answer Assessment - Initial Assessment Questions 1. ONSET: When did the throat start hurting? (Hours or days ago)      X 2 to 3 days 2. SEVERITY: How bad is the sore throat? (Scale 1-10; mild, moderate or severe)  Mild to  moderate 3. STREP EXPOSURE: Has there been any exposure to strep within the past week? If Yes, ask: What type of contact occurred?      na 4.  VIRAL SYMPTOMS: Are there any symptoms of a cold, such as a runny nose, cough, hoarse voice or red eyes?      Cough & blowing nose with green mucous 5. FEVER: Do you have a fever? If Yes, ask: What is your temperature, how was it measured, and when did it start?     no 6. PUS ON THE TONSILS: Is there pus on the tonsils in the back of your throat?     no 7. OTHER SYMPTOMS: Do you have any other symptoms? (e.g., difficulty breathing, headache, rash)     Headache, reddness 8. PREGNANCY: Is there any chance you are pregnant? When was your last menstrual period?     Na  Pt stated she tested negative for COVID  Protocols used: Sore Throat-A-AH

## 2024-06-29 NOTE — Telephone Encounter (Signed)
 Appointment scheduled.

## 2024-06-29 NOTE — Progress Notes (Addendum)
 Acute Office Visit  Subjective:     Patient ID: Kristina Mclaughlin, female    DOB: [birth year], 73 y.o.   MRN: [id number]  Chief Complaint  Patient presents with   Cough    Symptoms started Friday and has gotten worse. Coughing up green phlegm    Sore Throat   Nasal Congestion    Cough  Sore Throat  Associated symptoms include coughing.    History of Present Illness   Kristina Mclaughlin is a 73 year old female with cough variant asthma who presents with cough, congestion, and sore throat for five days.  Upper respiratory symptoms - Cough, congestion, and sore throat for five days - Sore throat worsening over time - Mucus described as 'dark, yucky green' - No ear pain, fever, chills, nausea, vomiting, or diarrhea - Feels 'stopped up'  Asthma symptoms and management - History of cough variant asthma - Uses Symbicort  daily - No recent use of albuterol  inhaler - No wheezing or shortness of breath  Allergy management - Uses saline nasal spray and Allegra  - Receives allergy shots - No longer uses Flonase   Over-the-counter medication use - Uses Mucinex  and Nyquil with some relief       Review of Systems  Respiratory:  Positive for cough.    As per HPI.      Objective:    BP 124/76   Pulse 75   Temp 97.8 F (36.6 C) (Temporal)   Ht 5' 9 (1.753 m)   Wt 224 lb 3.2 oz (101.7 kg)   SpO2 94%   BMI 33.11 kg/m    Physical Exam Vitals and nursing note reviewed.  Constitutional:      General: She is not in acute distress.    Appearance: She is not toxic-appearing or diaphoretic.  HENT:     Head: Normocephalic and atraumatic.     Right Ear: Ear canal and external ear normal. A middle ear effusion is present. No mastoid tenderness. Tympanic membrane is not erythematous, retracted or bulging.     Left Ear: Ear canal and external ear normal. A middle ear effusion is present. No mastoid tenderness. Tympanic membrane is not erythematous, retracted or  bulging.     Nose: Congestion present.     Right Sinus: Maxillary sinus tenderness present. No frontal sinus tenderness.     Left Sinus: Maxillary sinus tenderness present. No frontal sinus tenderness.     Mouth/Throat:     Mouth: Mucous membranes are moist.     Pharynx: Posterior oropharyngeal erythema present. No pharyngeal swelling, oropharyngeal exudate, uvula swelling or postnasal drip.     Tonsils: No tonsillar exudate or tonsillar abscesses. 1+ on the right. 1+ on the left.  Eyes:     General:        Right eye: No discharge.        Left eye: No discharge.  Cardiovascular:     Rate and Rhythm: Normal rate and regular rhythm.     Heart sounds: Normal heart sounds. No murmur heard. Pulmonary:     Effort: Pulmonary effort is normal. No respiratory distress.     Breath sounds: Normal breath sounds. No wheezing or rhonchi.  Musculoskeletal:     Cervical back: Neck supple. No rigidity.  Lymphadenopathy:     Cervical: No cervical adenopathy.  Skin:    General: Skin is warm and dry.  Neurological:     General: No focal deficit present.     Mental Status: She is alert and oriented  to person, place, and time.  Psychiatric:        Mood and Affect: Mood normal.        Behavior: Behavior normal.     No results found for any visits on 06/29/24.      Assessment & Plan:   Roshana was seen today for cough, sore throat and nasal congestion.  Diagnoses and all orders for this visit:  Acute URI -     Veritor SARS-CoV-2 and Flu A+B -     amoxicillin -clavulanate (AUGMENTIN ) 875-125 MG tablet; Take 1 tablet by mouth 2 (two) times daily for 7 days.  Cough variant asthma -     predniSONE  (DELTASONE ) 20 MG tablet; Take 2 tablets (40 mg total) by mouth daily with breakfast for 5 days.   Assessment and Plan    Acute upper respiratory infection  Negative COVID,  flu A, flu B tests. - Prescribed Augmentin  BID for one week. Start if symptoms worsen or do not improve in 2 days.   Cough  variant asthma No recent albuterol  use. No wheezing or shortness of breath. Prednisone  prescribed for potential exacerbation given upcoming long weekend. - Prescribed prednisone  2 pills in the morning for 5 days if increased wheezing, coughing fits, or increased albuterol  use.      Return to office for new or worsening symptoms, or if symptoms persist.   The patient indicates understanding of these issues and agrees with the plan.  Kristina CHRISTELLA Search, FNP

## 2024-07-06 DIAGNOSIS — J3089 Other allergic rhinitis: Secondary | ICD-10-CM | POA: Diagnosis not present

## 2024-07-06 DIAGNOSIS — J301 Allergic rhinitis due to pollen: Secondary | ICD-10-CM | POA: Diagnosis not present

## 2024-07-06 DIAGNOSIS — J3081 Allergic rhinitis due to animal (cat) (dog) hair and dander: Secondary | ICD-10-CM | POA: Diagnosis not present

## 2024-07-18 ENCOUNTER — Ambulatory Visit: Payer: Self-pay

## 2024-07-18 NOTE — Telephone Encounter (Signed)
Noted  -LS

## 2024-07-18 NOTE — Telephone Encounter (Signed)
 FYI Only or Action Required?: FYI only for provider: appointment scheduled on tomorrow.  Patient was last seen in primary care on 06/29/2024 by Joesph Annabella HERO, FNP.  Called Nurse Triage reporting Mouth Lesions.  Symptoms began a week ago.  Interventions attempted: Rest, hydration, or home remedies.  Symptoms are: gradually worsening.  Triage Disposition: See Within 3 Days in Office  Patient/caregiver understands and will follow disposition?: Yes, will follow disposition  Summary: sore tongue with little bumps   Reason for Triage: sore tongue with little bumps. Burned when eating kiwi.         Answer Assessment - Initial Assessment Questions 1. LOCATION: Where is the mouth sore (ulcer) located?      tongue 2. NUMBER: How many sores are there? :     Whole tongue is sore 4. PAIN: Are they painful? If Yes, ask: How bad is it?  (Scale 0-10; or none, mild, moderate, severe)     moderate 5. ONSET: When did you first notice the sore?      About a week ago 6. RECURRENT SYMPTOM: Have you had a mouth ulcer before? If Yes, ask: When was the last time? and What happened that time?      Has never had evaluation for this 7. CAUSE: What do you think is causing the mouth sore?     unsure 8. OTHER SYMPTOMS: Do you have any other symptoms? (e.g., fever, swollen lymph node)     Denies  Denies new medications, denies recent dental procedures.  Protocols used: Mouth Ulcers-A-AH

## 2024-07-19 ENCOUNTER — Ambulatory Visit

## 2024-07-19 ENCOUNTER — Encounter: Payer: Self-pay | Admitting: Family Medicine

## 2024-07-19 VITALS — BP 126/71 | HR 70 | Temp 95.7°F | Ht 69.0 in | Wt 225.8 lb

## 2024-07-19 DIAGNOSIS — B370 Candidal stomatitis: Secondary | ICD-10-CM

## 2024-07-19 MED ORDER — CLOTRIMAZOLE 10 MG MT TROC: 10.0000 mg | Freq: Every day | OROMUCOSAL | 0 refills | Status: AC

## 2024-07-19 NOTE — Progress Notes (Signed)
 Subjective:  Patient ID: Kristina Mclaughlin, female    DOB: [birth year], 73 y.o.   MRN: [id number]  Patient Care Team: Jolinda Norene HERO, DO as PCP - General (Family Medicine) Livingston Rigg, MD as Consulting Physician (Dermatology) Cheryn Nickels, MD as Referring Physician (Allergy and Immunology) Vicci Mcardle, OD (Optometry)   Chief Complaint:  sore on tounge  (X 2 weeks )   HPI: Kristina Mclaughlin is a 73 y.o. female presenting on 07/19/2024 for sore on tounge  (X 2 weeks )   Breena Mclaughlin is a 73 year old female who presents with oral discomfort and burning sensation in the mouth.  She has been experiencing discomfort in her mouth and tongue for approximately two weeks, describing a burning sensation that is particularly noticeable after consuming kiwi, which she has been eating one to two times daily. Her tongue feels like it is 'on fire' after eating kiwi.  She has noticed discoloration on the outer edge of her tongue and small white bumps at the back of her tongue, which she has attempted to pop. Additionally, there is a white coating on her tongue that has persisted for a couple of weeks.  No recent antibiotic use or changes in medication. Due to concerns about sugar intake, she does not consume much fruit.  Her past medical history includes prediabetes, with recent efforts to manage her blood sugar through dietary changes, resulting in a weight loss of approximately 28 pounds. Her A1c levels have been around 6.3 to 6.4.  During the review of symptoms, she confirms the presence of burning, sores, and a white coating in the mouth.          Relevant past medical, surgical, family, and social history reviewed and updated as indicated.  Allergies and medications reviewed and updated. Data reviewed: Chart in Epic.   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, lateral  back   Depression    Essential hypertension 10/29/2015   GERD (gastroesophageal reflux disease)    Hypertension    Insomnia    Migraine headache    Tear of medial meniscus of knee 09/06/2018    Past Surgical History:  Procedure Laterality Date   ABDOMINAL HYSTERECTOMY     BACK SURGERY     BREAST SURGERY     biopsy x2; reduction   EXAMINATION UNDER ANESTHESIA  08/27/2012   LIPOMA EXCISION Right 07/03/2021   Procedure: EXCISION LIPOMA; ANKLE;  Surgeon: Kallie Manuelita BROCKS, MD;  Location: [facility];  Service: General;  Laterality: Right;   LIPOMA EXCISION N/A 07/03/2021   Procedure: EXCISION LIPOMA; SUPRAPUBIC;  Surgeon: Kallie Manuelita BROCKS, MD;  Location: [facility];  Service: General;  Laterality: N/A;   MASS EXCISION Left 07/03/2021   Procedure: EXCISION LIPOMA; ANKLE;  Surgeon: Kallie Manuelita BROCKS, MD;  Location: [facility];  Service: General;  Laterality: Left;   MASS EXCISION Left 07/03/2021   Procedure: EXCISION OF LIPOMA; ARM;  Surgeon: Kallie Manuelita BROCKS, MD;  Location: [facility];  Service: General;  Laterality: Left;   NECK SURGERY     Mass removed   REDUCTION MAMMAPLASTY Bilateral 2002   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   XI ROBOTIC ASSISTED HIATAL HERNIA REPAIR N/A 11/18/2022   Procedure: ROBOTIC HIATAL HERNIA REPAIR;  Surgeon: Lyndel Deward PARAS, MD;  Location: [facility];  Service: General;  Laterality: N/A;    Social History  Socioeconomic History   Marital status: Married    Spouse name: Not on file   Number of children: 2   Years of education: Not on file   Highest education level: Associate degree: occupational, scientist, product/process development, or vocational program  Occupational History   Occupation: Purchasing   Tobacco Use   Smoking status: Former    Current packs/day: 0.00    Average packs/day: 0.3 packs/day for 2.0 years (0.5 ttl pk-yrs)    Types: Cigarettes    Start date: 25    Quit date: 1980    Years since quitting: 45.9   Smokeless tobacco: Never  Vaping Use   Vaping  status: Never Used  Substance and Sexual Activity   Alcohol use: No    Alcohol/week: 0.0 standard drinks of alcohol   Drug use: No   Sexual activity: Not on file  Other Topics Concern   Not on file  Social History Narrative   She is a retired Therapist, Occupational.  She is married with 2 daughters and 4 grandchildren, all who live locally.  She tries to stay active.   Social Drivers of Health   Tobacco Use: Medium Risk (07/19/2024)   Patient History    Smoking Tobacco Use: Former    Smokeless Tobacco Use: Never    Passive Exposure: Not on file  Financial Resource Strain: Low Risk (05/02/2024)   Overall Financial Resource Strain (CARDIA)    Difficulty of Paying Living Expenses: Not hard at all  Food Insecurity: No Food Insecurity (05/02/2024)   Epic    Worried About Programme Researcher, Broadcasting/film/video in the Last Year: Never true    Ran Out of Food in the Last Year: Never true  Transportation Needs: No Transportation Needs (05/02/2024)   Epic    Lack of Transportation (Medical): No    Lack of Transportation (Non-Medical): No  Physical Activity: Inactive (05/02/2024)   Exercise Vital Sign    Days of Exercise per Week: 0 days    Minutes of Exercise per Session: Not on file  Stress: No Stress Concern Present (05/02/2024)   [employer] of Occupational Health - Occupational Stress Questionnaire    Feeling of Stress: Only a little  Social Connections: Socially Integrated (05/02/2024)   Social Connection and Isolation Panel    Frequency of Communication with Friends and Family: Twice a week    Frequency of Social Gatherings with Friends and Family: Twice a week    Attends Religious Services: More than 4 times per year    Active Member of Clubs or Organizations: Yes    Attends Banker Meetings: 1 to 4 times per year    Marital Status: Married  Catering Manager Violence: Not At Risk (01/26/2024)   Epic    Fear of Current or Ex-Partner: No    Emotionally Abused: No     Physically Abused: No    Sexually Abused: No  Depression (PHQ2-9): Low Risk (06/08/2024)   Depression (PHQ2-9)    PHQ-2 Score: 0  Alcohol Screen: Low Risk (01/26/2024)   Alcohol Screen    Last Alcohol Screening Score (AUDIT): 0  Housing: Low Risk (05/02/2024)   Epic    Unable to Pay for Housing in the Last Year: No    Number of Times Moved in the Last Year: 0    Homeless in the Last Year: No  Utilities: Not At Risk (01/26/2024)   Epic    Threatened with loss of utilities: No  Health Literacy: Adequate Health Literacy (01/26/2024)  B1300 Health Literacy    Frequency of need for help with medical instructions: Never    Outpatient Encounter Medications as of 07/19/2024  Medication Sig   acetaminophen  (TYLENOL ) 500 MG tablet Take 1,000 mg by mouth every 6 (six) hours as needed for moderate pain.   albuterol  (VENTOLIN  HFA) 108 (90 Base) MCG/ACT inhaler TAKE 2 PUFFS BY MOUTH EVERY 6 HOURS AS NEEDED FOR WHEEZE OR SHORTNESS OF BREATH   Biotin 10 MG CAPS Take 10 mg by mouth daily.   budesonide -formoterol  (SYMBICORT ) 80-4.5 MCG/ACT inhaler Take 2 puffs first thing in am and then another 2 puffs about 12 hours later.   Calcium  Carbonate (CALCIUM  500 PO) Take 500 mg by mouth daily.   Cholecalciferol (VITAMIN D3) 125 MCG (5000 UT) capsule Take 5,000 Units by mouth daily.   clotrimazole  (MYCELEX ) 10 MG troche Take 1 tablet (10 mg total) by mouth 5 (five) times daily for 7 days.   diclofenac  Sodium (VOLTAREN ) 1 % GEL Apply 2 g topically 4 (four) times daily as needed (arthritis).   EPINEPHrine  0.3 mg/0.3 mL IJ SOAJ injection Inject 0.3 mg into the muscle as needed for anaphylaxis.   fexofenadine  (ALLEGRA ) 180 MG tablet Take 1 tablet (180 mg total) by mouth daily.   Multiple Vitamin (MULTI-VITAMIN PO) Take 1 tablet by mouth daily.    Polyethylene Glycol 400 (BLINK TEARS OP) Place 1 drop into both eyes daily.   Probiotic Product (PROBIOTIC PO) Take 1 capsule by mouth daily.   rosuvastatin  (CRESTOR ) 20  MG tablet Take 1 tablet (20 mg total) by mouth daily. To replace Atorvastatin  (pt states it was recalled?)   SUMAtriptan  (IMITREX ) 100 MG tablet TAKE 1 TABLET AS NEED FOR MIGRAINE. MAY REPEAT IN 2 HOURS IF HEADACHE PERSISTS OR RECURS.   traZODone  (DESYREL ) 150 MG tablet Take 1 tablet (150 mg total) by mouth at bedtime as needed for sleep.   trospium  (SANCTURA ) 20 MG tablet Take 1 tablet (20 mg total) by mouth 2 (two) times daily.   valsartan -hydrochlorothiazide  (DIOVAN -HCT) 320-25 MG tablet Take 1 tablet by mouth daily.   benzonatate  (TESSALON  PERLES) 100 MG capsule Take 1 capsule (100 mg total) by mouth 3 (three) times daily as needed. (Patient not taking: Reported on 07/19/2024)   traMADol (ULTRAM) 50 MG tablet Take 50 mg by mouth every 6 (six) hours as needed. (Patient not taking: Reported on 07/19/2024)   No facility-administered encounter medications on file as of 07/19/2024.    Allergies[1]  Pertinent ROS per HPI, otherwise unremarkable      Objective:  BP 126/71   Pulse 70   Temp (!) 95.7 F (35.4 C)   Ht 5' 9 (1.753 m)   Wt 225 lb 12.8 oz (102.4 kg)   SpO2 93%   BMI 33.34 kg/m    Wt Readings from Last 3 Encounters:  07/19/24 225 lb 12.8 oz (102.4 kg)  06/29/24 224 lb 3.2 oz (101.7 kg)  06/08/24 226 lb 3.2 oz (102.6 kg)    Physical Exam Vitals and nursing note reviewed.  Constitutional:      General: She is not in acute distress.    Appearance: Normal appearance. She is well-developed and well-groomed. She is obese. She is not ill-appearing, toxic-appearing or diaphoretic.  HENT:     Head: Normocephalic and atraumatic.     Jaw: There is normal jaw occlusion.     Right Ear: Hearing normal.     Left Ear: Hearing normal.     Nose: Nose normal.  Mouth/Throat:     Lips: Pink.     Mouth: Mucous membranes are moist.     Tongue: Lesions present.     Pharynx: Oropharynx is clear. Uvula midline.     Comments: White coating to tongue with white patches to border of  tongue Eyes:     General: Lids are normal.     Extraocular Movements: Extraocular movements intact.     Conjunctiva/sclera: Conjunctivae normal.     Pupils: Pupils are equal, round, and reactive to light.  Neck:     Trachea: Trachea and phonation normal.  Cardiovascular:     Rate and Rhythm: Normal rate.  Pulmonary:     Effort: Pulmonary effort is normal.  Abdominal:     Hernia: No hernia is present.  Musculoskeletal:        General: Normal range of motion.     Cervical back: Normal range of motion and neck supple.  Skin:    General: Skin is warm and dry.     Capillary Refill: Capillary refill takes less than 2 seconds.     Coloration: Skin is not cyanotic.  Neurological:     General: No focal deficit present.     Mental Status: She is alert and oriented to person, place, and time.     Sensory: Sensation is intact.     Motor: Motor function is intact.     Coordination: Coordination is intact.     Gait: Gait is intact.     Deep Tendon Reflexes: Reflexes are normal and symmetric.  Psychiatric:        Attention and Perception: Attention and perception normal.        Mood and Affect: Mood and affect normal.        Speech: Speech normal.        Behavior: Behavior normal. Behavior is cooperative.        Thought Content: Thought content normal.        Cognition and Memory: Cognition and memory normal.        Judgment: Judgment normal.      Results for orders placed or performed in visit on 06/29/24  Veritor SARS-CoV-2 and Flu A+B   Collection Time: 06/29/24  8:58 AM   Specimen: Nasal Swab   Nasal Swab  Result Value Ref Range   Influenza A Negative Negative   Influenza B Negative Negative   BD Veritor SARS-CoV-2 Ag Negative Negative       Pertinent labs & imaging results that were available during my care of the patient were reviewed by me and considered in my medical decision making.  Assessment & Plan:  Lasheika was seen today for sore on tounge .  Diagnoses and all  orders for this visit:  Thrush, oral -     clotrimazole  (MYCELEX ) 10 MG troche; Take 1 tablet (10 mg total) by mouth 5 (five) times daily for 7 days.      Oral candidiasis White coating on the tongue and white patches along the side of the mouth for approximately two weeks. Symptoms include burning sensation and sores. Likely exacerbated by prediabetes and elevated blood sugars, which increase susceptibility to yeast infections. No prior history of thrush. - Prescribed nystatin troches for 7 days. - Instructed to change toothbrush after 2 days of treatment and again after completing the course. - Advised to use over-the-counter Biotene mouthwash for antiseptic effect. - Recommended low-dose liquid Benadryl for symptomatic relief if needed. - Instructed to contact if symptoms persist by day  5 for potential extension of treatment to 10 days.          Continue all other maintenance medications.  Follow up plan: Return if symptoms worsen or fail to improve.   Continue healthy lifestyle choices, including diet (rich in fruits, vegetables, and lean proteins, and low in salt and simple carbohydrates) and exercise (at least 30 minutes of moderate physical activity daily).  Educational handout given for thrush  The above assessment and management plan was discussed with the patient. The patient verbalized understanding of and has agreed to the management plan. Patient is aware to call the clinic if they develop any new symptoms or if symptoms persist or worsen. Patient is aware when to return to the clinic for a follow-up visit. Patient educated on when it is appropriate to go to the emergency department.   Rosaline Bruns, FNP-C [facility] [phone]     [1]  Allergies Allergen Reactions   Lovenox [Enoxaparin Sodium] Anaphylaxis   Moxifloxacin Anaphylaxis and Other (See Comments)   Quinolones Hives and Shortness Of Breath   Mucinex  Sinus-Max  [Phenylephrine -Apap-Guaifenesin ] Swelling    Red mucinex  tablet    Sulfamethoxazole-Trimethoprim Other (See Comments)    Unknown reaction

## 2024-07-29 ENCOUNTER — Telehealth: Payer: Self-pay

## 2024-07-29 ENCOUNTER — Other Ambulatory Visit: Payer: Self-pay

## 2024-07-29 DIAGNOSIS — B370 Candidal stomatitis: Secondary | ICD-10-CM

## 2024-07-29 MED ORDER — CLOTRIMAZOLE 10 MG MT TROC: 10.0000 mg | Freq: Every day | OROMUCOSAL | 0 refills | Status: AC

## 2024-07-29 NOTE — Telephone Encounter (Signed)
Medication sent to pharmacy, patient aware. 

## 2024-07-29 NOTE — Telephone Encounter (Signed)
 Copied from CRM #[id number]. Topic: Clinical - Medication Question >> Jul 29, 2024 11:57 AM Emylou G wrote: Reason for CRM: Patient saw dr for thrush.. was told to callback if she needed more Clotrimazole  10 mg Oral 5 times daily - it's almost better so needs more.. Can we fill?  It's not on her list?

## 2024-08-11 ENCOUNTER — Ambulatory Visit: Admitting: Neurology

## 2024-08-11 ENCOUNTER — Encounter: Payer: Self-pay | Admitting: Neurology

## 2024-08-11 VITALS — BP 142/75 | HR 66 | Ht 69.0 in | Wt 226.6 lb

## 2024-08-11 DIAGNOSIS — G4700 Insomnia, unspecified: Secondary | ICD-10-CM | POA: Diagnosis not present

## 2024-08-11 DIAGNOSIS — Z9189 Other specified personal risk factors, not elsewhere classified: Secondary | ICD-10-CM | POA: Diagnosis not present

## 2024-08-11 DIAGNOSIS — R351 Nocturia: Secondary | ICD-10-CM

## 2024-08-11 DIAGNOSIS — G4719 Other hypersomnia: Secondary | ICD-10-CM

## 2024-08-11 DIAGNOSIS — E66811 Obesity, class 1: Secondary | ICD-10-CM

## 2024-08-11 DIAGNOSIS — R0683 Snoring: Secondary | ICD-10-CM

## 2024-08-11 NOTE — Patient Instructions (Addendum)

## 2024-08-11 NOTE — Progress Notes (Signed)
 Subjective:    Patient ID: Kristina Mclaughlin is a 74 y.o. female.  HPI     True Mar, MD, PhD [facility] [address], [address] [address] [place], [place] [address]  Dear Dr. Jolinda,  I saw your patient, Kristina Mclaughlin, upon your kind request in my sleep clinic today for initial consultation of her sleep disorder, in particular, concern for underlying obstructive sleep apnea.  The patient is unaccompanied today.  As you know, [name] is a 74 year old female with an underlying medical history of memory change, hypertension, migraine headache, arthritis, asthma, allergic rhinitis, depression, reflux disease, and obesity, who reports snoring and excessive daytime somnolence.  Her Epworth sleepiness score is 10 out of 24, fatigue severity score is 40 out of 63.  I reviewed your office note from 06/08/2024.  She has chronic difficulty initiating and maintaining sleep and has been on trazodone  for the past 2 to 3 years, takes 150 mg each night, typically around 9:30 PM but may not go to bed until 11.  She has to be up for work at 6.  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  She has nocturia about once or twice per average night.  She also takes melatonin on most nights, 10 mg strength.  She is not aware of any family history of sleep apnea.  She is working on weight loss.  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  She does not drink caffeine daily, usually decaf coffee if any, not even every day.  She does not drink any alcohol and does not smoke.  She lives with her husband who is also retired.  They have 2 grown daughters and 5 grandchildren.  They have 1 dog in the household and the dog typically sleeps in her husband's room at night.  She does not watch TV in her bedroom.  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, lateral back   Depression    Essential hypertension 10/29/2015   GERD (gastroesophageal reflux disease)    Hypertension    Insomnia    Migraine headache    Tear of medial meniscus of knee 09/06/2018    Her Past Surgical History Is Significant For: Past Surgical History:  Procedure Laterality Date   ABDOMINAL HYSTERECTOMY     BACK SURGERY     BREAST SURGERY     biopsy x2; reduction   EXAMINATION UNDER ANESTHESIA  08/27/2012   LIPOMA EXCISION Right 07/03/2021   Procedure: EXCISION LIPOMA; ANKLE;  Surgeon: Kallie Manuelita BROCKS, MD;  Location: [facility];  Service: General;  Laterality: Right;   LIPOMA EXCISION N/A 07/03/2021   Procedure: EXCISION LIPOMA; SUPRAPUBIC;  Surgeon: Kallie Manuelita BROCKS, MD;  Location: [facility];  Service: General;  Laterality: N/A;   MASS EXCISION Left 07/03/2021   Procedure: EXCISION LIPOMA; ANKLE;  Surgeon: Kallie Manuelita BROCKS, MD;  Location: [facility];  Service: General;  Laterality: Left;   MASS EXCISION Left 07/03/2021   Procedure: EXCISION OF LIPOMA; ARM;  Surgeon: Kallie Manuelita BROCKS, MD;  Location: [facility];  Service: General;  Laterality: Left;   NECK SURGERY     Mass removed   REDUCTION MAMMAPLASTY Bilateral 2002   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   XI ROBOTIC ASSISTED  HIATAL HERNIA REPAIR N/A 11/18/2022   Procedure: ROBOTIC HIATAL HERNIA REPAIR;  Surgeon: Lyndel Deward PARAS, MD;  Location: [facility];  Service: General;  Laterality: N/A;    Her Family History Is Significant For: Family History  Problem Relation Age of Onset   Bladder Cancer Mother    Allergies Mother    Heart disease Mother    Alzheimer's disease Father    Seizures Sister    Allergies Sister    Migraines Other    Breast cancer Neg Hx    Stroke Neg Hx    Sleep apnea Neg Hx     Her Social History Is Significant For: Social History   Socioeconomic History   Marital status: Married    Spouse name: Not on file    Number of children: 2   Years of education: Not on file   Highest education level: Associate degree: occupational, scientist, product/process development, or vocational program  Occupational History   Occupation: Technical Brewer   Tobacco Use   Smoking status: Former    Current packs/day: 0.00    Average packs/day: 0.3 packs/day for 2.0 years (0.5 ttl pk-yrs)    Types: Cigarettes    Start date: 8    Quit date: 1980    Years since quitting: 46.0   Smokeless tobacco: Never  Vaping Use   Vaping status: Never Used  Substance and Sexual Activity   Alcohol use: No    Alcohol/week: 0.0 standard drinks of alcohol   Drug use: No   Sexual activity: Not on file  Other Topics Concern   Not on file  Social History Narrative   She is a retired Therapist, Occupational.  She is married with 2 daughters and 4 grandchildren, all who live locally.  She tries to stay active.   Decaf coffee and decaf tea only ( 1 cup 2-3 times a week)    Social Drivers of Health   Tobacco Use: Medium Risk (08/11/2024)   Patient History    Smoking Tobacco Use: Former    Smokeless Tobacco Use: Never    Passive Exposure: Not on file  Financial Resource Strain: Low Risk (05/02/2024)   Overall Financial Resource Strain (CARDIA)    Difficulty of Paying Living Expenses: Not hard at all  Food Insecurity: No Food Insecurity (05/02/2024)   Epic    Worried About Radiation Protection Practitioner of Food in the Last Year: Never true    Ran Out of Food in the Last Year: Never true  Transportation Needs: No Transportation Needs (05/02/2024)   Epic    Lack of Transportation (Medical): No    Lack of Transportation (Non-Medical): No  Physical Activity: Inactive (05/02/2024)   Exercise Vital Sign    Days of Exercise per Week: 0 days    Minutes of Exercise per Session: Not on file  Stress: No Stress Concern Present (05/02/2024)   [employer] of Occupational Health - Occupational Stress Questionnaire    Feeling of Stress: Only a little  Social Connections: Socially  Integrated (05/02/2024)   Social Connection and Isolation Panel    Frequency of Communication with Friends and Family: Twice a week    Frequency of Social Gatherings with Friends and Family: Twice a week    Attends Religious Services: More than 4 times per year    Active Member of Clubs or Organizations: Yes    Attends Banker Meetings: 1 to 4 times per year    Marital Status: Married  Depression (PHQ2-9): Low Risk (06/08/2024)   Depression (PHQ2-9)  PHQ-2 Score: 0  Alcohol Screen: Low Risk (01/26/2024)   Alcohol Screen    Last Alcohol Screening Score (AUDIT): 0  Housing: Low Risk (05/02/2024)   Epic    Unable to Pay for Housing in the Last Year: No    Number of Times Moved in the Last Year: 0    Homeless in the Last Year: No  Utilities: Not At Risk (01/26/2024)   Epic    Threatened with loss of utilities: No  Health Literacy: Adequate Health Literacy (01/26/2024)   B1300 Health Literacy    Frequency of need for help with medical instructions: Never    Her Allergies Are:  Allergies[1]:   Her Current Medications Are:  Outpatient Encounter Medications as of 08/11/2024  Medication Sig   acetaminophen  (TYLENOL ) 500 MG tablet Take 1,000 mg by mouth every 6 (six) hours as needed for moderate pain.   albuterol  (VENTOLIN  HFA) 108 (90 Base) MCG/ACT inhaler TAKE 2 PUFFS BY MOUTH EVERY 6 HOURS AS NEEDED FOR WHEEZE OR SHORTNESS OF BREATH   Biotin 10 MG CAPS Take 10 mg by mouth daily.   budesonide -formoterol  (SYMBICORT ) 80-4.5 MCG/ACT inhaler Take 2 puffs first thing in am and then another 2 puffs about 12 hours later.   Calcium  Carbonate (CALCIUM  500 PO) Take 500 mg by mouth daily.   Cholecalciferol (VITAMIN D3) 125 MCG (5000 UT) capsule Take 5,000 Units by mouth daily.   diclofenac  Sodium (VOLTAREN ) 1 % GEL Apply 2 g topically 4 (four) times daily as needed (arthritis).   EPINEPHrine  0.3 mg/0.3 mL IJ SOAJ injection Inject 0.3 mg into the muscle as needed for anaphylaxis.    fexofenadine  (ALLEGRA ) 180 MG tablet Take 1 tablet (180 mg total) by mouth daily.   Multiple Vitamin (MULTI-VITAMIN PO) Take 1 tablet by mouth daily.    Polyethylene Glycol 400 (BLINK TEARS OP) Place 1 drop into both eyes daily.   Probiotic Product (PROBIOTIC PO) Take 1 capsule by mouth daily.   SUMAtriptan  (IMITREX ) 100 MG tablet TAKE 1 TABLET AS NEED FOR MIGRAINE. MAY REPEAT IN 2 HOURS IF HEADACHE PERSISTS OR RECURS.   traZODone  (DESYREL ) 150 MG tablet Take 1 tablet (150 mg total) by mouth at bedtime as needed for sleep.   trospium  (SANCTURA ) 20 MG tablet Take 1 tablet (20 mg total) by mouth 2 (two) times daily.   valsartan -hydrochlorothiazide  (DIOVAN -HCT) 320-25 MG tablet Take 1 tablet by mouth daily.   benzonatate  (TESSALON  PERLES) 100 MG capsule Take 1 capsule (100 mg total) by mouth 3 (three) times daily as needed. (Patient not taking: Reported on 08/11/2024)   clotrimazole  (MYCELEX ) 10 MG troche Take 1 tablet (10 mg total) by mouth 5 (five) times daily. (Patient not taking: Reported on 08/11/2024)   rosuvastatin  (CRESTOR ) 20 MG tablet Take 1 tablet (20 mg total) by mouth daily. To replace Atorvastatin  (pt states it was recalled?) (Patient not taking: Reported on 08/11/2024)   traMADol (ULTRAM) 50 MG tablet Take 50 mg by mouth every 6 (six) hours as needed. (Patient not taking: Reported on 08/11/2024)   No facility-administered encounter medications on file as of 08/11/2024.  :   Review of Systems:  Out of a complete 14 point review of systems, all are reviewed and negative with the exception of these symptoms as listed below:     Review of Systems  Objective:  Neurological Exam  Physical Exam Physical Examination:   Vitals:   08/11/24 1008  BP: (!) 142/75  Pulse: 66    General Examination: The patient  is a very pleasant 74 y.o. female in no acute distress. She appears well-developed and well-nourished and well groomed.   HEENT: Normocephalic, atraumatic, pupils are equal, round  and reactive to light, extraocular tracking is good without limitation to gaze excursion or nystagmus noted. No photophobia.  Corrective eye glasses in place. Hearing is grossly intact.  Face is symmetric with normal facial animation. Speech is clear without dysarthria. There is no hypophonia. There is no lip, neck/head, jaw or voice tremor. Neck is supple with full range of passive and active motion. There are no carotid bruits on auscultation.  Airway/Oropharynx exam reveals: mild mouth dryness, adequate dental hygiene and moderate airway crowding, due to small airway entry and redundant soft palate, Mallampati class III, tonsils absent.  Neck circumference 16 1/4 inches.  Significant overbite noted.  Tongue protrudes centrally and palate elevates symmetrically.  Chest: Clear to auscultation without wheezing, rhonchi or crackles noted.  Heart: S1+S2+0, regular and normal without murmurs, rubs or gallops noted.   Abdomen: Soft, non-tender and non-distended.  Extremities: There is mild puffiness noted in the left leg, from the knee on down.  This is not new per patient.   Skin: Warm and dry without trophic changes noted.   Musculoskeletal: exam reveals no obvious joint deformities.   Neurologically:  Mental status: The patient is awake, alert and oriented in all 4 spheres. Her immediate and remote memory, attention, language skills and fund of knowledge are appropriate. There is no evidence of aphasia, agnosia, apraxia or anomia. Speech is clear with normal prosody and enunciation. Thought process is linear. Mood is normal and affect is normal.  Cranial nerves II - XII are as described above under HEENT exam.  Motor exam: Normal bulk, moving all 4 extremities without any obvious restriction, does walk with a slight limp on the left.  No obvious action or resting tremor.  Fine motor skills and coordination: Intact grossly.  Cerebellar testing: No dysmetria or intention tremor. There is no truncal  or gait ataxia.  Sensory exam: intact to light touch in the upper and lower extremities.  Gait, station and balance: She stands without difficulty, walks without a walking aid, slight limp on the left.   Assessment and Plan:   In summary, Ashyra Cantin is a very pleasant 74 y.o.-year old female with an underlying medical history of memory change, hypertension, migraine headache, arthritis, asthma, allergic rhinitis, depression, reflux disease, and obesity, whose history and physical exam are concerning for sleep disordered breathing, particularly obstructive sleep apnea (OSA). A laboratory attended sleep study is typically considered gold standard for evaluation of sleep disordered breathing.   I had a long chat with the patient about my findings and the diagnosis of sleep apnea, particularly OSA, its prognosis and treatment options. We talked about medical/conservative treatments, surgical interventions and non-pharmacological approaches for symptom control. I explained, in particular, the risks and ramifications of untreated moderate to severe OSA, especially with respect to developing cardiovascular disease down the road, including congestive heart failure (CHF), difficult to treat hypertension, cardiac arrhythmias (particularly A-fib), neurovascular complications including TIA, stroke and dementia. Even type 2 diabetes has, in part, been linked to untreated OSA. Symptoms of untreated OSA may include (but may not be limited to) daytime sleepiness, nocturia (i.e. frequent nighttime urination), memory problems, mood irritability and suboptimally controlled or worsening mood disorder such as depression and/or anxiety, lack of energy, lack of motivation, physical discomfort, as well as recurrent headaches, especially morning or nocturnal headaches. We talked about  the importance of maintaining a healthy lifestyle and striving for healthy weight. In addition, we talked about the importance of  striving for and maintaining good sleep hygiene.  She is advised that her daytime somnolence may be in part related to taking trazodone  at night and not getting enough sleep at night with residual drowsiness from the medication, in addition, she also takes melatonin fairly high doses nightly or nearly nightly.  She is advised to talk to you about a potential dose reduction of her trazodone . I recommended a sleep study at this time. I outlined the differences between a laboratory attended sleep study which is considered more comprehensive and accurate over the option of a home sleep test (HST); the latter may lead to underestimation of sleep disordered breathing in some instances and does not help with diagnosing upper airway resistance syndrome and is not accurate enough to diagnose primary central sleep apnea typically. I outlined possible surgical and non-surgical treatment options of OSA, including the use of a positive airway pressure (PAP) device (i.e. CPAP, AutoPAP/APAP or BiPAP in certain circumstances), a custom-made dental device (aka oral appliance, which would require a referral to a specialist dentist or orthodontist typically, and is generally speaking not considered for patients with full dentures or edentulous state), upper airway surgical options, such as traditional UPPP (which is not considered a first-line treatment) or the Inspire device (hypoglossal nerve stimulator, which would involve a referral for consultation with an ENT surgeon, after careful selection, following inclusion criteria - also not first-line treatment). I explained the PAP treatment option to the patient in detail, as this is generally considered first-line treatment.  The patient indicated that she would be willing to try PAP therapy, if the need arises. I explained the importance of being compliant with PAP treatment, not only for insurance purposes but primarily to improve patient's symptoms symptoms, and for the patient's  long term health benefit, including to reduce Her cardiovascular risks longer-term.    We will pick up our discussion about the next steps and treatment options after testing.  We will keep her posted as to the test results by phone call and/or MyChart messaging where possible.  We will plan to follow-up in sleep clinic accordingly as well.  I answered all her questions today and the patient was in agreement.   I encouraged her to call with any interim questions, concerns, problems or updates or email us  through MyChart.  Generally speaking, sleep test authorizations may take up to 2 weeks, sometimes less, sometimes longer, the patient is encouraged to get in touch with us  if they do not hear back from the sleep lab staff directly within the next 2 weeks.  Thank you very much for allowing me to participate in the care of this nice patient. If I can be of any further assistance to you please do not hesitate to call me at [phone].  Sincerely,   True Mar, MD, PhD      [1]  Allergies Allergen Reactions   Lovenox [Enoxaparin Sodium] Anaphylaxis   Moxifloxacin Anaphylaxis and Other (See Comments)   Quinolones Hives and Shortness Of Breath   Mucinex  Sinus-Max [Phenylephrine -Apap-Guaifenesin ] Swelling    Red mucinex  tablet    Sulfamethoxazole-Trimethoprim Other (See Comments)    Unknown reaction

## 2024-08-12 ENCOUNTER — Telehealth: Payer: Self-pay | Admitting: Neurology

## 2024-08-12 NOTE — Telephone Encounter (Signed)
 NPSG [employer] medicare pending.

## 2024-08-15 ENCOUNTER — Ambulatory Visit: Payer: Self-pay

## 2024-08-15 NOTE — Telephone Encounter (Signed)
 FYI Only or Action Required?: FYI only for provider: appointment scheduled on 1.13.26.  Patient was last seen in primary care on 07/19/2024 by Severa Rock HERO, FNP.  Called Nurse Triage reporting Urinary Frequency.  Symptoms began yesterday.  Interventions attempted: Rest, hydration, or home remedies.  Symptoms are: unchanged.  Triage Disposition: See Physician Within 24 Hours  Patient/caregiver understands and will follow disposition?: Yes

## 2024-08-15 NOTE — Telephone Encounter (Signed)
 Appt made

## 2024-08-15 NOTE — Telephone Encounter (Signed)
" ° ° ° °  Reason for Triage: Frequent urination - not able to empty bladder fully  Reason for Disposition  Urinating more frequently than usual (i.e., frequency) OR new-onset of the feeling of an urgent need to urinate (i.e., urgency)  Answer Assessment - Initial Assessment Questions Urinary frequency and decreased output started in the middle of the night. Denies any fever or pain but does state she has chills. Pt scheduled for appt for tomorrow. Gave care advice and when to call back or go to Er. Pt stated understanding.     1. SYMPTOM: What's the main symptom you're concerned about? (e.g., frequency, incontinence)     Frequency. 2. ONSET: When did the  symptoms  start?     Last night 3. PAIN: Is there any pain? If Yes, ask: How bad is it? (Scale: 1-10; mild, moderate, severe)     denies 4. CAUSE: What do you think is causing the symptoms?     unknown 5. OTHER SYMPTOMS: Do you have any other symptoms? (e.g., blood in urine, fever, flank pain, pain with urination)     denies  Protocols used: Urinary Symptoms-A-AH  "

## 2024-08-16 ENCOUNTER — Ambulatory Visit: Admitting: Nurse Practitioner

## 2024-08-18 NOTE — Telephone Encounter (Signed)
 NPSG [employer] medicare shara: [id number] (exp. 08/12/24 to 02/08/25)

## 2024-08-22 NOTE — Telephone Encounter (Signed)
 NPSG- [employer] medicare auth: [id number] (exp. 08/12/24 to 02/08/25)   Patient is scheduled at [facility] for 09/11/24 at 9 pm.

## 2024-09-09 ENCOUNTER — Telehealth: Payer: Self-pay | Admitting: Family Medicine

## 2024-09-09 NOTE — Telephone Encounter (Unsigned)
 Copied from CRM #[id number]. Topic: Clinical - Lab/Test Results >> Sep 09, 2024  9:03 AM Graeme ORN wrote: Reason for CRM: Patient called. States she had her A1C checked yesterday and it was 5.4 Wanted to leave a message for Dr Jolinda to let her know the result. Thank You

## 2024-09-11 ENCOUNTER — Encounter

## 2025-01-26 ENCOUNTER — Ambulatory Visit: Payer: Self-pay

## 2025-01-26 ENCOUNTER — Ambulatory Visit
